# Patient Record
Sex: Female | Born: 1955
Health system: Southern US, Community
[De-identification: ages and names within clinical notes are randomized; demographics above are authoritative.]

## PROBLEM LIST (undated history)

## (undated) DIAGNOSIS — Z973 Presence of spectacles and contact lenses: Secondary | ICD-10-CM

## (undated) DIAGNOSIS — T7840XA Allergy, unspecified, initial encounter: Secondary | ICD-10-CM

## (undated) DIAGNOSIS — M6530 Trigger finger, unspecified finger: Secondary | ICD-10-CM

## (undated) DIAGNOSIS — F32A Depression, unspecified: Secondary | ICD-10-CM

## (undated) DIAGNOSIS — Z9889 Other specified postprocedural states: Secondary | ICD-10-CM

## (undated) DIAGNOSIS — K219 Gastro-esophageal reflux disease without esophagitis: Secondary | ICD-10-CM

## (undated) DIAGNOSIS — M1990 Unspecified osteoarthritis, unspecified site: Secondary | ICD-10-CM

## (undated) DIAGNOSIS — E7800 Pure hypercholesterolemia, unspecified: Secondary | ICD-10-CM

## (undated) DIAGNOSIS — K635 Polyp of colon: Secondary | ICD-10-CM

## (undated) DIAGNOSIS — Z780 Asymptomatic menopausal state: Secondary | ICD-10-CM

## (undated) DIAGNOSIS — F329 Major depressive disorder, single episode, unspecified: Secondary | ICD-10-CM

## (undated) DIAGNOSIS — R112 Nausea with vomiting, unspecified: Secondary | ICD-10-CM

## (undated) HISTORY — PX: COLONOSCOPY: SHX174

## (undated) HISTORY — DX: Pure hypercholesterolemia, unspecified: E78.00

## (undated) HISTORY — DX: Trigger finger, unspecified finger: M65.30

## (undated) HISTORY — DX: Allergy, unspecified, initial encounter: T78.40XA

## (undated) HISTORY — DX: Polyp of colon: K63.5

## (undated) HISTORY — PX: TOTAL ABDOMINAL HYSTERECTOMY: SHX209

## (undated) HISTORY — PX: OTHER SURGICAL HISTORY: SHX169

## (undated) HISTORY — DX: Asymptomatic menopausal state: Z78.0

## (undated) HISTORY — DX: Major depressive disorder, single episode, unspecified: F32.9

## (undated) HISTORY — DX: Depression, unspecified: F32.A

## (undated) HISTORY — PX: WISDOM TOOTH EXTRACTION: SHX21

## (undated) HISTORY — DX: Gastro-esophageal reflux disease without esophagitis: K21.9

---

## 1993-06-23 HISTORY — PX: FOOT ARTHRODESIS: SHX1655

## 1997-07-25 ENCOUNTER — Ambulatory Visit (HOSPITAL_COMMUNITY): Admission: RE | Admit: 1997-07-25 | Discharge: 1997-07-25 | Payer: Self-pay | Admitting: *Deleted

## 1998-03-15 ENCOUNTER — Ambulatory Visit (HOSPITAL_COMMUNITY): Admission: RE | Admit: 1998-03-15 | Discharge: 1998-03-15 | Payer: Self-pay | Admitting: Obstetrics and Gynecology

## 1998-06-04 ENCOUNTER — Ambulatory Visit (HOSPITAL_COMMUNITY): Admission: RE | Admit: 1998-06-04 | Discharge: 1998-06-04 | Payer: Self-pay | Admitting: Obstetrics and Gynecology

## 1998-07-23 ENCOUNTER — Encounter: Payer: Self-pay | Admitting: *Deleted

## 1998-07-23 ENCOUNTER — Inpatient Hospital Stay (HOSPITAL_COMMUNITY): Admission: AD | Admit: 1998-07-23 | Discharge: 1998-07-27 | Payer: Self-pay | Admitting: *Deleted

## 1998-07-27 ENCOUNTER — Encounter (HOSPITAL_COMMUNITY): Admission: RE | Admit: 1998-07-27 | Discharge: 1998-10-25 | Payer: Self-pay | Admitting: Unknown Physician Specialty

## 1998-09-12 ENCOUNTER — Other Ambulatory Visit: Admission: RE | Admit: 1998-09-12 | Discharge: 1998-09-12 | Payer: Self-pay | Admitting: Obstetrics and Gynecology

## 1999-07-01 ENCOUNTER — Ambulatory Visit (HOSPITAL_COMMUNITY): Admission: RE | Admit: 1999-07-01 | Discharge: 1999-07-01 | Payer: Self-pay | Admitting: Internal Medicine

## 1999-08-20 ENCOUNTER — Encounter: Admission: RE | Admit: 1999-08-20 | Discharge: 1999-08-20 | Payer: Self-pay | Admitting: Internal Medicine

## 1999-08-20 ENCOUNTER — Encounter: Payer: Self-pay | Admitting: Internal Medicine

## 1999-08-29 ENCOUNTER — Other Ambulatory Visit: Admission: RE | Admit: 1999-08-29 | Discharge: 1999-08-29 | Payer: Self-pay | Admitting: *Deleted

## 2000-11-24 ENCOUNTER — Other Ambulatory Visit: Admission: RE | Admit: 2000-11-24 | Discharge: 2000-11-24 | Payer: Self-pay | Admitting: *Deleted

## 2001-08-03 ENCOUNTER — Encounter: Payer: Self-pay | Admitting: Internal Medicine

## 2001-08-03 ENCOUNTER — Encounter: Admission: RE | Admit: 2001-08-03 | Discharge: 2001-08-03 | Payer: Self-pay | Admitting: Internal Medicine

## 2002-01-11 ENCOUNTER — Other Ambulatory Visit: Admission: RE | Admit: 2002-01-11 | Discharge: 2002-01-11 | Payer: Self-pay | Admitting: *Deleted

## 2002-09-07 ENCOUNTER — Ambulatory Visit (HOSPITAL_COMMUNITY): Admission: RE | Admit: 2002-09-07 | Discharge: 2002-09-07 | Payer: Self-pay | Admitting: Internal Medicine

## 2002-09-07 ENCOUNTER — Encounter: Payer: Self-pay | Admitting: Internal Medicine

## 2003-01-17 ENCOUNTER — Other Ambulatory Visit: Admission: RE | Admit: 2003-01-17 | Discharge: 2003-01-17 | Payer: Self-pay | Admitting: *Deleted

## 2003-06-24 HISTORY — PX: HYSTERECTOMY ABDOMINAL WITH SALPINGECTOMY: SHX6725

## 2003-06-24 HISTORY — PX: ABDOMINAL HYSTERECTOMY: SHX81

## 2003-08-28 ENCOUNTER — Encounter: Admission: RE | Admit: 2003-08-28 | Discharge: 2003-08-28 | Payer: Self-pay | Admitting: *Deleted

## 2004-01-01 ENCOUNTER — Other Ambulatory Visit: Admission: RE | Admit: 2004-01-01 | Discharge: 2004-01-01 | Payer: Self-pay | Admitting: *Deleted

## 2005-01-20 ENCOUNTER — Other Ambulatory Visit: Admission: RE | Admit: 2005-01-20 | Discharge: 2005-01-20 | Payer: Self-pay | Admitting: *Deleted

## 2005-01-20 ENCOUNTER — Ambulatory Visit (HOSPITAL_COMMUNITY): Admission: RE | Admit: 2005-01-20 | Discharge: 2005-01-20 | Payer: Self-pay | Admitting: *Deleted

## 2005-01-29 ENCOUNTER — Encounter: Admission: RE | Admit: 2005-01-29 | Discharge: 2005-01-29 | Payer: Self-pay | Admitting: *Deleted

## 2005-04-10 ENCOUNTER — Ambulatory Visit: Payer: Self-pay | Admitting: Internal Medicine

## 2005-04-15 ENCOUNTER — Ambulatory Visit: Payer: Self-pay | Admitting: Internal Medicine

## 2005-04-23 ENCOUNTER — Ambulatory Visit: Payer: Self-pay | Admitting: Internal Medicine

## 2006-02-24 ENCOUNTER — Ambulatory Visit (HOSPITAL_COMMUNITY): Admission: RE | Admit: 2006-02-24 | Discharge: 2006-02-24 | Payer: Self-pay | Admitting: *Deleted

## 2006-03-03 ENCOUNTER — Other Ambulatory Visit: Admission: RE | Admit: 2006-03-03 | Discharge: 2006-03-03 | Payer: Self-pay | Admitting: *Deleted

## 2007-03-25 ENCOUNTER — Other Ambulatory Visit: Admission: RE | Admit: 2007-03-25 | Discharge: 2007-03-25 | Payer: Self-pay | Admitting: *Deleted

## 2007-03-25 ENCOUNTER — Ambulatory Visit (HOSPITAL_COMMUNITY): Admission: RE | Admit: 2007-03-25 | Discharge: 2007-03-25 | Payer: Self-pay | Admitting: *Deleted

## 2008-05-05 ENCOUNTER — Ambulatory Visit (HOSPITAL_COMMUNITY): Admission: RE | Admit: 2008-05-05 | Discharge: 2008-05-05 | Payer: Self-pay | Admitting: Gynecology

## 2008-05-15 ENCOUNTER — Telehealth: Payer: Self-pay | Admitting: Internal Medicine

## 2008-05-15 ENCOUNTER — Other Ambulatory Visit: Admission: RE | Admit: 2008-05-15 | Discharge: 2008-05-15 | Payer: Self-pay | Admitting: Gynecology

## 2008-05-16 ENCOUNTER — Ambulatory Visit: Payer: Self-pay | Admitting: Internal Medicine

## 2008-05-25 ENCOUNTER — Telehealth: Payer: Self-pay | Admitting: Internal Medicine

## 2008-05-31 ENCOUNTER — Ambulatory Visit: Payer: Self-pay | Admitting: Internal Medicine

## 2009-03-06 ENCOUNTER — Emergency Department (HOSPITAL_COMMUNITY): Admission: EM | Admit: 2009-03-06 | Discharge: 2009-03-06 | Payer: Self-pay | Admitting: Family Medicine

## 2009-05-09 ENCOUNTER — Ambulatory Visit (HOSPITAL_COMMUNITY): Admission: RE | Admit: 2009-05-09 | Discharge: 2009-05-09 | Payer: Self-pay | Admitting: Gynecology

## 2010-07-21 ENCOUNTER — Ambulatory Visit
Admission: RE | Admit: 2010-07-21 | Discharge: 2010-07-21 | Payer: Self-pay | Source: Home / Self Care | Admitting: Emergency Medicine

## 2010-07-21 DIAGNOSIS — J209 Acute bronchitis, unspecified: Secondary | ICD-10-CM | POA: Insufficient documentation

## 2010-07-31 NOTE — Assessment & Plan Note (Signed)
Summary: COLD/TM room 4   Vital Signs:  Patient Profile:   55 Years Old Female CC:      Cold & URI symptoms Height:     66 inches Weight:      144.50 pounds O2 Sat:      100 % O2 treatment:    Room Air Temp:     98.7 degrees F oral Pulse rate:   81 / minute Resp:     16 per minute BP sitting:   111 / 74  (left arm) Cuff size:   regular  Vitals Entered By: Clemens Catholic LPN (July 21, 2010 11:29 AM)                  Updated Prior Medication List: VIVELLE-DOT 0.0375 MG/24HR PTTW (ESTRADIOL)  CITALOPRAM HYDROBROMIDE 20 MG TABS (CITALOPRAM HYDROBROMIDE)   Current Allergies: ! PENICILLINHistory of Present Illness Chief Complaint: Cold & URI symptoms History of Present Illness: 55 Years Old Female complains of onset of cold symptoms for 12  days.  Joanna Willis has been using Zyrtec which is helping a little bit.  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  Her husband had pneumonia recently. No sore throat + cough No pleuritic pain No wheezing No nasal congestion + post-nasal drainage No sinus pain/pressure + chest congestion No itchy/red eyes No earache No hemoptysis No SOB No chills/sweats No fever No nausea No vomiting No abdominal pain No diarrhea No skin rashes No fatigue No myalgias + headache   REVIEW OF SYSTEMS Constitutional Symptoms       Complains of fever, chills, and night sweats.     Denies weight loss, weight gain, and fatigue.  Eyes       Denies change in vision, eye pain, eye discharge, glasses, contact lenses, and eye surgery. Ear/Nose/Throat/Mouth       Complains of sinus problems and hoarseness.      Denies hearing loss/aids, change in hearing, ear pain, ear discharge, dizziness, frequent runny nose, frequent nose bleeds, sore throat, and tooth pain or bleeding.  Respiratory       Complains of productive cough and bronchitis.      Denies dry cough, wheezing, shortness of breath, asthma, and emphysema/COPD.  Cardiovascular       Complains of tires  easily with exhertion.      Denies murmurs and chest pain.    Gastrointestinal       Denies stomach pain, nausea/vomiting, diarrhea, constipation, blood in bowel movements, and indigestion. Genitourniary       Denies painful urination, kidney stones, and loss of urinary control. Neurological       Complains of headaches.      Denies paralysis, seizures, and fainting/blackouts. Musculoskeletal       Denies muscle pain, joint pain, joint stiffness, decreased range of motion, redness, swelling, muscle weakness, and gout.  Skin       Denies bruising, unusual mles/lumps or sores, and hair/skin or nail changes.  Psych       Denies mood changes, temper/anger issues, anxiety/stress, speech problems, depression, and sleep problems. Other Comments: pt states that she has had a productive cough, HA x 12days. she had a fever last Saturday and Sunday, nofever since then. no sleeping. she is taking zyrtec and Robt   Past History:  Past Medical History: Unremarkable  Past Surgical History: Caesarean section Hysterectomy  Family History: CA CAD  Social History: Never Smoked Alcohol use-yes Drug use-no Smoking Status:  never Drug Use:  no Physical Exam  General appearance: well developed, well nourished, no acute distress other than coughing during the exam Ears: normal, no lesions or deformities Nasal: mucosa pink, nonedematous, no septal deviation, turbinates normal Oral/Pharynx: clear PND, otherwise normal Neck: neck supple,  trachea midline, no masses Chest/Lungs: no rales, wheezes, or rhonchi bilateral, breath sounds equal without effort Heart: regular rate and  rhythm, no murmur Skin: no obvious rashes or lesions MSE: oriented to time, place, and person Assessment New Problems: BRONCHITIS, ACUTE (ICD-466.0)   Patient Education: Patient and/or caregiver instructed in the following: rest, fluids.  Plan New Medications/Changes: ZITHROMAX Z-PAK 250 MG TABS (AZITHROMYCIN) use  as directed  #1 x 0, 07/21/2010, Hoyt Koch MD TESSALON 200 MG CAPS (BENZONATATE) 1 by mouth three times a day as needed cough  #24 x 0, 07/21/2010, Hoyt Koch MD  New Orders: New Patient Level III [phone] Pulse Oximetry (single measurment) [94760] Planning Comments:   1)  Take the prescribed antibiotic as instructed. 2)  Use nasal saline solution (over the counter) at least 3 times a day. 3)  Use over the counter decongestants like Zyrtec-D every 12 hours as needed to help with congestion.  Can also use Delsym or Robitussin if the Tessalon isn't good enough. 4)  Can take tylenol every 6 hours or motrin every 8 hours for pain or fever. 5)  Follow up with your primary doctor  if no improvement in 5-7 days, sooner if increasing pain, fever, or new symptoms.   If getting worse or not better, can try a Rx cough syrup, Prednisone, or get a CXR.   The patient and/or caregiver has been counseled thoroughly with regard to medications prescribed including dosage, schedule, interactions, rationale for use, and possible side effects and they verbalize understanding.  Diagnoses and expected course of recovery discussed and will return if not improved as expected or if the condition worsens. Patient and/or caregiver verbalized understanding.  Prescriptions: ZITHROMAX Z-PAK 250 MG TABS (AZITHROMYCIN) use as directed  #1 x 0   Entered and Authorized by:   Hoyt Koch MD   Signed by:   Hoyt Koch MD on 07/21/2010   Method used:   Print then Give to Patient   RxID:   [id number] TESSALON 200 MG CAPS (BENZONATATE) 1 by mouth three times a day as needed cough  #24 x 0   Entered and Authorized by:   Hoyt Koch MD   Signed by:   Hoyt Koch MD on 07/21/2010   Method used:   Print then Give to Patient   RxID:   [id number]   Orders Added: 1)  New Patient Level III [29528] 2)  Pulse Oximetry (single measurment) [41324]

## 2010-09-27 LAB — POCT URINALYSIS DIP (DEVICE)
Glucose, UA: NEGATIVE mg/dL
Ketones, ur: NEGATIVE mg/dL
Specific Gravity, Urine: 1.015 (ref 1.005–1.030)
Urobilinogen, UA: 0.2 mg/dL (ref 0.0–1.0)
pH: 7 (ref 5.0–8.0)

## 2010-09-27 LAB — URINE CULTURE

## 2013-03-17 ENCOUNTER — Encounter: Payer: Self-pay | Admitting: Internal Medicine

## 2013-11-19 ENCOUNTER — Encounter: Payer: Self-pay | Admitting: Internal Medicine

## 2013-11-28 ENCOUNTER — Encounter: Payer: Self-pay | Admitting: Internal Medicine

## 2014-02-17 ENCOUNTER — Encounter: Payer: Self-pay | Admitting: Internal Medicine

## 2014-03-10 ENCOUNTER — Ambulatory Visit (AMBULATORY_SURGERY_CENTER): Payer: 59

## 2014-03-10 VITALS — Ht 65.5 in | Wt 153.0 lb

## 2014-03-10 DIAGNOSIS — Z800 Family history of malignant neoplasm of digestive organs: Secondary | ICD-10-CM

## 2014-03-10 MED ORDER — MOVIPREP 100 G PO SOLR: 1.0000 | Freq: Once | ORAL | Status: DC

## 2014-03-10 NOTE — Progress Notes (Signed)
No allergies to eggs or soy No past problems with anesthesia EXCEPT PONV No home oxygen No diet/weight loss meds  Has email  Emmi instructions given for colonoscopy

## 2014-03-24 ENCOUNTER — Ambulatory Visit (AMBULATORY_SURGERY_CENTER): Payer: 59 | Admitting: Internal Medicine

## 2014-03-24 ENCOUNTER — Encounter: Payer: Self-pay | Admitting: Internal Medicine

## 2014-03-24 VITALS — BP 116/70 | HR 60 | Temp 96.2°F | Resp 22 | Ht 65.5 in | Wt 153.0 lb

## 2014-03-24 DIAGNOSIS — Z800 Family history of malignant neoplasm of digestive organs: Secondary | ICD-10-CM

## 2014-03-24 DIAGNOSIS — Z1211 Encounter for screening for malignant neoplasm of colon: Secondary | ICD-10-CM

## 2014-03-24 MED ORDER — SODIUM CHLORIDE 0.9 % IV SOLN: 500.0000 mL | INTRAVENOUS | Status: DC

## 2014-03-24 NOTE — Patient Instructions (Signed)
YOU HAD AN ENDOSCOPIC PROCEDURE TODAY AT [facility]: Refer to the procedure report that was given to you for any specific questions about what was found during the examination.  If the procedure report does not answer your questions, please call your gastroenterologist to clarify.  If you requested that your care partner not be given the details of your procedure findings, then the procedure report has been included in a sealed envelope for you to review at your convenience later.  YOU SHOULD EXPECT: Some feelings of bloating in the abdomen. Passage of more gas than usual.  Walking can help get rid of the air that was put into your GI tract during the procedure and reduce the bloating. If you had a lower endoscopy (such as a colonoscopy or flexible sigmoidoscopy) you may notice spotting of blood in your stool or on the toilet paper. If you underwent a bowel prep for your procedure, then you may not have a normal bowel movement for a few days.  DIET: Your first meal following the procedure should be a light meal and then it is ok to progress to your normal diet.  A half-sandwich or bowl of soup is an example of a good first meal.  Heavy or fried foods are harder to digest and may make you feel nauseous or bloated.  Likewise meals heavy in dairy and vegetables can cause extra gas to form and this can also increase the bloating.  Drink plenty of fluids but you should avoid alcoholic beverages for 24 hours.Try to eat a high fiber diet.  ACTIVITY: Your care partner should take you home directly after the procedure.  You should plan to take it easy, moving slowly for the rest of the day.  You can resume normal activity the day after the procedure however you should NOT DRIVE or use heavy machinery for 24 hours (because of the sedation medicines used during the test).    SYMPTOMS TO REPORT IMMEDIATELY: A gastroenterologist can be reached at any hour.  During normal business hours, 8:30 AM to 5:00  PM Monday through Friday, call [phone].  After hours and on weekends, please call the GI answering service at [phone] who will take a message and have the physician on call contact you.   Following lower endoscopy (colonoscopy or flexible sigmoidoscopy):  Excessive amounts of blood in the stool  Significant tenderness or worsening of abdominal pains  Swelling of the abdomen that is new, acute  Fever of 100F or higher  FOLLOW UP: If any biopsies were taken you will be contacted by phone or by letter within the next 1-3 weeks.  Call your gastroenterologist if you have not heard about the biopsies in 3 weeks.  Our staff will call the home number listed on your records the next business day following your procedure to check on you and address any questions or concerns that you may have at that time regarding the information given to you following your procedure. This is a courtesy call and so if there is no answer at the home number and we have not heard from you through the emergency physician on call, we will assume that you have returned to your regular daily activities without incident.  SIGNATURES/CONFIDENTIALITY: You and/or your care partner have signed paperwork which will be entered into your electronic medical record.  These signatures attest to the fact that that the information above on your After Visit Summary has been reviewed and is understood.  Full  responsibility of the confidentiality of this discharge information lies with you and/or your care-partner.

## 2014-03-24 NOTE — Progress Notes (Signed)
Report to PACU, RN, vss, BBS= Clear.  

## 2014-03-24 NOTE — Op Note (Signed)
[place]  [address] [place], [id number]   COLONOSCOPY PROCEDURE REPORT  PATIENT: Joanna Willis, Joanna Willis  MR#: [id number] BIRTHDATE: 04-23-1956 , 89  yrs. old GENDER: female ENDOSCOPIST: Lafayette Dragon, MD REFERRED BY:Dr Gildardo Cranker PROCEDURE DATE:  03/24/2014 PROCEDURE:   Colonoscopy, screening First Screening Colonoscopy - Avg.  risk and is 50 yrs.  old or older - No.  Prior Negative Screening - Now for repeat screening. 10 or more years since last screening Prior Negative Screening - Now for repeat screening.  Less than 10 yrs Prior Negative Screening - Now for repeat screening.  Above average risk  History of Adenoma - Now for follow-up colonoscopy & has been > or = to 3 yrs.  N/A  Polyps Removed Today? No. ASA CLASS:   Class I INDICATIONS:patient's immediate family history of colon cancer and mother with colon cancer.  Prior colonoscopies in 1995, 2001 and 2009. MEDICATIONS: Monitored anesthesia care and Propofol 400 mg IV  DESCRIPTION OF PROCEDURE:   After the risks benefits and alternatives of the procedure were thoroughly explained, informed consent was obtained.  The digital rectal exam revealed no abnormalities of the rectum.   The LB [id number]  endoscope was introduced through the anus and advanced to the cecum, which was identified by both the appendix and ileocecal valve. No adverse events experienced.   The quality of the prep was good, using MoviPrep  The instrument was then slowly withdrawn as the colon was fully examined.      COLON FINDINGS: A normal appearing cecum, ileocecal valve, and appendiceal orifice were identified.  The ascending, transverse, descending, sigmoid colon, and rectum appeared unremarkable. Retroflexed views revealed no abnormalities. The time to cecum=27 minutes 08 seconds.  Withdrawal time=603 minutes 0 seconds.  The scope was withdrawn and the procedure completed. COMPLICATIONS: There were no immediate  complications.  ENDOSCOPIC IMPRESSION: Normal colonoscopy  RECOMMENDATIONS: High fiber diet Recall colonoscopy in 5 years  eSigned:  Lafayette Dragon, MD 03/24/2014 10:39 AM   cc:   PATIENT NAME:  Denim, Start MR#: [id number]

## 2014-03-27 ENCOUNTER — Telehealth: Payer: Self-pay | Admitting: *Deleted

## 2014-03-27 NOTE — Telephone Encounter (Signed)
  Follow up Call-  Call back number 03/24/2014  Post procedure Call Back phone  # [phone]  Permission to leave phone message Yes     Patient questions:  Do you have a fever, pain , or abdominal swelling? No. Pain Score  0 *  Have you tolerated food without any problems? Yes.    Have you been able to return to your normal activities? Yes.    Do you have any questions about your discharge instructions: Diet   No. Medications  No. Follow up visit  No.  Do you have questions or concerns about your Care? No.  Actions: * If pain score is 4 or above: No action needed, pain <4.

## 2014-04-14 ENCOUNTER — Encounter (HOSPITAL_BASED_OUTPATIENT_CLINIC_OR_DEPARTMENT_OTHER): Payer: Self-pay | Admitting: Emergency Medicine

## 2014-04-14 ENCOUNTER — Emergency Department (HOSPITAL_BASED_OUTPATIENT_CLINIC_OR_DEPARTMENT_OTHER)
Admission: EM | Admit: 2014-04-14 | Discharge: 2014-04-14 | Disposition: A | Discharge status: Home / Self Care | Payer: 59 | Attending: Emergency Medicine | Admitting: Emergency Medicine

## 2014-04-14 DIAGNOSIS — Z880 Allergy status to penicillin: Secondary | ICD-10-CM | POA: Insufficient documentation

## 2014-04-14 DIAGNOSIS — Z780 Asymptomatic menopausal state: Secondary | ICD-10-CM | POA: Insufficient documentation

## 2014-04-14 DIAGNOSIS — Y9389 Activity, other specified: Secondary | ICD-10-CM | POA: Insufficient documentation

## 2014-04-14 DIAGNOSIS — S8992XA Unspecified injury of left lower leg, initial encounter: Secondary | ICD-10-CM | POA: Diagnosis present

## 2014-04-14 DIAGNOSIS — E780 Pure hypercholesterolemia: Secondary | ICD-10-CM | POA: Diagnosis not present

## 2014-04-14 DIAGNOSIS — Y9289 Other specified places as the place of occurrence of the external cause: Secondary | ICD-10-CM | POA: Diagnosis not present

## 2014-04-14 DIAGNOSIS — X58XXXA Exposure to other specified factors, initial encounter: Secondary | ICD-10-CM | POA: Diagnosis not present

## 2014-04-14 DIAGNOSIS — F329 Major depressive disorder, single episode, unspecified: Secondary | ICD-10-CM | POA: Insufficient documentation

## 2014-04-14 DIAGNOSIS — K219 Gastro-esophageal reflux disease without esophagitis: Secondary | ICD-10-CM | POA: Diagnosis not present

## 2014-04-14 DIAGNOSIS — Z79899 Other long term (current) drug therapy: Secondary | ICD-10-CM | POA: Insufficient documentation

## 2014-04-14 DIAGNOSIS — S86912A Strain of unspecified muscle(s) and tendon(s) at lower leg level, left leg, initial encounter: Secondary | ICD-10-CM | POA: Diagnosis not present

## 2014-04-14 MED ORDER — HYDROCODONE-ACETAMINOPHEN 5-325 MG PO TABS: ORAL_TABLET | ORAL | Status: DC

## 2014-04-14 MED ORDER — METHOCARBAMOL 500 MG PO TABS: 1000.0000 mg | ORAL_TABLET | Freq: Four times a day (QID) | ORAL | Status: DC | PRN

## 2014-04-14 NOTE — ED Notes (Signed)
Left knee pain. This am her leg gave out while walking.

## 2014-04-14 NOTE — ED Provider Notes (Signed)
CSN: [id number]     Arrival date & time 04/14/14  1134 History   First MD Initiated Contact with Patient 04/14/14 1214     Chief Complaint  Patient presents with  . Knee Pain     (Consider location/radiation/quality/duration/timing/severity/associated sxs/prior Treatment) HPI  Joanna Willis is a 58 y.o. female complaining of acute onset of left knee pain. Patient states that she has issues with her niece, she no longer drugs because they swell and give her pain when she does. She was at her daughter's ray several days ago and was jogging to see her at various weight points. She had severe pain after the fact. She was ambulating with a cane. She's taking Motrin and icing it with little relief. Patient was walking on uneven ground today and while she was standing she heard a pop in the medial posterior area, felt severe pain she's been nonambulatory since that time. No pain medication taken prior to arrival. She did not fall. There's been no direct trauma. She denies numbness, weakness or paresthesia. Pain is severe, 8/10 it is exacerbated when going from sitting to standing and straightening the knee. No prior history of trauma or surgery to the affected joint.  Past Medical History  Diagnosis Date  . GERD (gastroesophageal reflux disease)   . Hypercholesteremia   . Depression   . Postmenopausal    Past Surgical History  Procedure Laterality Date  . Abdominal hysterectomy      did not remove ovaries  . Cesarean section     Family History  Problem Relation Age of Onset  . Pancreatic cancer Neg Hx   . Stomach cancer Neg Hx   . Colon cancer Mother 25    lived 6 months after diagnosis   History  Substance Use Topics  . Smoking status: Never Smoker   . Smokeless tobacco: Never Used  . Alcohol Use: 4.2 oz/week    7 Cans of beer per week   OB History   Grav Para Term Preterm Abortions TAB SAB Ect Mult Living                 Review of Systems  10 systems reviewed and found to  be negative, except as noted in the HPI.  Allergies  Penicillins  Home Medications   Prior to Admission medications   Medication Sig Start Date End Date Taking? Authorizing Provider  citalopram (CELEXA) 20 MG tablet Take 20 mg by mouth daily.    Historical Provider, MD  estradiol (VIVELLE-DOT) 0.05 MG/24HR patch Place 1 patch onto the skin 2 (two) times a week.    Historical Provider, MD  HYDROcodone-acetaminophen (NORCO/VICODIN) 5-325 MG per tablet Take 1-2 tablets by mouth every 6 hours as needed for pain. 04/14/14   Kamariyah Timberlake, PA-C  methocarbamol (ROBAXIN) 500 MG tablet Take 2 tablets (1,000 mg total) by mouth 4 (four) times daily as needed (Pain). 04/14/14   Simone Tuckey, PA-C  omeprazole (PRILOSEC) 20 MG capsule Take 20 mg by mouth daily.    Historical Provider, MD  simvastatin (ZOCOR) 10 MG tablet Take 10 mg by mouth daily at 6 PM.    Historical Provider, MD   BP 126/81  Pulse 69  Temp(Src) 98.6 F (37 C) (Oral)  Resp 18  Ht 5\' 6"  (1.676 m)  Wt 153 lb (69.4 kg)  BMI 24.71 kg/m2  SpO2 100% Physical Exam  Nursing note and vitals reviewed. Constitutional: She is oriented to person, place, and time. She appears well-developed and well-nourished.  No distress.  HENT:  Head: Normocephalic.  Eyes: Conjunctivae and EOM are normal.  Cardiovascular: Normal rate.   Pulmonary/Chest: Effort normal. No stridor.  Musculoskeletal: Normal range of motion.       Legs: Right knee:  No deformity, erythema or abrasions. FROM. No effusion or crepitance. Anterior and posterior drawer show no abnormal laxity. Stable to valgus and varus stress. Joint lines are non-tender. Neurovascularly intact. Pt ambulates with non-antalgic gait.    Neurological: She is alert and oriented to person, place, and time.  Psychiatric: She has a normal mood and affect.    ED Course  Procedures (including critical care time) Labs Review Labs Reviewed - No data to display  Imaging Review No results  found.   EKG Interpretation None      MDM   Final diagnoses:  Knee strain, left, initial encounter    Filed Vitals:   04/14/14 1157  BP: 126/81  Pulse: 69  Temp: 98.6 F (37 C)  TempSrc: Oral  Resp: 18  Height: 5\' 6"  (1.676 m)  Weight: 153 lb (69.4 kg)  SpO2: 100%    Joanna Willis is a 58 y.o. female presenting with acute onset of posterior medial left knee pain. Patient has excellent active range of motion. The knee joint itself appears intact and stable, there was no trauma, no indication for imaging at this point. She has a torn muscle at the insertion of the posterior thigh musculature. Patient will be put in an immobilizer, recommended NSAIDs, muscle relaxers and narcotics for pain control. Patient declines pain medication here.  Evaluation does not show pathology that would require ongoing emergent intervention or inpatient treatment. Pt is hemodynamically stable and mentating appropriately. Discussed findings and plan with patient/guardian, who agrees with care plan. All questions answered. Return precautions discussed and outpatient follow up given.   New Prescriptions   HYDROCODONE-ACETAMINOPHEN (NORCO/VICODIN) 5-325 MG PER TABLET    Take 1-2 tablets by mouth every 6 hours as needed for pain.   METHOCARBAMOL (ROBAXIN) 500 MG TABLET    Take 2 tablets (1,000 mg total) by mouth 4 (four) times daily as needed (Pain).         Monico Blitz, PA-C 04/14/14 1258

## 2014-04-14 NOTE — Discharge Instructions (Signed)
Rest, Ice intermittently (in the first 24-48 hours), Gentle compression with an Ace wrap, and elevate (Limb above the level of the heart)   Take up to 800mg  of ibuprofen (that is usually 4 over the counter pills)  3 times a day for 5 days. Take with food.  Take robaxin and/or Vicodin for breakthrough pain, do not drink alcohol, drive, care for children or perfom other critical tasks while taking robaxin and/or Vicodin .  Please follow with your primary care doctor in the next 2 days for a check-up. They must obtain records for further management.   Do not hesitate to return to the Emergency Department for any new, worsening or concerning symptoms.

## 2014-04-15 NOTE — ED Provider Notes (Signed)
Medical screening examination/treatment/procedure(s) were performed by non-physician practitioner and as supervising physician I was immediately available for consultation/collaboration.   EKG Interpretation None        Fredia Sorrow, MD 04/15/14 1524

## 2014-04-17 ENCOUNTER — Ambulatory Visit (INDEPENDENT_AMBULATORY_CARE_PROVIDER_SITE_OTHER): Payer: 59 | Admitting: Family Medicine

## 2014-04-17 ENCOUNTER — Encounter: Payer: Self-pay | Admitting: Family Medicine

## 2014-04-17 VITALS — BP 123/80 | HR 71 | Ht 66.0 in | Wt 145.0 lb

## 2014-04-17 DIAGNOSIS — M25562 Pain in left knee: Secondary | ICD-10-CM

## 2014-04-17 NOTE — Patient Instructions (Signed)
You have strained your medial hamstring tendon. Start with home exercises 3 sets of 10 once a day - wait about a week to do the running lunge one though. Add ankle weight if these become too easy. Icing 15 minutes at a time 3-4 times a day. Ibuprofen 600mg  three times a day with food OR aleve 2 tabs twice a day with food for pain and inflammation. Knee sleeve for support, compression when up and walking around. Consider physical therapy - call me in a couple weeks if you want to do this. Follow up with me in 1 month.

## 2014-04-19 ENCOUNTER — Encounter: Payer: Self-pay | Admitting: Family Medicine

## 2014-04-19 DIAGNOSIS — M25562 Pain in left knee: Secondary | ICD-10-CM | POA: Insufficient documentation

## 2014-04-19 NOTE — Progress Notes (Signed)
Patient ID: PUANANI GENE, female   DOB: 08/11/1955, 58 y.o.   MRN: [id number]  PCP: Gildardo Cranker, MD  Subjective:   HPI: Patient is a 58 y.o. female here for left knee pain.  Patient reports on 10/21 she was running to different locations at a cross country meet to watch her daughter. No acute injury with this. Then later that night she cleaned up dishes, sat don for 20 minutes and had a lot of pain in left knee, difficulty bearing weight. Pain medial posterior part of left knee. Did feel/hear a snap on Friday on inside of knee in this area. No bruising or swelling. Has been icing and taking advil. No catching, locking, giving out.  Past Medical History  Diagnosis Date  . GERD (gastroesophageal reflux disease)   . Hypercholesteremia   . Depression   . Postmenopausal     Current Outpatient Prescriptions on File Prior to Visit  Medication Sig Dispense Refill  . citalopram (CELEXA) 20 MG tablet Take 20 mg by mouth daily.      Marland Kitchen estradiol (VIVELLE-DOT) 0.05 MG/24HR patch Place 1 patch onto the skin 2 (two) times a week.      Marland Kitchen HYDROcodone-acetaminophen (NORCO/VICODIN) 5-325 MG per tablet Take 1-2 tablets by mouth every 6 hours as needed for pain.  15 tablet  0  . methocarbamol (ROBAXIN) 500 MG tablet Take 2 tablets (1,000 mg total) by mouth 4 (four) times daily as needed (Pain).  20 tablet  0  . omeprazole (PRILOSEC) 20 MG capsule Take 20 mg by mouth daily.      . simvastatin (ZOCOR) 10 MG tablet Take 10 mg by mouth daily at 6 PM.       No current facility-administered medications on file prior to visit.    Past Surgical History  Procedure Laterality Date  . Abdominal hysterectomy      did not remove ovaries  . Cesarean section      Allergies  Allergen Reactions  . Penicillins     REACTION: rash    History   Social History  . Marital Status: Married    Spouse Name: N/A    Number of Children: N/A  . Years of Education: N/A   Occupational History  . Not on file.    Social History Main Topics  . Smoking status: Never Smoker   . Smokeless tobacco: Never Used  . Alcohol Use: 4.2 oz/week    7 Cans of beer per week  . Drug Use: No  . Sexual Activity: Not on file   Other Topics Concern  . Not on file   Social History Narrative  . No narrative on file    Family History  Problem Relation Age of Onset  . Pancreatic cancer Neg Hx   . Stomach cancer Neg Hx   . Colon cancer Mother 27    lived 6 months after diagnosis    BP 123/80  Pulse 71  Ht 5\' 6"  (1.676 m)  Wt 145 lb (65.772 kg)  BMI 23.41 kg/m2  Review of Systems: See HPI above.    Objective:  Physical Exam:  Gen: NAD  Left knee: No gross deformity, ecchymoses, swelling. TTP medial hamstring tendon near insertion.  No other tenderness. FROM - pain with resisted hamstring flexion at 30 degrees with 4+/5 strength compared to right. Negative ant/post drawers. Negative valgus/varus testing. Negative lachmanns. Negative mcmurrays, apleys, patellar apprehension. NV intact distally.    Assessment & Plan:  1. Left knee pain - consistent with  strain of medial hamstring tendon.  Start with home exercise program.  Icing, nsaids.  Sleeve for support.  Consider physical therapy if not improving as expected.  F/u in 1 month.

## 2014-04-19 NOTE — Assessment & Plan Note (Signed)
consistent with strain of medial hamstring tendon.  Start with home exercise program.  Icing, nsaids.  Sleeve for support.  Consider physical therapy if not improving as expected.  F/u in 1 month.

## 2014-04-26 ENCOUNTER — Encounter: Payer: Self-pay | Admitting: Family Medicine

## 2014-04-26 ENCOUNTER — Ambulatory Visit (INDEPENDENT_AMBULATORY_CARE_PROVIDER_SITE_OTHER): Payer: 59 | Admitting: Family Medicine

## 2014-04-26 VITALS — BP 145/91 | HR 74 | Ht 66.0 in | Wt 145.0 lb

## 2014-04-26 DIAGNOSIS — M25562 Pain in left knee: Secondary | ICD-10-CM

## 2014-04-26 MED ORDER — METHYLPREDNISOLONE ACETATE 40 MG/ML IJ SUSP
40.0000 mg | Freq: Once | INTRAMUSCULAR | Status: AC
  Administered 2014-04-26: 40 mg via INTRA_ARTICULAR

## 2014-04-28 NOTE — Progress Notes (Addendum)
Patient ID: Joanna Willis, female   DOB: 04/21/1956, 58 y.o.   MRN: [id number]  PCP: Gildardo Cranker, MD  Subjective:   HPI: Patient is a 58 y.o. female here for left knee pain.  10/26: Patient reports on 10/21 she was running to different locations at a cross country meet to watch her daughter. No acute injury with this. Then later that night she cleaned up dishes, sat don for 20 minutes and had a lot of pain in left knee, difficulty bearing weight. Pain medial posterior part of left knee. Did feel/hear a snap on Friday on inside of knee in this area. No bruising or swelling. Has been icing and taking advil. No catching, locking, giving out.  11/4: Patient reports her pain has continued and bothering below knee.   Pain up to 5/10. Hurts with ambulation mainly. + swelling. Icing and taking advil, wearing knee sleeve.  Past Medical History  Diagnosis Date  . GERD (gastroesophageal reflux disease)   . Hypercholesteremia   . Depression   . Postmenopausal     Current Outpatient Prescriptions on File Prior to Visit  Medication Sig Dispense Refill  . citalopram (CELEXA) 20 MG tablet Take 20 mg by mouth daily.    Marland Kitchen estradiol (VIVELLE-DOT) 0.05 MG/24HR patch Place 1 patch onto the skin 2 (two) times a week.    Marland Kitchen HYDROcodone-acetaminophen (NORCO/VICODIN) 5-325 MG per tablet Take 1-2 tablets by mouth every 6 hours as needed for pain. 15 tablet 0  . methocarbamol (ROBAXIN) 500 MG tablet Take 2 tablets (1,000 mg total) by mouth 4 (four) times daily as needed (Pain). 20 tablet 0  . omeprazole (PRILOSEC) 20 MG capsule Take 20 mg by mouth daily.    . simvastatin (ZOCOR) 10 MG tablet Take 10 mg by mouth daily at 6 PM.     No current facility-administered medications on file prior to visit.    Past Surgical History  Procedure Laterality Date  . Abdominal hysterectomy      did not remove ovaries  . Cesarean section      Allergies  Allergen Reactions  . Penicillins     REACTION:  rash    History   Social History  . Marital Status: Married    Spouse Name: N/A    Number of Children: N/A  . Years of Education: N/A   Occupational History  . Not on file.   Social History Main Topics  . Smoking status: Never Smoker   . Smokeless tobacco: Never Used  . Alcohol Use: 4.2 oz/week    7 Cans of beer per week  . Drug Use: No  . Sexual Activity: Not on file   Other Topics Concern  . Not on file   Social History Narrative    Family History  Problem Relation Age of Onset  . Pancreatic cancer Neg Hx   . Stomach cancer Neg Hx   . Colon cancer Mother 28    lived 6 months after diagnosis    BP 145/91 mmHg  Pulse 74  Ht 5\' 6"  (1.676 m)  Wt 145 lb (65.772 kg)  BMI 23.41 kg/m2  Review of Systems: See HPI above.    Objective:  Physical Exam:  Gen: NAD  Left knee: Mild effusion.  No other gross deformity, ecchymoses. TTP medial joint line.  No longer with TTP hamstring tendon near insertion.  No other tenderness. FROM - no pain with resisted hamstring flexion at 30 and 90 degrees. Negative ant/post drawers. Negative valgus/varus testing. Negative lachmanns.  Negative mcmurrays, apleys, patellar apprehension. NV intact distally.    Assessment & Plan:  1. Left knee pain - consistent with likely flare of arthritis vs degenerative medial meniscus tear.  Both treated similarly initially.  Discussed tylenol, nsaids, capsaicin, glucosamine.  Injection given today.  Discussed HEP.  Consider physical therapy, radiographs with MRI if not improving.  F/u in 1 month to 6 weeks.  After informed written consent, patient was lying supine on exam table. Left knee was prepped with alcohol swab and utilizing superolateral approach with ultrasound guidance, patient's left knee was injected intraarticularly with 3:1 marcaine: depomedrol. Patient tolerated the procedure well without immediate complications.  Addendum:  MRI reviewed and discussed with patient.  She does have a  medial meniscus tear with extrusion, also with some small loose bodies.  Given lack of response to conservative measures and degree of discomfort she is in with these findings will refer to Dr. Berenice Primas to likely undergo arthroscopic debridement.

## 2014-04-28 NOTE — Assessment & Plan Note (Signed)
consistent with likely flare of arthritis vs degenerative medial meniscus tear.  Both treated similarly initially.  Discussed tylenol, nsaids, capsaicin, glucosamine.  Injection given today.  Discussed HEP.  Consider physical therapy, radiographs with MRI if not improving.  F/u in 1 month to 6 weeks.  After informed written consent, patient was lying supine on exam table. Left knee was prepped with alcohol swab and utilizing superolateral approach with ultrasound guidance, patient's left knee was injected intraarticularly with 3:1 marcaine: depomedrol. Patient tolerated the procedure well without immediate complications.

## 2014-05-02 ENCOUNTER — Telehealth: Payer: Self-pay | Admitting: Family Medicine

## 2014-05-02 NOTE — Telephone Encounter (Signed)
Ok to go ahead with MRI of left knee for meniscus tear.  She has [facility].  Thanks!

## 2014-05-02 NOTE — Addendum Note (Signed)
Addended by: Sherrie George F on: 05/02/2014 10:45 AM   Modules accepted: Orders

## 2014-05-06 ENCOUNTER — Ambulatory Visit (HOSPITAL_BASED_OUTPATIENT_CLINIC_OR_DEPARTMENT_OTHER)
Admission: RE | Admit: 2014-05-06 | Discharge: 2014-05-06 | Disposition: A | Discharge status: Home / Self Care | Payer: 59 | Source: Ambulatory Visit | Attending: Family Medicine | Admitting: Family Medicine

## 2014-05-06 DIAGNOSIS — M25462 Effusion, left knee: Secondary | ICD-10-CM | POA: Insufficient documentation

## 2014-05-06 DIAGNOSIS — S83222A Peripheral tear of medial meniscus, current injury, left knee, initial encounter: Secondary | ICD-10-CM | POA: Insufficient documentation

## 2014-05-06 DIAGNOSIS — M179 Osteoarthritis of knee, unspecified: Secondary | ICD-10-CM | POA: Insufficient documentation

## 2014-05-06 DIAGNOSIS — M67462 Ganglion, left knee: Secondary | ICD-10-CM | POA: Insufficient documentation

## 2014-05-06 DIAGNOSIS — M25562 Pain in left knee: Secondary | ICD-10-CM

## 2014-05-06 DIAGNOSIS — M2342 Loose body in knee, left knee: Secondary | ICD-10-CM | POA: Diagnosis not present

## 2014-05-06 DIAGNOSIS — M7122 Synovial cyst of popliteal space [Baker], left knee: Secondary | ICD-10-CM | POA: Diagnosis not present

## 2014-05-06 DIAGNOSIS — M6752 Plica syndrome, left knee: Secondary | ICD-10-CM | POA: Diagnosis not present

## 2014-05-06 DIAGNOSIS — S83207A Unspecified tear of unspecified meniscus, current injury, left knee, initial encounter: Secondary | ICD-10-CM | POA: Diagnosis present

## 2014-05-06 DIAGNOSIS — R609 Edema, unspecified: Secondary | ICD-10-CM | POA: Diagnosis not present

## 2014-05-08 NOTE — Addendum Note (Signed)
Addended by: Sherrie George F on: 05/08/2014 04:13 PM   Modules accepted: Orders

## 2014-05-12 ENCOUNTER — Encounter (HOSPITAL_BASED_OUTPATIENT_CLINIC_OR_DEPARTMENT_OTHER): Payer: Self-pay | Admitting: *Deleted

## 2014-05-12 NOTE — Progress Notes (Signed)
No labs needed-nurse at [facility]

## 2014-05-15 ENCOUNTER — Other Ambulatory Visit: Payer: Self-pay | Admitting: Orthopedic Surgery

## 2014-05-17 ENCOUNTER — Ambulatory Visit (HOSPITAL_BASED_OUTPATIENT_CLINIC_OR_DEPARTMENT_OTHER)
Admission: RE | Admit: 2014-05-17 | Discharge: 2014-05-17 | Disposition: A | Discharge status: Home / Self Care | Payer: 59 | Source: Ambulatory Visit | Attending: Orthopedic Surgery | Admitting: Orthopedic Surgery

## 2014-05-17 ENCOUNTER — Encounter (HOSPITAL_BASED_OUTPATIENT_CLINIC_OR_DEPARTMENT_OTHER)
Admission: RE | Disposition: A | Discharge status: Home / Self Care | Payer: Self-pay | Source: Ambulatory Visit | Attending: Orthopedic Surgery

## 2014-05-17 ENCOUNTER — Encounter (HOSPITAL_BASED_OUTPATIENT_CLINIC_OR_DEPARTMENT_OTHER): Payer: Self-pay | Admitting: Anesthesiology

## 2014-05-17 ENCOUNTER — Ambulatory Visit (HOSPITAL_BASED_OUTPATIENT_CLINIC_OR_DEPARTMENT_OTHER): Payer: 59 | Admitting: Anesthesiology

## 2014-05-17 DIAGNOSIS — M6752 Plica syndrome, left knee: Secondary | ICD-10-CM | POA: Insufficient documentation

## 2014-05-17 DIAGNOSIS — S83242A Other tear of medial meniscus, current injury, left knee, initial encounter: Secondary | ICD-10-CM | POA: Insufficient documentation

## 2014-05-17 DIAGNOSIS — E780 Pure hypercholesterolemia: Secondary | ICD-10-CM | POA: Insufficient documentation

## 2014-05-17 DIAGNOSIS — K219 Gastro-esophageal reflux disease without esophagitis: Secondary | ICD-10-CM | POA: Insufficient documentation

## 2014-05-17 DIAGNOSIS — F329 Major depressive disorder, single episode, unspecified: Secondary | ICD-10-CM | POA: Diagnosis not present

## 2014-05-17 DIAGNOSIS — X58XXXA Exposure to other specified factors, initial encounter: Secondary | ICD-10-CM | POA: Diagnosis not present

## 2014-05-17 DIAGNOSIS — M94262 Chondromalacia, left knee: Secondary | ICD-10-CM | POA: Diagnosis not present

## 2014-05-17 HISTORY — DX: Presence of spectacles and contact lenses: Z97.3

## 2014-05-17 HISTORY — PX: KNEE ARTHROSCOPY WITH MEDIAL MENISECTOMY: SHX5651

## 2014-05-17 LAB — POCT HEMOGLOBIN-HEMACUE: Hemoglobin: 14.5 g/dL (ref 12.0–15.0)

## 2014-05-17 SURGERY — ARTHROSCOPY, KNEE, WITH MEDIAL MENISCECTOMY
Anesthesia: General | Site: Knee | Laterality: Left

## 2014-05-17 MED ORDER — SCOPOLAMINE 1 MG/3DAYS TD PT72
MEDICATED_PATCH | TRANSDERMAL | Status: AC
  Filled 2014-05-17: qty 1

## 2014-05-17 MED ORDER — MIDAZOLAM HCL 2 MG/2ML IJ SOLN
INTRAMUSCULAR | Status: AC
  Filled 2014-05-17: qty 2

## 2014-05-17 MED ORDER — FENTANYL CITRATE 0.05 MG/ML IJ SOLN
INTRAMUSCULAR | Status: AC
  Filled 2014-05-17: qty 6

## 2014-05-17 MED ORDER — MIDAZOLAM HCL 5 MG/5ML IJ SOLN
INTRAMUSCULAR | Status: DC | PRN
  Administered 2014-05-17: 2 mg via INTRAVENOUS

## 2014-05-17 MED ORDER — SCOPOLAMINE 1 MG/3DAYS TD PT72
1.0000 | MEDICATED_PATCH | Freq: Once | TRANSDERMAL | Status: DC
  Administered 2014-05-17: 1.5 mg via TRANSDERMAL

## 2014-05-17 MED ORDER — LIDOCAINE HCL (CARDIAC) 20 MG/ML IV SOLN
INTRAVENOUS | Status: DC | PRN
  Administered 2014-05-17: 50 mg via INTRAVENOUS

## 2014-05-17 MED ORDER — MIDAZOLAM HCL 2 MG/2ML IJ SOLN: 1.0000 mg | INTRAMUSCULAR | Status: DC | PRN

## 2014-05-17 MED ORDER — CLINDAMYCIN PHOSPHATE 900 MG/50ML IV SOLN
INTRAVENOUS | Status: AC
  Filled 2014-05-17: qty 50

## 2014-05-17 MED ORDER — BUPIVACAINE HCL (PF) 0.5 % IJ SOLN
INTRAMUSCULAR | Status: AC
  Filled 2014-05-17: qty 30

## 2014-05-17 MED ORDER — HYDROMORPHONE HCL 1 MG/ML IJ SOLN
INTRAMUSCULAR | Status: AC
  Filled 2014-05-17: qty 1

## 2014-05-17 MED ORDER — LACTATED RINGERS IV SOLN
INTRAVENOUS | Status: DC
  Administered 2014-05-17 (×2): via INTRAVENOUS

## 2014-05-17 MED ORDER — OXYCODONE HCL 5 MG/5ML PO SOLN: 5.0000 mg | Freq: Once | ORAL | Status: DC | PRN

## 2014-05-17 MED ORDER — EPINEPHRINE HCL 1 MG/ML IJ SOLN
INTRAMUSCULAR | Status: DC | PRN
  Administered 2014-05-17: 3000 mL

## 2014-05-17 MED ORDER — CHLORHEXIDINE GLUCONATE 4 % EX LIQD: 60.0000 mL | Freq: Once | CUTANEOUS | Status: DC

## 2014-05-17 MED ORDER — BUPIVACAINE-EPINEPHRINE (PF) 0.5% -1:200000 IJ SOLN
INTRAMUSCULAR | Status: AC
  Filled 2014-05-17: qty 30

## 2014-05-17 MED ORDER — HYDROMORPHONE HCL 1 MG/ML IJ SOLN
0.2500 mg | INTRAMUSCULAR | Status: DC | PRN
  Administered 2014-05-17: 0.25 mg via INTRAVENOUS
  Administered 2014-05-17: 0.5 mg via INTRAVENOUS
  Administered 2014-05-17: 0.25 mg via INTRAVENOUS

## 2014-05-17 MED ORDER — OXYCODONE HCL 5 MG PO TABS: 5.0000 mg | ORAL_TABLET | Freq: Once | ORAL | Status: DC | PRN

## 2014-05-17 MED ORDER — KETOROLAC TROMETHAMINE 30 MG/ML IJ SOLN
INTRAMUSCULAR | Status: DC | PRN
  Administered 2014-05-17: 30 mg via INTRAVENOUS

## 2014-05-17 MED ORDER — DEXAMETHASONE SODIUM PHOSPHATE 10 MG/ML IJ SOLN
INTRAMUSCULAR | Status: DC | PRN
  Administered 2014-05-17: 10 mg via INTRAVENOUS

## 2014-05-17 MED ORDER — CLINDAMYCIN PHOSPHATE 900 MG/50ML IV SOLN: 900.0000 mg | INTRAVENOUS | Status: DC

## 2014-05-17 MED ORDER — EPINEPHRINE HCL 1 MG/ML IJ SOLN
INTRAMUSCULAR | Status: AC
  Filled 2014-05-17: qty 1

## 2014-05-17 MED ORDER — ONDANSETRON HCL 4 MG/2ML IJ SOLN
INTRAMUSCULAR | Status: DC | PRN
  Administered 2014-05-17: 4 mg via INTRAVENOUS

## 2014-05-17 MED ORDER — FENTANYL CITRATE 0.05 MG/ML IJ SOLN
INTRAMUSCULAR | Status: DC | PRN
  Administered 2014-05-17 (×2): 25 ug via INTRAVENOUS
  Administered 2014-05-17: 100 ug via INTRAVENOUS

## 2014-05-17 MED ORDER — BUPIVACAINE HCL (PF) 0.5 % IJ SOLN
INTRAMUSCULAR | Status: DC | PRN
  Administered 2014-05-17: 20 mL via INTRA_ARTICULAR

## 2014-05-17 MED ORDER — CLINDAMYCIN PHOSPHATE 900 MG/50ML IV SOLN
INTRAVENOUS | Status: DC | PRN
  Administered 2014-05-17: 900 mg via INTRAVENOUS

## 2014-05-17 MED ORDER — OXYCODONE-ACETAMINOPHEN 5-325 MG PO TABS: 1.0000 | ORAL_TABLET | ORAL | Status: DC | PRN

## 2014-05-17 MED ORDER — PROPOFOL 10 MG/ML IV BOLUS
INTRAVENOUS | Status: DC | PRN
  Administered 2014-05-17: 200 mg via INTRAVENOUS
  Administered 2014-05-17: 100 mg via INTRAVENOUS

## 2014-05-17 MED ORDER — FENTANYL CITRATE 0.05 MG/ML IJ SOLN: 50.0000 ug | INTRAMUSCULAR | Status: DC | PRN

## 2014-05-17 SURGICAL SUPPLY — 42 items
BANDAGE ELASTIC 6 VELCRO ST LF (GAUZE/BANDAGES/DRESSINGS) ×4 IMPLANT
BLADE 4.2CUDA (BLADE) IMPLANT
BLADE GREAT WHITE 4.2 (BLADE) ×3 IMPLANT
BLADE GREAT WHITE 4.2MM (BLADE) ×1
CANISTER SUCT 3000ML (MISCELLANEOUS) IMPLANT
CUTTER MENISCUS  4.2MM (BLADE)
CUTTER MENISCUS 4.2MM (BLADE) IMPLANT
DRAPE ARTHROSCOPY W/POUCH 114 (DRAPES) ×4 IMPLANT
DRSG EMULSION OIL 3X3 NADH (GAUZE/BANDAGES/DRESSINGS) ×4 IMPLANT
DURAPREP 26ML APPLICATOR (WOUND CARE) ×4 IMPLANT
ELECT MENISCUS 165MM 90D (ELECTRODE) IMPLANT
ELECT REM PT RETURN 9FT ADLT (ELECTROSURGICAL)
ELECTRODE REM PT RTRN 9FT ADLT (ELECTROSURGICAL) IMPLANT
GAUZE SPONGE 4X4 12PLY STRL (GAUZE/BANDAGES/DRESSINGS) ×4 IMPLANT
GLOVE BIOGEL PI IND STRL 7.0 (GLOVE) ×2 IMPLANT
GLOVE BIOGEL PI IND STRL 8 (GLOVE) ×4 IMPLANT
GLOVE BIOGEL PI INDICATOR 7.0 (GLOVE) ×2
GLOVE BIOGEL PI INDICATOR 8 (GLOVE) ×4
GLOVE ECLIPSE 6.5 STRL STRAW (GLOVE) ×4 IMPLANT
GLOVE ECLIPSE 7.5 STRL STRAW (GLOVE) ×8 IMPLANT
GLOVE EXAM NITRILE LRG STRL (GLOVE) ×4 IMPLANT
GOWN STRL REUS W/ TWL LRG LVL3 (GOWN DISPOSABLE) ×2 IMPLANT
GOWN STRL REUS W/ TWL XL LVL3 (GOWN DISPOSABLE) ×2 IMPLANT
GOWN STRL REUS W/TWL LRG LVL3 (GOWN DISPOSABLE) ×4
GOWN STRL REUS W/TWL XL LVL3 (GOWN DISPOSABLE) ×8 IMPLANT
HOLDER KNEE FOAM BLUE (MISCELLANEOUS) ×4 IMPLANT
KNEE WRAP E Z 3 GEL PACK (MISCELLANEOUS) ×4 IMPLANT
MANIFOLD NEPTUNE II (INSTRUMENTS) ×3 IMPLANT
NDL SAFETY ECLIPSE 18X1.5 (NEEDLE) ×1 IMPLANT
NEEDLE HYPO 18GX1.5 SHARP (NEEDLE) ×4
PACK ARTHROSCOPY DSU (CUSTOM PROCEDURE TRAY) ×4 IMPLANT
PACK BASIN DAY SURGERY FS (CUSTOM PROCEDURE TRAY) ×4 IMPLANT
PAD CAST 4YDX4 CTTN HI CHSV (CAST SUPPLIES) ×2 IMPLANT
PADDING CAST COTTON 4X4 STRL (CAST SUPPLIES) ×4
PENCIL BUTTON HOLSTER BLD 10FT (ELECTRODE) IMPLANT
SET ARTHROSCOPY TUBING (MISCELLANEOUS) ×4
SET ARTHROSCOPY TUBING LN (MISCELLANEOUS) ×2 IMPLANT
SUT ETHILON 4 0 PS 2 18 (SUTURE) IMPLANT
SYR 5ML LL (SYRINGE) ×4 IMPLANT
TOWEL OR 17X24 6PK STRL BLUE (TOWEL DISPOSABLE) ×4 IMPLANT
TOWEL OR NON WOVEN STRL DISP B (DISPOSABLE) ×1 IMPLANT
WATER STERILE IRR 1000ML POUR (IV SOLUTION) ×4 IMPLANT

## 2014-05-17 NOTE — Anesthesia Procedure Notes (Signed)
Procedure Name: LMA Insertion Date/Time: 05/17/2014 12:48 PM Performed by: Toula Moos L Pre-anesthesia Checklist: Patient identified, Emergency Drugs available, Suction available, Patient being monitored and Timeout performed Patient Re-evaluated:Patient Re-evaluated prior to inductionOxygen Delivery Method: Circle System Utilized Preoxygenation: Pre-oxygenation with 100% oxygen Intubation Type: IV induction Ventilation: Mask ventilation without difficulty LMA: LMA inserted LMA Size: 4.0 Number of attempts: 1 Airway Equipment and Method: bite block Placement Confirmation: positive ETCO2 Tube secured with: Tape Dental Injury: Teeth and Oropharynx as per pre-operative assessment

## 2014-05-17 NOTE — Transfer of Care (Signed)
Immediate Anesthesia Transfer of Care Note  Patient: Joanna Willis  Procedure(s) Performed: Procedure(s): LEFT ARTHROSCOPY KNEE WITH PARTIAL MEDIAL MENISECTOMY, CHONDROPLASTY OF LATERAL FEMORAL CONDYLE (Left)  Patient Location: PACU  Anesthesia Type:General  Level of Consciousness: alert  and patient cooperative  Airway & Oxygen Therapy: Patient Spontanous Breathing and Patient connected to face mask oxygen  Post-op Assessment: Report given to PACU RN and Post -op Vital signs reviewed and stable  Post vital signs: Reviewed and stable  Complications: No apparent anesthesia complications

## 2014-05-17 NOTE — Anesthesia Preprocedure Evaluation (Addendum)
Anesthesia Evaluation  Patient identified by MRN, date of birth, ID band Patient awake    Reviewed: Allergy & Precautions, H&P , NPO status , Patient's Chart, lab work & pertinent test results  Airway Mallampati: II  TM Distance: >3 FB Neck ROM: Full    Dental no notable dental hx. (+) Teeth Intact, Dental Advisory Given   Pulmonary neg pulmonary ROS,  breath sounds clear to auscultation  Pulmonary exam normal       Cardiovascular negative cardio ROS  Rhythm:Regular Rate:Normal     Neuro/Psych Depression negative neurological ROS  negative psych ROS   GI/Hepatic Neg liver ROS, GERD-  Medicated and Controlled,  Endo/Other  negative endocrine ROS  Renal/GU negative Renal ROS  negative genitourinary   Musculoskeletal   Abdominal   Peds  Hematology negative hematology ROS (+)   Anesthesia Other Findings   Reproductive/Obstetrics negative OB ROS                            Anesthesia Physical Anesthesia Plan  ASA: II  Anesthesia Plan: General   Post-op Pain Management:    Induction: Intravenous  Airway Management Planned: LMA  Additional Equipment:   Intra-op Plan:   Post-operative Plan: Extubation in OR  Informed Consent: I have reviewed the patients History and Physical, chart, labs and discussed the procedure including the risks, benefits and alternatives for the proposed anesthesia with the patient or authorized representative who has indicated his/her understanding and acceptance.   Dental advisory given  Plan Discussed with: CRNA  Anesthesia Plan Comments:         Anesthesia Quick Evaluation

## 2014-05-17 NOTE — H&P (Signed)
PREOPERATIVE H&P  Chief Complaint: l knee pain  HPI: Joanna Willis is a 58 y.o. female who presents for evaluation of l knee pain. It has been present for greater than 3 months and has been worsening. She has failed conservative measures. Pain is rated as moderate.  Past Medical History  Diagnosis Date  . GERD (gastroesophageal reflux disease)   . Hypercholesteremia   . Depression   . Postmenopausal   . Wears glasses    Past Surgical History  Procedure Laterality Date  . Cesarean section  2000  . Abdominal hysterectomy  2005    did not remove ovaries  . Colonoscopy    . Foot arthrodesis  1995    right   History   Social History  . Marital Status: Married    Spouse Name: N/A    Number of Children: N/A  . Years of Education: N/A   Social History Main Topics  . Smoking status: Never Smoker   . Smokeless tobacco: Never Used  . Alcohol Use: 4.2 oz/week    7 Cans of beer per week  . Drug Use: No  . Sexual Activity: None   Other Topics Concern  . None   Social History Narrative   Family History  Problem Relation Age of Onset  . Pancreatic cancer Neg Hx   . Stomach cancer Neg Hx   . Colon cancer Mother 78    lived 6 months after diagnosis   Allergies  Allergen Reactions  . Penicillins     REACTION: rash   Prior to Admission medications   Medication Sig Start Date End Date Taking? Authorizing Provider  citalopram (CELEXA) 20 MG tablet Take 20 mg by mouth daily.   Yes Historical Provider, MD  estradiol (VIVELLE-DOT) 0.05 MG/24HR patch Place 1 patch onto the skin 2 (two) times a week.   Yes Historical Provider, MD  methocarbamol (ROBAXIN) 500 MG tablet Take 2 tablets (1,000 mg total) by mouth 4 (four) times daily as needed (Pain). 04/14/14  Yes Nicole Pisciotta, PA-C  omeprazole (PRILOSEC) 20 MG capsule Take 20 mg by mouth daily.   Yes Historical Provider, MD  simvastatin (ZOCOR) 10 MG tablet Take 10 mg by mouth daily at 6 PM.   Yes Historical Provider, MD   HYDROcodone-acetaminophen (NORCO/VICODIN) 5-325 MG per tablet Take 1-2 tablets by mouth every 6 hours as needed for pain. 04/14/14   Nicole Pisciotta, PA-C     Positive ROS: none  All other systems have been reviewed and were otherwise negative with the exception of those mentioned in the HPI and as above.  Physical Exam: Filed Vitals:   05/17/14 1055  BP: 110/72  Pulse: 54  Temp: 97.8 F (36.6 C)  Resp: 20    General: Alert, no acute distress Cardiovascular: No pedal edema Respiratory: No cyanosis, no use of accessory musculature GI: No organomegaly, abdomen is soft and non-tender Skin: No lesions in the area of chief complaint Neurologic: Sensation intact distally Psychiatric: Patient is competent for consent with normal mood and affect Lymphatic: No axillary or cervical lymphadenopathy  MUSCULOSKELETAL: l knee: +mmed jt line tender// +mcmurray -instability  Assessment/Plan: left knee lateral meniscus tear Plan for Procedure(s): LEFT ARTHROSCOPY KNEE  The risks benefits and alternatives were discussed with the patient including but not limited to the risks of nonoperative treatment, versus surgical intervention including infection, bleeding, nerve injury, malunion, nonunion, hardware prominence, hardware failure, need for hardware removal, blood clots, cardiopulmonary complications, morbidity, mortality, among others, and they were willing  to proceed.  Predicted outcome is good, although there will be at least a six to nine month expected recovery.  Alta Corning, MD 05/17/2014 12:45 PM

## 2014-05-17 NOTE — Discharge Instructions (Signed)

## 2014-05-17 NOTE — Anesthesia Postprocedure Evaluation (Signed)
  Anesthesia Post-op Note  Patient: Joanna Willis  Procedure(s) Performed: Procedure(s): LEFT ARTHROSCOPY KNEE WITH PARTIAL MEDIAL MENISECTOMY, CHONDROPLASTY OF LATERAL FEMORAL CONDYLE (Left)  Patient Location: PACU  Anesthesia Type:General  Level of Consciousness: awake and alert   Airway and Oxygen Therapy: Patient Spontanous Breathing  Post-op Pain: moderate  Post-op Assessment: Post-op Vital signs reviewed, Patient's Cardiovascular Status Stable and Respiratory Function Stable  Post-op Vital Signs: Reviewed  Filed Vitals:   05/17/14 1400  BP: 145/85  Pulse: 77  Temp:   Resp: 14    Complications: No apparent anesthesia complications

## 2014-05-17 NOTE — Brief Op Note (Signed)
05/17/2014  2:18 PM  PATIENT:  Joanna Willis  58 y.o. female  PRE-OPERATIVE DIAGNOSIS:  left knee medial meniscus tear  POST-OPERATIVE DIAGNOSIS:  left knee medial meniscus tear, chondromalacia  PROCEDURE:  Procedure(s): LEFT ARTHROSCOPY KNEE WITH PARTIAL MEDIAL MENISECTOMY, CHONDROPLASTY OF LATERAL FEMORAL CONDYLE (Left)  SURGEON:  Surgeon(s) and Role:    * Alta Corning, MD - Primary  PHYSICIAN ASSISTANT:   ASSISTANTS: bethune   ANESTHESIA:   general  EBL:  Total I/O In: 1100 [I.V.:1100] Out: -   BLOOD ADMINISTERED:none  DRAINS: none   LOCAL MEDICATIONS USED:  MARCAINE     SPECIMEN:  No Specimen  DISPOSITION OF SPECIMEN:  N/A  COUNTS:  YES  TOURNIQUET:  * No tourniquets in log *  DICTATION: .Other Dictation: Dictation Number missed numbermissed number  PLAN OF CARE: Discharge to home after PACU  PATIENT DISPOSITION:  PACU - hemodynamically stable.   Delay start of Pharmacological VTE agent (>24hrs) due to surgical blood loss or risk of bleeding: no

## 2014-05-19 NOTE — Op Note (Signed)
NAMEMarland Kitchen  JOSSELINE, REDDIN NO.:  [id number]  MEDICAL RECORD NO.:  [id number]  LOCATION:                                 FACILITY:  PHYSICIAN:  Alta Corning, M.D.   DATE OF BIRTH:  06-Nov-1955  DATE OF PROCEDURE:  05/17/2014 DATE OF DISCHARGE:  05/17/2014                              OPERATIVE REPORT   PREOPERATIVE DIAGNOSES:  Medial meniscal tear.  POSTOPERATIVE DIAGNOSES: 1. Medial meniscal tear. 2. Chondromalacia lateral femoral condyle. 3. Large medial shelf plica.  PROCEDURE: 1. Partial medial meniscectomy. 2. Chondroplasty lateral femoral condyle and patellofemoral joint. 3. Partial medial plica excision.  SURGEON:  Alta Corning, MD  ASSISTANT:  Modena Slater.  ANESTHESIA:  General.  BRIEF HISTORY:  Ms. Joanna Willis is a 58 year old female with history of significant complaint of left knee pain.  She had been treated conservatively for prolonged period of time.  After failure of all conservative care, she was taken to the operating room for operative knee arthroscopy.  PROCEDURE IN DETAIL:  The patient was taken to the operating room. After adequate anesthesia was obtained with general anesthetic, the patient was placed supine on the operating room table.  Left leg was prepped and draped in usual sterile fashion.  Following this, routine arthroscopic examination of the knee revealed there was an obvious mid body medial meniscal tear, this was debrided back to a smooth and stable rim.  Medial femoral condyle had some grade 2 change, which was debrided.  Attention was turned to the ACL, normal.  Attention was turned to the lateral side, it had some grade 2 change on lateral femoral condyle, and this was debrided.  Lateral meniscus was normal. Attention was turned to the medial side, there was a large __________ medial shelf plica which was debrided back to the rim to allow access around the medial femoral condyle.  There was some irritation in  the medial femoral condyle, where this was rubbing back and forth.  Patellofemoral joint had some grade 2 change that was debrided.  Sterile compressive dressing was applied.  The patient was taken to the recovery room and was noted to be in satisfactory condition.  Estimated blood loss for the procedure was minimal.     Alta Corning, M.D.     Corliss Skains  D:  05/17/2014  T:  05/17/2014  Job:  [id number]

## 2014-05-22 ENCOUNTER — Encounter (HOSPITAL_BASED_OUTPATIENT_CLINIC_OR_DEPARTMENT_OTHER): Payer: Self-pay | Admitting: Orthopedic Surgery

## 2014-05-22 ENCOUNTER — Ambulatory Visit: Payer: 59 | Admitting: Family Medicine

## 2014-07-01 ENCOUNTER — Emergency Department
Admission: EM | Admit: 2014-07-01 | Discharge: 2014-07-01 | Disposition: A | Discharge status: Home / Self Care | Payer: 59 | Source: Home / Self Care | Attending: Emergency Medicine | Admitting: Emergency Medicine

## 2014-07-01 ENCOUNTER — Encounter: Payer: Self-pay | Admitting: Emergency Medicine

## 2014-07-01 DIAGNOSIS — J0120 Acute ethmoidal sinusitis, unspecified: Secondary | ICD-10-CM

## 2014-07-01 MED ORDER — CLARITHROMYCIN 500 MG PO TABS
500.0000 mg | ORAL_TABLET | Freq: Two times a day (BID) | ORAL | Status: DC
Start: 2014-07-01 — End: 2014-07-01

## 2014-07-01 MED ORDER — CLARITHROMYCIN 500 MG PO TABS: 500.0000 mg | ORAL_TABLET | Freq: Two times a day (BID) | ORAL | Status: DC

## 2014-07-01 NOTE — ED Provider Notes (Signed)
CSN: [id number]     Arrival date & time 07/01/14  1459 History   First MD Initiated Contact with Patient 07/01/14 1513     Chief Complaint  Patient presents with  . Nasal Congestion  . Cough  . Sore Throat  . Fatigue   (Consider location/radiation/quality/duration/timing/severity/associated sxs/prior Treatment) Patient is a 59 y.o. female presenting with cough and pharyngitis. The history is provided by the patient. No language interpreter was used.  Cough Cough characteristics:  Productive Sputum characteristics:  Nondescript Severity:  Moderate Onset quality:  Gradual Duration:  9 days Timing:  Constant Progression:  Worsening Chronicity:  Recurrent Smoker: no   Relieved by:  Nothing Ineffective treatments:  None tried Associated symptoms: rhinorrhea   Associated symptoms: no chest pain and no fever   Sore Throat Pertinent negatives include no chest pain.    Past Medical History  Diagnosis Date  . GERD (gastroesophageal reflux disease)   . Hypercholesteremia   . Depression   . Postmenopausal   . Wears glasses    Past Surgical History  Procedure Laterality Date  . Cesarean section  2000  . Abdominal hysterectomy  2005    did not remove ovaries  . Colonoscopy    . Foot arthrodesis  1995    right  . Knee arthroscopy with medial menisectomy Left 05/17/2014    Procedure: LEFT ARTHROSCOPY KNEE WITH PARTIAL MEDIAL MENISECTOMY, CHONDROPLASTY OF LATERAL FEMORAL CONDYLE;  Surgeon: Alta Corning, MD;  Location: [facility];  Service: Orthopedics;  Laterality: Left;   Family History  Problem Relation Age of Onset  . Pancreatic cancer Neg Hx   . Stomach cancer Neg Hx   . Colon cancer Mother 53    lived 6 months after diagnosis   History  Substance Use Topics  . Smoking status: Never Smoker   . Smokeless tobacco: Never Used  . Alcohol Use: 4.2 oz/week    7 Cans of beer per week   OB History    No data available     Review of Systems   Constitutional: Negative for fever.  HENT: Positive for rhinorrhea.   Respiratory: Positive for cough.   Cardiovascular: Negative for chest pain.  All other systems reviewed and are negative.   Allergies  Penicillins  Home Medications   Prior to Admission medications   Medication Sig Start Date End Date Taking? Authorizing Provider  meloxicam (MOBIC) 15 MG tablet Take 15 mg by mouth daily.   Yes Historical Provider, MD  citalopram (CELEXA) 20 MG tablet Take 20 mg by mouth daily.    Historical Provider, MD  clarithromycin (BIAXIN) 500 MG tablet Take 1 tablet (500 mg total) by mouth 2 (two) times daily. 07/01/14   Fransico Meadow, PA-C  estradiol (VIVELLE-DOT) 0.05 MG/24HR patch Place 1 patch onto the skin 2 (two) times a week.    Historical Provider, MD  HYDROcodone-acetaminophen (NORCO/VICODIN) 5-325 MG per tablet Take 1-2 tablets by mouth every 6 hours as needed for pain. 04/14/14   Nicole Pisciotta, PA-C  methocarbamol (ROBAXIN) 500 MG tablet Take 2 tablets (1,000 mg total) by mouth 4 (four) times daily as needed (Pain). 04/14/14   Nicole Pisciotta, PA-C  omeprazole (PRILOSEC) 20 MG capsule Take 20 mg by mouth daily.    Historical Provider, MD  oxyCODONE-acetaminophen (ROXICET) 5-325 MG per tablet Take 1-2 tablets by mouth every 4 (four) hours as needed for severe pain. 05/17/14   Alta Corning, MD  simvastatin (ZOCOR) 10 MG tablet Take 10 mg  by mouth daily at 6 PM.    Historical Provider, MD   BP 123/74 mmHg  Pulse 68  Temp(Src) 97.7 F (36.5 C) (Oral)  Resp 16  Ht 5\' 6"  (1.676 m)  Wt 150 lb (68.04 kg)  BMI 24.22 kg/m2  SpO2 98% Physical Exam  Constitutional: She is oriented to person, place, and time. She appears well-developed and well-nourished.  HENT:  Head: Normocephalic.  Right Ear: External ear normal.  Left Ear: External ear normal.  Nose: Nose normal.  Mouth/Throat: Oropharynx is clear and moist.  Tender maxillary and frontal sinuses  Eyes: EOM are normal.  Neck:  Normal range of motion.  Cardiovascular: Normal rate.   Pulmonary/Chest: Effort normal.  Abdominal: Soft. She exhibits no distension.  Musculoskeletal: Normal range of motion.  Neurological: She is alert and oriented to person, place, and time.  Skin: Skin is warm.  Psychiatric: She has a normal mood and affect.  Nursing note and vitals reviewed.   ED Course  Procedures (including critical care time) Labs Review Labs Reviewed - No data to display  Imaging Review No results found.   MDM   1. Acute ethmoidal sinusitis, recurrence not specified   Biaxin mucinex AVS See your Md for recheck in 1 week if symptoms persist    Fransico Meadow, PA-C 07/01/14 1526

## 2014-07-01 NOTE — ED Notes (Signed)
Reports 9 days of congestion, cough; 3 days ago it worsened and is accompanied by fatigue and sore throat from PND. No recent OTCs.

## 2014-07-01 NOTE — Discharge Instructions (Signed)

## 2014-11-28 ENCOUNTER — Encounter: Payer: Self-pay | Admitting: Internal Medicine

## 2015-06-27 DIAGNOSIS — H524 Presbyopia: Secondary | ICD-10-CM | POA: Diagnosis not present

## 2015-07-02 MED FILL — OMEPRAZOLE DR 20 MG CAPSULE: 20 | 90 days supply | Qty: 90 | Fill #1

## 2015-07-02 MED FILL — ESTRADIOL 0.05 MG PATCH: 0.05 | 28 days supply | Qty: 8 | Fill #3

## 2015-07-09 MED FILL — MELOXICAM 15 MG TABLET: 15 | 90 days supply | Qty: 90 | Fill #1

## 2015-07-31 MED FILL — MOMETASONE FUROATE 50 MCG S: 50 | 30 days supply | Qty: 17 | Fill #1

## 2015-08-22 DIAGNOSIS — F419 Anxiety disorder, unspecified: Secondary | ICD-10-CM | POA: Diagnosis not present

## 2015-08-22 DIAGNOSIS — E785 Hyperlipidemia, unspecified: Secondary | ICD-10-CM | POA: Diagnosis not present

## 2015-08-22 DIAGNOSIS — F330 Major depressive disorder, recurrent, mild: Secondary | ICD-10-CM | POA: Diagnosis not present

## 2015-08-22 DIAGNOSIS — K219 Gastro-esophageal reflux disease without esophagitis: Secondary | ICD-10-CM | POA: Diagnosis not present

## 2015-08-22 DIAGNOSIS — E782 Mixed hyperlipidemia: Secondary | ICD-10-CM | POA: Insufficient documentation

## 2015-08-22 MED FILL — FLUTICASONE PROP 50 MCG SPR: 50 | 30 days supply | Qty: 16 | Fill #0

## 2015-08-22 MED FILL — CITALOPRAM HBR 20 MG TABLET: 20 | 90 days supply | Qty: 90 | Fill #0

## 2015-09-05 DIAGNOSIS — M1712 Unilateral primary osteoarthritis, left knee: Secondary | ICD-10-CM | POA: Diagnosis not present

## 2015-09-10 ENCOUNTER — Other Ambulatory Visit (HOSPITAL_BASED_OUTPATIENT_CLINIC_OR_DEPARTMENT_OTHER): Payer: Self-pay | Admitting: Physician Assistant

## 2015-09-10 ENCOUNTER — Other Ambulatory Visit (HOSPITAL_BASED_OUTPATIENT_CLINIC_OR_DEPARTMENT_OTHER): Payer: Self-pay | Admitting: Orthopedic Surgery

## 2015-09-10 DIAGNOSIS — M1712 Unilateral primary osteoarthritis, left knee: Secondary | ICD-10-CM

## 2015-09-11 MED FILL — SIMVASTATIN 10 MG TABLET: 10 | 90 days supply | Qty: 90 | Fill #0 | Status: TO

## 2015-09-15 ENCOUNTER — Ambulatory Visit (HOSPITAL_BASED_OUTPATIENT_CLINIC_OR_DEPARTMENT_OTHER)
Admission: RE | Admit: 2015-09-15 | Discharge: 2015-09-15 | Disposition: A | Discharge status: Home / Self Care | Payer: 59 | Source: Ambulatory Visit | Attending: Orthopedic Surgery | Admitting: Orthopedic Surgery

## 2015-09-15 DIAGNOSIS — M25562 Pain in left knee: Secondary | ICD-10-CM | POA: Diagnosis not present

## 2015-09-15 DIAGNOSIS — M1712 Unilateral primary osteoarthritis, left knee: Secondary | ICD-10-CM | POA: Insufficient documentation

## 2015-09-17 MED FILL — ESTRADIOL 0.05 MG PATCH: 0.05 | 28 days supply | Qty: 8 | Fill #4 | Status: TO

## 2015-09-25 DIAGNOSIS — M1712 Unilateral primary osteoarthritis, left knee: Secondary | ICD-10-CM | POA: Diagnosis not present

## 2015-09-27 MED FILL — OMEPRAZOLE DR 20 MG CAPSULE: 20 | 90 days supply | Qty: 90 | Fill #0

## 2015-10-08 ENCOUNTER — Ambulatory Visit: Payer: 59 | Attending: Orthopedic Surgery | Admitting: Physical Therapy

## 2015-10-08 DIAGNOSIS — R262 Difficulty in walking, not elsewhere classified: Secondary | ICD-10-CM | POA: Diagnosis not present

## 2015-10-08 DIAGNOSIS — M25562 Pain in left knee: Secondary | ICD-10-CM

## 2015-10-08 DIAGNOSIS — M25662 Stiffness of left knee, not elsewhere classified: Secondary | ICD-10-CM | POA: Diagnosis not present

## 2015-10-09 NOTE — Therapy (Signed)
[facility] [address]  [place], [place], [address] Phone: [phone]   Fax:  [phone]  Physical Therapy Evaluation  Patient Details  Name: Joanna Willis MRN: [id number] Date of Birth: 1956-05-10 Referring Provider: [name], MD  Encounter Date: 10/08/2015      PT End of Session - 10/08/15 1619    Visit Number 1   Number of Visits 8   Date for PT Re-Evaluation 11/05/15   PT Start Time [id number]   PT Stop Time 1619   PT Time Calculation (min) 49 min   Activity Tolerance Patient tolerated treatment well   Behavior During Therapy [facility] for tasks assessed/performed      Past Medical History  Diagnosis Date  . GERD (gastroesophageal reflux disease)   . Hypercholesteremia   . Depression   . Postmenopausal   . Wears glasses     Past Surgical History  Procedure Laterality Date  . Cesarean section  2000  . Abdominal hysterectomy  2005    did not remove ovaries  . Colonoscopy    . Foot arthrodesis  1995    right  . Knee arthroscopy with medial menisectomy Left 05/17/2014    Procedure: LEFT ARTHROSCOPY KNEE WITH PARTIAL MEDIAL MENISECTOMY, CHONDROPLASTY OF LATERAL FEMORAL CONDYLE;  Surgeon: Alta Corning, MD;  Location: [place];  Service: Orthopedics;  Laterality: Left;    There were no vitals filed for this visit.       Subjective Assessment - 10/08/15 1531    Subjective Pt reports she initially injured her mensicus in her L knee ~1.5 yrs ago and underwent surgical debridement at the time but did not have any PT. Pain has progressively gotten worse and recent MRI showed porgression of osteoarthritis. Received cortisone injection but notes only minimal relief, primarliy noticeable with decrased feeling of need to "hobble" when taking first few steps upon rising.   Pertinent History 05/17/14 L knee arthroscopy: Partial medial meniscectomy, Chondroplasty lateral femoral condyle and  patellofemoral joint, Partial medial plica excision.   Limitations Standing;Walking   How long can you stand comfortably? 4 hrs   How long can you walk comfortably? limited on uneven or hilly terrain   Diagnostic tests 09/15/15 MRI L knee:  Appearance of the medial meniscus most consistent with postoperative change. No meniscal tear identified.  Progressive osteoarthritis about the medial compartment of the knee.   Patient Stated Goals "Be more flexible and walk with even stride."   Currently in Pain? No/denies   Pain Score 0-No pain  Least 0/10, Avg 4-5/10, Worst 10/10   Pain Location Knee   Pain Orientation Left;Anterior;Medial   Pain Descriptors / Indicators Sharp   Pain Type Chronic pain   Pain Radiating Towards n/a   Pain Onset More than a month ago   Pain Frequency Intermittent   Aggravating Factors  Standing, bending, stooping, walking on concrete   Pain Relieving Factors Meds, rest, ice   Effect of Pain on Daily Activities Makes cleaning house difficult            [facility] PT Assessment - 10/08/15 1530    Assessment   Medical Diagnosis L knee OA   Referring Provider [name], MD   Onset Date/Surgical Date --  1.5 yrs ago   Next MD Visit early May   Prior Therapy none   Balance Screen   Has the patient fallen in the past 6 months No   Has the patient had  a decrease in activity level because of a fear of falling?  No   Is the patient reluctant to leave their home because of a fear of falling?  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   Leisure garden, cook, ride bikes, care  for animals   Observation/Other Assessments   Focus on Therapeutic Outcomes (FOTO)  Knee 36% (64% limitation); Predicted 54% (46% limitation)   ROM / Strength   AROM / PROM / Strength AROM;Strength   AROM   AROM Assessment Site Knee   Right/Left Knee Right;Left   Right Knee Extension -1   Right Knee Flexion 139   Left Knee Extension -1   Left Knee Flexion 140   Strength   Strength Assessment Site Hip;Knee   Right/Left Hip Right;Left   Right Hip Flexion 5/5   Right Hip Extension 4/5   Right Hip ABduction 4+/5   Right Hip ADduction 4+/5   Left Hip Flexion 4/5   Left Hip Extension 3+/5   Left Hip ABduction 4/5   Left Hip ADduction 4/5   Right/Left Knee Right;Left   Right Knee Flexion 5/5   Right Knee Extension 5/5   Left Knee Flexion 4-/5   Left Knee Extension 4-/5   Flexibility   Soft Tissue Assessment /Muscle Length yes   Hamstrings mildly tight on L   Quadriceps moderately tight on L, esp RF   ITB mild/mod tight on L   Piriformis mildly tight bilaterally   Palpation   Patella mobility WFL but decreased VMO activation on L   Palpation comment mild edema noted superior pole of patella but no ttp         Today's Treatment HEP Instruction:    Supine L Hamstring stretch with strap 2x30" (standing HS stretch with foot on stool also demonstrated but pt did not attempt)    Supine L ITB stretch with strap x30"    Standing L ITB stretch at wall x30"    Prone L Quad stretch with strap x30" (instructions provided for isolation of RF by adding hip extension with knee elevated on pillow)    Mod thomas L Hip flexor stretch x30"    1/2 kneel lunge L Hip flexor/RF stretch x30"          PT Education - 10/08/15 1615    Education provided Yes   Education Details PT eval findings, POC & initial stretching HEP (pt provided mutiple options for stretching some muscles and instructed to choose most effective stretch or each)   Person(s) Educated Patient   Methods  Explanation;Demonstration;Handout   Comprehension Verbalized understanding;Returned demonstration;Need further instruction             PT Long Term Goals - 10/08/15 1620    PT LONG TERM GOAL #1   Title Pt will be independent with HEP by 11/05/15   Status New   PT LONG TERM GOAL #2   Title Pt will ambuate with even stride length and normal gait pattern by 11/05/15   Status New   PT LONG TERM GOAL #3   Title  Pt will demonstrate L hip and knee strength at least 4+/5 or greater by 11/05/15   Status New   PT LONG TERM GOAL #4   Title Pt will be able to complete job tasks and household chores with L knee pain no greater than 4/10 by 11/05/15   Status New               Plan - 10/08/15 1619    Clinical Impression Statement Joanna Willis is a 60 y/o female who presents to OP PT with L knee pain from osteoarthritis. Pt previously suffered a meniscal injury in Nov 2015 and underwent arthroscopic debridement on 05/17/16 but did not have any therapy at that time. Pt now reports worsening L knee pain aggravated by static standing, bending, stooping and walking on concrete. Assessment reveals full L knee ROM equivalent to R knee, but increased muscle tightness in hamstrings, quads, hip flexors and ITB. Pt describes pain as deep within the joint (4-5/10 on average but up to [id number] briefly with certain movements) and no ttp but mild superior patella/distal quad edema noted. MMT reveals L hip and knee weakness relative to R with hip grossly 4/5 except extension 3+/5 and knee 4-/5.   Rehab Potential Good   PT Frequency 2x / week   PT Duration 4 weeks   PT Treatment/Interventions Patient/family education;Therapeutic exercise;Manual techniques;Taping;Therapeutic activities;Neuromuscular re-education;Electrical Stimulation;Cryotherapy;Vasopneumatic Device;Iontophoresis 4mg /ml Dexamethasone;Gait training;ADLs/Self Care Home Management   PT Next Visit Plan Review stretching HEP and create basic strengthening HEP; L  hip and knee flexibility and strengthening; Modalities PRN for pain management   Consulted and Agree with Plan of Care Patient      Patient will benefit from skilled therapeutic intervention in order to improve the following deficits and impairments:  Pain, Impaired flexibility, Decreased strength, Difficulty walking, Abnormal gait, Decreased activity tolerance, Increased edema  Visit Diagnosis: Pain in left knee  Difficulty in walking, not elsewhere classified  Stiffness of left knee, not elsewhere classified     Problem List Patient Active Problem List   Diagnosis Date Noted  . Left knee pain 04/19/2014  . BRONCHITIS, ACUTE 07/21/2010    Percival Spanish, PT, MPT 10/09/2015, 8:42 AM  [facility] [address]  [place], [place], [address] Phone: [phone]   Fax:  [phone]  Name: Joanna Willis MRN: [id number] Date of Birth: [birth year]

## 2015-10-10 ENCOUNTER — Ambulatory Visit: Payer: 59

## 2015-10-10 DIAGNOSIS — M25662 Stiffness of left knee, not elsewhere classified: Secondary | ICD-10-CM

## 2015-10-10 DIAGNOSIS — R262 Difficulty in walking, not elsewhere classified: Secondary | ICD-10-CM | POA: Diagnosis not present

## 2015-10-10 DIAGNOSIS — M25562 Pain in left knee: Secondary | ICD-10-CM | POA: Diagnosis not present

## 2015-10-10 NOTE — Therapy (Signed)
[facility] [address]  [place], [place], [address] Phone: [phone]   Fax:  [phone]  Physical Therapy Treatment  Patient Details  Name: Joanna Willis MRN: [id number] Date of Birth: [birth year] Referring Provider: [name], MD  Encounter Date: 10/10/2015      PT End of Session - 10/10/15 0852    Visit Number 2   Number of Visits 8   Date for PT Re-Evaluation 11/05/15   PT Start Time 0806   PT Stop Time 0846   PT Time Calculation (min) 40 min   Activity Tolerance Patient tolerated treatment well   Behavior During Therapy [facility] for tasks assessed/performed      Past Medical History  Diagnosis Date  . GERD (gastroesophageal reflux disease)   . Hypercholesteremia   . Depression   . Postmenopausal   . Wears glasses     Past Surgical History  Procedure Laterality Date  . Cesarean section  2000  . Abdominal hysterectomy  2005    did not remove ovaries  . Colonoscopy    . Foot arthrodesis  1995    right  . Knee arthroscopy with medial menisectomy Left 05/17/2014    Procedure: LEFT ARTHROSCOPY KNEE WITH PARTIAL MEDIAL MENISECTOMY, CHONDROPLASTY OF LATERAL FEMORAL CONDYLE;  Surgeon: Alta Corning, MD;  Location: [place];  Service: Orthopedics;  Laterality: Left;    There were no vitals filed for this visit.      Subjective Assessment - 10/10/15 0959    Subjective Pt. reports no L pain currently however L knee bothers her in bed sometimes at night.     Patient Stated Goals "Be more flexible and walk with even stride."   Currently in Pain? No/denies   Pain Score 0-No pain   Multiple Pain Sites No     Today's Treatment:  Therex: Supine L HS stretch with strap x 30 sec  Supine L ITB stretch with strap x 30 sec L RF stretch in mod thomas x 30 sec  L RF stretch in prone with strap x 30 sec  Bridging x 10 reps  Bridging with hip isometric / ER with blue TB 3 x 5 reps  B  sidelying clam shell with blue TB x 15 reps  Straight leg bridge with heels on peanut p-ball x 10 reps  B HS curl 5" with quad set 5" with heels on peanut p-ball x 10 reps  Bridge with adduction bal squeeze x 10 reps Side stepping with blue TB 2 x 20 ft   HEP stretching review         PT Education - 10/10/15 0855    Education provided Yes   Education Details basic hip strengthening HEP   Person(s) Educated Patient   Methods Handout;Tactile cues;Verbal cues;Explanation   Comprehension Verbalized understanding;Need further instruction;Verbal cues required;Returned demonstration             PT Long Term Goals - 10/10/15 1003    PT LONG TERM GOAL #1   Title Pt will be independent with HEP by 11/05/15   Status On-going   PT LONG TERM GOAL #2   Title Pt will ambuate with even stride length and normal gait pattern by 11/05/15   Status On-going   PT LONG TERM GOAL #3   Title Pt will demonstrate L hip and knee strength at least 4+/5 or greater by 11/05/15   Status On-going   PT LONG TERM GOAL #4  Title Pt will be able to complete job tasks and household chores with L knee pain no greater than 4/10 by 11/05/15   Status On-going               Plan - 10/10/15 0853    Clinical Impression Statement Pt. performed well today with all hip / knee strengthening therex with all therex confined to mat table today; pt. given a basic hip strengthening HEP today and performed well with stretching HEP review recalling all activities.     PT Treatment/Interventions Patient/family education;Therapeutic exercise;Manual techniques;Taping;Therapeutic activities;Neuromuscular re-education;Electrical Stimulation;Cryotherapy;Vasopneumatic Device;Iontophoresis 4mg /ml Dexamethasone;Gait training;ADLs/Self Care Home Management   PT Next Visit Plan Review basic hip strengthening HEP ; L hip and knee flexibility and strengthening; Modalities PRN for pain management      Patient will benefit from  skilled therapeutic intervention in order to improve the following deficits and impairments:  Pain, Impaired flexibility, Decreased strength, Difficulty walking, Abnormal gait, Decreased activity tolerance, Increased edema  Visit Diagnosis: Pain in left knee  Difficulty in walking, not elsewhere classified  Stiffness of left knee, not elsewhere classified     Problem List Patient Active Problem List   Diagnosis Date Noted  . Left knee pain 04/19/2014  . BRONCHITIS, ACUTE 07/21/2010    Bess Harvest, PTA 10/10/2015, 10:07 AM  [facility] [address]  [place], [place], [address] Phone: [phone]   Fax:  [phone]  Name: Joanna Willis MRN: [id number] Date of Birth: Jul 10, 1955

## 2015-10-15 ENCOUNTER — Ambulatory Visit: Payer: 59

## 2015-10-15 DIAGNOSIS — R262 Difficulty in walking, not elsewhere classified: Secondary | ICD-10-CM | POA: Diagnosis not present

## 2015-10-15 DIAGNOSIS — M25662 Stiffness of left knee, not elsewhere classified: Secondary | ICD-10-CM | POA: Diagnosis not present

## 2015-10-15 DIAGNOSIS — M25562 Pain in left knee: Secondary | ICD-10-CM

## 2015-10-15 NOTE — Therapy (Signed)
[facility] [address]  [place], [place], [address] Phone: [phone]   Fax:  [phone]  Physical Therapy Treatment  Patient Details  Name: Joanna Willis MRN: [id number] Date of Birth: [birth year] Referring Provider: Gorden Harms, MD  Encounter Date: 10/15/2015      PT End of Session - 10/15/15 1541    Visit Number 3   Number of Visits 8   Date for PT Re-Evaluation 11/05/15   PT Start Time [id number]   PT Stop Time 1615   PT Time Calculation (min) 40 min   Activity Tolerance Patient tolerated treatment well   Behavior During Therapy [facility] for tasks assessed/performed      Past Medical History  Diagnosis Date  . GERD (gastroesophageal reflux disease)   . Hypercholesteremia   . Depression   . Postmenopausal   . Wears glasses     Past Surgical History  Procedure Laterality Date  . Cesarean section  2000  . Abdominal hysterectomy  2005    did not remove ovaries  . Colonoscopy    . Foot arthrodesis  1995    right  . Knee arthroscopy with medial menisectomy Left 05/17/2014    Procedure: LEFT ARTHROSCOPY KNEE WITH PARTIAL MEDIAL MENISECTOMY, CHONDROPLASTY OF LATERAL FEMORAL CONDYLE;  Surgeon: Alta Corning, MD;  Location: [place];  Service: Orthopedics;  Laterality: Left;    There were no vitals filed for this visit.      Subjective Assessment - 10/15/15 1539    Subjective Pt. reports adherence to strengthening HEP and 3/10 L knee pain initially today.     Patient Stated Goals "Be more flexible and walk with even stride."   Currently in Pain? Yes   Pain Score 3    Pain Location Knee   Pain Orientation Left;Anterior;Medial   Pain Descriptors / Indicators Sharp   Pain Type Chronic pain   Pain Radiating Towards n/a   Pain Onset More than a month ago   Pain Frequency Intermittent   Multiple Pain Sites No            [facility] PT Assessment - 10/15/15 1547    Assessment   Medical  Diagnosis L knee OA   Referring Provider Gorden Harms, MD   Next MD Visit May 19th       Today's Treatment:  Therex: L HS, piri, PF, glute stretch x 30 sec  Bridging x 10 reps  Bridging with hip isometric / ER with blue TB 3 x 5 reps  B sidelying clam shell with blue TB x 15 reps  Side stepping with blue TB 2 x 20 ft   Monster walk with blue TB 2 x 20 ft  Fitter hip extension, abduction (1 blue / 1 black) x 10 reps each side  L 4" eccentric lowering 1 pole support x 10 reps  L 6" step up with black TB around knee x 15 reps  HEP hip strengthening review        PT Long Term Goals - 10/15/15 1551    PT LONG TERM GOAL #1   Title Pt will be independent with HEP by 11/05/15   Status Achieved  10/15/15: Pt. independent with HEP.   PT LONG TERM GOAL #2   Title Pt will ambuate with even stride length and normal gait pattern by 11/05/15   Status On-going   PT LONG TERM GOAL #3   Title Pt will demonstrate L hip and knee  strength at least 4+/5 or greater by 11/05/15   Status On-going   PT LONG TERM GOAL #4   Title Pt will be able to complete job tasks and household chores with L knee pain no greater than 4/10 by 11/05/15   Status On-going               Plan - 10/15/15 1619    Clinical Impression Statement Pt. performed well with supine and standing hip / knee strengthening activity today with no L knee pain increase with therex.  Pt. able to progress to more single leg stance activitiy today without pain.     PT Treatment/Interventions Patient/family education;Therapeutic exercise;Manual techniques;Taping;Therapeutic activities;Neuromuscular re-education;Electrical Stimulation;Cryotherapy;Vasopneumatic Device;Iontophoresis 4mg /ml Dexamethasone;Gait training;ADLs/Self Care Home Management   PT Next Visit Plan L hip and knee flexibility and strengthening; Modalities PRN for pain management      Patient will benefit from skilled therapeutic intervention in order to improve  the following deficits and impairments:  Pain, Impaired flexibility, Decreased strength, Difficulty walking, Abnormal gait, Decreased activity tolerance, Increased edema  Visit Diagnosis: Pain in left knee  Difficulty in walking, not elsewhere classified  Stiffness of left knee, not elsewhere classified     Problem List Patient Active Problem List   Diagnosis Date Noted  . Left knee pain 04/19/2014  . BRONCHITIS, ACUTE 07/21/2010    Bess Harvest, PTA 10/15/2015, 6:29 PM  [facility] [address]  [place], [place], [address] Phone: [phone]   Fax:  [phone]  Name: Joanna Willis MRN: [id number] Date of Birth: [birth year]

## 2015-10-17 ENCOUNTER — Ambulatory Visit: Payer: 59 | Admitting: Physical Therapy

## 2015-10-17 DIAGNOSIS — R262 Difficulty in walking, not elsewhere classified: Secondary | ICD-10-CM | POA: Diagnosis not present

## 2015-10-17 DIAGNOSIS — M25662 Stiffness of left knee, not elsewhere classified: Secondary | ICD-10-CM

## 2015-10-17 DIAGNOSIS — M25562 Pain in left knee: Secondary | ICD-10-CM | POA: Diagnosis not present

## 2015-10-17 NOTE — Therapy (Signed)
[facility] [address]  [place], [place], [address] Phone: [phone]   Fax:  [phone]  Physical Therapy Treatment  Patient Details  Name: Joanna Willis MRN: [id number] Date of Birth: Oct 02, 1955 Referring Provider: Gorden Harms, MD  Encounter Date: 10/17/2015      PT End of Session - 10/17/15 0808    Visit Number 4   Number of Visits 8   Date for PT Re-Evaluation 11/05/15   PT Start Time 0800   PT Stop Time 0846   PT Time Calculation (min) 46 min   Activity Tolerance Patient tolerated treatment well   Behavior During Therapy [facility] for tasks assessed/performed      Past Medical History  Diagnosis Date  . GERD (gastroesophageal reflux disease)   . Hypercholesteremia   . Depression   . Postmenopausal   . Wears glasses     Past Surgical History  Procedure Laterality Date  . Cesarean section  2000  . Abdominal hysterectomy  2005    did not remove ovaries  . Colonoscopy    . Foot arthrodesis  1995    right  . Knee arthroscopy with medial menisectomy Left 05/17/2014    Procedure: LEFT ARTHROSCOPY KNEE WITH PARTIAL MEDIAL MENISECTOMY, CHONDROPLASTY OF LATERAL FEMORAL CONDYLE;  Surgeon: Alta Corning, MD;  Location: [place];  Service: Orthopedics;  Laterality: Left;    There were no vitals filed for this visit.      Subjective Assessment - 10/17/15 0805    Subjective Pt feels knee is already so much better, walking without a limp and feels stride length is becoming more even. States hamstring is more limiting at this point.   Currently in Pain? Yes   Pain Score 2   3-5/10 when sitting   Pain Location Leg  hamstring   Pain Orientation Posterior   Pain Descriptors / Indicators Sharp          Today's Treatment:  TherEx Rec Bike - lvl 2 x 4'  Manual L HS + Gastroc & piriformis stretches 2x30" STM/strumming to medial proximal hamstrings and piriformis in prone with knee  flexed and feet supported on bolster  Pt instructed in self STM using ball (green 500 Gr med ball) for hamstring and piriformis.  TherEx Seated & supine figure 4 piriformis stretch 2x30" each Bidge + Hip isometric ABD wth blue TB x10 Bridge + Alternating Hip ABD/ER with blue TB x10 B sidelying clam shell with blue TB x 15 reps  Straight leg bridge with feet on or orange (55 cm) Pball x10 BATCA B Hamstring curl 15# 2x10 Fitter Hip extension, abduction (1 blue /1 black) x 10 reps each side        PT Education - 10/17/15 0844    Education provided Yes   Education Details Seated & supine figure 4 piriformis stretches; Self STM with small ball for hamstrings and piriformis; Blue TB provided for supine HEP   Person(s) Educated Patient   Methods Explanation;Demonstration   Comprehension Verbalized understanding;Returned demonstration             PT Long Term Goals - 10/15/15 1551    PT LONG TERM GOAL #1   Title Pt will be independent with HEP by 11/05/15   Status Achieved  10/15/15: Pt. independent with HEP.   PT LONG TERM GOAL #2   Title Pt will ambuate with even stride length and normal gait pattern by 11/05/15   Status On-going  PT LONG TERM GOAL #3   Title Pt will demonstrate L hip and knee strength at least 4+/5 or greater by 11/05/15   Status On-going   PT LONG TERM GOAL #4   Title Pt will be able to complete job tasks and household chores with L knee pain no greater than 4/10 by 11/05/15   Status On-going               Plan - 10/17/15 0846    Clinical Impression Statement Pt reporting significant improvement in L knee pain with therapy so far, with decreased limp noted with gait and improving stride length. Current limitaiton seems to be more L hamstring and to lesser degree piriformis pain/tightness, therefore targeted STM and stretching for these areas along with light strengthening with relevant stretches and STM with small ball added to HEP.   PT  Treatment/Interventions Patient/family education;Therapeutic exercise;Manual techniques;Taping;Therapeutic activities;Neuromuscular re-education;Electrical Stimulation;Cryotherapy;Vasopneumatic Device;Iontophoresis 4mg /ml Dexamethasone;Gait training;ADLs/Self Care Home Management   PT Next Visit Plan L hip and knee flexibility and strengthening; Modalities PRN for pain management   Consulted and Agree with Plan of Care Patient      Patient will benefit from skilled therapeutic intervention in order to improve the following deficits and impairments:  Pain, Impaired flexibility, Decreased strength, Difficulty walking, Abnormal gait, Decreased activity tolerance, Increased edema  Visit Diagnosis: Pain in left knee  Difficulty in walking, not elsewhere classified  Stiffness of left knee, not elsewhere classified     Problem List Patient Active Problem List   Diagnosis Date Noted  . Left knee pain 04/19/2014  . BRONCHITIS, ACUTE 07/21/2010    Percival Spanish, PT, MPT 10/17/2015, 9:37 AM  [facility] [address]  [place], [place], [address] Phone: [phone]   Fax:  [phone]  Name: Joanna Willis MRN: [id number] Date of Birth: [birth year]

## 2015-10-22 ENCOUNTER — Ambulatory Visit: Payer: 59 | Attending: Orthopedic Surgery

## 2015-10-22 DIAGNOSIS — M25662 Stiffness of left knee, not elsewhere classified: Secondary | ICD-10-CM | POA: Insufficient documentation

## 2015-10-22 DIAGNOSIS — M25562 Pain in left knee: Secondary | ICD-10-CM | POA: Diagnosis not present

## 2015-10-22 DIAGNOSIS — R262 Difficulty in walking, not elsewhere classified: Secondary | ICD-10-CM | POA: Diagnosis not present

## 2015-10-22 MED FILL — ESTRADIOL 0.05 MG PATCH: 0.05 | 28 days supply | Qty: 8 | Fill #0 | Status: TO

## 2015-10-22 NOTE — Therapy (Signed)
[facility] [address]  [place], [place], [address] Phone: [phone]   Fax:  [phone]  Physical Therapy Treatment  Patient Details  Name: Joanna Willis MRN: [id number] Date of Birth: 12-Apr-1956 Referring Provider: Gorden Harms, MD  Encounter Date: 10/22/2015      PT End of Session - 10/22/15 1720    Visit Number 5   Number of Visits 8   Date for PT Re-Evaluation 11/05/15   PT Start Time 1532   PT Stop Time 1615   PT Time Calculation (min) 43 min   Activity Tolerance Patient tolerated treatment well   Behavior During Therapy [facility] for tasks assessed/performed      Past Medical History  Diagnosis Date  . GERD (gastroesophageal reflux disease)   . Hypercholesteremia   . Depression   . Postmenopausal   . Wears glasses     Past Surgical History  Procedure Laterality Date  . Cesarean section  2000  . Abdominal hysterectomy  2005    did not remove ovaries  . Colonoscopy    . Foot arthrodesis  1995    right  . Knee arthroscopy with medial menisectomy Left 05/17/2014    Procedure: LEFT ARTHROSCOPY KNEE WITH PARTIAL MEDIAL MENISECTOMY, CHONDROPLASTY OF LATERAL FEMORAL CONDYLE;  Surgeon: Alta Corning, MD;  Location: [place];  Service: Orthopedics;  Laterality: Left;    There were no vitals filed for this visit.      Subjective Assessment - 10/22/15 1537    Subjective Pt. reports being sore and feeling sick for 2-4 hrs following STM on Wednesday.     Patient Stated Goals "Be more flexible and walk with even stride."   Currently in Pain? No/denies   Pain Score 0-No pain   Multiple Pain Sites No      Today's Treatment:  Manual:  Glute / piriformis roll with foam x 1 min each leg   Therex: L HS, piri, PF, glute stretch x 30 sec  Seated modified piriformis stretch x 30 sec each SL bridge x 10 reps Bridging with hip isometric / ER with blue TB x 10 reps each side  B  sidelying clam shell with blue TB x 15 reps  HS + bridge with heels on peanut p-ball x 10 reps Side stepping with green TB 2 x 20 ft  Monster walk with green TB 2 x 20 ft  Fitter hip extension, abduction (1 black) x 10 reps each side           PT Long Term Goals - 10/15/15 1551    PT LONG TERM GOAL #1   Title Pt will be independent with HEP by 11/05/15   Status Achieved  10/15/15: Pt. independent with HEP.   PT LONG TERM GOAL #2   Title Pt will ambuate with even stride length and normal gait pattern by 11/05/15   Status On-going   PT LONG TERM GOAL #3   Title Pt will demonstrate L hip and knee strength at least 4+/5 or greater by 11/05/15   Status On-going   PT LONG TERM GOAL #4   Title Pt will be able to complete job tasks and household chores with L knee pain no greater than 4/10 by 11/05/15   Status On-going               Plan - 10/22/15 1717    Clinical Impression Statement Pt. reports feeling L HS soreness and feeling under  the weather 4 hrs following last treatments STM to L HS and L piriformis.  Pt. tolerated all hip / knee strengthening activity well today with no complaints; foam roller to B piriformis in seated position added today with pt. tolerating well.     PT Treatment/Interventions Patient/family education;Therapeutic exercise;Manual techniques;Taping;Therapeutic activities;Neuromuscular re-education;Electrical Stimulation;Cryotherapy;Vasopneumatic Device;Iontophoresis 4mg /ml Dexamethasone;Gait training;ADLs/Self Care Home Management   PT Next Visit Plan L hip and knee flexibility and strengthening; Modalities PRN for pain management      Patient will benefit from skilled therapeutic intervention in order to improve the following deficits and impairments:  Pain, Impaired flexibility, Decreased strength, Difficulty walking, Abnormal gait, Decreased activity tolerance, Increased edema  Visit Diagnosis: Pain in left knee  Difficulty in walking, not  elsewhere classified  Stiffness of left knee, not elsewhere classified     Problem List Patient Active Problem List   Diagnosis Date Noted  . Left knee pain 04/19/2014  . BRONCHITIS, ACUTE 07/21/2010    Bess Harvest, PTA 10/22/2015, 5:21 PM  [facility] [address]  [place], [place], [address] Phone: [phone]   Fax:  [phone]  Name: Joanna Willis MRN: [id number] Date of Birth: 06-07-1956

## 2015-10-24 ENCOUNTER — Ambulatory Visit: Payer: 59

## 2015-10-24 DIAGNOSIS — M25662 Stiffness of left knee, not elsewhere classified: Secondary | ICD-10-CM | POA: Diagnosis not present

## 2015-10-24 DIAGNOSIS — M25562 Pain in left knee: Secondary | ICD-10-CM | POA: Diagnosis not present

## 2015-10-24 DIAGNOSIS — R262 Difficulty in walking, not elsewhere classified: Secondary | ICD-10-CM | POA: Diagnosis not present

## 2015-10-24 NOTE — Therapy (Signed)
[facility] [address]  [place], [place], [address] Phone: [phone]   Fax:  [phone]  Physical Therapy Treatment  Patient Details  Name: Joanna Willis MRN: [id number] Date of Birth: 1955/08/12 Referring Provider: Gorden Harms, MD  Encounter Date: 10/24/2015      PT End of Session - 10/24/15 0818    Visit Number 6   Number of Visits 8   Date for PT Re-Evaluation 11/05/15   PT Start Time 0805   PT Stop Time 0845   PT Time Calculation (min) 40 min   Activity Tolerance Patient tolerated treatment well   Behavior During Therapy [facility] for tasks assessed/performed      Past Medical History  Diagnosis Date  . GERD (gastroesophageal reflux disease)   . Hypercholesteremia   . Depression   . Postmenopausal   . Wears glasses     Past Surgical History  Procedure Laterality Date  . Cesarean section  2000  . Abdominal hysterectomy  2005    did not remove ovaries  . Colonoscopy    . Foot arthrodesis  1995    right  . Knee arthroscopy with medial menisectomy Left 05/17/2014    Procedure: LEFT ARTHROSCOPY KNEE WITH PARTIAL MEDIAL MENISECTOMY, CHONDROPLASTY OF LATERAL FEMORAL CONDYLE;  Surgeon: Alta Corning, MD;  Location: [place];  Service: Orthopedics;  Laterality: Left;    There were no vitals filed for this visit.      Subjective Assessment - 10/24/15 0808    Subjective Pt. reports 0/10 L knee pain today however reports she was sore at the L knee following the HS + bridge combo   Patient Stated Goals "Be more flexible and walk with even stride."   Currently in Pain? No/denies   Pain Score 0-No pain   Multiple Pain Sites No     Today's Treatment:  Therex: L HS, piri, glute, SKTC stretch x 30 sec   SL bridge x 10 reps Hooklying alternating hip abd/ER with black TB x 10 reps  Bridging with hip isometric / ER with black TB 3 x 5 reps each side   B sidelying clam shell with black  TB x 15 reps   Side stepping with green TB 2 x 20 ft    Monster walk with green TB 2 x 20 ft   TRX squat ~ 90 dg x 10 reps; pt. symmetrical weight shift, good hip / knee control  B Fitter hip extension (1 blue / 1 black), abduction (1 black) x 10 reps each side    MMT assessment      [facility] PT Assessment - 10/24/15 1823    Strength   Left Hip Flexion 4+/5   Left Hip Extension 4+/5   Left Hip ABduction 4+/5   Left Hip ADduction 4+/5   Left Knee Flexion 3+/5   Left Knee Extension 4+/5           PT Long Term Goals - 10/24/15 0836    PT LONG TERM GOAL #1   Title Pt will be independent with HEP by 11/05/15   Status Achieved  10/15/15: Pt. independent with HEP.   PT LONG TERM GOAL #2   Title Pt will ambuate with even stride length and normal gait pattern by 11/05/15   Status Achieved  10/24/15: Pt. able to ambulate ith even stride length and normal gait pattern.    PT LONG TERM GOAL #3   Title Pt will demonstrate  L hip and knee strength at least 4+/5 or greater by 11/05/15   Status Partially Met  5/317: Pt. able to 3+/5 L knee flexion, 4+/5 L knee ext. ; L hip ext 4+/5, flexion 4+ /5, 4+/5 abduction, 4+/5 adduction.   PT LONG TERM GOAL #4   Title Pt will be able to complete job tasks and household chores with L knee pain no greater than 4/10 by 11/05/15   Status Partially Met  10/24/15: Pt. reports she is able to complete job tasks and houshold chores with significantly less L knee pain however still at 5/10 pain at worse               Plan - 10/24/15 1819    Clinical Impression Statement Pt. tolerated all L hip / knee strengthening activity well today however reports being sore from HS + bridge combo activity last treatment; however pt. continues to report L HS sensitivity / soreness in middle of HS belly only able to achieve 3+/5 L knee flexion strength today secondary to L HS pain.  All other L hip / knee strength 4+/5 or greater today.     PT Treatment/Interventions  Patient/family education;Therapeutic exercise;Manual techniques;Taping;Therapeutic activities;Neuromuscular re-education;Electrical Stimulation;Cryotherapy;Vasopneumatic Device;Iontophoresis 39m/ml Dexamethasone;Gait training;ADLs/Self Care Home Management   PT Next Visit Plan L hip and knee flexibility and strengthening; Modalities PRN for pain management      Patient will benefit from skilled therapeutic intervention in order to improve the following deficits and impairments:  Pain, Impaired flexibility, Decreased strength, Difficulty walking, Abnormal gait, Decreased activity tolerance, Increased edema  Visit Diagnosis: Pain in left knee  Difficulty in walking, not elsewhere classified  Stiffness of left knee, not elsewhere classified     Problem List Patient Active Problem List   Diagnosis Date Noted  . Left knee pain 04/19/2014  . BRONCHITIS, ACUTE 07/21/2010    MBess Harvest PTA 10/24/2015, 6:26 PM  [facility] [place] [place] [address]Phone: [phone]  Fax:  [phone] Name: Joanna FLEISHMANMRN: [id number] of Birth: 102-May-1957

## 2015-10-29 ENCOUNTER — Ambulatory Visit: Payer: 59

## 2015-10-29 DIAGNOSIS — R262 Difficulty in walking, not elsewhere classified: Secondary | ICD-10-CM | POA: Diagnosis not present

## 2015-10-29 DIAGNOSIS — M25662 Stiffness of left knee, not elsewhere classified: Secondary | ICD-10-CM

## 2015-10-29 DIAGNOSIS — M25562 Pain in left knee: Secondary | ICD-10-CM | POA: Diagnosis not present

## 2015-10-29 NOTE — Therapy (Signed)
[facility] [address]  [place], [place], [address] Phone: [phone]   Fax:  [phone]  Physical Therapy Treatment  Patient Details  Name: Joanna Willis MRN: [id number] Date of Birth: [birth year] Referring Provider: Gorden Harms, MD  Encounter Date: 10/29/2015      PT End of Session - 10/29/15 1540    Visit Number 7   Number of Visits 8   Date for PT Re-Evaluation 11/05/15   PT Start Time 5638   PT Stop Time 1619   PT Time Calculation (min) 40 min   Activity Tolerance Patient tolerated treatment well   Behavior During Therapy [facility] for tasks assessed/performed      Past Medical History  Diagnosis Date  . GERD (gastroesophageal reflux disease)   . Hypercholesteremia   . Depression   . Postmenopausal   . Wears glasses     Past Surgical History  Procedure Laterality Date  . Cesarean section  2000  . Abdominal hysterectomy  2005    did not remove ovaries  . Colonoscopy    . Foot arthrodesis  1995    right  . Knee arthroscopy with medial menisectomy Left 05/17/2014    Procedure: LEFT ARTHROSCOPY KNEE WITH PARTIAL MEDIAL MENISECTOMY, CHONDROPLASTY OF LATERAL FEMORAL CONDYLE;  Surgeon: Alta Corning, MD;  Location: [place] [facility];  Service: Orthopedics;  Laterality: Left;    There were no vitals filed for this visit.      Subjective Assessment - 10/29/15 1545    Subjective Pt. reports she is pain free today however is still unable to pull off R shoe with L LE at night without pain in L hamstring.     Patient Stated Goals "Be more flexible and walk with even stride."   Currently in Pain? No/denies   Pain Score 0-No pain   Multiple Pain Sites No      Today's Treatment:  Therex: L HS, piri, glute, SKTC stretch x 30 sec  SL bridge x 10 reps Hooklying bridge with hip isometric / ER with black TB x 10 each side  Bridge + HS combo with heels on peanut p-ball x 10 reps B sidelying  clam shell with black TB x 10 reps  Side stepping with blue TB 2 x 20 ft  Monster walk with blue TB 2 x 20 ft    Manual: Pt. Self foam roller to L HS x 1 min  STM with foam roller to L HS x 2 min  STM with "stick" to L HS x 2 min   MMT assessment:         PT Long Term Goals - 10/29/15 1602    PT LONG TERM GOAL #1   Title Pt will be independent with HEP by 11/05/15   Status Achieved  10/15/15: Pt. independent with HEP.   PT LONG TERM GOAL #2   Title Pt will ambuate with even stride length and normal gait pattern by 11/05/15   Status Achieved  10/24/15: Pt. able to ambulate ith even stride length and normal gait pattern.    PT LONG TERM GOAL #3   Title Pt will demonstrate L hip and knee strength at least 4+/5 or greater by 11/05/15   Status Partially Met  5/317: Pt. able to 3+/5 L knee flexion, 4+/5 L knee ext. ; L hip ext 4+/5, flexion 4+ /5, 4+/5 abduction, 4+/5 adduction.   PT LONG TERM GOAL #4   Title Pt will  be able to complete job tasks and household chores with L knee pain no greater than 4/10 by 11/05/15   Status Achieved  10/29/15: Pt. reports she is able to complete job tasks and houshold chores with significantly less L knee pain however still at 2/10 pain at worse.               Plan - 10/29/15 1542    Clinical Impression Statement Pt. report she is now able to clean house, perform chores / work duties at 2/10 L knee pain at worst.  Pt. has achieved all goals with exception of L knee flexion strength; pt. continues to demo TTP in L HS; pt. reports 5/10 L HS pain with max effort L knee flexion.  Pt. tolerated all hip / knee strengthening therex very well with exception of max effort knee flexion strengthening activities; with which she is limited by L HS pain.  Pt. last scheduled treatment on 5/10.     PT Treatment/Interventions Patient/family education;Therapeutic exercise;Manual techniques;Taping;Therapeutic activities;Neuromuscular re-education;Electrical  Stimulation;Cryotherapy;Vasopneumatic Device;Iontophoresis 32m/ml Dexamethasone;Gait training;ADLs/Self Care Home Management   PT Next Visit Plan d/c pending PT assessment.        Patient will benefit from skilled therapeutic intervention in order to improve the following deficits and impairments:  Pain, Impaired flexibility, Decreased strength, Difficulty walking, Abnormal gait, Decreased activity tolerance, Increased edema  Visit Diagnosis: Pain in left knee  Difficulty in walking, not elsewhere classified  Stiffness of left knee, not elsewhere classified     Problem List Patient Active Problem List   Diagnosis Date Noted  . Left knee pain 04/19/2014  . BRONCHITIS, ACUTE 07/21/2010    MBess Harvest PTA 10/29/2015, 4:36 PM  [facility] [place] [address]Phone: [phone]  Fax:  [phone] Name: Joanna BATTAGLIAMRN: [id number] of Birth: [birth year]

## 2015-10-31 ENCOUNTER — Ambulatory Visit: Payer: 59 | Admitting: Physical Therapy

## 2015-10-31 DIAGNOSIS — R262 Difficulty in walking, not elsewhere classified: Secondary | ICD-10-CM | POA: Diagnosis not present

## 2015-10-31 DIAGNOSIS — M25562 Pain in left knee: Secondary | ICD-10-CM

## 2015-10-31 DIAGNOSIS — M25662 Stiffness of left knee, not elsewhere classified: Secondary | ICD-10-CM | POA: Diagnosis not present

## 2015-10-31 NOTE — Therapy (Addendum)
[facility] [address]  [place], [place], [address] Phone: [phone]   Fax:  [phone]  Physical Therapy Treatment  Patient Details  Name: Joanna Willis MRN: [id number] Date of Birth: [birth year] Referring Provider: Gorden Harms, MD  Encounter Date: 10/31/2015      PT End of Session - 10/31/15 0809    Visit Number 8   Number of Visits 8   Date for PT Re-Evaluation 11/05/15   PT Start Time 0803   PT Stop Time 0849   PT Time Calculation (min) 46 min   Activity Tolerance Patient tolerated treatment well   Behavior During Therapy [facility] for tasks assessed/performed      Past Medical History  Diagnosis Date  . GERD (gastroesophageal reflux disease)   . Hypercholesteremia   . Depression   . Postmenopausal   . Wears glasses     Past Surgical History  Procedure Laterality Date  . Cesarean section  2000  . Abdominal hysterectomy  2005    did not remove ovaries  . Colonoscopy    . Foot arthrodesis  1995    right  . Knee arthroscopy with medial menisectomy Left 05/17/2014    Procedure: LEFT ARTHROSCOPY KNEE WITH PARTIAL MEDIAL MENISECTOMY, CHONDROPLASTY OF LATERAL FEMORAL CONDYLE;  Surgeon: Alta Corning, MD;  Location: [place];  Service: Orthopedics;  Laterality: Left;    There were no vitals filed for this visit.      Subjective Assessment - 10/31/15 0808    Subjective Pt reports she wore [employer] clogs to work for the last 2 days and noted increased L anterior knee pain that she had not previously experienced and thinks it was from the shoes. Overall knee pain has been much better but still feels limited by the hamstring.   Patient Stated Goals "Be more flexible and walk with even stride."   Currently in Pain? No/denies            [facility] PT Assessment - 10/31/15 0803    Assessment   Medical Diagnosis L knee OA   Referring Provider Gorden Harms, MD   Next MD Visit May 19th     Strength   Strength Assessment Site Hip;Knee   Right/Left Hip Right;Left   Right Hip Flexion 5/5   Right Hip Extension 4+/5   Right Hip ABduction 5/5   Right Hip ADduction 5/5   Left Hip Flexion 5/5  5-/5   Left Hip Extension 4+/5   Left Hip ABduction 4+/5   Left Hip ADduction 4+/5   Right/Left Knee Right;Left   Right Knee Flexion 5/5   Right Knee Extension 5/5   Left Knee Flexion 3+/5  Limited ability to hold w/ resistance d/t hamstring pain   Left Knee Extension 4+/5   Flexibility   Hamstrings mildly tight on L relative to R   Quadriceps mildly tight on L relative to R   ITB WFL   Piriformis WFL   Ambulation/Gait   Gait Pattern Within Functional Limits   Stairs Yes   Stairs Assistance 7: Independent   Stair Management Technique No rails;Alternating pattern   Number of Stairs 12   Height of Stairs 7         Today's Treatment  TherEx Rec Bike - interval lvl 3-5 x 4'  MMT/flexibility assessment  Manual Supine hamstring stretch 2x30" STM with foam roller to L HS x 2 min  STM with "stick" to L HS x 2 min  TherEx Review of current HEP Bridge on heels x10 L seated HS curl with red TB x10         PT Long Term Goals - 10/31/15 0817    PT LONG TERM GOAL #1   Title Pt will be independent with HEP by 11/05/15   Status Achieved   PT LONG TERM GOAL #2   Title Pt will ambuate with even stride length and normal gait pattern by 11/05/15   Status Achieved   PT LONG TERM GOAL #3   Title Pt will demonstrate L hip and knee strength at least 4+/5 or greater by 11/05/15   Status Partially Met  Met except 3+/5 L knee flexion (limited by HS pain with resistance)   PT LONG TERM GOAL #4   Title Pt will be able to complete job tasks and household chores with L knee pain no greater than 4/10 by 11/05/15   Status Achieved  Pt. reports she is able to complete job tasks and houshold chores including cleaning the house with significantly less L knee pain, however still can be  up to 2/10 pain at worst.               Plan - 10/31/15 1332    Clinical Impression Statement Pt reporting significant improvement since start of PT, with no recent L knee pain except when she attempted to wear [employer] clogs to work. Pt noting walking feels normal again and she is now able to complete household chores and work duties often without pain and no greater than 2/10 in L hamstrings when present. Strength has improved throughout the LE's with the exception on L knee flexion still 3+/5 due to hamstring pain with resistance.  All goals met with exception of L knee flexion strength.  Pt is pleased with progress and wants to try to continue on her own with the HEP, therefore HEP reviewed and updated to continue emphasis on L hamstring flexibility and strengthening. Pt will be placed on hold x 30 days, with recert if needs to return and otherwise will proceed with D/C from PT if no need to return within 30 days.   PT Treatment/Interventions Patient/family education;Therapeutic exercise;Manual techniques;Taping;Therapeutic activities;Neuromuscular re-education;Electrical Stimulation;Cryotherapy;Vasopneumatic Device;Iontophoresis 81m/ml Dexamethasone;Gait training;ADLs/Self Care Home Management   PT Next Visit Plan 30 day hold; Recert if needs to return, otherwise D/C after 30 days   Consulted and Agree with Plan of Care Patient      Patient will benefit from skilled therapeutic intervention in order to improve the following deficits and impairments:  Pain, Impaired flexibility, Decreased strength, Difficulty walking, Abnormal gait, Decreased activity tolerance, Increased edema  Visit Diagnosis: Pain in left knee  Difficulty in walking, not elsewhere classified  Stiffness of left knee, not elsewhere classified     Problem List Patient Active Problem List   Diagnosis Date Noted  . Left knee pain 04/19/2014  . BRONCHITIS, ACUTE 07/21/2010    JPercival Spanish PT, MPT 10/31/2015,  1:52 PM  [facility] [place] [address]Phone: [phone]  Fax:  [phone] Name: Joanna SANTIESTEBANMRN: [id number] of Birth: [birth year]  PHYSICAL THERAPY DISCHARGE SUMMARY  Visits from Start of Care: 8  Current functional level related to goals / functional outcomes:   As of last visit, pt reporting significant improvement since start of PT, with no recent L knee pain except when she attempted to wear [employer] clogs to  work. Pt noted walking feels normal again and she was able to complete household chores and work duties often without pain and no greater than 2/10 in L hamstrings when present. Strength had improved throughout the LE's with the exception on L knee flexion still 3+/5 due to hamstring pain with resistance. All goals met with exception of L knee flexion strength. Pt was pleased with progress and wanted to try to continue on her own with the HEP, therefore was placed on hold x 30 days. Pt has not needed to return in >30 days, therefore will proceed with D/C from PT for this episode.   Remaining deficits:   Pt to continue with HEP for LE strengthening, especially hamstrings.   Education / Equipment:   HEP  Plan: Patient agrees to discharge.  Patient goals were partially met. Patient is being discharged due to being pleased with the current functional level.  ?????       Percival Spanish, PT, MPT 12/03/2015, 9:35 AM  [facility] [address]  [place], [place], [address] Phone: [phone]   Fax:  [phone]

## 2015-11-05 ENCOUNTER — Ambulatory Visit: Payer: 59

## 2015-11-07 ENCOUNTER — Ambulatory Visit: Payer: 59 | Admitting: Physical Therapy

## 2015-11-09 DIAGNOSIS — M1712 Unilateral primary osteoarthritis, left knee: Secondary | ICD-10-CM | POA: Diagnosis not present

## 2015-12-06 MED FILL — ESTRADIOL 0.05 MG PATCH: 0.05 | 28 days supply | Qty: 8 | Fill #0

## 2015-12-14 MED FILL — SIMVASTATIN 10 MG TABLET: 10 | 90 days supply | Qty: 90 | Fill #0 | Status: TO

## 2015-12-21 DIAGNOSIS — M1712 Unilateral primary osteoarthritis, left knee: Secondary | ICD-10-CM | POA: Diagnosis not present

## 2016-01-04 MED FILL — OMEPRAZOLE DR 20 MG CAPSULE: 20 | 90 days supply | Qty: 90 | Fill #1

## 2016-01-14 MED FILL — ESTRADIOL 0.05 MG PATCH: 0.05 | 84 days supply | Qty: 24 | Fill #0

## 2016-01-15 MED FILL — MELOXICAM 15 MG TABLET: 15 | 90 days supply | Qty: 90 | Fill #2

## 2016-02-15 DIAGNOSIS — M1712 Unilateral primary osteoarthritis, left knee: Secondary | ICD-10-CM | POA: Diagnosis not present

## 2016-02-22 DIAGNOSIS — K219 Gastro-esophageal reflux disease without esophagitis: Secondary | ICD-10-CM | POA: Diagnosis not present

## 2016-02-22 DIAGNOSIS — F4323 Adjustment disorder with mixed anxiety and depressed mood: Secondary | ICD-10-CM | POA: Diagnosis not present

## 2016-02-22 DIAGNOSIS — E785 Hyperlipidemia, unspecified: Secondary | ICD-10-CM | POA: Diagnosis not present

## 2016-02-22 DIAGNOSIS — Z0000 Encounter for general adult medical examination without abnormal findings: Secondary | ICD-10-CM | POA: Diagnosis not present

## 2016-03-13 MED FILL — SIMVASTATIN 10 MG TABLET: 10 | 90 days supply | Qty: 90 | Fill #0

## 2016-04-07 MED FILL — OMEPRAZOLE DR 20 MG CAPSULE: 20 | 90 days supply | Qty: 90 | Fill #2

## 2016-04-18 DIAGNOSIS — Z1231 Encounter for screening mammogram for malignant neoplasm of breast: Secondary | ICD-10-CM | POA: Diagnosis not present

## 2016-04-24 MED FILL — FLUTICASONE PROP 50 MCG SPR: 50 | 30 days supply | Qty: 16 | Fill #1

## 2016-05-19 MED FILL — ESTRADIOL 0.05 MG PATCH: 0.05 | 84 days supply | Qty: 24 | Fill #1

## 2016-05-30 DIAGNOSIS — M1712 Unilateral primary osteoarthritis, left knee: Secondary | ICD-10-CM | POA: Diagnosis not present

## 2016-06-12 MED FILL — SIMVASTATIN 10 MG TABLET: 10 | 90 days supply | Qty: 90 | Fill #1

## 2016-06-25 DIAGNOSIS — M1712 Unilateral primary osteoarthritis, left knee: Secondary | ICD-10-CM | POA: Diagnosis not present

## 2016-07-02 DIAGNOSIS — M1712 Unilateral primary osteoarthritis, left knee: Secondary | ICD-10-CM | POA: Diagnosis not present

## 2016-07-11 DIAGNOSIS — M1712 Unilateral primary osteoarthritis, left knee: Secondary | ICD-10-CM | POA: Diagnosis not present

## 2016-07-21 MED FILL — OMEPRAZOLE DR 20 MG CAPSULE: 20 | 90 days supply | Qty: 90 | Fill #3

## 2016-08-05 MED FILL — CITALOPRAM HBR 20 MG TABLET: 20 | 90 days supply | Qty: 90 | Fill #1

## 2016-08-20 DIAGNOSIS — M1712 Unilateral primary osteoarthritis, left knee: Secondary | ICD-10-CM | POA: Diagnosis not present

## 2016-08-20 MED FILL — MELOXICAM 7.5 MG TABLET: 7.5 | 20 days supply | Qty: 20 | Fill #0

## 2016-08-21 MED FILL — FLUTICASONE PROP 50 MCG SPR: 50 | 30 days supply | Qty: 16 | Fill #2

## 2016-09-11 DIAGNOSIS — M1712 Unilateral primary osteoarthritis, left knee: Secondary | ICD-10-CM | POA: Diagnosis not present

## 2016-09-11 MED FILL — MELOXICAM 15 MG TABLET: 15 | 30 days supply | Qty: 30 | Fill #0

## 2016-09-12 ENCOUNTER — Other Ambulatory Visit: Payer: Self-pay | Admitting: Orthopedic Surgery

## 2016-09-12 DIAGNOSIS — M1712 Unilateral primary osteoarthritis, left knee: Secondary | ICD-10-CM

## 2016-09-17 MED FILL — ESTRADIOL 0.05 MG PATCH: 0.05 | 84 days supply | Qty: 24 | Fill #0 | Status: TO

## 2016-09-17 MED FILL — SIMVASTATIN 10 MG TABLET: 10 | 90 days supply | Qty: 90 | Fill #0

## 2016-09-19 ENCOUNTER — Ambulatory Visit
Admission: RE | Admit: 2016-09-19 | Discharge: 2016-09-19 | Disposition: A | Discharge status: Home / Self Care | Payer: 59 | Source: Ambulatory Visit | Attending: Orthopedic Surgery | Admitting: Orthopedic Surgery

## 2016-09-19 DIAGNOSIS — M1712 Unilateral primary osteoarthritis, left knee: Secondary | ICD-10-CM

## 2016-09-19 DIAGNOSIS — Z96652 Presence of left artificial knee joint: Secondary | ICD-10-CM | POA: Diagnosis not present

## 2016-09-19 DIAGNOSIS — Z471 Aftercare following joint replacement surgery: Secondary | ICD-10-CM | POA: Diagnosis not present

## 2016-10-13 ENCOUNTER — Other Ambulatory Visit: Payer: Self-pay | Admitting: Adult Health Nurse Practitioner

## 2016-10-13 MED FILL — FLUTICASONE PROP 50 MCG SPR: 50 | 30 days supply | Qty: 16 | Fill #0

## 2016-10-14 MED FILL — MELOXICAM 15 MG TABLET: 15 | 30 days supply | Qty: 30 | Fill #1 | Status: TO

## 2016-10-20 MED FILL — OMEPRAZOLE DR 20 MG CAPSULE: 20 | 90 days supply | Qty: 90 | Fill #0 | Status: TO

## 2016-11-20 ENCOUNTER — Emergency Department
Admission: EM | Admit: 2016-11-20 | Discharge: 2016-11-20 | Disposition: A | Discharge status: Home / Self Care | Payer: 59 | Source: Home / Self Care | Attending: Family Medicine | Admitting: Family Medicine

## 2016-11-20 ENCOUNTER — Encounter: Payer: Self-pay | Admitting: *Deleted

## 2016-11-20 DIAGNOSIS — B9789 Other viral agents as the cause of diseases classified elsewhere: Secondary | ICD-10-CM | POA: Diagnosis not present

## 2016-11-20 DIAGNOSIS — J069 Acute upper respiratory infection, unspecified: Secondary | ICD-10-CM

## 2016-11-20 MED ORDER — DOXYCYCLINE HYCLATE 100 MG PO CAPS: 100.0000 mg | ORAL_CAPSULE | Freq: Two times a day (BID) | ORAL | 0 refills | Status: DC

## 2016-11-20 MED ORDER — BENZONATATE 200 MG PO CAPS: ORAL_CAPSULE | ORAL | 0 refills | Status: DC

## 2016-11-20 MED FILL — BENZONATATE 200 MG CAP: 200 | 3 days supply | Qty: 15 | Fill #0

## 2016-11-20 NOTE — Discharge Instructions (Signed)
Take plain guaifenesin (1200mg  extended release tabs such as Mucinex) twice daily, with plenty of water, for cough and congestion.  Get adequate rest.   May use Afrin nasal spray (or generic oxymetazoline) each morning for about 5 days and then discontinue.  Also recommend using saline nasal spray several times daily and saline nasal irrigation (AYR is a common brand).  Use Flonase nasal spray each morning after using Afrin nasal spray and saline nasal irrigation. Try warm salt water gargles for sore throat.  Stop all antihistamines for now, and other non-prescription cough/cold preparations. Begin Doxycycline if not improving about one week or if persistent fever develops   Follow-up with family doctor if not improving about10 days.

## 2016-11-20 NOTE — ED Provider Notes (Signed)
Joanna Willis CARE    CSN: [id number] Arrival date & time: 11/20/16  1357     History   Chief Complaint Chief Complaint  Patient presents with  . Cough    HPI Joanna Willis is a 61 y.o. female.   Patient complains of one week history of typical cold-like symptoms developing over several days, including mild sore throat, mild nasal congestion, chills, fatigue, and cough.  Her sore throat has resolved.   The history is provided by the patient.    Past Medical History:  Diagnosis Date  . Depression   . GERD (gastroesophageal reflux disease)   . Hypercholesteremia   . Postmenopausal   . Wears glasses     Patient Active Problem List   Diagnosis Date Noted  . Left knee pain 04/19/2014  . BRONCHITIS, ACUTE 07/21/2010    Past Surgical History:  Procedure Laterality Date  . ABDOMINAL HYSTERECTOMY  2005   did not remove ovaries  . CESAREAN SECTION  2000  . COLONOSCOPY    . FOOT ARTHRODESIS  1995   right  . KNEE ARTHROSCOPY WITH MEDIAL MENISECTOMY Left 05/17/2014   Procedure: LEFT ARTHROSCOPY KNEE WITH PARTIAL MEDIAL MENISECTOMY, CHONDROPLASTY OF LATERAL FEMORAL CONDYLE;  Surgeon: Alta Corning, MD;  Location: [facility];  Service: Orthopedics;  Laterality: Left;    OB History    No data available       Home Medications    Prior to Admission medications   Medication Sig Start Date End Date Taking? Authorizing Provider  atorvastatin (LIPITOR) 10 MG tablet Take 10 mg by mouth daily.   Yes [provider]  citalopram (CELEXA) 20 MG tablet Take 20 mg by mouth daily.   Yes [provider]  meloxicam (MOBIC) 15 MG tablet Take 15 mg by mouth daily.   Yes [provider]  benzonatate (TESSALON) 200 MG capsule Take one cap by mouth at bedtime as needed for cough.  May repeat in 4 to 6 hours 11/20/16   Kandra Nicolas, MD  doxycycline (VIBRAMYCIN) 100 MG capsule Take 1 capsule (100 mg total) by mouth 2 (two) times daily.  Take with food (Rx void after 11/28/16) 11/20/16   Kandra Nicolas, MD  estradiol (VIVELLE-DOT) 0.05 MG/24HR patch Place 1 patch onto the skin 2 (two) times a week.    [provider]  fluticasone (FLONASE) 50 MCG/ACT nasal spray USE 1 SPRAY IN EACH NOSTRIL 2 TIMES A DAY 10/13/16   Doles-Johnson, Teah, NP  methocarbamol (ROBAXIN) 500 MG tablet Take 2 tablets (1,000 mg total) by mouth 4 (four) times daily as needed (Pain). 04/14/14   Pisciotta, Elmyra Ricks, PA-C  omeprazole (PRILOSEC) 20 MG capsule Take 20 mg by mouth daily.    [provider]  simvastatin (ZOCOR) 10 MG tablet Take 10 mg by mouth daily at 6 PM.    [provider]    Family History Family History  Problem Relation Age of Onset  . Colon cancer Mother 72       lived 6 months after diagnosis  . Pancreatic cancer Neg Hx   . Stomach cancer Neg Hx     Social History Social History  Substance Use Topics  . Smoking status: Never Smoker  . Smokeless tobacco: Never Used  . Alcohol use 4.2 oz/week    7 Cans of beer per week     Allergies   Penicillins   Review of Systems Review of Systems + sore throat + cough No pleuritic  pain No wheezing + mild nasal congestion + post-nasal drainage No sinus pain/pressure No itchy/red eyes ? earache No hemoptysis No SOB No fever, + chills No nausea No vomiting No abdominal pain No diarrhea No urinary symptoms No skin rash + fatigue No myalgias No headache Used OTC meds without relief   Physical Exam Triage Vital Signs ED Triage Vitals  Enc Vitals Group     BP 11/20/16 1425 (!) 134/91     Pulse Rate 11/20/16 1425 76     Resp 11/20/16 1425 16     Temp 11/20/16 1425 98.3 F (36.8 C)     Temp Source 11/20/16 1425 Oral     SpO2 11/20/16 1425 98 %     Weight 11/20/16 1426 155 lb (70.3 kg)     Height --      Head Circumference --      Peak Flow --      Pain Score 11/20/16 1426 0     Pain Loc --      Pain Edu? --      Excl. in [place]? --    No  data found.   Updated Vital Signs BP (!) 134/91 (BP Location: Left Arm)   Pulse 76   Temp 98.3 F (36.8 C) (Oral)   Resp 16   Wt 155 lb (70.3 kg)   SpO2 98%   BMI 25.02 kg/m   Visual Acuity Right Eye Distance:   Left Eye Distance:   Bilateral Distance:    Right Eye Near:   Left Eye Near:    Bilateral Near:     Physical Exam Nursing notes and Vital Signs reviewed. Appearance:  Patient appears stated age, and in no acute distress Eyes:  Pupils are equal, round, and reactive to light and accomodation.  Extraocular movement is intact.  Conjunctivae are not inflamed  Ears:  Canals normal.  Tympanic membranes normal.  Nose:  Mildly congested turbinates.  No sinus tenderness.    Pharynx:  Normal Neck:  Supple.  Tender enlarged posterior/lateral nodes are palpated bilaterally  Lungs:  Clear to auscultation.  Breath sounds are equal.  Moving air well. Heart:  Regular rate and rhythm without murmurs, rubs, or gallops.  Abdomen:  Nontender without masses or hepatosplenomegaly.  Bowel sounds are present.  No CVA or flank tenderness.  Extremities:  No edema.  Skin:  No rash present.    UC Treatments / Results  Labs (all labs ordered are listed, but only abnormal results are displayed) Labs Reviewed - No data to display  EKG  EKG Interpretation None       Radiology No results found.  Procedures Procedures (including critical care time)  Medications Ordered in UC Medications - No data to display   Initial Impression / Assessment and Plan / UC Course  I have reviewed the triage vital signs and the nursing notes.  Pertinent labs & imaging results that were available during my care of the patient were reviewed by me and considered in my medical decision making (see chart for details).    There is no evidence of bacterial infection today.   Prescription written for Benzonatate [facility]) to take at bedtime for night-time cough.  Take plain guaifenesin (1200mg  extended  release tabs such as Mucinex) twice daily, with plenty of water, for cough and congestion.  Get adequate rest.   May use Afrin nasal spray (or generic oxymetazoline) each morning for about 5 days and then discontinue.  Also recommend using saline nasal spray several  times daily and saline nasal irrigation (AYR is a common brand).  Use Flonase nasal spray each morning after using Afrin nasal spray and saline nasal irrigation. Try warm salt water gargles for sore throat.  Stop all antihistamines for now, and other non-prescription cough/cold preparations. Begin Doxycycline if not improving about one week or if persistent fever develops (Given a prescription to hold, with an expiration date)  Follow-up with family doctor if not improving about10 days.     Final Clinical Impressions(s) / UC Diagnoses   Final diagnoses:  Viral URI with cough    New Prescriptions New Prescriptions   BENZONATATE (TESSALON) 200 MG CAPSULE    Take one cap by mouth at bedtime as needed for cough.  May repeat in 4 to 6 hours   DOXYCYCLINE (VIBRAMYCIN) 100 MG CAPSULE    Take 1 capsule (100 mg total) by mouth 2 (two) times daily. Take with food (Rx void after 11/28/16)     Kandra Nicolas, MD 11/20/16 1500

## 2016-11-20 NOTE — ED Triage Notes (Signed)
Pt c/o nonproductive cough x 8 days. Denies fever.

## 2016-11-21 MED FILL — MELOXICAM 15 MG TABLET: 15 | 30 days supply | Qty: 30 | Fill #0 | Status: TO

## 2016-11-26 DIAGNOSIS — H5203 Hypermetropia, bilateral: Secondary | ICD-10-CM | POA: Diagnosis not present

## 2016-11-26 DIAGNOSIS — H52223 Regular astigmatism, bilateral: Secondary | ICD-10-CM | POA: Diagnosis not present

## 2016-11-26 DIAGNOSIS — H2513 Age-related nuclear cataract, bilateral: Secondary | ICD-10-CM | POA: Diagnosis not present

## 2016-11-26 DIAGNOSIS — H524 Presbyopia: Secondary | ICD-10-CM | POA: Diagnosis not present

## 2016-12-22 MED FILL — FLUTICASONE PROP 50 MCG SPR: 50 | 30 days supply | Qty: 16 | Fill #1 | Status: TO

## 2016-12-22 MED FILL — SIMVASTATIN 10 MG TABLET: 10 | 90 days supply | Qty: 90 | Fill #1

## 2016-12-22 MED FILL — MELOXICAM 15 MG TABLET: 15 | 30 days supply | Qty: 30 | Fill #0

## 2016-12-22 MED FILL — CITALOPRAM HBR 20 MG TABLET: 20 | 90 days supply | Qty: 90 | Fill #0

## 2016-12-29 DIAGNOSIS — Z01818 Encounter for other preprocedural examination: Secondary | ICD-10-CM | POA: Diagnosis not present

## 2016-12-29 DIAGNOSIS — R001 Bradycardia, unspecified: Secondary | ICD-10-CM | POA: Diagnosis not present

## 2016-12-30 ENCOUNTER — Ambulatory Visit: Payer: Self-pay | Admitting: Orthopedic Surgery

## 2017-01-13 ENCOUNTER — Other Ambulatory Visit (HOSPITAL_COMMUNITY): Payer: Self-pay | Admitting: Emergency Medicine

## 2017-01-13 NOTE — Patient Instructions (Signed)
Joanna Willis  01/13/2017   Your procedure is scheduled on: 01-21-17  Report to [facility] Main  Entrance   Report to admitting at 7:45AM   Call this number if you have problems the morning of surgery [phone]   Remember: ONLY 1 PERSON MAY GO WITH YOU TO SHORT STAY TO GET  READY MORNING OF YOUR SURGERY.  Do not eat food or drink liquids :After Midnight.     Take these medicines the morning of surgery with A SIP OF WATER: omeprazole(prilosec), nasal spray as needed                                You may not have any metal on your body including hair pins and              piercings  Do not wear jewelry, make-up, lotions, powders or perfumes, deodorant             Do not wear nail polish.  Do not shave  48 hours prior to surgery.     Do not bring valuables to the hospital. [place].  Contacts, dentures or bridgework may not be worn into surgery.  Leave suitcase in the car. After surgery it may be brought to your room.               Please read over the following fact sheets you were given: _____________________________________________________________________        [facility] - Preparing for Surgery Before surgery, you can play an important role.  Because skin is not sterile, your skin needs to be as free of germs as possible.  You can reduce the number of germs on your skin by washing with CHG (chlorahexidine gluconate) soap before surgery.  CHG is an antiseptic cleaner which kills germs and bonds with the skin to continue killing germs even after washing. Please DO NOT use if you have an allergy to CHG or antibacterial soaps.  If your skin becomes reddened/irritated stop using the CHG and inform your nurse when you arrive at Short Stay. Do not shave (including legs and underarms) for at least 48 hours prior to the first CHG shower.  You may shave your face/neck. Please follow these instructions  carefully:  1.  Shower with CHG Soap the night before surgery and the  morning of Surgery.  2.  If you choose to wash your hair, wash your hair first as usual with your  normal  shampoo.  3.  After you shampoo, rinse your hair and body thoroughly to remove the  shampoo.                           4.  Use CHG as you would any other liquid soap.  You can apply chg directly  to the skin and wash                       Gently with a scrungie or clean washcloth.  5.  Apply the CHG Soap to your body ONLY FROM THE NECK DOWN.   Do not use on face/ open  Wound or open sores. Avoid contact with eyes, ears mouth and genitals (private parts).                       Wash face,  Genitals (private parts) with your normal soap.             6.  Wash thoroughly, paying special attention to the area where your surgery  will be performed.  7.  Thoroughly rinse your body with warm water from the neck down.  8.  DO NOT shower/wash with your normal soap after using and rinsing off  the CHG Soap.                9.  Pat yourself dry with a clean towel.            10.  Wear clean pajamas.            11.  Place clean sheets on your bed the night of your first shower and do not  sleep with pets. Day of Surgery : Do not apply any lotions/deodorants the morning of surgery.  Please wear clean clothes to the hospital/surgery center.  FAILURE TO FOLLOW THESE INSTRUCTIONS MAY RESULT IN THE CANCELLATION OF YOUR SURGERY PATIENT SIGNATURE_________________________________  NURSE SIGNATURE__________________________________  ________________________________________________________________________   Adam Phenix  An incentive spirometer is a tool that can help keep your lungs clear and active. This tool measures how well you are filling your lungs with each breath. Taking long deep breaths may help reverse or decrease the chance of developing breathing (pulmonary) problems (especially infection)  following:  A long period of time when you are unable to move or be active. BEFORE THE PROCEDURE   If the spirometer includes an indicator to show your best effort, your nurse or respiratory therapist will set it to a desired goal.  If possible, sit up straight or lean slightly forward. Try not to slouch.  Hold the incentive spirometer in an upright position. INSTRUCTIONS FOR USE  1. Sit on the edge of your bed if possible, or sit up as far as you can in bed or on a chair. 2. Hold the incentive spirometer in an upright position. 3. Breathe out normally. 4. Place the mouthpiece in your mouth and seal your lips tightly around it. 5. Breathe in slowly and as deeply as possible, raising the piston or the ball toward the top of the column. 6. Hold your breath for 3-5 seconds or for as long as possible. Allow the piston or ball to fall to the bottom of the column. 7. Remove the mouthpiece from your mouth and breathe out normally. 8. Rest for a few seconds and repeat Steps 1 through 7 at least 10 times every 1-2 hours when you are awake. Take your time and take a few normal breaths between deep breaths. 9. The spirometer may include an indicator to show your best effort. Use the indicator as a goal to work toward during each repetition. 10. After each set of 10 deep breaths, practice coughing to be sure your lungs are clear. If you have an incision (the cut made at the time of surgery), support your incision when coughing by placing a pillow or rolled up towels firmly against it. Once you are able to get out of bed, walk around indoors and cough well. You may stop using the incentive spirometer when instructed by your caregiver.  RISKS AND COMPLICATIONS  Take your time so you do not get  dizzy or light-headed.  If you are in pain, you may need to take or ask for pain medication before doing incentive spirometry. It is harder to take a deep breath if you are having pain. AFTER USE  Rest and  breathe slowly and easily.  It can be helpful to keep track of a log of your progress. Your caregiver can provide you with a simple table to help with this. If you are using the spirometer at home, follow these instructions: [place] IF:   You are having difficultly using the spirometer.  You have trouble using the spirometer as often as instructed.  Your pain medication is not giving enough relief while using the spirometer.  You develop fever of 100.5 F (38.1 C) or higher. SEEK IMMEDIATE MEDICAL CARE IF:   You cough up bloody sputum that had not been present before.  You develop fever of 102 F (38.9 C) or greater.  You develop worsening pain at or near the incision site. MAKE SURE YOU:   Understand these instructions.  Will watch your condition.  Will get help right away if you are not doing well or get worse. Document Released: 10/20/2006 Document Revised: 09/01/2011 Document Reviewed: 12/21/2006 Exit[employer] Patient Information 2014 [employer], [place].   ________________________________________________________________________  WHAT IS A BLOOD TRANSFUSION? Blood Transfusion Information  A transfusion is the replacement of blood or some of its parts. Blood is made up of multiple cells which provide different functions.  Red blood cells carry oxygen and are used for blood loss replacement.  White blood cells fight against infection.  Platelets control bleeding.  Plasma helps clot blood.  Other blood products are available for specialized needs, such as hemophilia or other clotting disorders. BEFORE THE TRANSFUSION  Who gives blood for transfusions?   Healthy volunteers who are fully evaluated to make sure their blood is safe. This is blood bank blood. Transfusion therapy is the safest it has ever been in the practice of medicine. Before blood is taken from a donor, a complete history is taken to make sure that person has no history of diseases nor engages in  risky social behavior (examples are intravenous drug use or sexual activity with multiple partners). The donor's travel history is screened to minimize risk of transmitting infections, such as malaria. The donated blood is tested for signs of infectious diseases, such as HIV and hepatitis. The blood is then tested to be sure it is compatible with you in order to minimize the chance of a transfusion reaction. If you or a relative donates blood, this is often done in anticipation of surgery and is not appropriate for emergency situations. It takes many days to process the donated blood. RISKS AND COMPLICATIONS Although transfusion therapy is very safe and saves many lives, the main dangers of transfusion include:   Getting an infectious disease.  Developing a transfusion reaction. This is an allergic reaction to something in the blood you were given. Every precaution is taken to prevent this. The decision to have a blood transfusion has been considered carefully by your caregiver before blood is given. Blood is not given unless the benefits outweigh the risks. AFTER THE TRANSFUSION  Right after receiving a blood transfusion, you will usually feel much better and more energetic. This is especially true if your red blood cells have gotten low (anemic). The transfusion raises the level of the red blood cells which carry oxygen, and this usually causes an energy increase.  The nurse administering the transfusion will  monitor you carefully for complications. HOME CARE INSTRUCTIONS  No special instructions are needed after a transfusion. You may find your energy is better. Speak with your caregiver about any limitations on activity for underlying diseases you may have. SEEK MEDICAL CARE IF:   Your condition is not improving after your transfusion.  You develop redness or irritation at the intravenous (IV) site. SEEK IMMEDIATE MEDICAL CARE IF:  Any of the following symptoms occur over the next 12  hours:  Shaking chills.  You have a temperature by mouth above 102 F (38.9 C), not controlled by medicine.  Chest, back, or muscle pain.  People around you feel you are not acting correctly or are confused.  Shortness of breath or difficulty breathing.  Dizziness and fainting.  You get a rash or develop hives.  You have a decrease in urine output.  Your urine turns a dark color or changes to pink, red, or brown. Any of the following symptoms occur over the next 10 days:  You have a temperature by mouth above 102 F (38.9 C), not controlled by medicine.  Shortness of breath.  Weakness after normal activity.  The white part of the eye turns yellow (jaundice).  You have a decrease in the amount of urine or are urinating less often.  Your urine turns a dark color or changes to pink, red, or brown. Document Released: 06/06/2000 Document Revised: 09/01/2011 Document Reviewed: 01/24/2008 [facility] Patient Information 2014 [place], [place].  _______________________________________________________________________

## 2017-01-13 NOTE — Progress Notes (Signed)
LOV/cardiology clearance Dr Sofie Rower 12-29-16 epic (care everywhere)

## 2017-01-14 ENCOUNTER — Encounter (INDEPENDENT_AMBULATORY_CARE_PROVIDER_SITE_OTHER): Payer: Self-pay

## 2017-01-14 ENCOUNTER — Encounter (HOSPITAL_COMMUNITY): Payer: Self-pay

## 2017-01-14 ENCOUNTER — Encounter (HOSPITAL_COMMUNITY)
Admission: RE | Admit: 2017-01-14 | Discharge: 2017-01-14 | Disposition: A | Discharge status: Home / Self Care | Payer: 59 | Source: Ambulatory Visit | Attending: Orthopedic Surgery | Admitting: Orthopedic Surgery

## 2017-01-14 DIAGNOSIS — M1712 Unilateral primary osteoarthritis, left knee: Secondary | ICD-10-CM | POA: Diagnosis not present

## 2017-01-14 DIAGNOSIS — Z01812 Encounter for preprocedural laboratory examination: Secondary | ICD-10-CM | POA: Diagnosis not present

## 2017-01-14 HISTORY — DX: Other specified postprocedural states: Z98.890

## 2017-01-14 HISTORY — DX: Nausea with vomiting, unspecified: R11.2

## 2017-01-14 LAB — APTT: aPTT: 30 seconds (ref 24–36)

## 2017-01-14 LAB — PROTIME-INR
INR: 0.95
Prothrombin Time: 12.7 seconds (ref 11.4–15.2)

## 2017-01-14 LAB — ABO/RH: ABO/RH(D): O NEG

## 2017-01-14 LAB — SURGICAL PCR SCREEN
MRSA, PCR: INVALID — AB
STAPHYLOCOCCUS AUREUS: INVALID — AB

## 2017-01-14 NOTE — Progress Notes (Signed)
EKG 12-29-16 on chart   CBCdiff, CMP 12-29-16 on chart

## 2017-01-15 LAB — MRSA CULTURE: Culture: NOT DETECTED

## 2017-01-19 MED FILL — OMEPRAZOLE DR 20 MG CAPSULE: 20 | 90 days supply | Qty: 90 | Fill #0 | Status: TO

## 2017-01-20 NOTE — H&P (Signed)
Unicompartmental KNEE ADMISSION H&P  Patient is being admitted for left medial uncicompartmental knee arthroplasty.  Subjective:  Chief Complaint:left knee pain.  HPI: Joanna Willis, 61 y.o. female, has a history of pain and functional disability in the left knee due to arthritis and has failed non-surgical conservative treatments for greater than 12 weeks to includeNSAID's and/or analgesics, corticosteriod injections, viscosupplementation injections, flexibility and strengthening excercises, use of assistive devices and activity modification.  Onset of symptoms was gradual, starting 4 years ago with gradually worsening course since that time. The patient noted prior procedures on the knee to include  arthroscopy and menisectomy on the left knee(s).  Patient currently rates pain in the left knee(s) at 7 out of 10 with activity. Patient has night pain, worsening of pain with activity and weight bearing, pain that interferes with activities of daily living, pain with passive range of motion, crepitus and joint swelling.  Patient has evidence of periarticular osteophytes and joint space narrowing by imaging studies. There is no active infection.  Patient Active Problem List   Diagnosis Date Noted  . Left knee pain 04/19/2014  . BRONCHITIS, ACUTE 07/21/2010   Past Medical History:  Diagnosis Date  . Depression   . GERD (gastroesophageal reflux disease)   . Hypercholesteremia   . PONV (postoperative nausea and vomiting)   . Postmenopausal   . Wears glasses     Past Surgical History:  Procedure Laterality Date  . ABDOMINAL HYSTERECTOMY  2005   did not remove ovaries  . CESAREAN SECTION  2000  . COLONOSCOPY    . FOOT ARTHRODESIS  1995   right  . KNEE ARTHROSCOPY WITH MEDIAL MENISECTOMY Left 05/17/2014   Procedure: LEFT ARTHROSCOPY KNEE WITH PARTIAL MEDIAL MENISECTOMY, CHONDROPLASTY OF LATERAL FEMORAL CONDYLE;  Surgeon: Alta Corning, MD;  Location: [facility];  Service:  Orthopedics;  Laterality: Left;      Current Outpatient Prescriptions:  .  atorvastatin (LIPITOR) 10 MG tablet, Take 10 mg by mouth at bedtime. , Disp: , Rfl:  .  citalopram (CELEXA) 20 MG tablet, Take 20 mg by mouth at bedtime. , Disp: , Rfl:  .  estradiol (VIVELLE-DOT) 0.05 MG/24HR patch, Place 1 patch onto the skin 2 (two) times a week., Disp: , Rfl:  .  fluticasone (FLONASE) 50 MCG/ACT nasal spray, USE 1 SPRAY IN EACH NOSTRIL 2 TIMES A DAY, Disp: 16 g,  .  meloxicam (MOBIC) 15 MG tablet, Take 15 mg by mouth at bedtime. , Disp: , Rfl:  .  omeprazole (PRILOSEC) 20 MG capsule, Take 20 mg by mouth daily., Disp: , Rfl:  .  benzonatate (TESSALON) 200 MG capsule, Take one cap by mouth at bedtime as needed for cough.  May repeat in 4 to 6 hours (Patient not taking: Reported on 01/12/2017), Disp: 15 capsule, Rfl: 0 .  doxycycline (VIBRAMYCIN) 100 MG capsule, Take 1 capsule (100 mg total) by mouth 2 (two) times daily. Take with food (Rx void after 11/28/16) (Patient not taking: Reported on 01/12/2017), Disp: 14 capsule, Rfl: 0  Allergies  Allergen Reactions  . Penicillins Rash    As a child. Has patient had a PCN reaction causing immediate rash, facial/tongue/throat swelling, SOB or lightheadedness with hypotension: Unknown Has patient had a PCN reaction causing severe rash involving mucus membranes or skin necrosis: Unknown Has patient had a PCN reaction that required hospitalization: Unknown Has patient had a PCN reaction occurring within the last 10 years: Unknown If all of the above answers are "  NO", then may proceed with Cephalosporin use.     Social History  Substance Use Topics  . Smoking status: Never Smoker  . Smokeless tobacco: Never Used  . Alcohol use 3.0 oz/week    5 Cans of beer per week    Family History  Problem Relation Age of Onset  . Colon cancer Mother 89       lived 6 months after diagnosis  . Pancreatic cancer Neg Hx   . Stomach cancer Neg Hx      Review of Systems   Constitutional: Negative.   HENT: Negative.   Eyes: Negative.   Respiratory: Negative.   Cardiovascular: Negative.   Gastrointestinal: Negative.   Genitourinary: Negative.   Musculoskeletal: Positive for joint pain and myalgias. Negative for back pain, falls and neck pain.  Skin: Negative.   Neurological: Negative.   Endo/Heme/Allergies: Negative.   Psychiatric/Behavioral: Positive for depression. Negative for hallucinations, memory loss, substance abuse and suicidal ideas. The patient is not nervous/anxious and does not have insomnia.     Objective:  Physical Exam  Constitutional: She is oriented to person, place, and time. She appears well-developed and well-nourished. No distress.  HENT:  Head: Normocephalic and atraumatic.  Right Ear: External ear normal.  Left Ear: External ear normal.  Nose: Nose normal.  Mouth/Throat: Oropharynx is clear and moist.  Eyes: Conjunctivae and EOM are normal.  Neck: Normal range of motion. Neck supple.  Cardiovascular: Normal rate, regular rhythm, normal heart sounds and intact distal pulses.   No murmur heard. Respiratory: Effort normal and breath sounds normal. No respiratory distress. She has no wheezes.  GI: Soft. Bowel sounds are normal. She exhibits no distension. There is no tenderness.  Musculoskeletal: She exhibits no edema.       Right hip: Normal.       Left hip: Normal.       Right knee: Normal.  Her hips, normal range of motion. No discomfort. Right knee range 0 to 135. No swelling, tenderness, or instability. Left knee, no deformity, no effusion. Range 0 to 130, tenderness medially. There is no lateral tenderness or instability noted. Gait pattern is antalgic on the left.  Neurological: She is alert and oriented to person, place, and time. She has normal strength. No sensory deficit.  Skin: No rash noted. She is not diaphoretic. No erythema.  Psychiatric: She has a normal mood and affect. Her behavior is normal.    Vitals   Weight: 155 lb Height: 66in Body Surface Area: 1.79 m Body Mass Index: 25.02 kg/m  Pulse: 72 (Regular)  BP: 118/70 (Sitting, Left Arm, Standard)  Imaging Review Plain radiographs demonstrate severe degenerative joint disease of the left knee(s). The overall alignment ismild varus. The bone quality appears to be good for age and reported activity level.  Assessment/Plan:  End stage primary osteoarthritis, left knee , medial compartment  The patient history, physical examination, clinical judgment of the provider and imaging studies are consistent with end stage degenerative joint disease of the left knee(s) and unicompartmental knee arthroplasty is deemed medically necessary. The treatment options including medical management, injection therapy arthroscopy and arthroplasty were discussed at length. The risks and benefits of unicompartmental knee arthroplasty were presented and reviewed. The risks due to aseptic loosening, infection, stiffness, patella tracking problems, thromboembolic complications and other imponderables were discussed. The patient acknowledged the explanation, agreed to proceed with the plan and consent was signed. Patient is being admitted for inpatient treatment for surgery, pain control, PT, OT, prophylactic antibiotics,  VTE prophylaxis, progressive ambulation and ADL's and discharge planning. The patient is planning to be discharged direct outpatient therapy    PCP: Dr. Doug Sou ([facility]) Home with husband Therapy Plans: outpatient therapy at [facility] DME: has walker and cane Other concerns: no anethesia concerns   Ardeen Jourdain, PA-C

## 2017-01-21 ENCOUNTER — Encounter (HOSPITAL_COMMUNITY): Payer: Self-pay | Admitting: *Deleted

## 2017-01-21 ENCOUNTER — Ambulatory Visit (HOSPITAL_COMMUNITY): Payer: 59 | Admitting: Anesthesiology

## 2017-01-21 ENCOUNTER — Encounter (HOSPITAL_COMMUNITY)
Admission: RE | Disposition: A | Discharge status: Home / Self Care | Payer: Self-pay | Source: Ambulatory Visit | Attending: Orthopedic Surgery

## 2017-01-21 ENCOUNTER — Observation Stay (HOSPITAL_COMMUNITY)
Admission: RE | Admit: 2017-01-21 | Discharge: 2017-01-22 | Disposition: A | Discharge status: Home / Self Care | Payer: 59 | Source: Ambulatory Visit | Attending: Orthopedic Surgery | Admitting: Orthopedic Surgery

## 2017-01-21 DIAGNOSIS — Z79899 Other long term (current) drug therapy: Secondary | ICD-10-CM | POA: Diagnosis not present

## 2017-01-21 DIAGNOSIS — T8482XD Fibrosis due to internal orthopedic prosthetic devices, implants and grafts, subsequent encounter: Secondary | ICD-10-CM | POA: Diagnosis present

## 2017-01-21 DIAGNOSIS — M1712 Unilateral primary osteoarthritis, left knee: Secondary | ICD-10-CM | POA: Diagnosis not present

## 2017-01-21 DIAGNOSIS — J209 Acute bronchitis, unspecified: Secondary | ICD-10-CM | POA: Diagnosis not present

## 2017-01-21 DIAGNOSIS — M25562 Pain in left knee: Secondary | ICD-10-CM | POA: Diagnosis not present

## 2017-01-21 DIAGNOSIS — M1710 Unilateral primary osteoarthritis, unspecified knee: Secondary | ICD-10-CM

## 2017-01-21 DIAGNOSIS — Z880 Allergy status to penicillin: Secondary | ICD-10-CM | POA: Insufficient documentation

## 2017-01-21 DIAGNOSIS — Z96652 Presence of left artificial knee joint: Secondary | ICD-10-CM | POA: Diagnosis not present

## 2017-01-21 DIAGNOSIS — E7800 Pure hypercholesterolemia, unspecified: Secondary | ICD-10-CM | POA: Insufficient documentation

## 2017-01-21 DIAGNOSIS — G8918 Other acute postprocedural pain: Secondary | ICD-10-CM | POA: Diagnosis not present

## 2017-01-21 DIAGNOSIS — K219 Gastro-esophageal reflux disease without esophagitis: Secondary | ICD-10-CM | POA: Diagnosis not present

## 2017-01-21 DIAGNOSIS — Z7982 Long term (current) use of aspirin: Secondary | ICD-10-CM | POA: Diagnosis not present

## 2017-01-21 DIAGNOSIS — F329 Major depressive disorder, single episode, unspecified: Secondary | ICD-10-CM | POA: Diagnosis not present

## 2017-01-21 DIAGNOSIS — M179 Osteoarthritis of knee, unspecified: Secondary | ICD-10-CM

## 2017-01-21 HISTORY — PX: PARTIAL KNEE ARTHROPLASTY: SHX2174

## 2017-01-21 LAB — TYPE AND SCREEN
ABO/RH(D): O NEG
Antibody Screen: NEGATIVE

## 2017-01-21 SURGERY — ARTHROPLASTY, KNEE, UNICOMPARTMENTAL
Anesthesia: Spinal | Site: Knee | Laterality: Left

## 2017-01-21 MED ORDER — HYDROMORPHONE HCL-NACL 0.5-0.9 MG/ML-% IV SOSY: 0.2500 mg | PREFILLED_SYRINGE | INTRAVENOUS | Status: DC | PRN

## 2017-01-21 MED ORDER — DEXAMETHASONE SODIUM PHOSPHATE 10 MG/ML IJ SOLN
10.0000 mg | Freq: Once | INTRAMUSCULAR | Status: AC
  Administered 2017-01-22: 10 mg via INTRAVENOUS
  Filled 2017-01-21: qty 1

## 2017-01-21 MED ORDER — POLYETHYLENE GLYCOL 3350 17 G PO PACK: 17.0000 g | PACK | Freq: Every day | ORAL | Status: DC | PRN

## 2017-01-21 MED ORDER — METOCLOPRAMIDE HCL 5 MG/ML IJ SOLN: 5.0000 mg | Freq: Three times a day (TID) | INTRAMUSCULAR | Status: DC | PRN

## 2017-01-21 MED ORDER — CEFAZOLIN SODIUM-DEXTROSE 2-4 GM/100ML-% IV SOLN
INTRAVENOUS | Status: AC
  Filled 2017-01-21: qty 100

## 2017-01-21 MED ORDER — MENTHOL 3 MG MT LOZG: 1.0000 | LOZENGE | OROMUCOSAL | Status: DC | PRN

## 2017-01-21 MED ORDER — SODIUM CHLORIDE 0.9 % IV SOLN
1000.0000 mg | Freq: Once | INTRAVENOUS | Status: AC
  Administered 2017-01-21: 15:00:00 1000 mg via INTRAVENOUS
  Filled 2017-01-21: qty 1100

## 2017-01-21 MED ORDER — ONDANSETRON HCL 4 MG/2ML IJ SOLN
4.0000 mg | Freq: Four times a day (QID) | INTRAMUSCULAR | Status: DC | PRN
  Administered 2017-01-21: 16:00:00 4 mg via INTRAVENOUS
  Filled 2017-01-21: qty 2

## 2017-01-21 MED ORDER — PANTOPRAZOLE SODIUM 40 MG PO TBEC
40.0000 mg | DELAYED_RELEASE_TABLET | Freq: Every day | ORAL | Status: DC
  Administered 2017-01-22: 08:00:00 40 mg via ORAL
  Filled 2017-01-21: qty 1

## 2017-01-21 MED ORDER — DIPHENHYDRAMINE HCL 12.5 MG/5ML PO ELIX: 12.5000 mg | ORAL_SOLUTION | ORAL | Status: DC | PRN

## 2017-01-21 MED ORDER — MIDAZOLAM HCL 2 MG/2ML IJ SOLN
2.0000 mg | Freq: Once | INTRAMUSCULAR | Status: AC
  Administered 2017-01-21: 1 mg via INTRAVENOUS

## 2017-01-21 MED ORDER — PROMETHAZINE HCL 25 MG/ML IJ SOLN
6.2500 mg | INTRAMUSCULAR | Status: DC | PRN
Start: 2017-01-21 — End: 2017-01-21

## 2017-01-21 MED ORDER — PROPOFOL 10 MG/ML IV BOLUS
INTRAVENOUS | Status: DC | PRN
  Administered 2017-01-21: 10 mg via INTRAVENOUS
  Administered 2017-01-21 (×2): 20 mg via INTRAVENOUS
  Administered 2017-01-21: 10 mg via INTRAVENOUS

## 2017-01-21 MED ORDER — ROPIVACAINE HCL 5 MG/ML IJ SOLN
INTRAMUSCULAR | Status: DC | PRN
  Administered 2017-01-21: 30 mL via PERINEURAL

## 2017-01-21 MED ORDER — MORPHINE SULFATE (PF) 4 MG/ML IV SOLN
1.0000 mg | INTRAVENOUS | Status: DC | PRN
Start: 2017-01-21 — End: 2017-01-22

## 2017-01-21 MED ORDER — EPHEDRINE SULFATE 50 MG/ML IJ SOLN
INTRAMUSCULAR | Status: DC | PRN
Start: 2017-01-21 — End: 2017-01-21
  Administered 2017-01-21: 10 mg via INTRAVENOUS

## 2017-01-21 MED ORDER — ACETAMINOPHEN 325 MG PO TABS: 650.0000 mg | ORAL_TABLET | Freq: Four times a day (QID) | ORAL | Status: DC | PRN

## 2017-01-21 MED ORDER — SODIUM CHLORIDE 0.9 % IV SOLN
INTRAVENOUS | Status: DC
  Administered 2017-01-21: 15:00:00 via INTRAVENOUS

## 2017-01-21 MED ORDER — DEXAMETHASONE SODIUM PHOSPHATE 10 MG/ML IJ SOLN
10.0000 mg | Freq: Once | INTRAMUSCULAR | Status: AC
  Administered 2017-01-21: 10 mg via INTRAVENOUS

## 2017-01-21 MED ORDER — CHLORHEXIDINE GLUCONATE 4 % EX LIQD: 60.0000 mL | Freq: Once | CUTANEOUS | Status: DC

## 2017-01-21 MED ORDER — ONDANSETRON HCL 4 MG/2ML IJ SOLN
INTRAMUSCULAR | Status: DC | PRN
  Administered 2017-01-21: 4 mg via INTRAVENOUS

## 2017-01-21 MED ORDER — RIVAROXABAN 10 MG PO TABS
10.0000 mg | ORAL_TABLET | Freq: Every day | ORAL | Status: DC
  Administered 2017-01-22: 08:00:00 10 mg via ORAL
  Filled 2017-01-21: qty 1

## 2017-01-21 MED ORDER — CEFAZOLIN SODIUM-DEXTROSE 2-4 GM/100ML-% IV SOLN
2.0000 g | Freq: Four times a day (QID) | INTRAVENOUS | Status: AC
  Administered 2017-01-21 (×2): 2 g via INTRAVENOUS
  Filled 2017-01-21 (×2): qty 100

## 2017-01-21 MED ORDER — PROPOFOL 10 MG/ML IV BOLUS
INTRAVENOUS | Status: AC
  Filled 2017-01-21: qty 40

## 2017-01-21 MED ORDER — FLEET ENEMA 7-19 GM/118ML RE ENEM: 1.0000 | ENEMA | Freq: Once | RECTAL | Status: DC | PRN

## 2017-01-21 MED ORDER — SODIUM CHLORIDE 0.9 % IR SOLN
Status: DC | PRN
  Administered 2017-01-21: 1000 mL

## 2017-01-21 MED ORDER — LACTATED RINGERS IV SOLN
INTRAVENOUS | Status: DC
  Administered 2017-01-21 (×2): via INTRAVENOUS

## 2017-01-21 MED ORDER — FENTANYL CITRATE (PF) 100 MCG/2ML IJ SOLN
100.0000 ug | Freq: Once | INTRAMUSCULAR | Status: AC
  Administered 2017-01-21: 50 ug via INTRAVENOUS

## 2017-01-21 MED ORDER — CITALOPRAM HYDROBROMIDE 20 MG PO TABS
20.0000 mg | ORAL_TABLET | Freq: Every day | ORAL | Status: DC
  Administered 2017-01-21: 22:00:00 20 mg via ORAL
  Filled 2017-01-21: qty 1

## 2017-01-21 MED ORDER — TRANEXAMIC ACID 1000 MG/10ML IV SOLN
1000.0000 mg | INTRAVENOUS | Status: AC
  Administered 2017-01-21: 1000 mg via INTRAVENOUS
  Filled 2017-01-21: qty 1100

## 2017-01-21 MED ORDER — ACETAMINOPHEN 650 MG RE SUPP: 650.0000 mg | Freq: Four times a day (QID) | RECTAL | Status: DC | PRN

## 2017-01-21 MED ORDER — METHOCARBAMOL 500 MG PO TABS
500.0000 mg | ORAL_TABLET | Freq: Four times a day (QID) | ORAL | Status: DC | PRN
  Administered 2017-01-21 (×2): 500 mg via ORAL
  Filled 2017-01-21 (×2): qty 1

## 2017-01-21 MED ORDER — DEXAMETHASONE SODIUM PHOSPHATE 10 MG/ML IJ SOLN
INTRAMUSCULAR | Status: AC
  Filled 2017-01-21: qty 1

## 2017-01-21 MED ORDER — SODIUM CHLORIDE 0.9 % IJ SOLN
INTRAMUSCULAR | Status: AC
  Filled 2017-01-21: qty 10

## 2017-01-21 MED ORDER — METHOCARBAMOL 1000 MG/10ML IJ SOLN
500.0000 mg | Freq: Four times a day (QID) | INTRAVENOUS | Status: DC | PRN
  Filled 2017-01-21: qty 5

## 2017-01-21 MED ORDER — PHENOL 1.4 % MT LIQD: 1.0000 | OROMUCOSAL | Status: DC | PRN

## 2017-01-21 MED ORDER — MIDAZOLAM HCL 2 MG/2ML IJ SOLN
INTRAMUSCULAR | Status: AC
  Administered 2017-01-21: 1 mg via INTRAVENOUS
  Filled 2017-01-21: qty 2

## 2017-01-21 MED ORDER — BUPIVACAINE LIPOSOME 1.3 % IJ SUSP
INTRAMUSCULAR | Status: DC | PRN
  Administered 2017-01-21: 20 mL

## 2017-01-21 MED ORDER — BUPIVACAINE LIPOSOME 1.3 % IJ SUSP
20.0000 mL | Freq: Once | INTRAMUSCULAR | Status: DC
  Filled 2017-01-21: qty 20

## 2017-01-21 MED ORDER — LIDOCAINE 2% (20 MG/ML) 5 ML SYRINGE
INTRAMUSCULAR | Status: AC
  Filled 2017-01-21: qty 5

## 2017-01-21 MED ORDER — PROPOFOL 10 MG/ML IV BOLUS
INTRAVENOUS | Status: AC
  Filled 2017-01-21: qty 20

## 2017-01-21 MED ORDER — ONDANSETRON HCL 4 MG PO TABS: 4.0000 mg | ORAL_TABLET | Freq: Four times a day (QID) | ORAL | Status: DC | PRN

## 2017-01-21 MED ORDER — ATORVASTATIN CALCIUM 10 MG PO TABS
10.0000 mg | ORAL_TABLET | Freq: Every day | ORAL | Status: DC
  Administered 2017-01-21: 10 mg via ORAL
  Filled 2017-01-21: qty 1

## 2017-01-21 MED ORDER — CEFAZOLIN SODIUM-DEXTROSE 2-4 GM/100ML-% IV SOLN
2.0000 g | INTRAVENOUS | Status: AC
  Administered 2017-01-21: 2 g via INTRAVENOUS

## 2017-01-21 MED ORDER — PROPOFOL 500 MG/50ML IV EMUL
INTRAVENOUS | Status: DC | PRN
  Administered 2017-01-21: 50 ug/kg/min via INTRAVENOUS

## 2017-01-21 MED ORDER — ACETAMINOPHEN 10 MG/ML IV SOLN
INTRAVENOUS | Status: AC
  Filled 2017-01-21: qty 100

## 2017-01-21 MED ORDER — ACETAMINOPHEN 500 MG PO TABS
1000.0000 mg | ORAL_TABLET | Freq: Four times a day (QID) | ORAL | Status: AC
  Administered 2017-01-21 – 2017-01-22 (×4): 1000 mg via ORAL
  Filled 2017-01-21 (×4): qty 2

## 2017-01-21 MED ORDER — EPHEDRINE 5 MG/ML INJ
INTRAVENOUS | Status: AC
  Filled 2017-01-21: qty 10

## 2017-01-21 MED ORDER — BUPIVACAINE IN DEXTROSE 0.75-8.25 % IT SOLN
INTRATHECAL | Status: DC | PRN
  Administered 2017-01-21: 1.8 mL via INTRATHECAL

## 2017-01-21 MED ORDER — DOCUSATE SODIUM 100 MG PO CAPS
100.0000 mg | ORAL_CAPSULE | Freq: Two times a day (BID) | ORAL | Status: DC
  Administered 2017-01-21 – 2017-01-22 (×2): 100 mg via ORAL
  Filled 2017-01-21 (×2): qty 1

## 2017-01-21 MED ORDER — ACETAMINOPHEN 10 MG/ML IV SOLN
1000.0000 mg | Freq: Once | INTRAVENOUS | Status: AC
  Administered 2017-01-21: 1000 mg via INTRAVENOUS

## 2017-01-21 MED ORDER — FLUTICASONE PROPIONATE 50 MCG/ACT NA SUSP
1.0000 | Freq: Two times a day (BID) | NASAL | Status: DC
  Filled 2017-01-21: qty 16

## 2017-01-21 MED ORDER — SODIUM CHLORIDE 0.9 % IJ SOLN
INTRAMUSCULAR | Status: DC | PRN
Start: 2017-01-21 — End: 2017-01-21
  Administered 2017-01-21: 50 mL

## 2017-01-21 MED ORDER — METOCLOPRAMIDE HCL 5 MG PO TABS: 5.0000 mg | ORAL_TABLET | Freq: Three times a day (TID) | ORAL | Status: DC | PRN

## 2017-01-21 MED ORDER — SODIUM CHLORIDE 0.9 % IJ SOLN
INTRAMUSCULAR | Status: AC
  Filled 2017-01-21: qty 50

## 2017-01-21 MED ORDER — ONDANSETRON HCL 4 MG/2ML IJ SOLN
INTRAMUSCULAR | Status: AC
  Filled 2017-01-21: qty 2

## 2017-01-21 MED ORDER — BISACODYL 10 MG RE SUPP: 10.0000 mg | Freq: Every day | RECTAL | Status: DC | PRN

## 2017-01-21 MED ORDER — FENTANYL CITRATE (PF) 100 MCG/2ML IJ SOLN
INTRAMUSCULAR | Status: AC
  Administered 2017-01-21: 50 ug via INTRAVENOUS
  Filled 2017-01-21: qty 2

## 2017-01-21 MED ORDER — OXYCODONE HCL 5 MG PO TABS
5.0000 mg | ORAL_TABLET | ORAL | Status: DC | PRN
  Administered 2017-01-21 – 2017-01-22 (×5): 5 mg via ORAL
  Administered 2017-01-22: 06:00:00 10 mg via ORAL
  Administered 2017-01-22: 5 mg via ORAL
  Filled 2017-01-21 (×4): qty 1
  Filled 2017-01-21: qty 2
  Filled 2017-01-21 (×2): qty 1

## 2017-01-21 SURGICAL SUPPLY — 49 items
BAG DECANTER FOR FLEXI CONT (MISCELLANEOUS) ×3 IMPLANT
BAG SPEC THK2 15X12 ZIP CLS (MISCELLANEOUS)
BAG ZIPLOCK 12X15 (MISCELLANEOUS) IMPLANT
BANDAGE ACE 6X5 VEL STRL LF (GAUZE/BANDAGES/DRESSINGS) ×3 IMPLANT
BLADE SAW RECIPROCATING 77.5 (BLADE) ×3 IMPLANT
BLADE SAW SGTL 13.0X1.19X90.0M (BLADE) ×3 IMPLANT
BOWL SMART MIX CTS (DISPOSABLE) ×3 IMPLANT
BUR OVAL CARBIDE 4.0 (BURR) ×3 IMPLANT
CAPT KNEE PARTIAL 2 ×3 IMPLANT
CEMENT HV SMART SET (Cement) ×3 IMPLANT
CLOSURE WOUND 1/2 X4 (GAUZE/BANDAGES/DRESSINGS) ×1
CLOTH BEACON ORANGE TIMEOUT ST (SAFETY) ×3 IMPLANT
COVER SURGICAL LIGHT HANDLE (MISCELLANEOUS) ×3 IMPLANT
CUFF TOURN SGL QUICK 34 (TOURNIQUET CUFF) ×3
CUFF TRNQT CYL 34X4X40X1 (TOURNIQUET CUFF) ×1 IMPLANT
DRSG ADAPTIC 3X8 NADH LF (GAUZE/BANDAGES/DRESSINGS) ×3 IMPLANT
DRSG PAD ABDOMINAL 8X10 ST (GAUZE/BANDAGES/DRESSINGS) ×3 IMPLANT
DURAPREP 26ML APPLICATOR (WOUND CARE) ×3 IMPLANT
ELECT REM PT RETURN 15FT ADLT (MISCELLANEOUS) ×3 IMPLANT
EVACUATOR 1/8 PVC DRAIN (DRAIN) ×3 IMPLANT
GAUZE SPONGE 4X4 12PLY STRL (GAUZE/BANDAGES/DRESSINGS) ×3 IMPLANT
GLOVE BIO SURGEON STRL SZ7.5 (GLOVE) ×6 IMPLANT
GLOVE BIO SURGEON STRL SZ8 (GLOVE) ×3 IMPLANT
GLOVE BIOGEL PI IND STRL 6.5 (GLOVE) IMPLANT
GLOVE BIOGEL PI IND STRL 8 (GLOVE) ×2 IMPLANT
GLOVE BIOGEL PI INDICATOR 6.5 (GLOVE) ×8
GLOVE BIOGEL PI INDICATOR 8 (GLOVE) ×6
GOWN STRL REUS W/TWL LRG LVL3 (GOWN DISPOSABLE) ×6 IMPLANT
GOWN STRL REUS W/TWL XL LVL3 (GOWN DISPOSABLE) ×9 IMPLANT
HANDPIECE INTERPULSE COAX TIP (DISPOSABLE) ×3
IMMOBILIZER KNEE 20 (SOFTGOODS) ×3
IMMOBILIZER KNEE 20 THIGH 36 (SOFTGOODS) ×1 IMPLANT
KIT IMPL STRL TIB IPOLY IUNI IMPLANT
MANIFOLD NEPTUNE II (INSTRUMENTS) ×3 IMPLANT
NDL HYPO 21X1.5 SAFETY (NEEDLE) IMPLANT
NEEDLE HYPO 21X1.5 SAFETY (NEEDLE) ×6 IMPLANT
PACK TOTAL KNEE CUSTOM (KITS) ×3 IMPLANT
PADDING CAST COTTON 6X4 STRL (CAST SUPPLIES) ×3 IMPLANT
POSITIONER SURGICAL ARM (MISCELLANEOUS) ×3 IMPLANT
SET HNDPC FAN SPRY TIP SCT (DISPOSABLE) ×1 IMPLANT
STRIP CLOSURE SKIN 1/2X4 (GAUZE/BANDAGES/DRESSINGS) ×2 IMPLANT
SUT MNCRL AB 4-0 PS2 18 (SUTURE) ×3 IMPLANT
SUT STRATAFIX 0 PDS 27 VIOLET (SUTURE) ×3
SUT VIC AB 2-0 CT1 27 (SUTURE) ×6
SUT VIC AB 2-0 CT1 TAPERPNT 27 (SUTURE) ×2 IMPLANT
SUTURE STRATFX 0 PDS 27 VIOLET (SUTURE) ×1 IMPLANT
SYR 50ML LL SCALE MARK (SYRINGE) ×3 IMPLANT
SYRINGE 35CC LL (MISCELLANEOUS) ×6 IMPLANT
WRAP KNEE MAXI GEL POST OP (GAUZE/BANDAGES/DRESSINGS) ×2 IMPLANT

## 2017-01-21 NOTE — Anesthesia Preprocedure Evaluation (Signed)
Anesthesia Evaluation  Patient identified by MRN, date of birth, ID band Patient awake    Reviewed: Allergy & Precautions, NPO status , Patient's Chart, lab work & pertinent test results  History of Anesthesia Complications (+) PONV  Airway Mallampati: II  TM Distance: >3 FB Neck ROM: Full    Dental no notable dental hx.    Pulmonary neg pulmonary ROS,    Pulmonary exam normal breath sounds clear to auscultation       Cardiovascular negative cardio ROS Normal cardiovascular exam Rhythm:Regular Rate:Normal     Neuro/Psych negative neurological ROS  negative psych ROS   GI/Hepatic negative GI ROS, Neg liver ROS,   Endo/Other  negative endocrine ROS  Renal/GU negative Renal ROS  negative genitourinary   Musculoskeletal negative musculoskeletal ROS (+)   Abdominal   Peds negative pediatric ROS (+)  Hematology negative hematology ROS (+)   Anesthesia Other Findings   Reproductive/Obstetrics negative OB ROS                             Anesthesia Physical Anesthesia Plan  ASA: II  Anesthesia Plan: Spinal   Post-op Pain Management:  Regional for Post-op pain   Induction: Intravenous  PONV Risk Score and Plan: Dexamethasone and Propofol infusion  Airway Management Planned: Simple Face Mask  Additional Equipment:   Intra-op Plan:   Post-operative Plan:   Informed Consent: I have reviewed the patients History and Physical, chart, labs and discussed the procedure including the risks, benefits and alternatives for the proposed anesthesia with the patient or authorized representative who has indicated his/her understanding and acceptance.   Dental advisory given  Plan Discussed with: CRNA and Surgeon  Anesthesia Plan Comments:         Anesthesia Quick Evaluation

## 2017-01-21 NOTE — Anesthesia Procedure Notes (Signed)
Procedure Name: MAC Date/Time: 01/21/2017 10:04 AM Performed by: Dione Booze Pre-anesthesia Checklist: Patient identified, Emergency Drugs available, Suction available and Patient being monitored Patient Re-evaluated:Patient Re-evaluated prior to induction Oxygen Delivery Method: Simple face mask Placement Confirmation: positive ETCO2

## 2017-01-21 NOTE — Anesthesia Procedure Notes (Signed)
Anesthesia Regional Block: Adductor canal block   Pre-Anesthetic Checklist: ,, timeout performed, Correct Patient, Correct Site, Correct Laterality, Correct Procedure, Correct Position, site marked, Risks and benefits discussed,  Surgical consent,  Pre-op evaluation,  At surgeon's request and post-op pain management  Laterality: Left  Prep: chloraprep       Needles:  Injection technique: Single-shot  Needle Type: Echogenic Needle     Needle Length: 9cm      Additional Needles:   Procedures: ultrasound guided,,,,,,,,  Narrative:  Start time: 01/21/2017 9:28 AM End time: 01/21/2017 9:35 AM Injection made incrementally with aspirations every 5 mL.  Performed by: Personally  Anesthesiologist: Mauriah Mcmillen  Additional Notes: Patient tolerated the procedure well without complications

## 2017-01-21 NOTE — Interval H&P Note (Signed)
History and Physical Interval Note:  01/21/2017 8:21 AM  Joanna Willis  has presented today for surgery, with the diagnosis of Medical compartment osteoarthritis Left Knee  The various methods of treatment have been discussed with the patient and family. After consideration of risks, benefits and other options for treatment, the patient has consented to  Procedure(s): UNICOMPARTMENTAL LEFT KNEE (Left) as a surgical intervention .  The patient's history has been reviewed, patient examined, no change in status, stable for surgery.  I have reviewed the patient's chart and labs.  Questions were answered to the patient's satisfaction.     [name]

## 2017-01-21 NOTE — Anesthesia Procedure Notes (Signed)
Spinal  Patient location during procedure: OR Start time: 01/21/2017 9:58 AM End time: 01/21/2017 10:06 AM Staffing Anesthesiologist: Golda Zavalza, Iona Beard Performed: anesthesiologist  Preanesthetic Checklist Completed: patient identified, site marked, surgical consent, pre-op evaluation, timeout performed, IV checked, risks and benefits discussed and monitors and equipment checked Spinal Block Patient position: sitting Prep: Betadine Patient monitoring: heart rate, continuous pulse ox and blood pressure Location: L3-4 Injection technique: single-shot Needle Needle type: Sprotte  Needle gauge: 24 G Needle length: 9 cm Additional Notes Expiration date of kit checked and confirmed. Patient tolerated procedure well, without complications.

## 2017-01-21 NOTE — Anesthesia Procedure Notes (Addendum)
Anesthesia Procedure Image    

## 2017-01-21 NOTE — Discharge Instructions (Addendum)
° °Dr. Frank Aluisio °Total Joint Specialist °[facility] °[address] °[place], [place] [address] °[phone] ° °UNI KNEE REPLACEMENT POSTOPERATIVE DIRECTIONS ° ° °Knee Rehabilitation, Guidelines Following Surgery  °Results after knee surgery are often greatly improved when you follow the exercise, range of motion and muscle strengthening exercises prescribed by your doctor. Safety measures are also important to protect the knee from further injury. Any time any of these exercises cause you to have increased pain or swelling in your knee joint, decrease the amount until you are comfortable again and slowly increase them. If you have problems or questions, call your caregiver or physical therapist for advice.  ° °HOME CARE INSTRUCTIONS  °Remove items at home which could result in a fall. This includes throw rugs or furniture in walking pathways.  °· ICE to the affected knee every three hours for 30 minutes at a time and then as needed for pain and swelling.  Continue to use ice on the knee for pain and swelling from surgery. You may notice swelling that will progress down to the foot and ankle.  This is normal after surgery.  Elevate the leg when you are not up walking on it.   °· Continue to use the breathing machine which will help keep your temperature down.  It is common for your temperature to cycle up and down following surgery, especially at night when you are not up moving around and exerting yourself.  The breathing machine keeps your lungs expanded and your temperature down. °· Do not place pillow under knee, focus on keeping the knee straight while resting ° °DIET °You may resume your previous home diet once your are discharged from the hospital. ° °DRESSING / WOUND CARE / SHOWERING °You may shower 3 days after surgery, but keep the wounds dry during showering.  You may use an occlusive plastic wrap (Press'n Seal for example), NO SOAKING/SUBMERGING IN THE BATHTUB.  If the  bandage gets wet, change with a clean dry gauze.  If the incision gets wet, pat the wound dry with a clean towel. °You may start showering once you are discharged home but do not submerge the incision under water. Just pat the incision dry and apply a dry gauze dressing on daily. °Change the surgical dressing daily and reapply a dry dressing each time. ° °ACTIVITY °Walk with your walker as instructed. °Use walker as long as suggested by your caregivers. °Avoid periods of inactivity such as sitting longer than an hour when not asleep. This helps prevent blood clots.  °You may resume a sexual relationship in one month or when given the OK by your doctor.  °You may return to work once you are cleared by your doctor.  °Do not drive a car for 6 weeks or until released by you surgeon.  °Do not drive while taking narcotics. ° °WEIGHT BEARING °Weight bearing as tolerated with assist device (walker, cane, etc) as directed, use it as long as suggested by your surgeon or therapist, typically at least 4-6 weeks. ° °POSTOPERATIVE CONSTIPATION PROTOCOL °Constipation - defined medically as fewer than three stools per week and severe constipation as less than one stool per week. ° °One of the most common issues patients have following surgery is constipation.  Even if you have a regular bowel pattern at home, your normal regimen is likely to be disrupted due to multiple reasons following surgery.  Combination of anesthesia, postoperative narcotics, change in appetite and fluid intake all can affect your bowels.    In order to avoid complications following surgery, here are some recommendations in order to help you during your recovery period. ° °Colace (docusate) - Pick up an over-the-counter form of Colace or another stool softener and take twice a day as long as you are requiring postoperative pain medications.  Take with a full glass of water daily.  If you experience loose stools or diarrhea, hold the colace until you stool forms  back up.  If your symptoms do not get better within 1 week or if they get worse, check with your doctor. ° °Dulcolax (bisacodyl) - Pick up over-the-counter and take as directed by the product packaging as needed to assist with the movement of your bowels.  Take with a full glass of water.  Use this product as needed if not relieved by Colace only.  ° °MiraLax (polyethylene glycol) - Pick up over-the-counter to have on hand.  MiraLax is a solution that will increase the amount of water in your bowels to assist with bowel movements.  Take as directed and can mix with a glass of water, juice, soda, coffee, or tea.  Take if you go more than two days without a movement. °Do not use MiraLax more than once per day. Call your doctor if you are still constipated or irregular after using this medication for 7 days in a row. ° °If you continue to have problems with postoperative constipation, please contact the office for further assistance and recommendations.  If you experience "the worst abdominal pain ever" or develop nausea or vomiting, please contact the office immediatly for further recommendations for treatment. ° °ITCHING ° If you experience itching with your medications, try taking only a single pain pill, or even half a pain pill at a time.  You can also use Benadryl over the counter for itching or also to help with sleep.  ° °TED HOSE STOCKINGS °Wear the elastic stockings on both legs for three weeks following surgery during the day but you may remove then at night for sleeping. ° °MEDICATIONS °See your medication summary on the “After Visit Summary” that the nursing staff will review with you prior to discharge.  You may have some home medications which will be placed on hold until you complete the course of blood thinner medication.  It is important for you to complete the blood thinner medication as prescribed by your surgeon.  Continue your approved medications as instructed at time of  discharge. ° °PRECAUTIONS °If you experience chest pain or shortness of breath - call 911 immediately for transfer to the hospital emergency department.  °If you develop a fever greater that 101 F, purulent drainage from wound, increased redness or drainage from wound, foul odor from the wound/dressing, or calf pain - CONTACT YOUR SURGEON.   °                                                °FOLLOW-UP APPOINTMENTS °Make sure you keep all of your appointments after your operation with your surgeon and caregivers. You should call the office at the above phone number and make an appointment for approximately two weeks after the date of your surgery or on the date instructed by your surgeon outlined in the "After Visit Summary". ° °RANGE OF MOTION AND STRENGTHENING EXERCISES  °Rehabilitation of the knee is important following a knee injury or an   operation. After just a few days of immobilization, the muscles of the thigh which control the knee become weakened and shrink (atrophy). Knee exercises are designed to build up the tone and strength of the thigh muscles and to improve knee motion. Often times heat used for twenty to thirty minutes before working out will loosen up your tissues and help with improving the range of motion but do not use heat for the first two weeks following surgery. These exercises can be done on a training (exercise) mat, on the floor, on a table or on a bed. Use what ever works the best and is most comfortable for you Knee exercises include:  °Leg Lifts - While your knee is still immobilized in a splint or cast, you can do straight leg raises. Lift the leg to 60 degrees, hold for 3 sec, and slowly lower the leg. Repeat 10-20 times 2-3 times daily. Perform this exercise against resistance later as your knee gets better.  °Quad and Hamstring Sets - Tighten up the muscle on the front of the thigh (Quad) and hold for 5-10 sec. Repeat this 10-20 times hourly. Hamstring sets are done by pushing the  foot backward against an object and holding for 5-10 sec. Repeat as with quad sets.  °· Leg Slides: Lying on your back, slowly slide your foot toward your buttocks, bending your knee up off the floor (only go as far as is comfortable). Then slowly slide your foot back down until your leg is flat on the floor again. °· Angel Wings: Lying on your back spread your legs to the side as far apart as you can without causing discomfort.  °A rehabilitation program following serious knee injuries can speed recovery and prevent re-injury in the future due to weakened muscles. Contact your doctor or a physical therapist for more information on knee rehabilitation.  ° °IF YOU ARE TRANSFERRED TO A SKILLED REHAB FACILITY °If the patient is transferred to a skilled rehab facility following release from the hospital, a list of the current medications will be sent to the facility for the patient to continue.  When discharged from the skilled rehab facility, please have the facility set up the patient's Home Health Physical Therapy prior to being released. Also, the skilled facility will be responsible for providing the patient with their medications at time of release from the facility to include their pain medication, the muscle relaxants, and their blood thinner medication. If the patient is still at the rehab facility at time of the two week follow up appointment, the skilled rehab facility will also need to assist the patient in arranging follow up appointment in our office and any transportation needs. ° °MAKE SURE YOU:  °Understand these instructions.  °Get help right away if you are not doing well or get worse.  ° ° °Pick up stool softner and laxative for home use following surgery while on pain medications. °Do not submerge incision under water. °Please use good hand washing techniques while changing dressing each day. °May shower starting three days after surgery. °Please use a clean towel to pat the incision dry following  showers. °Continue to use ice for pain and swelling after surgery. °Do not use any lotions or creams on the incision until instructed by your surgeon. ° ° °Take Xarelto 10 mg daily for ten days, then change to Aspirin 325 mg daily for two weeks, then reduce to Baby Aspirin 81 mg daily for three additional weeks. ° ° ° ° ° °Information   on my medicine - XARELTO® (Rivaroxaban) ° °Why was Xarelto® prescribed for you? °Xarelto® was prescribed for you to reduce the risk of blood clots forming after orthopedic surgery. The medical term for these abnormal blood clots is venous thromboembolism (VTE). ° °What do you need to know about xarelto® ? °Take your Xarelto® ONCE DAILY at the same time every day. °You may take it either with or without food. ° °If you have difficulty swallowing the tablet whole, you may crush it and mix in applesauce just prior to taking your dose. ° °Take Xarelto® exactly as prescribed by your doctor and DO NOT stop taking Xarelto® without talking to the doctor who prescribed the medication.  Stopping without other VTE prevention medication to take the place of Xarelto® may increase your risk of developing a clot. ° °After discharge, you should have regular check-up appointments with your healthcare provider that is prescribing your Xarelto®.   ° °What do you do if you miss a dose? °If you miss a dose, take it as soon as you remember on the same day then continue your regularly scheduled once daily regimen the next day. Do not take two doses of Xarelto® on the same day.  ° °Important Safety Information °A possible side effect of Xarelto® is bleeding. You should call your healthcare provider right away if you experience any of the following: °? Bleeding from an injury or your nose that does not stop. °? Unusual colored urine (red or dark brown) or unusual colored stools (red or black). °? Unusual bruising for unknown reasons. °? A serious fall or if you hit your head (even if there is no  bleeding). ° °Some medicines may interact with Xarelto® and might increase your risk of bleeding while on Xarelto®. To help avoid this, consult your healthcare provider or pharmacist prior to using any new prescription or non-prescription medications, including herbals, vitamins, non-steroidal anti-inflammatory drugs (NSAIDs) and supplements. ° °This website has more information on Xarelto®: www.xarelto.com. ° ° ° °

## 2017-01-21 NOTE — Anesthesia Postprocedure Evaluation (Signed)
Anesthesia Post Note  Patient: Joanna Willis  Procedure(s) Performed: Procedure(s) (LRB): UNICOMPARTMENTAL LEFT KNEE (Left)     Patient location during evaluation: PACU Anesthesia Type: Spinal and Regional Level of consciousness: oriented and awake and alert Pain management: pain level controlled Vital Signs Assessment: post-procedure vital signs reviewed and stable Respiratory status: spontaneous breathing, respiratory function stable and patient connected to nasal cannula oxygen Cardiovascular status: blood pressure returned to baseline and stable Postop Assessment: no headache and no backache Anesthetic complications: no    Last Vitals:  Vitals:   01/21/17 1452 01/21/17 1556  BP: 108/63 113/69  Pulse: 82 75  Resp: 16 15  Temp: (!) 36.4 C 36.4 C    Last Pain:  Vitals:   01/21/17 1556  TempSrc: Oral  PainSc:                  Daysha Ashmore S

## 2017-01-21 NOTE — Op Note (Signed)
OPERATIVE REPORT-UNICOMPARTMENTAL ARTHROPLASTY  PREOPERATIVE DIAGNOSIS: Medial compartment osteoarthritis, Left knee  POSTOPERATIVE DIAGNOSIS: Medial compartment osteoarthritis, Left knee  PROCEDURE:Left knee medial unicompartmental arthroplasty.   SURGEON: [name], MD   ASSISTANT: Youlanda Mighty, PA-S  ANESTHESIA:  Adductor canal block and spinal  ESTIMATED BLOOD LOSS: Minimal.   DRAINS: Hemovac x1.   TOURNIQUET TIME:   Total Tourniquet Time Documented: Thigh (Left) - 29 minutes Total: Thigh (Left) - 29 minutes    COMPLICATIONS: None.   CONDITION: Stable to recovery.   BRIEF CLINICAL NOTE:Joanna Willis is a 61 y.o. female, who has  significant isolated medial compartment arthritis of the Left knee. The patient has had nonoperative management including injections of cortisone and viscous supplements. Unfortunately, the pain persists.  Radiograph showed isolated medial compartment bone-on-bone arthritis  with normal-appearing patellofemoral and lateral compartments. The patient presents now for left knee unicompartmental arthroplasty.   PROCEDURE IN DETAIL: After successful administration of  Adductor canal block and spinal anesthetic, a tourniquet was placed high on the  Left thigh and the Left lower extremity prepped and draped in usual sterile fashion. Extremity was wrapped in an Esmarch, knee flexed, and tourniquet inflated to 300 mmHg.       A midline incision was made with a 10 blade through subcutaneous  tissue to the extensor mechanism. A fresh blade was used to make a  medial parapatellar arthrotomy. Soft tissue on the proximal medial  tibia subperiosteally elevated to the joint line with a knife and into  the semimembranosus bursa with a Cobb elevator. The patella was  subluxed laterally, and the knee flexed 90 degrees. The ACL was intact.  The marginal osteophytes on the medial femur and tibia were removed with  a rongeur. The medial meniscus was  also removed. The femoral cutting  block for the conformis unicompartmental knee system was placed along  the femur. There was excellent fit. I traced the outline. We then  removed any remaining cartilage within this outline. We then placed the  cutting block again and pinned in position. The posterior femoral cut  was made, it was approximately 5 mm. The lug holes for the femoral  component were then drilled through the cutting block. The cutting  block was subsequently removed. We then utilized the high speed burr to  create a small trough at the superior aspect of the component tomake it inset and would not overhang the cartilage. The trial was placed,  it had excellent fit. The trial was subsequently removed.       The trial was placed again and the B chip was placed. There was  excellent balance throughout full motion. Also with excellent fit on  the tibia. This was removed as was the femoral trial. A curette was  used to remove any remaining cartilage from the tibia. The tibial  cutting block was then placed and there was a perfect fit on the tibial  surface. The appropriate slope was placed and it was pinned in  position. The reciprocating saw was used to make the central cut and  then the oscillating saw used to make the horizontal cut. The bone  fragment was then removed. The tibial trial was placed and had perfect  fit on the tibia. We then drilled the 2 lug holes and did the keel punch.  We then placed tibia trial and femur trial, and a 6 mm trial insert. There was  excellent stability throughout full range of motion and no impingement.  The trial was  then removed. We drilled small holes in the distal  femur in order to create more conduits for the cement. The cut bone  surfaces were thoroughly irrigated with pulsatile lavage while the  cement was mixed on the back table. We then cemented the tibial  component into place, impacted it and removed the extruded cement. The  same  was done for the femoral component. Trial 6-mm inserts placed,  knee held in full extension, and all extruded cement removed. While the  cement was hardening, I injected the extensor mechanism, periosteum of  the femur and subcu tissues, a total of 20 mL of Exparel mixed with 30  mL of saline and then did an additional injection of 20 mL of 0.25%  Marcaine into the same tissues. When the cement had fully hardened,  then the permanent polyethylene was placed in tibial tray. There was  excellent stability throughout full range of motion with no lift off the  component and no evidence of any impingement.       Wound was copiously irrigated with saline solution, and the arthrotomy closed over a Hemovac drain with a running #1 V-Loc suture. The subcutaneous was closed with  interrupted 2-0 Vicryl and subcuticular running 4-0 Monocryl. The drain  was hooked to suction. Incision cleaned and dried and Steri-Strips and  a bulky sterile dressing applied. The tourniquet was released after a  total time of 29 minutes. This was done after closing the extensor  mechanism. The wound was closed and a bulky sterile dressing was  applied. The operative limb was placed into a knee immobilizer, and the patient awakened and transported to recovery room in stable condition.       Please note that a surgical assistant was a medical necessity for this  procedure in order to perform it in a safe and expeditious manner.  Assistance was necessary for retracting vital ligaments, neurovascular  structures, as well as for proper positioning of the limb to allow for  appropriate bone cuts and appropriate placement of the prosthesis.    Dione Plover Ryken Paschal, MD

## 2017-01-21 NOTE — Transfer of Care (Signed)
Immediate Anesthesia Transfer of Care Note  Patient: Joanna Willis  Procedure(s) Performed: Procedure(s): UNICOMPARTMENTAL LEFT KNEE (Left)  Patient Location: PACU  Anesthesia Type:Spinal and MAC combined with regional for post-op pain  Level of Consciousness: awake, alert  and oriented  Airway & Oxygen Therapy: Patient Spontanous Breathing and Patient connected to face mask oxygen  Post-op Assessment: Report given to RN and Post -op Vital signs reviewed and stable  Post vital signs: Reviewed and stable  Last Vitals:  Vitals:   01/21/17 0937 01/21/17 0942  BP: 99/72 101/69  Pulse: (!) 59 (!) 59  Resp: 10 (!) 7  Temp:      Last Pain:  Vitals:   01/21/17 0756  TempSrc: Oral         Complications: No apparent anesthesia complications

## 2017-01-21 NOTE — Anesthesia Procedure Notes (Signed)
Performed by: Cariah Salatino       

## 2017-01-21 NOTE — Progress Notes (Signed)
Assisted Dr. Rose with left, ultrasound guided, adductor canal block. Side rails up, monitors on throughout procedure. See vital signs in flow sheet. Tolerated Procedure well.  

## 2017-01-21 NOTE — Evaluation (Signed)
Physical Therapy Evaluation Patient Details Name: Joanna Willis MRN: [id number] DOB: December 15, 1955 Today's Date: 01/21/2017   History of Present Illness  Pt s/p L UKR  Clinical Impression  Pt s/p L UKR and presents with decreased L LE strength/ROM and post op pain and nausea limiting functional mobility.  Pt should progress to dc home with family assist.    Follow Up Recommendations Outpatient PT;DC plan and follow up therapy as arranged by surgeon    Equipment Recommendations  None recommended by PT    Recommendations for Other Services       Precautions / Restrictions Precautions Precautions: Knee;Fall Required Braces or Orthoses: Knee Immobilizer - Left Knee Immobilizer - Left: Discontinue once straight leg raise with < 10 degree lag Restrictions Weight Bearing Restrictions: No Other Position/Activity Restrictions: WBAT      Mobility  Bed Mobility Overal bed mobility: Needs Assistance Bed Mobility: Supine to Sit     Supine to sit: Min guard     General bed mobility comments: cues for sequence and use of R LE to self assist  Transfers Overall transfer level: Needs assistance Equipment used: Rolling walker (2 wheeled) Transfers: Sit to/from Stand Sit to Stand: Min assist         General transfer comment: cues for LE management and use of UEs to self assist  Ambulation/Gait Ambulation/Gait assistance: Min assist Ambulation Distance (Feet): 7 Feet Assistive device: Rolling walker (2 wheeled) Gait Pattern/deviations: Step-to pattern;Decreased step length - right;Decreased step length - left;Shuffle;Trunk flexed Gait velocity: decr Gait velocity interpretation: Below normal speed for age/gender General Gait Details: cues for sequence, posture and position from RW; ltd by N&V  Stairs            Wheelchair Mobility    Modified Rankin (Stroke Patients Only)       Balance                                             Pertinent  Vitals/Pain Pain Assessment: 0-10 Pain Score: 3  Pain Location: R knee Pain Descriptors / Indicators: Aching;Sore Pain Intervention(s): Limited activity within patient's tolerance;Monitored during session;Premedicated before session;Ice applied    Home Living Family/patient expects to be discharged to:: Private residence Living Arrangements: Spouse/significant other Available Help at Discharge: Family Type of Home: House Home Access: [place]: One [place]: Environmental consultant - 2 wheels;Bedside commode      Prior Function Level of Independence: Independent         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     Hand Dominance        Extremity/Trunk Assessment   Upper Extremity Assessment Upper Extremity Assessment: Overall WFL for tasks assessed    Lower Extremity Assessment Lower Extremity Assessment: LLE deficits/detail    Cervical / Trunk Assessment Cervical / Trunk Assessment: Normal  Communication   Communication: No difficulties  Cognition Arousal/Alertness: Awake/alert Behavior During Therapy: WFL for tasks assessed/performed Overall Cognitive Status: Within Functional Limits for tasks assessed                                        General Comments      Exercises Total Joint Exercises Ankle Circles/Pumps: AROM;Both;15 reps;Supine Quad Sets: AROM;Both;5 reps;Supine   Assessment/Plan  PT Assessment Patient needs continued PT services  PT Problem List Decreased strength;Decreased range of motion;Decreased activity tolerance;Decreased mobility;Decreased knowledge of use of DME;Pain       PT Treatment Interventions DME instruction;Gait training;Functional mobility training;Therapeutic activities;Therapeutic exercise;Patient/family education    PT Goals (Current goals can be found in the Care Plan section)  Acute Rehab PT Goals Patient Stated Goal: Regain IND PT Goal Formulation: With patient Time For Goal  Achievement: 01/28/17 Potential to Achieve Goals: Good    Frequency 7X/week   Barriers to discharge        Co-evaluation               AM-PAC PT "6 Clicks" Daily Activity  Outcome Measure Difficulty turning over in bed (including adjusting bedclothes, sheets and blankets)?: Total Difficulty moving from lying on back to sitting on the side of the bed? : Total Difficulty sitting down on and standing up from a chair with arms (e.g., wheelchair, bedside commode, etc,.)?: Total Help needed moving to and from a bed to chair (including a wheelchair)?: A Little Help needed walking in hospital room?: A Little Help needed climbing 3-5 steps with a railing? : A Little 6 Click Score: 12    End of Session Equipment Utilized During Treatment: Gait belt;Left knee immobilizer Activity Tolerance: Other (comment) (nausea) Patient left: in chair;with call bell/phone within reach Nurse Communication: Mobility status PT Visit Diagnosis: Difficulty in walking, not elsewhere classified (R26.2)    Time: 6767-2094 PT Time Calculation (min) (ACUTE ONLY): 22 min   Charges:   PT Evaluation $PT Eval Low Complexity: 1 Low     PT G Codes:   PT G-Codes **NOT FOR INPATIENT CLASS** Functional Assessment Tool Used: Clinical judgement Functional Limitation: Mobility: Walking and moving around Mobility: Walking and Moving Around Current Status (B0962): At least 20 percent but less than 40 percent impaired, limited or restricted Mobility: Walking and Moving Around Goal Status [phone]): At least 1 percent but less than 20 percent impaired, limited or restricted    Pg [phone]   Awesome Jared 01/21/2017, 5:11 PM

## 2017-01-22 DIAGNOSIS — Z96652 Presence of left artificial knee joint: Secondary | ICD-10-CM | POA: Diagnosis not present

## 2017-01-22 DIAGNOSIS — K219 Gastro-esophageal reflux disease without esophagitis: Secondary | ICD-10-CM | POA: Diagnosis not present

## 2017-01-22 DIAGNOSIS — Z79899 Other long term (current) drug therapy: Secondary | ICD-10-CM | POA: Diagnosis not present

## 2017-01-22 DIAGNOSIS — E7800 Pure hypercholesterolemia, unspecified: Secondary | ICD-10-CM | POA: Diagnosis not present

## 2017-01-22 DIAGNOSIS — Z7982 Long term (current) use of aspirin: Secondary | ICD-10-CM | POA: Diagnosis not present

## 2017-01-22 DIAGNOSIS — M1712 Unilateral primary osteoarthritis, left knee: Secondary | ICD-10-CM | POA: Diagnosis not present

## 2017-01-22 DIAGNOSIS — F329 Major depressive disorder, single episode, unspecified: Secondary | ICD-10-CM | POA: Diagnosis not present

## 2017-01-22 DIAGNOSIS — Z880 Allergy status to penicillin: Secondary | ICD-10-CM | POA: Diagnosis not present

## 2017-01-22 LAB — BASIC METABOLIC PANEL
Anion gap: 5 (ref 5–15)
BUN: 11 mg/dL (ref 6–20)
CO2: 29 mmol/L (ref 22–32)
CREATININE: 0.65 mg/dL (ref 0.44–1.00)
Calcium: 8.9 mg/dL (ref 8.9–10.3)
Chloride: 105 mmol/L (ref 101–111)
Glucose, Bld: 114 mg/dL — ABNORMAL HIGH (ref 65–99)
POTASSIUM: 4.2 mmol/L (ref 3.5–5.1)
SODIUM: 139 mmol/L (ref 135–145)

## 2017-01-22 LAB — CBC
HCT: 38.9 % (ref 36.0–46.0)
Hemoglobin: 12.6 g/dL (ref 12.0–15.0)
MCH: 30.2 pg (ref 26.0–34.0)
MCHC: 32.4 g/dL (ref 30.0–36.0)
MCV: 93.3 fL (ref 78.0–100.0)
PLATELETS: 229 10*3/uL (ref 150–400)
RBC: 4.17 MIL/uL (ref 3.87–5.11)
RDW: 13.1 % (ref 11.5–15.5)
WBC: 12.4 10*3/uL — ABNORMAL HIGH (ref 4.0–10.5)

## 2017-01-22 MED ORDER — OXYCODONE HCL 5 MG PO TABS: 5.0000 mg | ORAL_TABLET | ORAL | 0 refills | Status: DC | PRN

## 2017-01-22 MED ORDER — METHOCARBAMOL 500 MG PO TABS: 500.0000 mg | ORAL_TABLET | Freq: Four times a day (QID) | ORAL | 0 refills | Status: DC | PRN

## 2017-01-22 MED ORDER — RIVAROXABAN 10 MG PO TABS: 10.0000 mg | ORAL_TABLET | Freq: Every day | ORAL | 0 refills | Status: DC

## 2017-01-22 MED FILL — oxyCODONE HCL 5 MG TABS: 5 | 6 days supply | Qty: 80 | Fill #0

## 2017-01-22 MED FILL — XARELTO 10 MG TABLET: 10 | 10 days supply | Qty: 10 | Fill #0

## 2017-01-22 MED FILL — METHOCARBAMOL 500 MG TABLET: 500 | 20 days supply | Qty: 80 | Fill #0

## 2017-01-22 NOTE — Discharge Summary (Signed)
Physician Discharge Summary   Patient ID: Joanna Willis MRN: [id number] DOB/AGE: 60-02-1956 60 y.o.  Admit date: 01/21/2017 Discharge date: 01/22/2017  Primary Diagnosis:  Medial compartment osteoarthritis, Left knee  Admission Diagnoses:  Past Medical History:  Diagnosis Date  . Depression   . GERD (gastroesophageal reflux disease)   . Hypercholesteremia   . PONV (postoperative nausea and vomiting)   . Postmenopausal   . Wears glasses    Discharge Diagnoses:   Principal Problem:   OA (osteoarthritis) of knee  Estimated body mass index is 25.18 kg/m as calculated from the following:   Height as of this encounter: _0  (1.676 m).   Weight as of this encounter: 70.8 kg (156 lb).  Procedure:  Procedure(s) (LRB): UNICOMPARTMENTAL LEFT KNEE (Left)   Consults: None  HPI: Joanna Willis is a 61 y.o. female, who has  significant isolated medial compartment arthritis of the Left knee. The patient has had nonoperative management including injections of cortisone and viscous supplements. Unfortunately, the pain persists.  Radiograph showed isolated medial compartment bone-on-bone arthritis  with normal-appearing patellofemoral and lateral compartments. The patient presents now for left knee unicompartmental arthroplasty.   Laboratory Data: Admission on 01/21/2017  Component Date Value Ref Range Status  . WBC 01/22/2017 12.4* 4.0 - 10.5 K/uL Final  . RBC 01/22/2017 4.17  3.87 - 5.11 MIL/uL Final  . Hemoglobin 01/22/2017 12.6  12.0 - 15.0 g/dL Final  . HCT 01/22/2017 38.9  36.0 - 46.0 % Final  . MCV 01/22/2017 93.3  78.0 - 100.0 fL Final  . MCH 01/22/2017 30.2  26.0 - 34.0 pg Final  . MCHC 01/22/2017 32.4  30.0 - 36.0 g/dL Final  . RDW 01/22/2017 13.1  11.5 - 15.5 % Final  . Platelets 01/22/2017 229  150 - 400 K/uL Final  . Sodium 01/22/2017 139  135 - 145 mmol/L Final  . Potassium 01/22/2017 4.2  3.5 - 5.1 mmol/L Final  . Chloride 01/22/2017 105  101 - 111 mmol/L Final  .  CO2 01/22/2017 29  22 - 32 mmol/L Final  . Glucose, Bld 01/22/2017 114* 65 - 99 mg/dL Final  . BUN 01/22/2017 11  6 - 20 mg/dL Final  . Creatinine, Ser 01/22/2017 0.65  0.44 - 1.00 mg/dL Final  . Calcium 01/22/2017 8.9  8.9 - 10.3 mg/dL Final  . GFR calc non Af Amer 01/22/2017 >60  >60 mL/min Final  . GFR calc Af Amer 01/22/2017 >60  >60 mL/min Final   Comment: (NOTE) The eGFR has been calculated using the CKD EPI equation. This calculation has not been validated in all clinical situations. eGFR's persistently <60 mL/min signify possible Chronic Kidney Disease.   Georgiann Hahn gap 01/22/2017 5  5 - 15 Final  Hospital Outpatient Visit on 01/14/2017  Component Date Value Ref Range Status  . aPTT 01/14/2017 30  24 - 36 seconds Final  . Prothrombin Time 01/14/2017 12.7  11.4 - 15.2 seconds Final  . INR 01/14/2017 0.95   Final  . ABO/RH(D) 01/14/2017 O NEG   Final  . Antibody Screen 01/14/2017 NEG   Final  . Sample Expiration 01/14/2017 01/24/2017   Final  . Extend sample reason 01/14/2017 NO TRANSFUSIONS OR PREGNANCY IN THE PAST 3 MONTHS   Final  . MRSA, PCR 01/14/2017 INVALID RESULTS, SPECIMEN SENT FOR CULTURE* NEGATIVE Final   Comment: RESULT CALLED TO, READ BACK BY AND VERIFIED WITH: MISTY AT 1344 ON 01/14/17 BY S.VANHOORNE MLS   . Staphylococcus aureus 01/14/2017 INVALID  RESULTS, SPECIMEN SENT FOR CULTURE* NEGATIVE Final   Comment: RESULT CALLED TO, READ BACK BY AND VERIFIED WITH: MISTY AT 1344 ON 01/14/17 BY S.VANHOORNE MLS        The Xpert SA Assay (FDA approved for NASAL specimens in patients over 67 years of age), is one component of a comprehensive surveillance program.  Test performance has been validated by [facility] for patients greater than or equal to 91 year old. It is not intended to diagnose infection nor to guide or monitor treatment.   . ABO/RH(D) 01/14/2017 O NEG   Final  . Specimen Description 01/14/2017 NOSE   Final  . Special Requests 01/14/2017 NONE   Final    . Culture 01/14/2017    Final                   Value:NO MRSA DETECTED Performed at [facility], [place] [address] [place], [place] [address]   . Report Status 01/14/2017 01/15/2017 FINAL   Final     X-Rays:No results found.  EKG:No orders found for this or any previous visit.   Hospital Course: Joanna Willis is a 61 y.o. who was admitted to [facility] They were brought to the operating room on 01/21/2017 and underwent Procedure(s): UNICOMPARTMENTAL LEFT KNEE.  Patient tolerated the procedure well and was later transferred to the recovery room and then to the orthopaedic floor for postoperative care.  They were given PO and IV analgesics for pain control following their surgery.  They were given 24 hours of postoperative antibiotics of  Anti-infectives    Start     Dose/Rate Route Frequency Ordered Stop   01/21/17 1600  ceFAZolin (ANCEF) IVPB 2g/100 mL premix     2 g 200 mL/hr over 30 Minutes Intravenous Every 6 hours 01/21/17 1303 01/21/17 2215   01/21/17 0809  ceFAZolin (ANCEF) 2-4 GM/100ML-% IVPB    Comments:  Bridget Hartshorn   : cabinet override      01/21/17 0809 01/21/17 1003   01/21/17 0803  ceFAZolin (ANCEF) IVPB 2g/100 mL premix     2 g 200 mL/hr over 30 Minutes Intravenous On call to O.R. 01/21/17 0803 01/21/17 1018     and started on DVT prophylaxis in the form of Xarelto.   PT and OT were ordered for postop therapy protocol.  Discharge planning consulted to help with postop disposition and equipment needs.  Patient had a good night on the evening of surgery.  They started to get up OOB with therapy on day one. Hemovac drain was pulled without difficulty.  Patient was seen in rounds on day one and it was felt that as long as they did well with the remaining sessions of therapy that they would be ready to go home.  Arrangements were made and they were setup to go home on POD 1.  Discharge home - straight to outpatient at [facility] Follow  up - in 2 weeks Activity - WBAT Dressing - May remove the surgical dressing tomorrow at home and then apply a dry gauze dressing daily. May shower three days following surgery but do not submerge the incision under water. Disposition - Home Condition Upon Discharge - Stable D/C Meds - See DC Summary DVT Prophylaxis Xarelto 10 mg daily for ten days, then change to Aspirin 325 mg daily for two weeks, then reduce to Baby Aspirin 81 mg daily for three additional weeks.  Discharge Instructions    Call  MD / Call 911    Complete by:  As directed    If you experience chest pain or shortness of breath, CALL 911 and be transported to the hospital emergency room.  If you develope a fever above 101 F, pus (white drainage) or increased drainage or redness at the wound, or calf pain, call your surgeon's office.   Change dressing    Complete by:  As directed    Change dressing daily with sterile 4 x 4 inch gauze dressing and apply TED hose. Do not submerge the incision under water.   Constipation Prevention    Complete by:  As directed    Drink plenty of fluids.  Prune juice may be helpful.  You may use a stool softener, such as Colace (over the counter) 100 mg twice a day.  Use MiraLax (over the counter) for constipation as needed.   Diet - low sodium heart healthy    Complete by:  As directed    Discharge instructions    Complete by:  As directed    Xarelto 10 mg daily for ten days, then change to Aspirin 325 mg daily for two weeks, then reduce to Baby Aspirin 81 mg daily for three additional weeks.   Pick up stool softner and laxative for home use following surgery while on pain medications. Do not submerge incision under water. Please use good hand washing techniques while changing dressing each day. May shower starting three days after surgery. Please use a clean towel to pat the incision dry following showers. Continue to use ice for pain and swelling after surgery. Do not use any lotions or  creams on the incision until instructed by your surgeon.  Wear both TED hose on both legs during the day every day for three weeks, but may remove the TED hose at night at home.  Postoperative Constipation Protocol  Constipation - defined medically as fewer than three stools per week and severe constipation as less than one stool per week.  One of the most common issues patients have following surgery is constipation.  Even if you have a regular bowel pattern at home, your normal regimen is likely to be disrupted due to multiple reasons following surgery.  Combination of anesthesia, postoperative narcotics, change in appetite and fluid intake all can affect your bowels.  In order to avoid complications following surgery, here are some recommendations in order to help you during your recovery period.  Colace (docusate) - Pick up an over-the-counter form of Colace or another stool softener and take twice a day as long as you are requiring postoperative pain medications.  Take with a full glass of water daily.  If you experience loose stools or diarrhea, hold the colace until you stool forms back up.  If your symptoms do not get better within 1 week or if they get worse, check with your doctor.  Dulcolax (bisacodyl) - Pick up over-the-counter and take as directed by the product packaging as needed to assist with the movement of your bowels.  Take with a full glass of water.  Use this product as needed if not relieved by Colace only.   MiraLax (polyethylene glycol) - Pick up over-the-counter to have on hand.  MiraLax is a solution that will increase the amount of water in your bowels to assist with bowel movements.  Take as directed and can mix with a glass of water, juice, soda, coffee, or tea.  Take if you go more than two days without a  movement. Do not use MiraLax more than once per day. Call your doctor if you are still constipated or irregular after using this medication for 7 days in a row.  If  you continue to have problems with postoperative constipation, please contact the office for further assistance and recommendations.  If you experience "the worst abdominal pain ever" or develop nausea or vomiting, please contact the office immediatly for further recommendations for treatment.    Do not put a pillow under the knee. Place it under the heel.    Complete by:  As directed    Do not sit on low chairs, stoools or toilet seats, as it may be difficult to get up from low surfaces    Complete by:  As directed    Driving restrictions    Complete by:  As directed    No driving until released by the physician.   Increase activity slowly as tolerated    Complete by:  As directed    Lifting restrictions    Complete by:  As directed    No lifting until released by the physician.   Patient may shower    Complete by:  As directed    You may shower without a dressing once there is no drainage.  Do not wash over the wound.  If drainage remains, do not shower until drainage stops.   TED hose    Complete by:  As directed    Use stockings (TED hose) for 3 weeks on both leg(s).  You may remove them at night for sleeping.   Weight bearing as tolerated    Complete by:  As directed    Laterality:  left   Extremity:  Lower     Allergies as of 01/22/2017      Reactions   Penicillins Rash   As a child. Has patient had a PCN reaction causing immediate rash, facial/tongue/throat swelling, SOB or lightheadedness with hypotension: Unknown Has patient had a PCN reaction causing severe rash involving mucus membranes or skin necrosis: Unknown Has patient had a PCN reaction that required hospitalization: Unknown Has patient had a PCN reaction occurring within the last 10 years: Unknown If all of the above answers are "NO", then may proceed with Cephalosporin use.      Medication List    STOP taking these medications   doxycycline 100 MG capsule Commonly known as:  VIBRAMYCIN   estradiol 0.05  MG/24HR patch Commonly known as:  VIVELLE-DOT   meloxicam 15 MG tablet Commonly known as:  MOBIC     TAKE these medications   acetaminophen 500 MG tablet Commonly known as:  TYLENOL Take 1,000 mg by mouth every 6 (six) hours as needed.   atorvastatin 10 MG tablet Commonly known as:  LIPITOR Take 10 mg by mouth at bedtime.   benzonatate 200 MG capsule Commonly known as:  TESSALON Take one cap by mouth at bedtime as needed for cough.  May repeat in 4 to 6 hours   citalopram 20 MG tablet Commonly known as:  CELEXA Take 20 mg by mouth at bedtime.   fluticasone 50 MCG/ACT nasal spray Commonly known as:  FLONASE USE 1 SPRAY IN EACH NOSTRIL 2 TIMES A DAY   methocarbamol 500 MG tablet Commonly known as:  ROBAXIN Take 1 tablet (500 mg total) by mouth every 6 (six) hours as needed for muscle spasms.   omeprazole 20 MG capsule Commonly known as:  PRILOSEC Take 20 mg by mouth daily.   oxyCODONE  5 MG immediate release tablet Commonly known as:  Oxy IR/ROXICODONE Take 1-2 tablets (5-10 mg total) by mouth every 4 (four) hours as needed for moderate pain or severe pain.   rivaroxaban 10 MG Tabs tablet Commonly known as:  XARELTO Take 1 tablet (10 mg total) by mouth daily with breakfast. Xarelto 10 mg daily for ten days, then change to Aspirin 325 mg daily for two weeks, then reduce to Baby Aspirin 81 mg daily for three additional weeks.      Follow-up Information    [name], MD. Schedule an appointment as soon as possible for a visit on 02/05/2017.   Specialty:  Orthopedic Surgery Why:  Follow up with Dian Situ on Thursday 02/05/2017 Contact information: [address] [place] [address] [phone]           Signed: Arlee Muslim, PA-C Orthopaedic Surgery 01/22/2017, 7:44 AM

## 2017-01-22 NOTE — Evaluation (Signed)
Occupational Therapy Evaluation Patient Details Name: Joanna Willis MRN: [id number] DOB: [birth year] Today's Date: 01/22/2017    History of Present Illness s/p L uniknee   Clinical Impression   This 61 year old female was admitted for the above sx. All education was completed. No further OT is needed at this time    Follow Up Recommendations  Supervision/Assistance - 24 hour    Equipment Recommendations  Other (comment)    Recommendations for Other Services       Precautions / Restrictions Precautions Precautions: Knee;Fall Restrictions Weight Bearing Restrictions: No      Mobility Bed Mobility                  Transfers   Equipment used: Rolling walker (2 wheeled)   Sit to Stand: Supervision              Balance                                           ADL either performed or assessed with clinical judgement   ADL Overall ADL's : Needs assistance/impaired     Grooming: Wash/dry hands;Wash/dry face;Standing;Supervision/safety   Upper Body Bathing: Set up;Sitting   Lower Body Bathing: Supervison/ safety;Sit to/from stand   Upper Body Dressing : Set up;Sitting   Lower Body Dressing: Minimal assistance;Sit to/from stand   Toilet Transfer: Supervision/safety;Ambulation;BSC;RW   Toileting- Clothing Manipulation and Hygiene: Supervision/safety;Sit to/from stand   Tub/ Shower Transfer: Walk-in shower;Min guard;Ambulation     General ADL Comments: performed ADl at sink and performed bathroom transfers.  Reinforced knee precautions and educated on general safety     Vision         Perception     Praxis      Pertinent Vitals/Pain Pain Score: 2  Pain Location: R knee Pain Descriptors / Indicators: Sore Pain Intervention(s): Limited activity within patient's tolerance;Monitored during session;Premedicated before session;Repositioned;Ice applied     Hand Dominance     Extremity/Trunk Assessment Upper Extremity  Assessment Upper Extremity Assessment: Overall WFL for tasks assessed           Communication     Cognition Arousal/Alertness: Awake/alert Behavior During Therapy: WFL for tasks assessed/performed Overall Cognitive Status: Within Functional Limits for tasks assessed                                     General Comments       Exercises     Shoulder Instructions      Home Living Family/patient expects to be discharged to:: Private residence Living Arrangements: Spouse/significant other Available Help at Discharge: Family               Bathroom Shower/Tub: Walk-in Psychologist, prison and probation services: Standard     Home Equipment: Bedside commode;Shower seat          Prior Functioning/Environment Level of Independence: Independent                 OT Problem List:        OT Treatment/Interventions:      OT Goals(Current goals can be found in the care plan section) Acute Rehab OT Goals Patient Stated Goal: Regain IND OT Goal Formulation: All assessment and education complete, DC therapy  OT Frequency:     Barriers  to D/C:            Co-evaluation              AM-PAC PT "6 Clicks" Daily Activity     Outcome Measure Help from another person eating meals?: None Help from another person taking care of personal grooming?: A Little Help from another person toileting, which includes using toliet, bedpan, or urinal?: A Little Help from another person bathing (including washing, rinsing, drying)?: A Little Help from another person to put on and taking off regular upper body clothing?: A Little Help from another person to put on and taking off regular lower body clothing?: A Little 6 Click Score: 19   End of Session    Activity Tolerance: Patient tolerated treatment well Patient left: in chair;with call bell/phone within reach  OT Visit Diagnosis: Pain Pain - Right/Left: Left Pain - part of body: Knee                Time: 1005-1027 OT  Time Calculation (min): 22 min Charges:  OT General Charges $OT Visit: 1 Procedure OT Evaluation $OT Eval Low Complexity: 1 Procedure G-Codes: OT G-codes **NOT FOR INPATIENT CLASS** Functional Assessment Tool Used: Clinical judgement Functional Limitation: Self care Self Care Current Status (C3837): At least 1 percent but less than 20 percent impaired, limited or restricted Self Care Goal Status (R9396): At least 1 percent but less than 20 percent impaired, limited or restricted Self Care Discharge Status [phone]): At least 1 percent but less than 20 percent impaired, limited or restricted   Lesle Chris, OTR/L [phone] 01/22/2017  Spenser Cong 01/22/2017, 10:43 AM

## 2017-01-22 NOTE — Progress Notes (Signed)
Physical Therapy Treatment Patient Details Name: Joanna Willis MRN: [id number] DOB: [birth year] Today's Date: 01/22/2017    History of Present Illness s/p L uniknee    PT Comments    Pt motivated, progressing well with mobility and eager for return home.  Pt reports OP PT appt for tomorrow am.   Follow Up Recommendations  Outpatient PT;DC plan and follow up therapy as arranged by surgeon     Equipment Recommendations  None recommended by PT    Recommendations for Other Services OT consult     Precautions / Restrictions Precautions Precautions: Knee;Fall Required Braces or Orthoses: Knee Immobilizer - Left Knee Immobilizer - Left: Discontinue once straight leg raise with < 10 degree lag Restrictions Weight Bearing Restrictions: No Other Position/Activity Restrictions: WBAT    Mobility  Bed Mobility               General bed mobility comments: OOB with nursing  Transfers Overall transfer level: Needs assistance Equipment used: Rolling walker (2 wheeled) Transfers: Sit to/from Stand Sit to Stand: Supervision         General transfer comment: min cues for use of UEs to self assist  Ambulation/Gait Ambulation/Gait assistance: Min guard;Supervision Ambulation Distance (Feet): 200 Feet Assistive device: Rolling walker (2 wheeled) Gait Pattern/deviations: Step-to pattern;Step-through pattern;Decreased step length - right;Decreased step length - left;Shuffle;Antalgic;Trunk flexed Gait velocity: decr Gait velocity interpretation: Below normal speed for age/gender General Gait Details: cues for sequence, posture and position from [employer]            Wheelchair Mobility    Modified Rankin (Stroke Patients Only)       Balance                                            Cognition Arousal/Alertness: Awake/alert Behavior During Therapy: WFL for tasks assessed/performed Overall Cognitive Status: Within Functional Limits for tasks  assessed                                        Exercises Total Joint Exercises Ankle Circles/Pumps: AROM;Both;15 reps;Supine Quad Sets: AROM;Both;Supine;15 reps Heel Slides: AAROM;20 reps;Supine;Left Straight Leg Raises: AAROM;Left;20 reps;Supine Long Arc Quad: AAROM;5 reps;Seated;Left Goniometric ROM: AAROM L knee - 8 - 45    General Comments        Pertinent Vitals/Pain Pain Assessment: 0-10 Pain Score: 3  Pain Location: R knee at end of session - 7/10 with therex Pain Descriptors / Indicators: Sore Pain Intervention(s): Limited activity within patient's tolerance;Monitored during session;Premedicated before session (pt declines ice)    Home Living Family/patient expects to be discharged to:: Private residence Living Arrangements: Spouse/significant other Available Help at Discharge: Family         Home Equipment: Bedside commode;Shower seat      Prior Function Level of Independence: Independent          PT Goals (current goals can now be found in the care plan section) Acute Rehab PT Goals Patient Stated Goal: Regain IND PT Goal Formulation: With patient Time For Goal Achievement: 01/28/17 Potential to Achieve Goals: Good Progress towards PT goals: Progressing toward goals    Frequency    7X/week      PT Plan Current plan remains appropriate    Co-evaluation  AM-PAC PT "6 Clicks" Daily Activity  Outcome Measure  Difficulty turning over in bed (including adjusting bedclothes, sheets and blankets)?: Total Difficulty moving from lying on back to sitting on the side of the bed? : Total Difficulty sitting down on and standing up from a chair with arms (e.g., wheelchair, bedside commode, etc,.)?: A Little Help needed moving to and from a bed to chair (including a wheelchair)?: A Little Help needed walking in hospital room?: A Little Help needed climbing 3-5 steps with a railing? : A Little 6 Click Score: 14    End  of Session Equipment Utilized During Treatment: Gait belt;Left knee immobilizer Activity Tolerance: Patient tolerated treatment well Patient left: in chair;with call bell/phone within reach Nurse Communication: Mobility status PT Visit Diagnosis: Difficulty in walking, not elsewhere classified (R26.2)     Time: 9563-8756 PT Time Calculation (min) (ACUTE ONLY): 35 min  Charges:  $Gait Training: 8-22 mins $Therapeutic Exercise: 8-22 mins                    G Codes:       Pg [phone]    Joanna Willis 01/22/2017, 1:12 PM

## 2017-01-22 NOTE — Progress Notes (Signed)
Pt to d/c home. Plan is for Outpatient PT. No DME needs. AVS reviewed and "My Chart" discussed with pt. Pt capable of verbalizing medications, dressing changes, signs and symptoms of infection, and follow-up appointments. Remains hemodynamically stable. No signs and symptoms of distress. Educated pt to return to ER in the case of SOB, dizziness, or chest pain.

## 2017-01-22 NOTE — Progress Notes (Signed)
Physical Therapy Treatment Patient Details Name: Joanna Willis MRN: [id number] DOB: Dec 22, 1955 Today's Date: 01/22/2017    History of Present Illness s/p L uniknee    PT Comments    Pt motivated and progressing well with mobility.   Follow Up Recommendations  Outpatient PT;DC plan and follow up therapy as arranged by surgeon     Equipment Recommendations  None recommended by PT    Recommendations for Other Services OT consult     Precautions / Restrictions Precautions Precautions: Knee;Fall Required Braces or Orthoses: Knee Immobilizer - Left Knee Immobilizer - Left: Discontinue once straight leg raise with < 10 degree lag Restrictions Weight Bearing Restrictions: No Other Position/Activity Restrictions: WBAT    Mobility  Bed Mobility               General bed mobility comments: OOB with nursing  Transfers Overall transfer level: Needs assistance Equipment used: Rolling walker (2 wheeled) Transfers: Sit to/from Stand Sit to Stand: Min guard         General transfer comment: cues for LE management and use of UEs to self assist  Ambulation/Gait Ambulation/Gait assistance: Min assist;Min guard Ambulation Distance (Feet): 200 Feet Assistive device: Rolling walker (2 wheeled) Gait Pattern/deviations: Step-to pattern;Step-through pattern;Decreased step length - right;Decreased step length - left;Shuffle;Antalgic;Trunk flexed Gait velocity: decr Gait velocity interpretation: Below normal speed for age/gender General Gait Details: cues for sequence, posture and position from [employer]            Wheelchair Mobility    Modified Rankin (Stroke Patients Only)       Balance                                            Cognition Arousal/Alertness: Awake/alert Behavior During Therapy: WFL for tasks assessed/performed Overall Cognitive Status: Within Functional Limits for tasks assessed                                         Exercises Total Joint Exercises Ankle Circles/Pumps: AROM;Both;15 reps;Supine Quad Sets: AROM;Both;Supine;15 reps Heel Slides: AAROM;20 reps;Supine;Left Straight Leg Raises: AAROM;Left;20 reps;Supine Goniometric ROM: AAROM L knee -10 - 40 with ++muscle guarding    General Comments        Pertinent Vitals/Pain Pain Assessment: 0-10 Pain Score: 3  Pain Location: R knee Pain Descriptors / Indicators: Sore Pain Intervention(s): Limited activity within patient's tolerance;Monitored during session;Premedicated before session;Ice applied    Home Living Family/patient expects to be discharged to:: Private residence Living Arrangements: Spouse/significant other Available Help at Discharge: Family         Home Equipment: Bedside commode;Shower seat      Prior Function Level of Independence: Independent          PT Goals (current goals can now be found in the care plan section) Acute Rehab PT Goals Patient Stated Goal: Regain IND PT Goal Formulation: With patient Time For Goal Achievement: 01/28/17 Potential to Achieve Goals: Good Progress towards PT goals: Progressing toward goals    Frequency    7X/week      PT Plan Current plan remains appropriate    Co-evaluation              AM-PAC PT "6 Clicks" Daily Activity  Outcome Measure  Difficulty turning over  in bed (including adjusting bedclothes, sheets and blankets)?: Total Difficulty moving from lying on back to sitting on the side of the bed? : Total Difficulty sitting down on and standing up from a chair with arms (e.g., wheelchair, bedside commode, etc,.)?: Total Help needed moving to and from a bed to chair (including a wheelchair)?: A Little Help needed walking in hospital room?: A Little Help needed climbing 3-5 steps with a railing? : A Little 6 Click Score: 12    End of Session Equipment Utilized During Treatment: Gait belt;Left knee immobilizer Activity Tolerance: Patient  tolerated treatment well Patient left: in chair;with call bell/phone within reach Nurse Communication: Mobility status PT Visit Diagnosis: Difficulty in walking, not elsewhere classified (R26.2)     Time: 0383-3383 PT Time Calculation (min) (ACUTE ONLY): 47 min  Charges:  $Gait Training: 8-22 mins $Therapeutic Exercise: 23-37 mins                    G Codes:       Pg [phone]    Rozanne Heumann 01/22/2017, 11:46 AM

## 2017-01-22 NOTE — Progress Notes (Signed)
Discharge planning, no HH needs identified. Plan for OP PT, has DME. [phone] 

## 2017-01-22 NOTE — Progress Notes (Signed)
   Subjective: 1 Day Post-Op Procedure(s) (LRB): UNICOMPARTMENTAL LEFT KNEE (Left) Patient reports pain as mild.   Patient seen in rounds by Dr. Wynelle Link. Patient is well, but has had some minor complaints of pain in the knee, requiring pain medications Patient is ready to go home later today following therapy goals.  Objective: Vital signs in last 24 hours: Temp:  [97 F (36.1 C)-98.7 F (37.1 C)] 98.2 F (36.8 C) (08/02 0558) Pulse Rate:  [59-82] 67 (08/02 0558) Resp:  [7-31] 16 (08/02 0558) BP: (96-133)/(58-88) 112/73 (08/02 0558) SpO2:  [98 %-100 %] 100 % (08/02 0558) Weight:  [70.8 kg (156 lb)] 70.8 kg (156 lb) (08/01 1300)  Intake/Output from previous day:  Intake/Output Summary (Last 24 hours) at 01/22/17 0735 Last data filed at 01/22/17 0600  Gross per 24 hour  Intake          3942.82 ml  Output             3300 ml  Net           642.82 ml    Intake/Output this shift: No intake/output data recorded.  Labs:  Recent Labs  01/22/17 0537  HGB 12.6    Recent Labs  01/22/17 0537  WBC 12.4*  RBC 4.17  HCT 38.9  PLT 229    Recent Labs  01/22/17 0537  NA 139  K 4.2  CL 105  CO2 29  BUN 11  CREATININE 0.65  GLUCOSE 114*  CALCIUM 8.9   No results for input(s): LABPT, INR in the last 72 hours.  EXAM: General - Patient is Alert and Appropriate Extremity - Neurovascular intact Sensation intact distally Intact pulses distally Dorsiflexion/Plantar flexion intact Dressing - clean, dry Motor Function - intact, moving foot and toes well on exam.  Hemovac pulled without difficulty.  Assessment/Plan: 1 Day Post-Op Procedure(s) (LRB): UNICOMPARTMENTAL LEFT KNEE (Left) Procedure(s) (LRB): UNICOMPARTMENTAL LEFT KNEE (Left) Past Medical History:  Diagnosis Date  . Depression   . GERD (gastroesophageal reflux disease)   . Hypercholesteremia   . PONV (postoperative nausea and vomiting)   . Postmenopausal   . Wears glasses    Principal Problem:   OA  (osteoarthritis) of knee  Estimated body mass index is 25.18 kg/m as calculated from the following:   Height as of this encounter: 5\' 6"  (1.676 m).   Weight as of this encounter: 70.8 kg (156 lb). Up with therapy Discharge home - straight to outpatient at [facility] Follow up - in 2 weeks Activity - WBAT Dressing - May remove the surgical dressing tomorrow at home and then apply a dry gauze dressing daily. May shower three days following surgery but do not submerge the incision under water. Disposition - Home Condition Upon Discharge - Stable D/C Meds - See DC Summary DVT Prophylaxis Xarelto 10 mg daily for ten days, then change to Aspirin 325 mg daily for two weeks, then reduce to Baby Aspirin 81 mg daily for three additional weeks.  Arlee Muslim, PA-C Orthopaedic Surgery 01/22/2017, 7:35 AM

## 2017-01-23 ENCOUNTER — Ambulatory Visit: Payer: 59 | Attending: Orthopedic Surgery | Admitting: Physical Therapy

## 2017-01-23 DIAGNOSIS — M6281 Muscle weakness (generalized): Secondary | ICD-10-CM | POA: Insufficient documentation

## 2017-01-23 DIAGNOSIS — M25662 Stiffness of left knee, not elsewhere classified: Secondary | ICD-10-CM | POA: Diagnosis not present

## 2017-01-23 DIAGNOSIS — R2689 Other abnormalities of gait and mobility: Secondary | ICD-10-CM | POA: Insufficient documentation

## 2017-01-23 DIAGNOSIS — M25562 Pain in left knee: Secondary | ICD-10-CM | POA: Insufficient documentation

## 2017-01-23 DIAGNOSIS — R262 Difficulty in walking, not elsewhere classified: Secondary | ICD-10-CM | POA: Insufficient documentation

## 2017-01-23 NOTE — Therapy (Signed)
[facility] [address]  [place], [place], [address] Phone: [phone]   Fax:  [phone]  Physical Therapy Evaluation  Patient Details  Name: Joanna Willis MRN: [id number] Date of Birth: [birth year] Referring Provider: [name], MD  Encounter Date: 01/23/2017      PT End of Session - 01/23/17 0847    Visit Number 1   Number of Visits 12   Date for PT Re-Evaluation 02/20/17   Authorization Type [facility]   PT Start Time 0847   PT Stop Time 0935   PT Time Calculation (min) 48 min   Equipment Utilized During Treatment Left knee immobilizer   Activity Tolerance Patient limited by pain   Behavior During Therapy [facility] for tasks assessed/performed      Past Medical History:  Diagnosis Date  . Depression   . GERD (gastroesophageal reflux disease)   . Hypercholesteremia   . PONV (postoperative nausea and vomiting)   . Postmenopausal   . Wears glasses     Past Surgical History:  Procedure Laterality Date  . ABDOMINAL HYSTERECTOMY  2005   did not remove ovaries  . CESAREAN SECTION  2000  . COLONOSCOPY    . FOOT ARTHRODESIS  1995   right  . KNEE ARTHROSCOPY WITH MEDIAL MENISECTOMY Left 05/17/2014   Procedure: LEFT ARTHROSCOPY KNEE WITH PARTIAL MEDIAL MENISECTOMY, CHONDROPLASTY OF LATERAL FEMORAL CONDYLE;  Surgeon: Alta Corning, MD;  Location: [place] [facility] [place];  Service: Orthopedics;  Laterality: Left;  . PARTIAL KNEE ARTHROPLASTY Left 01/21/2017   Procedure: UNICOMPARTMENTAL LEFT KNEE;  Surgeon: [name], MD;  Location: [facility];  Service: Orthopedics;  Laterality: Left;    There were no vitals filed for this visit.       Subjective Assessment - 01/23/17 0852    Subjective Pt reports L knee pain x past 3 years with poor relief from conservative treatment. Underwent partial TKA on Wed 01/21/17. Discharged from hospital yesterday. Reporting acute flare-up of pain since acute care PT yesterday  interferring with sleep and severely limiting activity tolerance.   Patient is accompained by: Family member  husband   Pertinent History L unicompartmental knee replacement 01/21/17   Limitations Sitting;Standing;Walking   Patient Stated Goals "to get moving like normal"   Currently in Pain? Yes   Pain Score 4   up to 8-9/10 at worst   Pain Location Knee   Pain Orientation Left   Pain Descriptors / Indicators Aching   Pain Type Surgical pain   Pain Radiating Towards n/a   Pain Onset In the past 7 days   Pain Frequency Constant   Aggravating Factors  lifting leg, bending knee   Pain Relieving Factors pain meds, ice, elevation   Effect of Pain on Daily Activities very limited standing or walking tolerance            [facility] PT Assessment - 01/23/17 0847      Assessment   Medical Diagnosis L unicompartmental knee replacement   Referring Provider [name], MD   Onset Date/Surgical Date 01/21/17   Next MD Visit 02/05/17   Prior Therapy acute care PT; OP PT 2017      Precautions   Precautions Knee   Required Braces or Orthoses Knee Immobilizer - Left   Knee Immobilizer - Left Discontinue once straight leg raise with < 10 degree lag     Restrictions   LLE Weight Bearing Weight bearing as tolerated     Balance  Screen   Has the patient fallen in the past 6 months No   Has the patient had a decrease in activity level because of a fear of falling?  No   Is the patient reluctant to leave their home because of a fear of falling?  No     Home Environment   Living Environment Private residence   Type of [place] entrance   [place] Two level;Able to live on main level with bedroom/bathroom   Home Equipment Walker - 2 wheels     Prior Function   Level of Independence Independent   Vocation Part time employment   Vocation Requirements pulmonary rehab   Leisure gardening, cycling, quilting     Observation/Other Assessments   Observations pt  nauseated and uncomfortable due to pain & pain meds   Focus on Therapeutic Outcomes (FOTO)  Knee 15% (85% limitation); predicted 50% (50% limitaiton)     ROM / Strength   AROM / PROM / Strength AROM;PROM;Strength     AROM   AROM Assessment Site Knee   Right/Left Knee Right;Left   Left Knee Extension 8   Left Knee Flexion 45     PROM   Overall PROM  Unable to assess;Due to pain     Strength   Overall Strength Unable to assess;Due to pain   Overall Strength Comments L LE grossly <3/5 - pt unable lift L LE w/o assist or actively bend L knee     Flexibility   Soft Tissue Assessment /Muscle Length yes   Hamstrings WFL     Palpation   Patella mobility unable to assess d/t pain & guarding     Ambulation/Gait   Ambulation/Gait Yes   Ambulation/Gait Assistance 4: Min guard   Ambulation Distance (Feet) 5 Feet   Assistive device Rolling walker   Gait Pattern Step-to pattern;Antalgic;Decreased step length - right;Decreased step length - left;Decreased weight shift to left;Decreased stance time - left   Ambulation Surface Level;Indoor            Objective measurements completed on examination: See above findings.          [facility] Adult PT Treatment/Exercise - 01/23/17 0847      Exercises   Exercises Knee/Hip     Knee/Hip Exercises: Seated   Heel Slides Limitations instructions provided but pt unable to perform     Knee/Hip Exercises: Supine   Quad Sets Left;10 reps  5" hold   Short Arc Quad Sets Limitations instructions provided but pt unable to perform   Heel Slides Left;AAROM;5 reps   Heel Slides Limitations strap + PT assistance - poor tolerance with extremely limited motion   Straight Leg Raises Limitations unable   Other Supine Knee/Hip Exercises AAROM L hip ABD/ADD x10     Modalities   Modalities Vasopneumatic     Vasopneumatic   Number Minutes Vasopneumatic  15 minutes   Vasopnuematic Location  Knee   Vasopneumatic Pressure Medium;Low  reduced to low  after first 2 minutes per pt tol   Vasopneumatic Temperature  lowest                PT Education - 01/23/17 0928    Education provided Yes   Education Details PT eval findings, anticipated POC & initial HEP   Person(s) Educated Patient;Spouse   Methods Explanation;Demonstration;Handout   Comprehension Verbalized understanding;Returned demonstration;Need further instruction             PT Long Term Goals -  01/23/17 0928      PT LONG TERM GOAL #1   Title Independent with ongoing HEP    Status New   Target Date 02/20/17     PT LONG TERM GOAL #2   Title Pt will ambuate with even stride length and weight shift for normal gait pattern with or w/o least restrictive assistive device   Status New   Target Date 02/20/17     PT LONG TERM GOAL #3   Title L knee AROM >/= 3-120 for normal gait mechnics and stair negotiation   Status New   Target Date 02/20/17     PT LONG TERM GOAL #4   Title L hip and knee strength at least 4/5 or greater for improved gait stability and activity tolerance   Status New   Target Date 02/20/17     PT LONG TERM GOAL #5   Title Pt will be able to complete household chores +/- job tasks without limitation due to L knee pain or LOM   Status New   Target Date 02/20/17                Plan - 01/23/17 1914    Clinical Impression Statement Joanna Willis is a 61 y/o female who presents to OP PT for a moderate complexity eval 2 days s/p L unicompartmental knee replacement on 01/21/17. Pt arrives to PT via staxi chair as she is unable to walk more than a few steps at present due pain, with pt reporting she feels like she "overdid it" with PT while still at the hospital yesterday. Pain currently 4/10 at rest with no movement and LE supported, but up to 8-9/10 with any movement effort. Assessment limited by pain and associated muscle guarding with L knee AAROM 8-45 dg. Pt requiring assist to maneuver L LE during bed mobility and transfers, and demonstrates  severely antalgic gait in knee immobilizer with step-to pattern with severely shortened step length and minimal weight shift to L LE. Unable to assess strength due to limited motion and increased pain. POC will focus on increasing L knee ROM, improving LE soft tissue pliability, core/LE strengthening and stability training, gait training to normalize gait pattern working on weaning from knee immobilizer and AD as appropriate, with manual therapy as indicated for ROM/pain/edema and modalities including vasopnuematic compression PRN for pain/edema.   History and Personal Factors relevant to plan of care: L knee partial medial menisectomy 05/17/14; R foot arthodesis 1995   Clinical Presentation Evolving   Clinical Presentation due to: acute exacerabation of post-op pain limiting mobility and exercise tolerance   Clinical Decision Making Moderate   Rehab Potential Good   PT Frequency 3x / week   PT Duration 4 weeks   PT Treatment/Interventions Patient/family education;ADLs/Self Care Home Management;Therapeutic exercise;Therapeutic activities;Functional mobility training;Gait training;Stair training;Neuromuscular re-education;Balance training;Manual techniques;Scar mobilization;Passive range of motion;Taping;Dry needling;Cryotherapy;Vasopneumatic Device;Electrical Stimulation;Iontophoresis 4mg /ml Dexamethasone   PT Next Visit Plan review initial HEP; L knee P/AA/AROM; L LE strengthening as tolerated; gait training with RW to normalize gait pattern; modalities PRN for pain   Consulted and Agree with Plan of Care Patient;Family member/caregiver   Family Member Consulted husband      Patient will benefit from skilled therapeutic intervention in order to improve the following deficits and impairments:  Pain, Decreased range of motion, Decreased strength, Impaired flexibility, Increased muscle spasms, Increased edema, Decreased scar mobility, Decreased activity tolerance, Abnormal gait, Difficulty walking,  Decreased mobility, Decreased balance, Decreased endurance  Visit Diagnosis: Acute pain of left  knee  Stiffness of left knee, not elsewhere classified  Muscle weakness (generalized)  Other abnormalities of gait and mobility  Difficulty in walking, not elsewhere classified     Problem List Patient Active Problem List   Diagnosis Date Noted  . OA (osteoarthritis) of knee 01/21/2017  . Left knee pain 04/19/2014  . BRONCHITIS, ACUTE 07/21/2010    Percival Spanish, PT, MPT 01/23/2017, 1:58 PM  [facility] [address]  [place], [place], [address] Phone: [phone]   Fax:  [phone]  Name: Joanna Willis MRN: [id number] Date of Birth: 05-Nov-1955

## 2017-01-26 ENCOUNTER — Ambulatory Visit: Payer: 59

## 2017-01-26 DIAGNOSIS — M6281 Muscle weakness (generalized): Secondary | ICD-10-CM

## 2017-01-26 DIAGNOSIS — R2689 Other abnormalities of gait and mobility: Secondary | ICD-10-CM | POA: Diagnosis not present

## 2017-01-26 DIAGNOSIS — M25662 Stiffness of left knee, not elsewhere classified: Secondary | ICD-10-CM

## 2017-01-26 DIAGNOSIS — R262 Difficulty in walking, not elsewhere classified: Secondary | ICD-10-CM | POA: Diagnosis not present

## 2017-01-26 DIAGNOSIS — M25562 Pain in left knee: Secondary | ICD-10-CM

## 2017-01-26 NOTE — Therapy (Addendum)
[facility] [address]  [place], [place], [address] Phone: [phone]   Fax:  [phone]  Physical Therapy Treatment  Patient Details  Name: Joanna Willis MRN: [id number] Date of Birth: [birth year] Referring Provider: [name], MD  Encounter Date: 01/26/2017      PT End of Session - 01/26/17 1454    Visit Number 2   Number of Visits 12   Date for PT Re-Evaluation 02/20/17   Authorization Type [facility]   PT Start Time 1447   PT Stop Time 1547   PT Time Calculation (min) 60 min   Equipment Utilized During Treatment --   Activity Tolerance Patient tolerated treatment well   Behavior During Therapy [facility] for tasks assessed/performed      Past Medical History:  Diagnosis Date  . Depression   . GERD (gastroesophageal reflux disease)   . Hypercholesteremia   . PONV (postoperative nausea and vomiting)   . Postmenopausal   . Wears glasses     Past Surgical History:  Procedure Laterality Date  . ABDOMINAL HYSTERECTOMY  2005   did not remove ovaries  . CESAREAN SECTION  2000  . COLONOSCOPY    . FOOT ARTHRODESIS  1995   right  . KNEE ARTHROSCOPY WITH MEDIAL MENISECTOMY Left 05/17/2014   Procedure: LEFT ARTHROSCOPY KNEE WITH PARTIAL MEDIAL MENISECTOMY, CHONDROPLASTY OF LATERAL FEMORAL CONDYLE;  Surgeon: Alta Corning, MD;  Location: [facility];  Service: Orthopedics;  Laterality: Left;  . PARTIAL KNEE ARTHROPLASTY Left 01/21/2017   Procedure: UNICOMPARTMENTAL LEFT KNEE;  Surgeon: [name], MD;  Location: [facility];  Service: Orthopedics;  Laterality: Left;    There were no vitals filed for this visit.      Subjective Assessment - 01/26/17 1453    Subjective Pt. noting pain level is much improved today and she has been performing HEP, "Religiously".    Patient Stated Goals "to get moving like normal"   Currently in Pain? Yes   Pain Score 6    Pain Location Knee   Pain Orientation Left   Pain Descriptors / Indicators Aching   Pain Type Surgical pain   Pain Frequency Constant   Multiple Pain Sites No            [facility] PT Assessment - 01/26/17 1509      AROM   AROM Assessment Site Knee   Left Knee Flexion 77  with heel on peanut p-ball                      [facility] Adult PT Treatment/Exercise - 01/26/17 1502      Ambulation/Gait   Ambulation/Gait Yes   Ambulation/Gait Assistance 5: Supervision   Ambulation Distance (Feet) 90 Feet   Assistive device Rolling walker   Gait Pattern Step-to pattern;Antalgic;Decreased step length - right;Decreased step length - left;Decreased weight shift to left;Decreased stance time - left   Ambulation Surface Level;Indoor   Gait Comments Working on L wt. shift and upright posture     Knee/Hip Exercises: Aerobic   Nustep Lvl 1, 6 min      Knee/Hip Exercises: Seated   Heel Slides Left;10 reps     Knee/Hip Exercises: Supine   Quad Sets Left;10 reps  5" hold    Short Arc Quad Sets Left;15 reps   Short Arc Quad Sets Limitations with 8" bolster   Heel Slides Left;AAROM;10 reps   Heel Slides Limitations extremely limited motion  Heel Prop for Knee Extension Limitations with heels on peanut p-ball and ankle pumps x 20 reps   Straight Leg Raises Limitations unable   Other Supine Knee/Hip Exercises AAROM L hip ABD/ADD x 10   Other Supine Knee/Hip Exercises HS curl without assist with heels on peanut p-ball x 10 reps     Vasopneumatic   Number Minutes Vasopneumatic  15 minutes   Vasopnuematic Location  Knee   Vasopneumatic Pressure Low   Vasopneumatic Temperature  lowest                     PT Long Term Goals - 01/26/17 1454      PT LONG TERM GOAL #1   Title Independent with ongoing HEP    Status On-going     PT LONG TERM GOAL #2   Title Pt will ambuate with even stride length and weight shift for normal gait pattern with or w/o least restrictive assistive device   Status On-going     PT LONG  TERM GOAL #3   Title L knee AROM >/= 3-120 for normal gait mechnics and stair negotiation   Status On-going     PT LONG TERM GOAL #4   Title L hip and knee strength at least 4/5 or greater for improved gait stability and activity tolerance   Status On-going     PT LONG TERM GOAL #5   Title Pt will be able to complete household chores +/- job tasks without limitation due to L knee pain or LOM   Status On-going               Plan - 01/26/17 1455    Clinical Impression Statement Joanna Willis noting improvement in L knee pain levels today and with much improved tolerance for HEP.  Good overall technique with review however still with tendency to rely on strap assist for L knee positioning.  Notes she has been performing HEP "Religiously".  Much improved gait pattern today with RW demonstrating increased step length and wt. bearing on L.  Still unable to perform SLR and with difficulty performing SAQ secondary to pain/weakness.  Treatment ending with ice/compression to decrease post-exercise swelling and pain.   PT Treatment/Interventions Patient/family education;ADLs/Self Care Home Management;Therapeutic exercise;Therapeutic activities;Functional mobility training;Gait training;Stair training;Neuromuscular re-education;Balance training;Manual techniques;Scar mobilization;Passive range of motion;Taping;Dry needling;Cryotherapy;Vasopneumatic Device;Electrical Stimulation;Iontophoresis 4mg /ml Dexamethasone   PT Next Visit Plan Add SLR as pt. is able; L knee P/AA/AROM; L LE strengthening as tolerated; gait training with RW to normalize gait pattern; modalities PRN for pain      Patient will benefit from skilled therapeutic intervention in order to improve the following deficits and impairments:  Pain, Decreased range of motion, Decreased strength, Impaired flexibility, Increased muscle spasms, Increased edema, Decreased scar mobility, Decreased activity tolerance, Abnormal gait, Difficulty walking,  Decreased mobility, Decreased balance, Decreased endurance  Visit Diagnosis: Acute pain of left knee  Stiffness of left knee, not elsewhere classified  Muscle weakness (generalized)  Other abnormalities of gait and mobility  Difficulty in walking, not elsewhere classified     Problem List Patient Active Problem List   Diagnosis Date Noted  . OA (osteoarthritis) of knee 01/21/2017  . Left knee pain 04/19/2014  . BRONCHITIS, ACUTE 07/21/2010    Bess Harvest, PTA 01/26/17 4:08 PM  [facility] [address]  [place], [place], [address] Phone: [phone]   Fax:  [phone]  Name: Joanna Willis MRN: [id number] Date of  Birth: [birth year]

## 2017-01-28 ENCOUNTER — Ambulatory Visit: Payer: 59 | Admitting: Physical Therapy

## 2017-01-28 DIAGNOSIS — M25562 Pain in left knee: Secondary | ICD-10-CM | POA: Diagnosis not present

## 2017-01-28 DIAGNOSIS — M25662 Stiffness of left knee, not elsewhere classified: Secondary | ICD-10-CM | POA: Diagnosis not present

## 2017-01-28 DIAGNOSIS — M6281 Muscle weakness (generalized): Secondary | ICD-10-CM

## 2017-01-28 DIAGNOSIS — R2689 Other abnormalities of gait and mobility: Secondary | ICD-10-CM | POA: Diagnosis not present

## 2017-01-28 DIAGNOSIS — R262 Difficulty in walking, not elsewhere classified: Secondary | ICD-10-CM

## 2017-01-28 NOTE — Therapy (Signed)
[facility] [address]  [place], [place], [address] Phone: [phone]   Fax:  [phone]  Physical Therapy Treatment  Patient Details  Name: Joanna Willis MRN: [id number] Date of Birth: Nov 20, 1955 Referring Provider: [name], MD  Encounter Date: 01/28/2017      PT End of Session - 01/28/17 0808    Visit Number 3   Number of Visits 12   Date for PT Re-Evaluation 02/20/17   Authorization Type [facility]   PT Start Time 0802   PT Stop Time 0908   PT Time Calculation (min) 66 min   Activity Tolerance Patient tolerated treatment well   Behavior During Therapy [facility] for tasks assessed/performed      Past Medical History:  Diagnosis Date  . Depression   . GERD (gastroesophageal reflux disease)   . Hypercholesteremia   . PONV (postoperative nausea and vomiting)   . Postmenopausal   . Wears glasses     Past Surgical History:  Procedure Laterality Date  . ABDOMINAL HYSTERECTOMY  2005   did not remove ovaries  . CESAREAN SECTION  2000  . COLONOSCOPY    . FOOT ARTHRODESIS  1995   right  . KNEE ARTHROSCOPY WITH MEDIAL MENISECTOMY Left 05/17/2014   Procedure: LEFT ARTHROSCOPY KNEE WITH PARTIAL MEDIAL MENISECTOMY, CHONDROPLASTY OF LATERAL FEMORAL CONDYLE;  Surgeon: Alta Corning, MD;  Location: [facility];  Service: Orthopedics;  Laterality: Left;  . PARTIAL KNEE ARTHROPLASTY Left 01/21/2017   Procedure: UNICOMPARTMENTAL LEFT KNEE;  Surgeon: [name], MD;  Location[facility];  Service: Orthopedics;  Laterality: Left;    There were no vitals filed for this visit.      Subjective Assessment - 01/28/17 0805    Subjective Pt reporting terrible pain at night when first trying to get up out of bed. Gradually improves once she is able to walk around some.   Patient Stated Goals "to get moving like normal"   Currently in Pain? Yes   Pain Score 4    Pain Location Knee   Pain Orientation Left   Pain Descriptors / Indicators Aching   Pain Type Surgical pain   Pain Onset In the past 7 days   Pain Frequency Constant   Aggravating Factors  initiation of movement OOB   Pain Relieving Factors pain meds, ice, elevation   Effect of Pain on Daily Activities transfers and walking            [facility] PT Assessment - 01/28/17 0802      ROM / Strength   AROM / PROM / Strength Strength     AROM   Left Knee Extension 4   Left Knee Flexion 71     Strength   Strength Assessment Site Hip;Knee   Right/Left Hip Right;Left   Right Hip Flexion 4+/5   Right Hip Extension 4+/5   Right Hip ABduction 5/5   Right Hip ADduction 4+/5   Left Hip Flexion 2-/5   Left Hip Extension 3+/5   Left Hip ABduction 4-/5   Left Hip ADduction 4-/5   Right/Left Knee Right;Left   Right Knee Flexion 5/5   Right Knee Extension 5/5   Left Knee Flexion 2-/5   Left Knee Extension 2-/5                     [facility] Adult PT Treatment/Exercise - 01/28/17 0802      Knee/Hip Exercises: Aerobic   Nustep lvl  5 x 6'     Knee/Hip Exercises: Seated   Long Arc Quad Limitations unable   Heel Slides Left;15 reps   Heel Slides Limitations foot on small ball   Marching Limitations unable     Knee/Hip Exercises: Supine   Quad Sets Left;15 reps  5" hold    Short Arc Quad Sets Left;AAROM;15 reps   Short Arc Quad Sets Limitations AAROM of PT to acheive full ROM   Heel Slides Left;AAROM;15 reps   Heel Slides Limitations with strap   Straight Leg Raises Left;AAROM;10 reps   Straight Leg Raises Limitations PT assist   Knee Flexion Left;AAROM;15 reps   Knee Flexion Limitations heels on peanut ball - AAROM of PT to encourage increased flexion     Modalities   Modalities Vasopneumatic     Vasopneumatic   Number Minutes Vasopneumatic  15 minutes   Vasopnuematic Location  Knee   Vasopneumatic Pressure High   Vasopneumatic Temperature  lowest     Manual Therapy   Manual Therapy Joint mobilization    Joint Mobilization L patellar mobs - med/lat, poor tolerance for sup/inf due to ttp over distal quads                     PT Long Term Goals - 01/26/17 1454      PT LONG TERM GOAL #1   Title Independent with ongoing HEP    Status On-going     PT LONG TERM GOAL #2   Title Pt will ambuate with even stride length and weight shift for normal gait pattern with or w/o least restrictive assistive device   Status On-going     PT LONG TERM GOAL #3   Title L knee AROM >/= 3-120 for normal gait mechnics and stair negotiation   Status On-going     PT LONG TERM GOAL #4   Title L hip and knee strength at least 4/5 or greater for improved gait stability and activity tolerance   Status On-going     PT LONG TERM GOAL #5   Title Pt will be able to complete household chores +/- job tasks without limitation due to L knee pain or LOM   Status On-going               Plan - 01/28/17 0809    Clinical Impression Statement Pt noting increased soreness follwoing exercises and increased pain with initiation of mobility after period of inactivity, esp upom rising during the night, but otherwise reports pain improving. Continued mininal quad activity creating difficulty with SAQ and unable to complete hip felxion, SLR or LAQ w/o assist. If this persists as of next visit, may consider NMES to promote increased quad activation.   Rehab Potential Good   PT Treatment/Interventions Patient/family education;ADLs/Self Care Home Management;Therapeutic exercise;Therapeutic activities;Functional mobility training;Gait training;Stair training;Neuromuscular re-education;Balance training;Manual techniques;Scar mobilization;Passive range of motion;Taping;Dry needling;Cryotherapy;Vasopneumatic Device;Electrical Stimulation;Iontophoresis 4mg /ml Dexamethasone   PT Next Visit Plan Add SLR as pt. is able; L knee P/AA/AROM; L LE strengthening as tolerated; gait training with RW to normalize gait pattern;  modalities PRN for pain   Consulted and Agree with Plan of Care Patient      Patient will benefit from skilled therapeutic intervention in order to improve the following deficits and impairments:  Pain, Decreased range of motion, Decreased strength, Impaired flexibility, Increased muscle spasms, Increased edema, Decreased scar mobility, Decreased activity tolerance, Abnormal gait, Difficulty walking, Decreased mobility, Decreased balance, Decreased endurance  Visit Diagnosis: Acute pain of  left knee  Stiffness of left knee, not elsewhere classified  Muscle weakness (generalized)  Other abnormalities of gait and mobility  Difficulty in walking, not elsewhere classified     Problem List Patient Active Problem List   Diagnosis Date Noted  . OA (osteoarthritis) of knee 01/21/2017  . Left knee pain 04/19/2014  . BRONCHITIS, ACUTE 07/21/2010    Percival Spanish, PT, MPT  01/28/2017, 9:17 AM  [facility] [address]  [place], [place], [address] Phone: [phone]   Fax:  [phone]  Name: Joanna Willis MRN: [id number] Date of Birth: 07/26/1955

## 2017-02-02 ENCOUNTER — Ambulatory Visit: Payer: 59 | Admitting: Physical Therapy

## 2017-02-02 DIAGNOSIS — M25562 Pain in left knee: Secondary | ICD-10-CM

## 2017-02-02 DIAGNOSIS — R262 Difficulty in walking, not elsewhere classified: Secondary | ICD-10-CM | POA: Diagnosis not present

## 2017-02-02 DIAGNOSIS — M6281 Muscle weakness (generalized): Secondary | ICD-10-CM

## 2017-02-02 DIAGNOSIS — M25662 Stiffness of left knee, not elsewhere classified: Secondary | ICD-10-CM | POA: Diagnosis not present

## 2017-02-02 DIAGNOSIS — R2689 Other abnormalities of gait and mobility: Secondary | ICD-10-CM

## 2017-02-02 MED FILL — ESTRADIOL 0.05 MG PATCH: 0.05 | 84 days supply | Qty: 24 | Fill #0 | Status: TO

## 2017-02-02 NOTE — Therapy (Signed)
[facility] [address]  [place], [place], [address] Phone: [phone]   Fax:  [phone]  Physical Therapy Treatment  Patient Details  Name: Joanna Willis MRN: [id number] Date of Birth: 10/02/1955 Referring Provider: [name], MD  Encounter Date: 02/02/2017      PT End of Session - 02/02/17 0933    Visit Number 4   Number of Visits 12   Date for PT Re-Evaluation 02/20/17   Authorization Type [facility]   PT Start Time 0933   PT Stop Time 1029   PT Time Calculation (min) 56 min   Activity Tolerance Patient tolerated treatment well   Behavior During Therapy [facility] for tasks assessed/performed      Past Medical History:  Diagnosis Date  . Depression   . GERD (gastroesophageal reflux disease)   . Hypercholesteremia   . PONV (postoperative nausea and vomiting)   . Postmenopausal   . Wears glasses     Past Surgical History:  Procedure Laterality Date  . ABDOMINAL HYSTERECTOMY  2005   did not remove ovaries  . CESAREAN SECTION  2000  . COLONOSCOPY    . FOOT ARTHRODESIS  1995   right  . KNEE ARTHROSCOPY WITH MEDIAL MENISECTOMY Left 05/17/2014   Procedure: LEFT ARTHROSCOPY KNEE WITH PARTIAL MEDIAL MENISECTOMY, CHONDROPLASTY OF LATERAL FEMORAL CONDYLE;  Surgeon: Alta Corning, MD;  Location: [facility];  Service: Orthopedics;  Laterality: Left;  . PARTIAL KNEE ARTHROPLASTY Left 01/21/2017   Procedure: UNICOMPARTMENTAL LEFT KNEE;  Surgeon: [name], MD;  Location[facility];  Service: Orthopedics;  Laterality: Left;    There were no vitals filed for this visit.      Subjective Assessment - 02/02/17 0938    Subjective Pt feeling much better today - swelling improved and less pain, needing less pain meds. Also feels like quads are working better.   Pertinent History L unicompartmental knee replacement 01/21/17   Patient Stated Goals "to get moving like normal"   Currently in Pain? Yes   Pain  Score 2    Pain Location Knee   Pain Orientation Left   Pain Descriptors / Indicators Aching   Pain Type Surgical pain   Pain Onset 1 to 4 weeks ago   Pain Frequency Constant                         [facility] Adult PT Treatment/Exercise - 02/02/17 0933      Ambulation/Gait   Ambulation/Gait Assistance 5: Supervision   Ambulation/Gait Assistance Details Cues provided for proper placement and sequencing with SPC.   Ambulation Distance (Feet) 200 Feet   Assistive device Straight cane   Gait Pattern Step-through pattern;Decreased hip/knee flexion - left;Decreased stance time - left;Decreased weight shift to left   Ambulation Surface Level;Indoor     Knee/Hip Exercises: Aerobic   Recumbent Bike rocking fwd/back for increased flexion ROM x6'     Knee/Hip Exercises: Standing   Heel Raises Both;20 reps;3 seconds   Heel Raises Limitations UE support on counter   Hip Flexion Both;10 reps;Knee bent   Hip Flexion Limitations marching with UE support on counter   Terminal Knee Extension Left;15 reps   Terminal Knee Extension Limitations 5" hold with small ball on wall   Functional Squat 10 reps;3 seconds   Functional Squat Limitations counter squat     Knee/Hip Exercises: Seated   Long Arc Quad Left;10 reps   Long Arc  Quad Limitations fatigues quickly with effort & lacking full knee extension     Knee/Hip Exercises: Supine   Straight Leg Raises Left;AAROM;10 reps   Straight Leg Raises Limitations PT assist   Knee Flexion Left;AAROM;15 reps  2 sets   Knee Flexion Limitations heels on peanut ball - AAROM of PT to encourage increased flexion on 2nd set     Modalities   Modalities Vasopneumatic     Vasopneumatic   Number Minutes Vasopneumatic  15 minutes   Vasopnuematic Location  Knee   Vasopneumatic Pressure High   Vasopneumatic Temperature  lowest     Manual Therapy   Manual Therapy Joint mobilization   Joint Mobilization L patellar mobs - med/lat & sup/inf                 PT Education - 02/02/17 1014    Education provided Yes   Education Details Gait training with SPC; HEP update - standing exercises   Person(s) Educated Patient   Methods Explanation;Demonstration   Comprehension Verbalized understanding             PT Long Term Goals - 01/26/17 1454      PT LONG TERM GOAL #1   Title Independent with ongoing HEP    Status On-going     PT LONG TERM GOAL #2   Title Pt will ambuate with even stride length and weight shift for normal gait pattern with or w/o least restrictive assistive device   Status On-going     PT LONG TERM GOAL #3   Title L knee AROM >/= 3-120 for normal gait mechnics and stair negotiation   Status On-going     PT LONG TERM GOAL #4   Title L hip and knee strength at least 4/5 or greater for improved gait stability and activity tolerance   Status On-going     PT LONG TERM GOAL #5   Title Pt will be able to complete household chores +/- job tasks without limitation due to L knee pain or LOM   Status On-going               Plan - 02/02/17 0940    Clinical Impression Statement Pt reporting much better today, with lessening pain and swelling as well as improving quad activation. Able to actively initiate quad activation with LAQ but unable to achieve full extension. Still requiring AAROM for SLR. Pt reporting she is starting to walk around home w/o the RW, therefore provided gait training in proper use of SPC for within the home. Pt instructed to continue to use RW for community access at this point.   Rehab Potential Good   PT Treatment/Interventions Patient/family education;ADLs/Self Care Home Management;Therapeutic exercise;Therapeutic activities;Functional mobility training;Gait training;Stair training;Neuromuscular re-education;Balance training;Manual techniques;Scar mobilization;Passive range of motion;Taping;Dry needling;Cryotherapy;Vasopneumatic Device;Electrical Stimulation;Iontophoresis  4mg /ml Dexamethasone   PT Next Visit Plan MD progress note (appt 8/16); add SLR to HEP as pt is able; L knee P/AA/AROM; L LE strengthening as tolerated; gait training with SPC to normalize gait pattern; modalities PRN for pain   Consulted and Agree with Plan of Care Patient      Patient will benefit from skilled therapeutic intervention in order to improve the following deficits and impairments:  Pain, Decreased range of motion, Decreased strength, Impaired flexibility, Increased muscle spasms, Increased edema, Decreased scar mobility, Decreased activity tolerance, Abnormal gait, Difficulty walking, Decreased mobility, Decreased balance, Decreased endurance  Visit Diagnosis: Acute pain of left knee  Stiffness of left knee, not elsewhere classified  Muscle weakness (generalized)  Other abnormalities of gait and mobility  Difficulty in walking, not elsewhere classified     Problem List Patient Active Problem List   Diagnosis Date Noted  . OA (osteoarthritis) of knee 01/21/2017  . Left knee pain 04/19/2014  . BRONCHITIS, ACUTE 07/21/2010    Percival Spanish, PT, MPT 02/02/2017, 11:57 AM  [facility] [address]  [place], [place], [address] Phone: [phone]   Fax:  [phone]  Name: YVANA SAMONTE MRN: [id number] Date of Birth: [birth year]

## 2017-02-04 ENCOUNTER — Ambulatory Visit: Payer: 59 | Admitting: Physical Therapy

## 2017-02-04 DIAGNOSIS — M6281 Muscle weakness (generalized): Secondary | ICD-10-CM

## 2017-02-04 DIAGNOSIS — M25562 Pain in left knee: Secondary | ICD-10-CM | POA: Diagnosis not present

## 2017-02-04 DIAGNOSIS — R262 Difficulty in walking, not elsewhere classified: Secondary | ICD-10-CM

## 2017-02-04 DIAGNOSIS — R2689 Other abnormalities of gait and mobility: Secondary | ICD-10-CM | POA: Diagnosis not present

## 2017-02-04 DIAGNOSIS — M25662 Stiffness of left knee, not elsewhere classified: Secondary | ICD-10-CM | POA: Diagnosis not present

## 2017-02-04 NOTE — Therapy (Signed)
[facility] [address]  [place], [place], [address] Phone: [phone]   Fax:  [phone]  Physical Therapy Treatment  Patient Details  Name: Joanna Willis MRN: [id number] Date of Birth: Jan 07, 1956 Referring Provider: [name], MD  Encounter Date: 02/04/2017      PT End of Session - 02/04/17 0936    Visit Number 5   Number of Visits 12   Date for PT Re-Evaluation 02/20/17   Authorization Type [facility]   PT Start Time 0936   PT Stop Time 1032   PT Time Calculation (min) 56 min   Activity Tolerance Patient tolerated treatment well   Behavior During Therapy [facility] for tasks assessed/performed      Past Medical History:  Diagnosis Date  . Depression   . GERD (gastroesophageal reflux disease)   . Hypercholesteremia   . PONV (postoperative nausea and vomiting)   . Postmenopausal   . Wears glasses     Past Surgical History:  Procedure Laterality Date  . ABDOMINAL HYSTERECTOMY  2005   did not remove ovaries  . CESAREAN SECTION  2000  . COLONOSCOPY    . FOOT ARTHRODESIS  1995   right  . KNEE ARTHROSCOPY WITH MEDIAL MENISECTOMY Left 05/17/2014   Procedure: LEFT ARTHROSCOPY KNEE WITH PARTIAL MEDIAL MENISECTOMY, CHONDROPLASTY OF LATERAL FEMORAL CONDYLE;  Surgeon: Joanna Corning, MD;  Location: [place];  Service: Orthopedics;  Laterality: Left;  . PARTIAL KNEE ARTHROPLASTY Left 01/21/2017   Procedure: UNICOMPARTMENTAL LEFT KNEE;  Surgeon: Joanna Arabian, MD;  Location: [facility];  Service: Orthopedics;  Laterality: Left;    There were no vitals filed for this visit.      Subjective Assessment - 02/04/17 0939    Subjective Pt arrived to PT using cane today and reports she drove herself this morning. States feeling better every day and reports improving quad control. No longer having severe pain/ache with getting OOB at night.   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 5 minutes   Patient Stated Goals "to get moving like normal"   Currently in Pain? Yes   Pain Score 2    Pain Location Knee   Pain Orientation Left   Pain Descriptors / Indicators Aching   Pain Type Surgical pain   Pain Onset 1 to 4 weeks ago   Pain Frequency Constant   Aggravating Factors  increased achiness in evenings following PT   Pain Relieving Factors OTC pain meds during day, prescription pain meds at night, ice & elevation   Effect of Pain on Daily Activities limited standing tolerance            [facility] PT Assessment - 02/04/17 0936      Assessment   Medical Diagnosis L unicompartmental knee replacement   Referring Provider Joanna Arabian, MD   Onset Date/Surgical Date 01/21/17   Next MD Visit 02/05/17     AROM   Left Knee Extension 3  supported; 6 dg with LAQ   Left Knee Flexion 88     PROM   Left Knee Flexion 92                     [facility] Adult PT Treatment/Exercise - 02/04/17 0936      Knee/Hip Exercises: Stretches   Knee: Self-Stretch to increase Flexion Left;10 seconds;3 reps   Knee: Self-Stretch Limitations step stretch   Gastroc  Stretch Left;30 seconds;2 reps   Gastroc Stretch Limitations standing at wall   Soleus Stretch Left;30 seconds;2 reps   Soleus Stretch Limitations standing at wall   Other Knee/Hip Stretches Prostretch heelcord stretch 3x30"     Knee/Hip Exercises: Aerobic   Recumbent Bike rocking fwd/back for increased flexion ROM x6'     Knee/Hip Exercises: Seated   Long Arc Quad Left;15 reps   Heel Slides Left;AAROM;10 reps   Heel Slides Limitations overpressure with R LE     Knee/Hip Exercises: Supine   Heel Slides Left;AAROM;15 reps   Heel Slides Limitations with strap   Straight Leg Raises Left;AROM;15 reps   Straight Leg Raises Limitations 5-6 dg quad lag   Knee Flexion Left;AAROM;15 reps  2 sets   Knee Flexion Limitations heels on peanut ball - AAROM of PT  to encourage increased flexion on 2nd set     Modalities   Modalities Vasopneumatic     Vasopneumatic   Number Minutes Vasopneumatic  15 minutes   Vasopnuematic Location  Knee   Vasopneumatic Pressure High   Vasopneumatic Temperature  lowest     Manual Therapy   Manual Therapy Joint mobilization   Joint Mobilization L patellar mobs - med/lat & sup/inf; L knee flexion/extension mobs                     PT Long Term Goals - 01/26/17 1454      PT LONG TERM GOAL #1   Title Independent with ongoing HEP    Status On-going     PT LONG TERM GOAL #2   Title Pt will ambuate with even stride length and weight shift for normal gait pattern with or w/o least restrictive assistive device   Status On-going     PT LONG TERM GOAL #3   Title L knee AROM >/= 3-120 for normal gait mechnics and stair negotiation   Status On-going     PT LONG TERM GOAL #4   Title L hip and knee strength at least 4/5 or greater for improved gait stability and activity tolerance   Status On-going     PT LONG TERM GOAL #5   Title Pt will be able to complete household chores +/- job tasks without limitation due to L knee pain or LOM   Status On-going               Plan - 02/04/17 0944    Clinical Impression Statement Joanna Willis had a slow start with PT due high pain levels and increased swelling but now progressing well. Has been able to transition from RW to [facility] over past few days. L quads slow to wake up following surgery but now able to actively initiate SAQ/LAQ and SLR, although slight quad lag still present with SLR. L knee AROM currently 4-88 dg with PROM flexion up to 92 dg. Pt demostrates good potential to benefit from further PT to restore functional ROM and strength in L knee.   Rehab Potential Good   PT Treatment/Interventions Patient/family education;ADLs/Self Care Home Management;Therapeutic exercise;Therapeutic activities;Functional mobility training;Gait training;Stair  training;Neuromuscular re-education;Balance training;Manual techniques;Scar mobilization;Passive range of motion;Taping;Dry needling;Cryotherapy;Vasopneumatic Device;Electrical Stimulation;Iontophoresis 4mg /ml Dexamethasone   PT Next Visit Plan add SLR to HEP as pt is able; L knee P/AA/AROM; L LE strengthening as tolerated; gait training with SPC to normalize gait pattern; modalities PRN for pain   Consulted and Agree with Plan of Care Patient      Patient will benefit from skilled therapeutic intervention in  order to improve the following deficits and impairments:  Pain, Decreased range of motion, Decreased strength, Impaired flexibility, Increased muscle spasms, Increased edema, Decreased scar mobility, Decreased activity tolerance, Abnormal gait, Difficulty walking, Decreased mobility, Decreased balance, Decreased endurance  Visit Diagnosis: Acute pain of left knee  Stiffness of left knee, not elsewhere classified  Muscle weakness (generalized)  Other abnormalities of gait and mobility  Difficulty in walking, not elsewhere classified     Problem List Patient Active Problem List   Diagnosis Date Noted  . OA (osteoarthritis) of knee 01/21/2017  . Left knee pain 04/19/2014  . BRONCHITIS, ACUTE 07/21/2010    Percival Spanish, PT, MPT 02/04/2017, 10:18 AM  [facility] [address]  [place], [place], [address] Phone: [phone]   Fax:  [phone]  Name: Joanna Willis MRN: [id number] Date of Birth: 03/07/1956

## 2017-02-05 MED FILL — CYCLOBENZAPRINE 10 MG TAB: 10 | 20 days supply | Qty: 60 | Fill #0

## 2017-02-06 ENCOUNTER — Ambulatory Visit: Payer: 59

## 2017-02-06 DIAGNOSIS — M25662 Stiffness of left knee, not elsewhere classified: Secondary | ICD-10-CM | POA: Diagnosis not present

## 2017-02-06 DIAGNOSIS — R262 Difficulty in walking, not elsewhere classified: Secondary | ICD-10-CM | POA: Diagnosis not present

## 2017-02-06 DIAGNOSIS — M25562 Pain in left knee: Secondary | ICD-10-CM | POA: Diagnosis not present

## 2017-02-06 DIAGNOSIS — M6281 Muscle weakness (generalized): Secondary | ICD-10-CM

## 2017-02-06 DIAGNOSIS — R2689 Other abnormalities of gait and mobility: Secondary | ICD-10-CM | POA: Diagnosis not present

## 2017-02-06 NOTE — Therapy (Addendum)
[facility] [address]  [place], [place], [address] Phone: [phone]   Fax:  [phone]  Physical Therapy Treatment  Patient Details  Name: Joanna Willis MRN: [id number] Date of Birth: 14-Sep-1955 Referring Provider: [name], MD  Encounter Date: 02/06/2017      PT End of Session - 02/06/17 0950    Visit Number 6   Number of Visits 12   Date for PT Re-Evaluation 02/20/17   Authorization Type [facility]   PT Start Time 0939   PT Stop Time 1030   PT Time Calculation (min) 51 min   Activity Tolerance Patient tolerated treatment well   Behavior During Therapy [facility] for tasks assessed/performed      Past Medical History:  Diagnosis Date  . Depression   . GERD (gastroesophageal reflux disease)   . Hypercholesteremia   . PONV (postoperative nausea and vomiting)   . Postmenopausal   . Wears glasses     Past Surgical History:  Procedure Laterality Date  . ABDOMINAL HYSTERECTOMY  2005   did not remove ovaries  . CESAREAN SECTION  2000  . COLONOSCOPY    . FOOT ARTHRODESIS  1995   right  . KNEE ARTHROSCOPY WITH MEDIAL MENISECTOMY Left 05/17/2014   Procedure: LEFT ARTHROSCOPY KNEE WITH PARTIAL MEDIAL MENISECTOMY, CHONDROPLASTY OF LATERAL FEMORAL CONDYLE;  Surgeon: Alta Corning, MD;  Location: [place];  Service: Orthopedics;  Laterality: Left;  . PARTIAL KNEE ARTHROPLASTY Left 01/21/2017   Procedure: UNICOMPARTMENTAL LEFT KNEE;  Surgeon: [name], MD;  [facility];  Service: Orthopedics;  Laterality: Left;    There were no vitals filed for this visit.      Subjective Assessment - 02/06/17 0944    Subjective Saw PA yesterday.  PA happy with progress and removed steri strips.  Notes some knee soreness following last treatment which was improved today.     Patient Stated Goals "to get moving like normal"   Currently in Pain? Yes   Pain Score 3    Pain Location Knee   Pain  Orientation Left   Pain Descriptors / Indicators Aching   Pain Type Surgical pain   Pain Onset 1 to 4 weeks ago   Pain Frequency Constant   Multiple Pain Sites No                         [facility] Adult PT Treatment/Exercise - 02/06/17 1000      Knee/Hip Exercises: Stretches   Passive Hamstring Stretch Left;1 rep;30 seconds   Passive Hamstring Stretch Limitations strap    Gastroc Stretch Left;30 seconds;1 rep   Gastroc Stretch Limitations supine with strap     Knee/Hip Exercises: Aerobic   Recumbent Bike rocking fwd/back for increased flexion ROM x6'     Knee/Hip Exercises: Standing   Terminal Knee Extension Left;15 reps   Terminal Knee Extension Limitations 5" hold with small ball on wall   Functional Squat 15 reps;3 seconds   Functional Squat Limitations counter squat     Knee/Hip Exercises: Supine   Quad Sets Left;15 reps   Quad Sets Limitations 5" hold    Short Arc Quad Sets Left;10 reps   Short Arc Quad Sets Limitations 5" hold; 1#    Straight Leg Raises Left;2 sets;10 reps   Straight Leg Raises Limitations quad set prior to each rep   Knee Flexion Left;AAROM;15 reps   Knee Flexion Limitations heels on peanut  p-ball and strap asist stretch at end range flexion 5"     Vasopneumatic   Number Minutes Vasopneumatic  15 minutes   Vasopnuematic Location  Knee  L   Vasopneumatic Pressure High   Vasopneumatic Temperature  lowest                PT Education - 02/06/17 1130    Education provided Yes   Education Details SLR   Person(s) Educated Patient   Methods Explanation;Demonstration;Verbal cues;Handout   Comprehension Verbalized understanding;Returned demonstration;Verbal cues required;Need further instruction             PT Long Term Goals - 01/26/17 1454      PT LONG TERM GOAL #1   Title Independent with ongoing HEP    Status On-going     PT LONG TERM GOAL #2   Title Pt will ambuate with even stride length and weight shift for  normal gait pattern with or w/o least restrictive assistive device   Status On-going     PT LONG TERM GOAL #3   Title L knee AROM >/= 3-120 for normal gait mechnics and stair negotiation   Status On-going     PT LONG TERM GOAL #4   Title L hip and knee strength at least 4/5 or greater for improved gait stability and activity tolerance   Status On-going     PT LONG TERM GOAL #5   Title Pt will be able to complete household chores +/- job tasks without limitation due to L knee pain or LOM   Status On-going               Plan - 02/06/17 0951    Clinical Impression Statement Pt. arrived late to therapy today thus treatment time limited.  Saw PA for f/u yesterday with PA pleased with progress and removed Steri Strips.  Pt. noting improvement in pain levels over last few days and feels she is able to control L leg better.  Marked improvement in L quad recruitment with SLR/TKE activities today.  SLR added to HEP.  Pt. tolerated progression in supine and standing strengthening activities well today without issue.  Ice/compression applied to L knee to end treatment to decrease post-exercise swelling and pain.     PT Treatment/Interventions Patient/family education;ADLs/Self Care Home Management;Therapeutic exercise;Therapeutic activities;Functional mobility training;Gait training;Stair training;Neuromuscular re-education;Balance training;Manual techniques;Scar mobilization;Passive range of motion;Taping;Dry needling;Cryotherapy;Vasopneumatic Device;Electrical Stimulation;Iontophoresis 4mg /ml Dexamethasone   PT Next Visit Plan L knee P/AA/AROM; L LE strengthening as tolerated; gait training with SPC to normalize gait pattern; modalities PRN for pain      Patient will benefit from skilled therapeutic intervention in order to improve the following deficits and impairments:  Pain, Decreased range of motion, Decreased strength, Impaired flexibility, Increased muscle spasms, Increased edema,  Decreased scar mobility, Decreased activity tolerance, Abnormal gait, Difficulty walking, Decreased mobility, Decreased balance, Decreased endurance  Visit Diagnosis: Acute pain of left knee  Stiffness of left knee, not elsewhere classified  Muscle weakness (generalized)  Other abnormalities of gait and mobility  Difficulty in walking, not elsewhere classified     Problem List Patient Active Problem List   Diagnosis Date Noted  . OA (osteoarthritis) of knee 01/21/2017  . Left knee pain 04/19/2014  . BRONCHITIS, ACUTE 07/21/2010    Bess Harvest, PTA 02/06/17 11:31 AM   [facility] [address]  [place], [place], [address] Phone: [phone]   Fax:  [phone]  Name: Joanna Willis  MRN: [id number] Date of Birth: 1956/02/27

## 2017-02-09 ENCOUNTER — Ambulatory Visit: Payer: 59

## 2017-02-09 DIAGNOSIS — M6281 Muscle weakness (generalized): Secondary | ICD-10-CM

## 2017-02-09 DIAGNOSIS — M25662 Stiffness of left knee, not elsewhere classified: Secondary | ICD-10-CM | POA: Diagnosis not present

## 2017-02-09 DIAGNOSIS — R262 Difficulty in walking, not elsewhere classified: Secondary | ICD-10-CM | POA: Diagnosis not present

## 2017-02-09 DIAGNOSIS — M25562 Pain in left knee: Secondary | ICD-10-CM

## 2017-02-09 DIAGNOSIS — R2689 Other abnormalities of gait and mobility: Secondary | ICD-10-CM | POA: Diagnosis not present

## 2017-02-09 NOTE — Therapy (Signed)
Dictation #1 [id number]  [id number] Dictation #2 [id number]  [id number]  [facility] [address]  [address] [place], [place], [address] Phone: [phone]   Fax:  [phone]  Physical Therapy Treatment  Patient Details  Name: Joanna Willis MRN: [id number] Date of Birth: [birth year] Referring Provider: [name], MD  Encounter Date: 02/09/2017      PT End of Session - 02/09/17 1409    Visit Number 7   Number of Visits 12   Date for PT Re-Evaluation 02/20/17   Authorization Type [facility]   PT Start Time 1402   PT Stop Time 1500   PT Time Calculation (min) 58 min   Activity Tolerance Patient tolerated treatment well   Behavior During Therapy [facility] for tasks assessed/performed      Past Medical History:  Diagnosis Date  . Depression   . GERD (gastroesophageal reflux disease)   . Hypercholesteremia   . PONV (postoperative nausea and vomiting)   . Postmenopausal   . Wears glasses     Past Surgical History:  Procedure Laterality Date  . ABDOMINAL HYSTERECTOMY  2005   did not remove ovaries  . CESAREAN SECTION  2000  . COLONOSCOPY    . FOOT ARTHRODESIS  1995   right  . KNEE ARTHROSCOPY WITH MEDIAL MENISECTOMY Left 05/17/2014   Procedure: LEFT ARTHROSCOPY KNEE WITH PARTIAL MEDIAL MENISECTOMY, CHONDROPLASTY OF LATERAL FEMORAL CONDYLE;  Surgeon: Alta Corning, MD;  Location: [place];  Service: Orthopedics;  Laterality: Left;  . PARTIAL KNEE ARTHROPLASTY Left 01/21/2017   Procedure: UNICOMPARTMENTAL LEFT KNEE;  Surgeon: [name], MD;  [facility];  Service: Orthopedics;  Laterality: Left;    There were no vitals filed for this visit.      Subjective Assessment - 02/09/17 1408    Subjective Pt. noting getting up less at night.  Notes swelling improved at home.     Patient Stated Goals "to get moving like normal"   Currently in Pain? No/denies   Pain Score 0-No pain   Multiple Pain Sites No            [facility] PT Assessment - 02/09/17 1421      AROM   Left Knee Flexion 94     PROM   Left Knee Flexion 101                     [facility] Adult PT Treatment/Exercise - 02/09/17 1412      Knee/Hip Exercises: Aerobic   Recumbent Bike rocking fwd/back for increased flexion ROM x6'     Knee/Hip Exercises: Standing   Hip Flexion Both;10 reps;Knee straight   Hip Flexion Limitations 3#; straight leg at counter   Hip Abduction Right;Left;10 reps;Knee straight   Abduction Limitations 3#; at counter   Functional Squat 15 reps;3 seconds   Functional Squat Limitations counter squat   Other Standing Knee Exercises Alteranting standing lunge onto BOSU ball (up) x 10 reps each side; 2 ski poles      Knee/Hip Exercises: Supine   Bridges with Ball Squeeze 15 reps;Strengthening;Both  with pillow squeeze    Straight Leg Raises 1 set;15 reps   Straight Leg Raises Limitations 2#; quad set prior to each rep   Knee Flexion Left;AAROM;15 reps   Knee Flexion Limitations L heel on orange p-ball 5" x 10 reps     Vasopneumatic   Number Minutes Vasopneumatic  15 minutes   Vasopnuematic Location  Knee  Vasopneumatic Pressure High   Vasopneumatic Temperature  lowest     Manual Therapy   Manual Therapy Joint mobilization;Passive ROM   Joint Mobilization L patellar mobs - med/lat & sup/inf; L knee flexion/extension mobs   Passive ROM L knee PROM flexion/ext.                      PT Long Term Goals - 01/26/17 1454      PT LONG TERM GOAL #1   Title Independent with ongoing HEP    Status On-going     PT LONG TERM GOAL #2   Title Pt will ambuate with even stride length and weight shift for normal gait pattern with or w/o least restrictive assistive device   Status On-going     PT LONG TERM GOAL #3   Title L knee AROM >/= 3-120 for normal gait mechnics and stair negotiation   Status On-going     PT LONG TERM GOAL #4   Title L hip and knee  strength at least 4/5 or greater for improved gait stability and activity tolerance   Status On-going     PT LONG TERM GOAL #5   Title Pt will be able to complete household chores +/- job tasks without limitation due to L knee pain or LOM   Status On-going               Plan - 02/09/17 1429    Clinical Impression Statement Joanna Willis doing well today noting she feels swelling has improved over weekend and functional status of knee has improved.  Able to demo L knee PROM 101 dg flexion and AROM flexion of 94 dg today.  Seems to be progressing well with all strengthening activities and SLR progressed to 2# with good control.  Pt. requesting ice/compression to reduce post-exercise swelling pain thus performed to end treatment.   PT Treatment/Interventions Patient/family education;ADLs/Self Care Home Management;Therapeutic exercise;Therapeutic activities;Functional mobility training;Gait training;Stair training;Neuromuscular re-education;Balance training;Manual techniques;Scar mobilization;Passive range of motion;Taping;Dry needling;Cryotherapy;Vasopneumatic Device;Electrical Stimulation;Iontophoresis 4mg /ml Dexamethasone   PT Next Visit Plan L knee P/AA/AROM; L LE strengthening as tolerated; gait training with SPC to normalize gait pattern; modalities PRN for pain      Patient will benefit from skilled therapeutic intervention in order to improve the following deficits and impairments:  Pain, Decreased range of motion, Decreased strength, Impaired flexibility, Increased muscle spasms, Increased edema, Decreased scar mobility, Decreased activity tolerance, Abnormal gait, Difficulty walking, Decreased mobility, Decreased balance, Decreased endurance  Visit Diagnosis: Acute pain of left knee  Stiffness of left knee, not elsewhere classified  Muscle weakness (generalized)  Other abnormalities of gait and mobility  Difficulty in walking, not elsewhere classified     Problem List Patient  Active Problem List   Diagnosis Date Noted  . OA (osteoarthritis) of knee 01/21/2017  . Left knee pain 04/19/2014  . BRONCHITIS, ACUTE 07/21/2010    Bess Harvest, PTA 02/09/17 7:06 PM  [facility] [address]  [place], [place], [address] Phone: [phone]   Fax:  [phone]  Name: Joanna Willis MRN: [id number] Date of Birth: 13-May-1956

## 2017-02-11 ENCOUNTER — Ambulatory Visit: Payer: 59 | Admitting: Physical Therapy

## 2017-02-11 DIAGNOSIS — R262 Difficulty in walking, not elsewhere classified: Secondary | ICD-10-CM | POA: Diagnosis not present

## 2017-02-11 DIAGNOSIS — M25662 Stiffness of left knee, not elsewhere classified: Secondary | ICD-10-CM

## 2017-02-11 DIAGNOSIS — R2689 Other abnormalities of gait and mobility: Secondary | ICD-10-CM

## 2017-02-11 DIAGNOSIS — M25562 Pain in left knee: Secondary | ICD-10-CM | POA: Diagnosis not present

## 2017-02-11 DIAGNOSIS — M6281 Muscle weakness (generalized): Secondary | ICD-10-CM | POA: Diagnosis not present

## 2017-02-11 NOTE — Therapy (Signed)
[facility] [address]  [place], [place], [address] Phone: [phone]   Fax:  [phone]  Physical Therapy Treatment  Patient Details  Name: Joanna Willis MRN: [id number] Date of Birth: Apr 13, 1956 Referring Provider: [name], MD  Encounter Date: 02/11/2017      PT End of Session - 02/11/17 0933    Visit Number 8   Number of Visits 12   Date for PT Re-Evaluation 02/20/17   Authorization Type [facility]   PT Start Time 0933   PT Stop Time 1033   PT Time Calculation (min) 60 min   Activity Tolerance Patient tolerated treatment well   Behavior During Therapy [facility] for tasks assessed/performed      Past Medical History:  Diagnosis Date  . Depression   . GERD (gastroesophageal reflux disease)   . Hypercholesteremia   . PONV (postoperative nausea and vomiting)   . Postmenopausal   . Wears glasses     Past Surgical History:  Procedure Laterality Date  . ABDOMINAL HYSTERECTOMY  2005   did not remove ovaries  . CESAREAN SECTION  2000  . COLONOSCOPY    . FOOT ARTHRODESIS  1995   right  . KNEE ARTHROSCOPY WITH MEDIAL MENISECTOMY Left 05/17/2014   Procedure: LEFT ARTHROSCOPY KNEE WITH PARTIAL MEDIAL MENISECTOMY, CHONDROPLASTY OF LATERAL FEMORAL CONDYLE;  Surgeon: Alta Corning, MD;  Location: [place];  Service: Orthopedics;  Laterality: Left;  . PARTIAL KNEE ARTHROPLASTY Left 01/21/2017   Procedure: UNICOMPARTMENTAL LEFT KNEE;  Surgeon: [name], MD;  Location: [facility];  Service: Orthopedics;  Laterality: Left;    There were no vitals filed for this visit.      Subjective Assessment - 02/11/17 0935    Subjective Pt reporting still be woken up at night by restless leg, but no pain.   Patient Stated Goals "to get moving like normal"   Currently in Pain? No/denies   Pain Score 0-No pain            [facility] PT Assessment - 02/11/17 0933      Assessment   Next MD Visit ~02/26/17                      [facility] Adult PT Treatment/Exercise - 02/11/17 0933      Exercises   Exercises Knee/Hip     Knee/Hip Exercises: Stretches   Other Knee/Hip Stretches Prostretch heelcord stretch 3x30"     Knee/Hip Exercises: Aerobic   Recumbent Bike full revolutions - no resistance x6'     Knee/Hip Exercises: Machines for Strengthening   Cybex Knee Extension B LE 15# x15   Cybex Knee Flexion B LE 20# x15; B con/L ecc 15# x10     Knee/Hip Exercises: Standing   Terminal Knee Extension Left;15 reps   Theraband Level (Terminal Knee Extension) Level 4 (Blue)   Lateral Step Up Left;15 reps;Step Height: 6";Hand Hold: 1   Lateral Step Up Limitations intermittent 1 pole A   Forward Step Up Left;15 reps;Step Height: 6";Hand Hold: 1   Forward Step Up Limitations intermittent 1 pole A   Step Down Left;15 reps;Step Height: 4";Hand Hold: 1   Step Down Limitations eccentric lowering with heel touch   Functional Squat 15 reps;3 seconds;2 sets   Functional Squat Limitations TRX; 2nd set + heel raise upon rising     Modalities   Modalities Vasopneumatic     Vasopneumatic   Number Minutes Vasopneumatic  15 minutes   Vasopnuematic Location  Knee   Vasopneumatic Pressure High   Vasopneumatic Temperature  lowest     Manual Therapy   Manual Therapy Soft tissue mobilization   Soft tissue mobilization L knee incisional scar massage                PT Education - 02/11/17 1010    Education provided Yes   Education Details Incisional scar tissue massage   Person(s) Educated Patient   Methods Explanation;Demonstration   Comprehension Verbalized understanding;Returned demonstration             PT Long Term Goals - 01/26/17 1454      PT LONG TERM GOAL #1   Title Independent with ongoing HEP    Status On-going     PT LONG TERM GOAL #2   Title Pt will ambuate with even stride length and weight shift for normal gait pattern with or w/o least restrictive assistive  device   Status On-going     PT LONG TERM GOAL #3   Title L knee AROM >/= 3-120 for normal gait mechnics and stair negotiation   Status On-going     PT LONG TERM GOAL #4   Title L hip and knee strength at least 4/5 or greater for improved gait stability and activity tolerance   Status On-going     PT LONG TERM GOAL #5   Title Pt will be able to complete household chores +/- job tasks without limitation due to L knee pain or LOM   Status On-going               Plan - 02/11/17 0938    Clinical Impression Statement Pt able to work into full revolutions on bike today, initially with some substitution at hip but able to more fluidly complete revolutions by end of warm-up. Progressed standing exercises and introduced machine knee flexion & extension with some increased fatigue and muscle soreness noted but no lasting increased pain. Incisional scar nearly scab free, therefore introduced scar tissue massage with instructions for completion at home.   Rehab Potential Good   PT Treatment/Interventions Patient/family education;ADLs/Self Care Home Management;Therapeutic exercise;Therapeutic activities;Functional mobility training;Gait training;Stair training;Neuromuscular re-education;Balance training;Manual techniques;Scar mobilization;Passive range of motion;Taping;Dry needling;Cryotherapy;Vasopneumatic Device;Electrical Stimulation;Iontophoresis 4mg /ml Dexamethasone   PT Next Visit Plan L knee P/AA/AROM; L LE strengthening as tolerated; gait training with SPC to normalize gait pattern; modalities PRN for pain   Consulted and Agree with Plan of Care Patient      Patient will benefit from skilled therapeutic intervention in order to improve the following deficits and impairments:  Pain, Decreased range of motion, Decreased strength, Impaired flexibility, Increased muscle spasms, Increased edema, Decreased scar mobility, Decreased activity tolerance, Abnormal gait, Difficulty walking, Decreased  mobility, Decreased balance, Decreased endurance  Visit Diagnosis: Acute pain of left knee  Stiffness of left knee, not elsewhere classified  Muscle weakness (generalized)  Other abnormalities of gait and mobility  Difficulty in walking, not elsewhere classified     Problem List Patient Active Problem List   Diagnosis Date Noted  . OA (osteoarthritis) of knee 01/21/2017  . Left knee pain 04/19/2014  . BRONCHITIS, ACUTE 07/21/2010    Percival Spanish, PT, MPT 02/11/2017, 10:53 AM  [facility] [address]  [place], [place], [address] Phone: [phone]   Fax:  [phone]  Name: LAVERNE HURSEY MRN: [id number] Date of Birth: December 23, 1955

## 2017-02-13 ENCOUNTER — Ambulatory Visit: Payer: 59 | Admitting: Physical Therapy

## 2017-02-13 DIAGNOSIS — R2689 Other abnormalities of gait and mobility: Secondary | ICD-10-CM

## 2017-02-13 DIAGNOSIS — M25562 Pain in left knee: Secondary | ICD-10-CM | POA: Diagnosis not present

## 2017-02-13 DIAGNOSIS — M25662 Stiffness of left knee, not elsewhere classified: Secondary | ICD-10-CM | POA: Diagnosis not present

## 2017-02-13 DIAGNOSIS — R262 Difficulty in walking, not elsewhere classified: Secondary | ICD-10-CM | POA: Diagnosis not present

## 2017-02-13 DIAGNOSIS — M6281 Muscle weakness (generalized): Secondary | ICD-10-CM | POA: Diagnosis not present

## 2017-02-13 NOTE — Therapy (Signed)
[facility] [address]  [place], [place], [address] Phone: [phone]   Fax:  [phone]  Physical Therapy Treatment  Patient Details  Name: Joanna Willis MRN: [id number] Date of Birth: 1955/11/12 Referring Provider: [name], MD  Encounter Date: 02/13/2017      PT End of Session - 02/13/17 0930    Visit Number 9   Number of Visits 12   Date for PT Re-Evaluation 02/20/17   Authorization Type [facility]   PT Start Time 0930   PT Stop Time 1031   PT Time Calculation (min) 61 min   Activity Tolerance Patient tolerated treatment well   Behavior During Therapy [facility] for tasks assessed/performed      Past Medical History:  Diagnosis Date  . Depression   . GERD (gastroesophageal reflux disease)   . Hypercholesteremia   . PONV (postoperative nausea and vomiting)   . Postmenopausal   . Wears glasses     Past Surgical History:  Procedure Laterality Date  . ABDOMINAL HYSTERECTOMY  2005   did not remove ovaries  . CESAREAN SECTION  2000  . COLONOSCOPY    . FOOT ARTHRODESIS  1995   right  . KNEE ARTHROSCOPY WITH MEDIAL MENISECTOMY Left 05/17/2014   Procedure: LEFT ARTHROSCOPY KNEE WITH PARTIAL MEDIAL MENISECTOMY, CHONDROPLASTY OF LATERAL FEMORAL CONDYLE;  Surgeon: Alta Corning, MD;  Location: [place];  Service: Orthopedics;  Laterality: Left;  . PARTIAL KNEE ARTHROPLASTY Left 01/21/2017   Procedure: UNICOMPARTMENTAL LEFT KNEE;  Surgeon: [name], MD;  Location[facility];  Service: Orthopedics;  Laterality: Left;    There were no vitals filed for this visit.      Subjective Assessment - 02/13/17 0932    Subjective Pt reporting difficulty sleeping last night due to knee but was trying to avoid taking oxycodone. States knee feels thick today.   Pertinent History L unicompartmental knee replacement 01/21/17   Patient Stated Goals "to get moving like normal"   Currently in Pain? Yes   Pain  Score 4    Pain Location Knee   Pain Orientation Left   Pain Descriptors / Indicators Aching   Pain Type Surgical pain   Pain Frequency Intermittent   Aggravating Factors  increased pain/achiness following PT sessions   Pain Relieving Factors OTC pain meds, ice & elevation 3-4x/day   Effect of Pain on Daily Activities sleep disruption            [facility] PT Assessment - 02/13/17 0930      Assessment   Next MD Visit 03/03/17     AROM   Left Knee Extension 1  1 dg supported; 3 dg LAQ   Left Knee Flexion 105                     [facility] Adult PT Treatment/Exercise - 02/13/17 0930      Knee/Hip Exercises: Stretches   Knee: Self-Stretch to increase Flexion Left;3 reps;20 seconds   Knee: Self-Stretch Limitations step stretch   ITB Stretch Left;30 seconds;1 rep     Knee/Hip Exercises: Aerobic   Recumbent Bike full revolutions x 1 min; lvl 1.0 x 5'     Knee/Hip Exercises: Machines for Strengthening   Cybex Leg Press B LE 20# 2x15     Knee/Hip Exercises: Standing   Hip Flexion Both;10 reps;Knee straight;Stengthening   Hip Flexion Limitations red TB, 1 pole A   Hip ADduction Both;10 reps;Strengthening  Hip ADduction Limitations red TB, 1 pole A   Hip Abduction Both;10 reps;Stengthening   Abduction Limitations red TB, 1 pole A   Hip Extension Both;10 reps;Knee straight;Stengthening   Extension Limitations red TB, 1 pole A     Knee/Hip Exercises: Supine   Short Arc Quad Sets Left;15 reps;Strengthening   Short Arc Quad Sets Limitations 3#, 5" hold   Bridges with [employer] Both;15 reps;Strengthening  5" hold   Straight Leg Raises Left;15 reps;Strengthening   Straight Leg Raises Limitations 3#, 5" hold     Modalities   Modalities Vasopneumatic     Vasopneumatic   Number Minutes Vasopneumatic  15 minutes   Vasopnuematic Location  Knee   Vasopneumatic Pressure High   Vasopneumatic Temperature  lowest     Manual Therapy   Manual Therapy Joint mobilization    Joint Mobilization L knee flexion/extension mobs                PT Education - 02/13/17 1016    Education provided Yes   Education Details step stretch & ITB stretch   Person(s) Educated Patient   Methods Explanation;Demonstration;Handout   Comprehension Verbalized understanding;Returned demonstration             PT Long Term Goals - 01/26/17 1454      PT LONG TERM GOAL #1   Title Independent with ongoing HEP    Status On-going     PT LONG TERM GOAL #2   Title Pt will ambuate with even stride length and weight shift for normal gait pattern with or w/o least restrictive assistive device   Status On-going     PT LONG TERM GOAL #3   Title L knee AROM >/= 3-120 for normal gait mechnics and stair negotiation   Status On-going     PT LONG TERM GOAL #4   Title L hip and knee strength at least 4/5 or greater for improved gait stability and activity tolerance   Status On-going     PT LONG TERM GOAL #5   Title Pt will be able to complete household chores +/- job tasks without limitation due to L knee pain or LOM   Status On-going               Plan - 02/13/17 0930    Clinical Impression Statement Pt reporting increased "thickness" in L knee today but continues to demonstrate improving L knee ROM with pt now able to consistently complete full revolutions on recumbent bike. Gait improving with pattern normalizing and decreasing need for cane - pt cleared to walk around home w/o AD but encouraged to still take cane when going out.    Rehab Potential Good   PT Treatment/Interventions Patient/family education;ADLs/Self Care Home Management;Therapeutic exercise;Therapeutic activities;Functional mobility training;Gait training;Stair training;Neuromuscular re-education;Balance training;Manual techniques;Scar mobilization;Passive range of motion;Taping;Dry needling;Cryotherapy;Vasopneumatic Device;Electrical Stimulation;Iontophoresis 4mg /ml Dexamethasone   PT Next Visit Plan  L knee P/AA/AROM; L LE strengthening as tolerated; gait training with SPC to normalize gait pattern; modalities PRN for pain   Consulted and Agree with Plan of Care Patient      Patient will benefit from skilled therapeutic intervention in order to improve the following deficits and impairments:  Pain, Decreased range of motion, Decreased strength, Impaired flexibility, Increased muscle spasms, Increased edema, Decreased scar mobility, Decreased activity tolerance, Abnormal gait, Difficulty walking, Decreased mobility, Decreased balance, Decreased endurance  Visit Diagnosis: Acute pain of left knee  Stiffness of left knee, not elsewhere classified  Muscle weakness (generalized)  Other  abnormalities of gait and mobility  Difficulty in walking, not elsewhere classified     Problem List Patient Active Problem List   Diagnosis Date Noted  . OA (osteoarthritis) of knee 01/21/2017  . Left knee pain 04/19/2014  . BRONCHITIS, ACUTE 07/21/2010    Percival Spanish, PT, MPT 02/13/2017, 11:02 AM  [facility] [address]  [place], [place], [address] Phone: [phone]   Fax:  [phone]  Name: Joanna Willis MRN: [id number] Date of Birth: [birth year]

## 2017-02-16 ENCOUNTER — Ambulatory Visit: Payer: 59

## 2017-02-16 DIAGNOSIS — R262 Difficulty in walking, not elsewhere classified: Secondary | ICD-10-CM

## 2017-02-16 DIAGNOSIS — R2689 Other abnormalities of gait and mobility: Secondary | ICD-10-CM

## 2017-02-16 DIAGNOSIS — M25662 Stiffness of left knee, not elsewhere classified: Secondary | ICD-10-CM | POA: Diagnosis not present

## 2017-02-16 DIAGNOSIS — M6281 Muscle weakness (generalized): Secondary | ICD-10-CM

## 2017-02-16 DIAGNOSIS — M25562 Pain in left knee: Secondary | ICD-10-CM | POA: Diagnosis not present

## 2017-02-16 NOTE — Therapy (Signed)
[facility] [address]  [place], [place], [address] Phone: [phone]   Fax:  [phone]  Physical Therapy Treatment  Patient Details  Name: Joanna Willis MRN: [id number] Date of Birth: 10/08/1955 Referring Provider: [name], MD  Encounter Date: 02/16/2017      PT End of Session - 02/16/17 0934    Visit Number 10   Number of Visits 12   Date for PT Re-Evaluation 02/20/17   Authorization Type [facility]   PT Start Time 0928   PT Stop Time 4628   PT Time Calculation (min) 46 min   Activity Tolerance Patient tolerated treatment well   Behavior During Therapy [facility] for tasks assessed/performed      Past Medical History:  Diagnosis Date  . Depression   . GERD (gastroesophageal reflux disease)   . Hypercholesteremia   . PONV (postoperative nausea and vomiting)   . Postmenopausal   . Wears glasses     Past Surgical History:  Procedure Laterality Date  . ABDOMINAL HYSTERECTOMY  2005   did not remove ovaries  . CESAREAN SECTION  2000  . COLONOSCOPY    . FOOT ARTHRODESIS  1995   right  . KNEE ARTHROSCOPY WITH MEDIAL MENISECTOMY Left 05/17/2014   Procedure: LEFT ARTHROSCOPY KNEE WITH PARTIAL MEDIAL MENISECTOMY, CHONDROPLASTY OF LATERAL FEMORAL CONDYLE;  Surgeon: Alta Corning, MD;  Location: [place];  Service: Orthopedics;  Laterality: Left;  . PARTIAL KNEE ARTHROPLASTY Left 01/21/2017   Procedure: UNICOMPARTMENTAL LEFT KNEE;  Surgeon: [name], MD;  Location[facility];  Service: Orthopedics;  Laterality: Left;    There were no vitals filed for this visit.      Subjective Assessment - 02/16/17 0931    Subjective Pt. noting she felt good following last session.     Patient Stated Goals "to get moving like normal"   Currently in Pain? Yes   Pain Score 4    Pain Location Knee   Pain Orientation Left   Pain Descriptors / Indicators Aching   Pain Type Surgical pain   Pain Frequency  Intermittent   Multiple Pain Sites No            [facility] PT Assessment - 02/16/17 0954      Observation/Other Assessments   Focus on Therapeutic Outcomes (FOTO)  41% (59% limitation)     AROM   Left Knee Extension 1   Left Knee Flexion 105     Strength   Strength Assessment Site Hip;Knee   Right/Left Hip Right;Left   Right Hip Flexion 4+/5   Right Hip Extension 4+/5   Right Hip ABduction 5/5   Right Hip ADduction 4+/5   Left Hip Flexion 4-/5   Left Hip Extension 4/5   Left Hip ABduction 4+/5   Left Hip ADduction 4+/5   Right/Left Knee Left   Right Knee Flexion 5/5   Right Knee Extension 5/5   Left Knee Flexion 3+/5   Left Knee Extension 4/5                     [facility] Adult PT Treatment/Exercise - 02/16/17 0936      Ambulation/Gait   Ambulation/Gait Yes   Ambulation/Gait Assistance 7: Independent   Ambulation/Gait Assistance Details Gait nearly symmetrical with only slight antalgic gait; pt. verbalizing awareness of need for heel strike, upright posture, and L knee/hip flexion without cueing   Ambulation Distance (Feet) 300 Feet  Knee/Hip Exercises: Stretches   Sports administrator Left;1 rep;60 seconds   Sports administrator Limitations with strap   Hip Flexor Stretch Left;1 rep;60 seconds   Hip Flexor Stretch Limitations in mod thomas pos   Other Knee/Hip Stretches Standing L knee lunge stretch 5" x 10 reps     Knee/Hip Exercises: Aerobic   Recumbent Bike full revolutions x 1 min; lvl 1.0 x 6'     Knee/Hip Exercises: Standing   Knee Flexion Left;15 reps   Knee Flexion Limitations 3# at counter   Functional Squat 15 reps;1 set;3 seconds   Functional Squat Limitations TRX; heel raise at bottom of movement     Knee/Hip Exercises: Supine   Single Leg Bridge Left;15 reps  3" hold    Straight Leg Raises Left;15 reps;Strengthening   Straight Leg Raises Limitations 3#, 5" hold   Knee Flexion Left;AAROM;15 reps   Knee Flexion Limitations L heel on peatut p-ball  5" x 20 reps; focusing on flexion ROM and HS activation with hold      Vasopneumatic   Number Minutes Vasopneumatic  15 minutes   Vasopnuematic Location  Knee   Vasopneumatic Pressure High   Vasopneumatic Temperature  lowest                PT Education - 02/16/17 1035    Education provided Yes   Education Details Single leg bridge, prone quad stretch    Person(s) Educated Patient   Methods Explanation;Demonstration;Verbal cues;Handout   Comprehension Verbalized understanding;Returned demonstration;Verbal cues required;Need further instruction             PT Long Term Goals - 02/16/17 1005      PT LONG TERM GOAL #1   Title Independent with ongoing HEP    Status On-going     PT LONG TERM GOAL #2   Title Pt will ambuate with even stride length and weight shift for normal gait pattern with or w/o least restrictive assistive device   Status On-going     PT LONG TERM GOAL #3   Title L knee AROM >/= 3-120 for normal gait mechnics and stair negotiation   Status Partially Met     PT LONG TERM GOAL #4   Title L hip and knee strength at least 4/5 or greater for improved gait stability and activity tolerance   Status Partially Met  8.27.18: met for all except L hip flexion and L knee flexion     PT LONG TERM GOAL #5   Title Pt will be able to complete household chores +/- job tasks without limitation due to L knee pain or LOM   Status On-going               Plan - 02/16/17 0935    Clinical Impression Statement Pt. noting she felt good after last PT session and feels the "leg getting stronger".  Partially meeting strength and L knee ROM goal today.  HEP updated to focus on remaining hip/knee weaknesses.  Pt. nearly symmetric with gait pattern now without AD and self-aware of need for heel strike/knee flexion.  Pt. progressing well at this point.     PT Treatment/Interventions Patient/family education;ADLs/Self Care Home Management;Therapeutic exercise;Therapeutic  activities;Functional mobility training;Gait training;Stair training;Neuromuscular re-education;Balance training;Manual techniques;Scar mobilization;Passive range of motion;Taping;Dry needling;Cryotherapy;Vasopneumatic Device;Electrical Stimulation;Iontophoresis 37m/ml Dexamethasone   PT Next Visit Plan L knee P/AA/AROM; L LE strengthening as tolerated; gait training with SPC to normalize gait pattern; modalities PRN for pain      Patient will benefit from  skilled therapeutic intervention in order to improve the following deficits and impairments:  Pain, Decreased range of motion, Decreased strength, Impaired flexibility, Increased muscle spasms, Increased edema, Decreased scar mobility, Decreased activity tolerance, Abnormal gait, Difficulty walking, Decreased mobility, Decreased balance, Decreased endurance  Visit Diagnosis: Acute pain of left knee  Stiffness of left knee, not elsewhere classified  Muscle weakness (generalized)  Other abnormalities of gait and mobility  Difficulty in walking, not elsewhere classified     Problem List Patient Active Problem List   Diagnosis Date Noted  . OA (osteoarthritis) of knee 01/21/2017  . Left knee pain 04/19/2014  . BRONCHITIS, ACUTE 07/21/2010    Bess Harvest, PTA 02/16/17 10:47 AM  [facility] [address]  [place], [place], [address] Phone: [phone]   Fax:  [phone]  Name: KURSTIN DIMARZO MRN: [id number] Date of Birth: Jul 08, 1955

## 2017-02-18 ENCOUNTER — Ambulatory Visit: Payer: 59 | Admitting: Physical Therapy

## 2017-02-18 DIAGNOSIS — M6281 Muscle weakness (generalized): Secondary | ICD-10-CM | POA: Diagnosis not present

## 2017-02-18 DIAGNOSIS — M25562 Pain in left knee: Secondary | ICD-10-CM | POA: Diagnosis not present

## 2017-02-18 DIAGNOSIS — M25662 Stiffness of left knee, not elsewhere classified: Secondary | ICD-10-CM

## 2017-02-18 DIAGNOSIS — R262 Difficulty in walking, not elsewhere classified: Secondary | ICD-10-CM | POA: Diagnosis not present

## 2017-02-18 DIAGNOSIS — R2689 Other abnormalities of gait and mobility: Secondary | ICD-10-CM

## 2017-02-18 MED FILL — FLUTICASONE PROP 50 MCG SPR: 50 | 30 days supply | Qty: 16 | Fill #0

## 2017-02-18 NOTE — Therapy (Signed)
[facility] [place] [address]  [place], [place], [address] Phone: [phone]   Fax:  [phone]  Physical Therapy Treatment  Patient Details  Name: Joanna Willis MRN: [id number] Date of Birth: 03/04/1956 Referring Provider: [name], MD  Encounter Date: 02/18/2017      PT End of Session - 02/18/17 0933    Visit Number 11   Number of Visits 12   Date for PT Re-Evaluation 02/20/17   Authorization Type [facility]   PT Start Time 0933   PT Stop Time 1030   PT Time Calculation (min) 57 min   Activity Tolerance Patient tolerated treatment well   Behavior During Therapy [facility] for tasks assessed/performed      Past Medical History:  Diagnosis Date  . Depression   . GERD (gastroesophageal reflux disease)   . Hypercholesteremia   . PONV (postoperative nausea and vomiting)   . Postmenopausal   . Wears glasses     Past Surgical History:  Procedure Laterality Date  . ABDOMINAL HYSTERECTOMY  2005   did not remove ovaries  . CESAREAN SECTION  2000  . COLONOSCOPY    . FOOT ARTHRODESIS  1995   right  . KNEE ARTHROSCOPY WITH MEDIAL MENISECTOMY Left 05/17/2014   Procedure: LEFT ARTHROSCOPY KNEE WITH PARTIAL MEDIAL MENISECTOMY, CHONDROPLASTY OF LATERAL FEMORAL CONDYLE;  Surgeon: Alta Corning, MD;  Location: [facility];  Service: Orthopedics;  Laterality: Left;  . PARTIAL KNEE ARTHROPLASTY Left 01/21/2017   Procedure: UNICOMPARTMENTAL LEFT KNEE;  Surgeon: [name], MD;  Location: [facility];  Service: Orthopedics;  Laterality: Left;    There were no vitals filed for this visit.      Subjective Assessment - 02/18/17 0936    Subjective Pt reporting she was miserable Mon evening following PT and was unable to sleep Mon night due worsening of RLS. Only able to do HEP 1x sincelast therapy session.   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 Yes   Pain Score --  3-4/10   Pain Location Knee   Pain Orientation Left   Pain Descriptors / Indicators --  "thick"   Pain Type Surgical pain   Pain Frequency Intermittent                         [facility] Adult PT Treatment/Exercise - 02/18/17 0933      Exercises   Exercises Knee/Hip     Knee/Hip Exercises: Aerobic   Recumbent Bike lvl 2 x 6'     Knee/Hip Exercises: Standing   Hip Flexion Both;10 reps;Knee straight;Stengthening   Hip Flexion Limitations red TB, 1 pole A   Hip ADduction Both;10 reps;Strengthening   Hip ADduction Limitations red TB, 1 pole A   Hip Abduction Both;10 reps;Stengthening   Abduction Limitations red TB, 1 pole A   Hip Extension Both;10 reps;Knee straight;Stengthening   Extension Limitations red TB, 1 pole A     Knee/Hip Exercises: Seated   Hamstring Curl Left;15 reps;Strengthening   Hamstring Limitations red TB     Knee/Hip Exercises: Supine   Bridges with Ball Squeeze Both;10 reps  + alt knee extension   Other Supine Knee/Hip Exercises bridge + HS curl on peanut ball x10     Modalities   Modalities Vasopneumatic     Vasopneumatic   Number Minutes Vasopneumatic  15 minutes  Vasopnuematic Location  Knee   Vasopneumatic Pressure High   Vasopneumatic Temperature  lowest                PT Education - 02/18/17 1015    Education provided Yes   Education Details defered single leg bridge from HEP; HEP update - 4 way standing SLR with red TB, HS curl with red or green TB   Person(s) Educated Patient   Methods Explanation;Demonstration;Handout   Comprehension Verbalized understanding;Returned demonstration;Need further instruction             PT Long Term Goals - 02/16/17 1005      PT LONG TERM GOAL #1   Title Independent with ongoing HEP    Status On-going     PT LONG TERM GOAL #2   Title Pt will ambuate with even stride length and weight shift for normal gait pattern with or w/o  least restrictive assistive device   Status On-going     PT LONG TERM GOAL #3   Title L knee AROM >/= 3-120 for normal gait mechnics and stair negotiation   Status Partially Met     PT LONG TERM GOAL #4   Title L hip and knee strength at least 4/5 or greater for improved gait stability and activity tolerance   Status Partially Met  8.27.18: met for all except L hip flexion and L knee flexion     PT LONG TERM GOAL #5   Title Pt will be able to complete household chores +/- job tasks without limitation due to L knee pain or LOM   Status On-going               Plan - 02/18/17 0933    Clinical Impression Statement Pt reporting increased discomfort and worsening of RLS after last PT session. Feels that single leg bridge may be at least partially responsible and pt unable to perform this exercise correctly today, therefore deferred from HEP. Given strength deficits in L hamstrings noted on MMT last visit, increased hamstring strengthening focus in today's exercises and updated HEP to include this along with 4 way standing SLR for further hip strengthening. Anticipate recert on next visit due to continued ROM restrtictions and weakness,along with minor gait deviations.   Rehab Potential Good   PT Treatment/Interventions Patient/family education;ADLs/Self Care Home Management;Therapeutic exercise;Therapeutic activities;Functional mobility training;Gait training;Stair training;Neuromuscular re-education;Balance training;Manual techniques;Scar mobilization;Passive range of motion;Taping;Dry needling;Cryotherapy;Vasopneumatic Device;Electrical Stimulation;Iontophoresis 20m/ml Dexamethasone   PT Next Visit Plan Recert; L knee P/AA/AROM; L LE strengthening as tolerated; gait training with SPC to normalize gait pattern; modalities PRN for pain   Consulted and Agree with Plan of Care Patient      Patient will benefit from skilled therapeutic intervention in order to improve the following deficits  and impairments:  Pain, Decreased range of motion, Decreased strength, Impaired flexibility, Increased muscle spasms, Increased edema, Decreased scar mobility, Decreased activity tolerance, Abnormal gait, Difficulty walking, Decreased mobility, Decreased balance, Decreased endurance  Visit Diagnosis: Acute pain of left knee  Stiffness of left knee, not elsewhere classified  Muscle weakness (generalized)  Other abnormalities of gait and mobility  Difficulty in walking, not elsewhere classified     Problem List Patient Active Problem List   Diagnosis Date Noted  . OA (osteoarthritis) of knee 01/21/2017  . Left knee pain 04/19/2014  . BRONCHITIS, ACUTE 07/21/2010    JPercival Spanish PT, MPT 02/18/2017, 11:53 AM  [facility] [address]  [place], [place], [address] Phone: [phone]   Fax:  [phone]  Name: Joanna Willis MRN: [id number] Date of Birth: [birth year]

## 2017-02-20 ENCOUNTER — Ambulatory Visit: Payer: 59 | Admitting: Physical Therapy

## 2017-02-20 DIAGNOSIS — M6281 Muscle weakness (generalized): Secondary | ICD-10-CM | POA: Diagnosis not present

## 2017-02-20 DIAGNOSIS — M25662 Stiffness of left knee, not elsewhere classified: Secondary | ICD-10-CM | POA: Diagnosis not present

## 2017-02-20 DIAGNOSIS — M25562 Pain in left knee: Secondary | ICD-10-CM

## 2017-02-20 DIAGNOSIS — R262 Difficulty in walking, not elsewhere classified: Secondary | ICD-10-CM | POA: Diagnosis not present

## 2017-02-20 DIAGNOSIS — R2689 Other abnormalities of gait and mobility: Secondary | ICD-10-CM | POA: Diagnosis not present

## 2017-02-20 NOTE — Therapy (Signed)
[facility] [place] [facility] [address]  [place], [place], [address] Phone: [phone]   Fax:  [phone]  Physical Therapy Treatment  Patient Details  Name: Joanna Willis MRN: [id number] Date of Birth: Feb 03, 1956 Referring Provider: [name], MD  Encounter Date: 02/20/2017      PT End of Session - 02/20/17 0933    Visit Number 12   Number of Visits 20   Date for PT Re-Evaluation 03/20/17   Authorization Type [facility]   PT Start Time 0933   PT Stop Time 1016   PT Time Calculation (min) 43 min   Activity Tolerance Patient tolerated treatment well   Behavior During Therapy [facility] for tasks assessed/performed      Past Medical History:  Diagnosis Date  . Depression   . GERD (gastroesophageal reflux disease)   . Hypercholesteremia   . PONV (postoperative nausea and vomiting)   . Postmenopausal   . Wears glasses     Past Surgical History:  Procedure Laterality Date  . ABDOMINAL HYSTERECTOMY  2005   did not remove ovaries  . CESAREAN SECTION  2000  . COLONOSCOPY    . FOOT ARTHRODESIS  1995   right  . KNEE ARTHROSCOPY WITH MEDIAL MENISECTOMY Left 05/17/2014   Procedure: LEFT ARTHROSCOPY KNEE WITH PARTIAL MEDIAL MENISECTOMY, CHONDROPLASTY OF LATERAL FEMORAL CONDYLE;  Surgeon: Alta Corning, MD;  Location: [facility];  Service: Orthopedics;  Laterality: Left;  . PARTIAL KNEE ARTHROPLASTY Left 01/21/2017   Procedure: UNICOMPARTMENTAL LEFT KNEE;  Surgeon: [name], MD;  [facility];  Service: Orthopedics;  Laterality: Left;    There were no vitals filed for this visit.      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 Yes   Pain Score 2    Pain Location Knee   Pain  Orientation Left   Pain Descriptors / Indicators Tightness   Pain Type Surgical pain            [facility] PT Assessment - 02/20/17 0933      Assessment   Medical Diagnosis L unicompartmental knee replacement   Referring Provider [name], MD   Onset Date/Surgical Date 01/21/17   Next MD Visit 03/03/17     AROM   Left Knee Extension 0  3 dg with LAQ   Left Knee Flexion 114     PROM   Left Knee Extension 0   Left Knee Flexion 117     Strength   Right Hip Flexion 5/5   Right Hip Extension 4+/5   Right Hip ABduction 5/5   Right Hip ADduction 5/5   Left Hip Flexion 4-/5   Left Hip Extension 4/5   Left Hip ABduction 4+/5   Left Hip ADduction 4+/5   Right Knee Flexion 5/5   Right Knee Extension 5/5   Left Knee Flexion 4-/5   Left Knee Extension 4/5                     [facility] Adult PT Treatment/Exercise - 02/20/17 0933      Ambulation/Gait   Stairs Assistance 5: Supervision   Stair Management Technique One rail Right;Alternating pattern;Forwards   Number of Stairs 13  Height of Stairs 7   Gait Comments Gait becoming more symmetrical w/o AD but still noting stiffness in distal quads limiting normal flexion/extension mechanics at kne suring gait phases. Stair negotitation now reciprocal, but exaggerated efftort noted with R hip/knee flexion and lift on R as well as limited functional flexion and eccentric control on R with descent.     Knee/Hip Exercises: Aerobic   Elliptical lvl 2.5 x 6'     Knee/Hip Exercises: Machines for Strengthening   Cybex Knee Extension B LE 15# x15; B con/L ecc 15# x10   Cybex Knee Flexion B LE 20# x15; B con/L ecc 15# x10                     PT Long Term Goals - 02/20/17 0948      PT LONG TERM GOAL #1   Title Independent with ongoing HEP    Status Partially Met  met for current HEP   Target Date 03/20/17     PT LONG TERM GOAL #2   Title Pt will ambuate with even stride length and weight shift for normal  gait pattern with or w/o least restrictive assistive device   Status Partially Met   Target Date 03/20/17     PT LONG TERM GOAL #3   Title L knee AROM >/= 3-120 for normal gait mechanics and stair negotiation   Status Partially Met   Target Date 03/20/17     PT LONG TERM GOAL #4   Title L hip and knee strength at least 4/5 or greater for improved gait stability and activity tolerance   Status Partially Met  met except L hip flexion & L knee flexion   Target Date 03/20/17     PT LONG TERM GOAL #5   Title Pt will be able to complete household chores +/- job tasks without limitation due to L knee pain or LOM   Status On-going   Target Date 03/20/17     PT LONG TERM GOAL #6   Title Pt will be able to ascend/descend stairs with normal reciprocal pattern   Status New   Target Date 03/20/17               Plan - 02/20/17 0949    Clinical Impression Statement Joanna Willis has demonstrated good progress with recent PT sessions with L knee ROM progressing to 0-114 AROM (supported) and 0-117 PROM. Initially pt's quads were slow to wake up from surgery but now tolerating strengthening progression well, although weakness remains greatest in R hip and knee flexion. She has weaned off her AD and is able to demonstrate mostly symmetrical gait pattern with slight delayed coordination of knee flexion/extension movement causing slight alteration in step pattern. She is able to approach stairs with a reciprocal pattern but lacks normal movement flow on R with both ascent and descent. Recommend recert with frequency reduced to 2/wk for up to 4 more weeks to address remaining deficits.   Rehab Potential Good   PT Frequency 2x / week   PT Duration 4 weeks  up to 4 weeks   PT Treatment/Interventions Patient/family education;ADLs/Self Care Home Management;Therapeutic exercise;Therapeutic activities;Functional mobility training;Gait training;Stair training;Neuromuscular re-education;Balance training;Manual  techniques;Scar mobilization;Passive range of motion;Taping;Dry needling;Cryotherapy;Vasopneumatic Device;Electrical Stimulation;Iontophoresis 37m/ml Dexamethasone   PT Next Visit Plan L knee ROM; L LE strengthening; gait training & stair training w/o AD to normalize gait pattern; modalities PRN for pain/edema   Consulted and Agree with Plan of Care Patient  Patient will benefit from skilled therapeutic intervention in order to improve the following deficits and impairments:  Pain, Decreased range of motion, Decreased strength, Impaired flexibility, Increased muscle spasms, Increased edema, Decreased scar mobility, Decreased activity tolerance, Abnormal gait, Difficulty walking, Decreased mobility, Decreased balance, Decreased endurance  Visit Diagnosis: Acute pain of left knee - Plan: PT plan of care cert/re-cert  Stiffness of left knee, not elsewhere classified - Plan: PT plan of care cert/re-cert  Muscle weakness (generalized) - Plan: PT plan of care cert/re-cert  Other abnormalities of gait and mobility - Plan: PT plan of care cert/re-cert  Difficulty in walking, not elsewhere classified - Plan: PT plan of care cert/re-cert     Problem List Patient Active Problem List   Diagnosis Date Noted  . OA (osteoarthritis) of knee 01/21/2017  . Left knee pain 04/19/2014  . BRONCHITIS, ACUTE 07/21/2010    JoAnne M Kreis, PT, MPT 02/20/2017, 1:17 PM  [facility] [address]  [address] [place], [place], [address] Phone: [phone]   Fax:  [phone]  Name: Joanna Willis MRN: [id number] Date of Birth: 05/10/1956   

## 2017-02-24 ENCOUNTER — Ambulatory Visit: Payer: 59

## 2017-02-26 ENCOUNTER — Ambulatory Visit: Payer: 59 | Attending: Orthopedic Surgery

## 2017-02-26 DIAGNOSIS — R262 Difficulty in walking, not elsewhere classified: Secondary | ICD-10-CM | POA: Insufficient documentation

## 2017-02-26 DIAGNOSIS — R2689 Other abnormalities of gait and mobility: Secondary | ICD-10-CM | POA: Diagnosis not present

## 2017-02-26 DIAGNOSIS — M6281 Muscle weakness (generalized): Secondary | ICD-10-CM | POA: Diagnosis not present

## 2017-02-26 DIAGNOSIS — M25662 Stiffness of left knee, not elsewhere classified: Secondary | ICD-10-CM | POA: Insufficient documentation

## 2017-02-26 DIAGNOSIS — G8918 Other acute postprocedural pain: Secondary | ICD-10-CM | POA: Diagnosis not present

## 2017-02-26 DIAGNOSIS — M25562 Pain in left knee: Secondary | ICD-10-CM | POA: Insufficient documentation

## 2017-02-26 NOTE — Therapy (Signed)
[facility] [address]  [place], [place], [address] Phone: [phone]   Fax:  [phone]  Physical Therapy Treatment  Patient Details  Name: Joanna Willis MRN: [id number] Date of Birth: [birth year] Referring Provider: [name], MD  Encounter Date: 02/26/2017      PT End of Session - 02/26/17 0856    Visit Number 13   Number of Visits 20   Date for PT Re-Evaluation 03/20/17   Authorization Type [facility]   PT Start Time 4917  Pt. arrived late    PT Stop Time 0940   PT Time Calculation (min) 48 min   Activity Tolerance Patient tolerated treatment well   Behavior During Therapy [facility] for tasks assessed/performed      Past Medical History:  Diagnosis Date  . Depression   . GERD (gastroesophageal reflux disease)   . Hypercholesteremia   . PONV (postoperative nausea and vomiting)   . Postmenopausal   . Wears glasses     Past Surgical History:  Procedure Laterality Date  . ABDOMINAL HYSTERECTOMY  2005   did not remove ovaries  . CESAREAN SECTION  2000  . COLONOSCOPY    . FOOT ARTHRODESIS  1995   right  . KNEE ARTHROSCOPY WITH MEDIAL MENISECTOMY Left 05/17/2014   Procedure: LEFT ARTHROSCOPY KNEE WITH PARTIAL MEDIAL MENISECTOMY, CHONDROPLASTY OF LATERAL FEMORAL CONDYLE;  Surgeon: Alta Corning, MD;  Location: [facility];  Service: Orthopedics;  Laterality: Left;  . PARTIAL KNEE ARTHROPLASTY Left 01/21/2017   Procedure: UNICOMPARTMENTAL LEFT KNEE;  Surgeon: [name], MD;  [facility];  Service: Orthopedics;  Laterality: Left;    There were no vitals filed for this visit.      Subjective Assessment - 02/26/17 0855    Subjective Pt. noting she is able to "stand a little longer" now.  Pulled weeds over weekend and felt increased knee pain on Tuesday which has resolved.     Patient Stated Goals "to get moving like normal"   Currently in Pain? Yes   Pain Score 2    Pain Location  Knee   Pain Orientation Left   Pain Descriptors / Indicators Tightness   Pain Type Surgical pain   Pain Onset 1 to 4 weeks ago   Pain Frequency Intermittent   Aggravating Factors  Pulling weeds, bending down,    Pain Relieving Factors OTC pain meds, ice   Multiple Pain Sites No                         [facility] Adult PT Treatment/Exercise - 02/26/17 0907      Ambulation/Gait   Ambulation/Gait Yes   Ambulation/Gait Assistance 7: Independent   Ambulation/Gait Assistance Details Gait nearly symmetrical; still with delayed L hip/knee flexion on swing phase; worked on "butt kicks" to increase speed and activation of L HS with swing phase   Ambulation Distance (Feet) 180 Feet     Knee/Hip Exercises: Aerobic   Elliptical Lvl 3.0, 2 min each way   Nustep Lvl 6, 6 min     Knee/Hip Exercises: Machines for Strengthening   Cybex Knee Extension B LE 15# x15; B con/L ecc 15# x15   Cybex Knee Flexion B LE 25# x15; B con/L ecc 15# x 15 reps     Knee/Hip Exercises: Standing   Functional Squat 15 reps;1 set;3 seconds   Functional Squat Limitations counter      Knee/Hip Exercises: Supine  Straight Leg Raises Left;15 reps;Strengthening   Straight Leg Raises Limitations 3#, 5" hold   Other Supine Knee/Hip Exercises bridge + HS curl on peanut ball x10     Vasopneumatic   Number Minutes Vasopneumatic  10 minutes   Vasopnuematic Location  Knee   Vasopneumatic Pressure High   Vasopneumatic Temperature  lowest                     PT Long Term Goals - 02/20/17 0948      PT LONG TERM GOAL #1   Title Independent with ongoing HEP    Status Partially Met  met for current HEP   Target Date 03/20/17     PT LONG TERM GOAL #2   Title Pt will ambuate with even stride length and weight shift for normal gait pattern with or w/o least restrictive assistive device   Status Partially Met   Target Date 03/20/17     PT LONG TERM GOAL #3   Title L knee AROM >/= 3-120 for  normal gait mechanics and stair negotiation   Status Partially Met   Target Date 03/20/17     PT LONG TERM GOAL #4   Title L hip and knee strength at least 4/5 or greater for improved gait stability and activity tolerance   Status Partially Met  met except L hip flexion & L knee flexion   Target Date 03/20/17     PT LONG TERM GOAL #5   Title Pt will be able to complete household chores +/- job tasks without limitation due to L knee pain or LOM   Status On-going   Target Date 03/20/17     PT LONG TERM GOAL #6   Title Pt will be able to ascend/descend stairs with normal reciprocal pattern   Status New   Target Date 03/20/17               Plan - 02/26/17 0903    Clinical Impression Statement Joanna Willis noting L knee pain "flare-up" following weeding garden over weekend, which lasted until Tuesday.  Pt. with low-pain levels today with therex.  Tolerated all strengthening activities without issue today and seems to be progressing well.  Pt. requesting ice/compression to end treatment thus treatment ending with this to reduce post-exercise swelling and pain.  Will continued per pt. tolerance in coming visits.      PT Treatment/Interventions Patient/family education;ADLs/Self Care Home Management;Therapeutic exercise;Therapeutic activities;Functional mobility training;Gait training;Stair training;Neuromuscular re-education;Balance training;Manual techniques;Scar mobilization;Passive range of motion;Taping;Dry needling;Cryotherapy;Vasopneumatic Device;Electrical Stimulation;Iontophoresis 63m/ml Dexamethasone   PT Next Visit Plan L knee ROM; L LE strengthening; gait training & stair training w/o AD to normalize gait pattern; modalities PRN for pain/edema      Patient will benefit from skilled therapeutic intervention in order to improve the following deficits and impairments:  Pain, Decreased range of motion, Decreased strength, Impaired flexibility, Increased muscle spasms, Increased edema,  Decreased scar mobility, Decreased activity tolerance, Abnormal gait, Difficulty walking, Decreased mobility, Decreased balance, Decreased endurance  Visit Diagnosis: Acute pain of left knee  Stiffness of left knee, not elsewhere classified  Muscle weakness (generalized)  Other abnormalities of gait and mobility  Difficulty in walking, not elsewhere classified     Problem List Patient Active Problem List   Diagnosis Date Noted  . OA (osteoarthritis) of knee 01/21/2017  . Left knee pain 04/19/2014  . BRONCHITIS, ACUTE 07/21/2010    MBess Harvest PTA 02/26/17 7:10 PM  [facility]  [address]  [place], [place], [address] Phone: [phone]   Fax:  [phone]  Name: Joanna Willis MRN: [id number] Date of Birth: Feb 13, 1956

## 2017-02-27 DIAGNOSIS — Z0000 Encounter for general adult medical examination without abnormal findings: Secondary | ICD-10-CM | POA: Diagnosis not present

## 2017-02-27 DIAGNOSIS — G2581 Restless legs syndrome: Secondary | ICD-10-CM | POA: Diagnosis not present

## 2017-02-27 DIAGNOSIS — K219 Gastro-esophageal reflux disease without esophagitis: Secondary | ICD-10-CM | POA: Diagnosis not present

## 2017-02-27 MED FILL — rOPINIRole HCL 0.25 MG TABS: 0.25 | 30 days supply | Qty: 30 | Fill #0

## 2017-03-02 ENCOUNTER — Ambulatory Visit: Payer: 59

## 2017-03-02 DIAGNOSIS — M6281 Muscle weakness (generalized): Secondary | ICD-10-CM | POA: Diagnosis not present

## 2017-03-02 DIAGNOSIS — R262 Difficulty in walking, not elsewhere classified: Secondary | ICD-10-CM | POA: Diagnosis not present

## 2017-03-02 DIAGNOSIS — M25562 Pain in left knee: Secondary | ICD-10-CM | POA: Diagnosis not present

## 2017-03-02 DIAGNOSIS — M25662 Stiffness of left knee, not elsewhere classified: Secondary | ICD-10-CM | POA: Diagnosis not present

## 2017-03-02 DIAGNOSIS — R2689 Other abnormalities of gait and mobility: Secondary | ICD-10-CM | POA: Diagnosis not present

## 2017-03-02 DIAGNOSIS — Z760 Encounter for issue of repeat prescription: Secondary | ICD-10-CM | POA: Diagnosis not present

## 2017-03-02 DIAGNOSIS — G8918 Other acute postprocedural pain: Secondary | ICD-10-CM | POA: Diagnosis not present

## 2017-03-02 NOTE — Therapy (Signed)
[facility] [address]  [place], [place], [address] Phone: [phone]   Fax:  [phone]  Physical Therapy Treatment  Patient Details  Name: Joanna Willis MRN: [id number] Date of Birth: [birth year] Referring Provider: [name], MD  Encounter Date: 03/02/2017      PT End of Session - 03/02/17 0850    Visit Number 14   Number of Visits 20   Date for PT Re-Evaluation 03/20/17   Authorization Type [facility]   PT Start Time 0847   PT Stop Time 0944   PT Time Calculation (min) 57 min   Activity Tolerance Patient tolerated treatment well   Behavior During Therapy [facility] for tasks assessed/performed      Past Medical History:  Diagnosis Date  . Depression   . GERD (gastroesophageal reflux disease)   . Hypercholesteremia   . PONV (postoperative nausea and vomiting)   . Postmenopausal   . Wears glasses     Past Surgical History:  Procedure Laterality Date  . ABDOMINAL HYSTERECTOMY  2005   did not remove ovaries  . CESAREAN SECTION  2000  . COLONOSCOPY    . FOOT ARTHRODESIS  1995   right  . KNEE ARTHROSCOPY WITH MEDIAL MENISECTOMY Left 05/17/2014   Procedure: LEFT ARTHROSCOPY KNEE WITH PARTIAL MEDIAL MENISECTOMY, CHONDROPLASTY OF LATERAL FEMORAL CONDYLE;  Surgeon: Alta Corning, MD;  Location: [place];  Service: Orthopedics;  Laterality: Left;  . PARTIAL KNEE ARTHROPLASTY Left 01/21/2017   Procedure: UNICOMPARTMENTAL LEFT KNEE;  Surgeon: [name], MD;  Location: [facility];  Service: Orthopedics;  Laterality: Left;    There were no vitals filed for this visit.      Subjective Assessment - 03/02/17 0851    Subjective Jasimine doing well today.  Feels endurance with standing is improving slowly.     Patient Stated Goals "to get moving like normal"   Currently in Pain? No/denies   Pain Score 0-No pain   Multiple Pain Sites No            [facility] PT Assessment - 03/02/17 0855      AROM    Left Knee Extension 0   Left Knee Flexion 116     PROM   Left Knee Extension 0   Left Knee Flexion 117     Strength   Strength Assessment Site Hip;Knee   Right/Left Hip Right;Left   Right Hip Flexion 5/5   Right Hip Extension 4+/5   Right Hip ABduction 5/5   Right Hip ADduction 5/5   Left Hip Flexion 4-/5   Left Hip Extension 4/5   Left Hip ABduction 4+/5   Left Hip ADduction 4+/5   Right Knee Flexion 5/5   Right Knee Extension 5/5   Left Knee Flexion 4-/5   Left Knee Extension 4/5                     [facility] Adult PT Treatment/Exercise - 03/02/17 0855      Ambulation/Gait   Ambulation/Gait Yes   Ambulation/Gait Assistance 7: Independent   Ambulation Distance (Feet) 300 Feet   Stairs Yes   Stairs Assistance 6: Modified independent (Device/Increase time)   Stair Management Technique One rail Right;Alternating pattern;Forwards   Number of Stairs 13   Height of Stairs 7   Gait Comments Gait still with shortened step length however nearly symmetrical pattern.  Able to ascend/descend stairs x 13 stairs with reciprocal pattern however still some  quad instability with eccentric descending     Knee/Hip Exercises: Stretches   Passive Hamstring Stretch Left;1 rep;60 seconds   Passive Hamstring Stretch Limitations Gastroc + HS with strap   Quad Stretch Left;60 seconds;2 reps   Quad Stretch Limitations with strap     Knee/Hip Exercises: Aerobic   Stationary Bike ```   Recumbent Bike lvl 2 x 5'     Knee/Hip Exercises: Machines for Strengthening   Cybex Knee Flexion B LE 35# x 10; B con/L ecc 15# x 15 reps     Knee/Hip Exercises: Standing   Forward Step Up Left;15 reps;Step Height: 6";Hand Hold: 1   Forward Step Up Limitations light UE support on counter   3" eccentric lowering; much improved control    Step Down Left;15 reps;Step Height: 6";Hand Hold: 1   Step Down Limitations light UE support on counter    Other Standing Knee Exercises Standing L LE 3#  toe-clears to 15" machine seat x 20 reps; 1 ski pole     Knee/Hip Exercises: Supine   Straight Leg Raises Left;Strengthening;20 reps   Straight Leg Raises Limitations 3#, 5" hold   Other Supine Knee/Hip Exercises bridge + HS curl on peanut ball x 15     Vasopneumatic   Number Minutes Vasopneumatic  15 minutes   Vasopnuematic Location  Knee   Vasopneumatic Pressure High   Vasopneumatic Temperature  lowest                     PT Long Term Goals - 03/02/17 0911      PT LONG TERM GOAL #1   Title Independent with ongoing HEP    Status Partially Met  met for current HEP     PT LONG TERM GOAL #2   Title Pt will ambuate with even stride length and weight shift for normal gait pattern with or w/o least restrictive assistive device   Status Partially Met     PT LONG TERM GOAL #3   Title L knee AROM >/= 3-120 for normal gait mechanics and stair negotiation   Status Partially Met     PT LONG TERM GOAL #4   Title L hip and knee strength at least 4/5 or greater for improved gait stability and activity tolerance   Status Partially Met  met except L hip flexion & L knee flexion     PT LONG TERM GOAL #5   Title Pt will be able to complete household chores +/- job tasks without limitation due to L knee pain or LOM   Status On-going     PT LONG TERM GOAL #6   Title Pt will be able to ascend/descend stairs with normal reciprocal pattern   Status Partially Met  reciprocal pattern however required increased time and some L quad instability to descend                Plan - 03/02/17 0907    Clinical Impression Statement Joanna Willis doing well today noting improved endurance with standing activities.  Gait nearly symmetrical today without AD and able to ascend/descend stairs with reciprocal pattern however still with some L quad instability.  AROM improved today to 0-116 dg.  Pt. making good progress with therapy thus far and will continue to benefit from further skilled therapy to  improve functional strength and ROM.      PT Treatment/Interventions Patient/family education;ADLs/Self Care Home Management;Therapeutic exercise;Therapeutic activities;Functional mobility training;Gait training;Stair training;Neuromuscular re-education;Balance training;Manual techniques;Scar mobilization;Passive range of motion;Taping;Dry needling;Cryotherapy;Vasopneumatic Device;Dealer  Stimulation;Iontophoresis 77m/ml Dexamethasone   PT Next Visit Plan L knee ROM; L LE strengthening; gait training & stair training w/o AD to normalize gait pattern; modalities PRN for pain/edema      Patient will benefit from skilled therapeutic intervention in order to improve the following deficits and impairments:  Pain, Decreased range of motion, Decreased strength, Impaired flexibility, Increased muscle spasms, Increased edema, Decreased scar mobility, Decreased activity tolerance, Abnormal gait, Difficulty walking, Decreased mobility, Decreased balance, Decreased endurance  Visit Diagnosis: Acute pain of left knee  Stiffness of left knee, not elsewhere classified  Muscle weakness (generalized)  Other abnormalities of gait and mobility  Difficulty in walking, not elsewhere classified     Problem List Patient Active Problem List   Diagnosis Date Noted  . OA (osteoarthritis) of knee 01/21/2017  . Left knee pain 04/19/2014  . BRONCHITIS, ACUTE 07/21/2010    MBess Harvest PTA 03/02/17 10:47 AM  [facility] [place] [address]Phone: [phone]  Fax:  [phone] Name: Joanna HASTENMRN: [id number] of Birth: 109-Oct-1957

## 2017-03-03 DIAGNOSIS — Z471 Aftercare following joint replacement surgery: Secondary | ICD-10-CM | POA: Diagnosis not present

## 2017-03-03 DIAGNOSIS — Z96652 Presence of left artificial knee joint: Secondary | ICD-10-CM | POA: Diagnosis not present

## 2017-03-03 MED FILL — ATORVASTATIN 20 MG TABLET: 20 | 90 days supply | Qty: 90 | Fill #0 | Status: TO

## 2017-03-05 ENCOUNTER — Ambulatory Visit: Payer: 59 | Admitting: Physical Therapy

## 2017-03-05 DIAGNOSIS — M25662 Stiffness of left knee, not elsewhere classified: Secondary | ICD-10-CM

## 2017-03-05 DIAGNOSIS — M6281 Muscle weakness (generalized): Secondary | ICD-10-CM | POA: Diagnosis not present

## 2017-03-05 DIAGNOSIS — M25562 Pain in left knee: Secondary | ICD-10-CM | POA: Diagnosis not present

## 2017-03-05 DIAGNOSIS — R262 Difficulty in walking, not elsewhere classified: Secondary | ICD-10-CM

## 2017-03-05 DIAGNOSIS — R2689 Other abnormalities of gait and mobility: Secondary | ICD-10-CM

## 2017-03-05 DIAGNOSIS — G8918 Other acute postprocedural pain: Secondary | ICD-10-CM | POA: Diagnosis not present

## 2017-03-05 NOTE — Therapy (Signed)
[facility] [address]  [place], [place], [address] Phone: [phone]   Fax:  [phone]  Physical Therapy Treatment  Patient Details  Name: Joanna Willis MRN: [id number] Date of Birth: [birth year] Referring Provider: [name], MD  Encounter Date: 03/05/2017      PT End of Session - 03/05/17 0847    Visit Number 15   Number of Visits 20   Date for PT Re-Evaluation 03/20/17   Authorization Type [facility]   PT Start Time 0847   PT Stop Time 0941   PT Time Calculation (min) 54 min   Activity Tolerance Patient tolerated treatment well   Behavior During Therapy [facility] for tasks assessed/performed      Past Medical History:  Diagnosis Date  . Depression   . GERD (gastroesophageal reflux disease)   . Hypercholesteremia   . PONV (postoperative nausea and vomiting)   . Postmenopausal   . Wears glasses     Past Surgical History:  Procedure Laterality Date  . ABDOMINAL HYSTERECTOMY  2005   did not remove ovaries  . CESAREAN SECTION  2000  . COLONOSCOPY    . FOOT ARTHRODESIS  1995   right  . KNEE ARTHROSCOPY WITH MEDIAL MENISECTOMY Left 05/17/2014   Procedure: LEFT ARTHROSCOPY KNEE WITH PARTIAL MEDIAL MENISECTOMY, CHONDROPLASTY OF LATERAL FEMORAL CONDYLE;  Surgeon: Alta Corning, MD;  Location: [place];  Service: Orthopedics;  Laterality: Left;  . PARTIAL KNEE ARTHROPLASTY Left 01/21/2017   Procedure: UNICOMPARTMENTAL LEFT KNEE;  Surgeon: [name], MD;  Location[facility];  Service: Orthopedics;  Laterality: Left;    There were no vitals filed for this visit.      Subjective Assessment - 03/05/17 6962    Subjective MD please with progress. Approved continued PT through the end of the month.   Pertinent History L unicompartmental knee replacement 01/21/17   Patient Stated Goals "to get moving like normal"   Currently in Pain? Yes   Pain Score 2    Pain Location Knee   Pain Orientation  Left   Pain Descriptors / Indicators Tightness  "thick"   Pain Type Acute pain;Surgical pain                         [facility] Adult PT Treatment/Exercise - 03/05/17 0847      Knee/Hip Exercises: Stretches   Quad Stretch Left;60 seconds;2 reps   Quad Stretch Limitations prone with strap, distal thigh elevated on pillow for RF emphasis     Knee/Hip Exercises: Aerobic   Recumbent Bike lvl 3 x 6'     Knee/Hip Exercises: Machines for Strengthening   Cybex Knee Extension B LE 20# x15; B con/L ecc 15# x15   Cybex Knee Flexion B LE 35# x15; B con/L ecc 20# x10     Knee/Hip Exercises: Standing   Hip Flexion Both;10 reps;Knee straight;Stengthening   Hip Flexion Limitations green TB + opp LE SLS on blue foam oval; 1 pole A   Side Lunges Both;10 reps;3 seconds   Side Lunges Limitations TRX   Hip ADduction Both;10 reps;Strengthening   Hip ADduction Limitations green TB + opp LE SLS on blue foam oval; 1 pole A   Hip Abduction Both;10 reps;Stengthening   Abduction Limitations green TB + opp LE SLS on blue foam oval; 1 pole A   Hip Extension Both;10 reps;Knee straight;Stengthening   Extension Limitations green TB + opp LE SLS on  blue foam oval; 1 pole A   Step Down Left;15 reps;Step Height: 6";Hand Hold: 2;2 sets   Step Down Limitations fwd & lat eccentric lowering with heel touch   Functional Squat 20 reps;3 seconds   Functional Squat Limitations TRX     Modalities   Modalities Vasopneumatic     Vasopneumatic   Number Minutes Vasopneumatic  10 minutes   Vasopnuematic Location  Knee   Vasopneumatic Pressure High   Vasopneumatic Temperature  lowest                     PT Long Term Goals - 03/02/17 0911      PT LONG TERM GOAL #1   Title Independent with ongoing HEP    Status Partially Met  met for current HEP     PT LONG TERM GOAL #2   Title Pt will ambuate with even stride length and weight shift for normal gait pattern with or w/o least restrictive  assistive device   Status Partially Met     PT LONG TERM GOAL #3   Title L knee AROM >/= 3-120 for normal gait mechanics and stair negotiation   Status Partially Met     PT LONG TERM GOAL #4   Title L hip and knee strength at least 4/5 or greater for improved gait stability and activity tolerance   Status Partially Met  met except L hip flexion & L knee flexion     PT LONG TERM GOAL #5   Title Pt will be able to complete household chores +/- job tasks without limitation due to L knee pain or LOM   Status On-going     PT LONG TERM GOAL #6   Title Pt will be able to ascend/descend stairs with normal reciprocal pattern   Status Partially Met  reciprocal pattern however required increased time and some L quad instability to descend                Plan - 03/05/17 0071    Clinical Impression Statement Pt reporting MD pleased with progress and approving continued PT for 2 more weeks. Treatment focus on eccentric strengthening as well as proprioceptive training to improve stability/control with stair negotiation and overall stability/balance.   Rehab Potential Good   PT Treatment/Interventions Patient/family education;ADLs/Self Care Home Management;Therapeutic exercise;Therapeutic activities;Functional mobility training;Gait training;Stair training;Neuromuscular re-education;Balance training;Manual techniques;Scar mobilization;Passive range of motion;Taping;Dry needling;Cryotherapy;Vasopneumatic Device;Electrical Stimulation;Iontophoresis 76m/ml Dexamethasone   PT Next Visit Plan L knee ROM; L LE strengthening; gait training & stair training w/o AD to normalize gait pattern; modalities PRN for pain/edema   Consulted and Agree with Plan of Care Patient      Patient will benefit from skilled therapeutic intervention in order to improve the following deficits and impairments:  Pain, Decreased range of motion, Decreased strength, Impaired flexibility, Increased muscle spasms, Increased  edema, Decreased scar mobility, Decreased activity tolerance, Abnormal gait, Difficulty walking, Decreased mobility, Decreased balance, Decreased endurance  Visit Diagnosis: Acute pain of left knee  Stiffness of left knee, not elsewhere classified  Muscle weakness (generalized)  Other abnormalities of gait and mobility  Difficulty in walking, not elsewhere classified     Problem List Patient Active Problem List   Diagnosis Date Noted  . OA (osteoarthritis) of knee 01/21/2017  . Left knee pain 04/19/2014  . BRONCHITIS, ACUTE 07/21/2010    JPercival Spanish PT, MPT 03/05/2017, 10:36 AM  [facility] [address]  [place], [place], [address] Phone: [phone]   Fax:  [phone]  Name: Joanna Willis MRN: [id number] Date of Birth: 1955/09/08

## 2017-03-09 ENCOUNTER — Ambulatory Visit: Payer: 59 | Admitting: Physical Therapy

## 2017-03-09 DIAGNOSIS — M6281 Muscle weakness (generalized): Secondary | ICD-10-CM | POA: Diagnosis not present

## 2017-03-09 DIAGNOSIS — G8918 Other acute postprocedural pain: Secondary | ICD-10-CM | POA: Diagnosis not present

## 2017-03-09 DIAGNOSIS — M25562 Pain in left knee: Secondary | ICD-10-CM | POA: Diagnosis not present

## 2017-03-09 DIAGNOSIS — R2689 Other abnormalities of gait and mobility: Secondary | ICD-10-CM | POA: Diagnosis not present

## 2017-03-09 DIAGNOSIS — R262 Difficulty in walking, not elsewhere classified: Secondary | ICD-10-CM | POA: Diagnosis not present

## 2017-03-09 DIAGNOSIS — M25662 Stiffness of left knee, not elsewhere classified: Secondary | ICD-10-CM

## 2017-03-09 NOTE — Therapy (Addendum)
[facility] [address]  [place], [place], [address] Phone: [phone]   Fax:  [phone]  Physical Therapy Treatment  Patient Details  Name: ELIN FENLEY MRN: [id number] Date of Birth: Jun 25, 1955 Referring Provider: [name], MD  Encounter Date: 03/09/2017      PT End of Session - 03/09/17 0856    Visit Number 16   Number of Visits 20   Date for PT Re-Evaluation 03/20/17   Authorization Type [facility]   PT Start Time [phone]  pt arrived late   PT Stop Time 0933   PT Time Calculation (min) 42 min   Activity Tolerance Patient tolerated treatment well   Behavior During Therapy [facility] for tasks assessed/performed      Past Medical History:  Diagnosis Date  . Depression   . GERD (gastroesophageal reflux disease)   . Hypercholesteremia   . PONV (postoperative nausea and vomiting)   . Postmenopausal   . Wears glasses     Past Surgical History:  Procedure Laterality Date  . ABDOMINAL HYSTERECTOMY  2005   did not remove ovaries  . CESAREAN SECTION  2000  . COLONOSCOPY    . FOOT ARTHRODESIS  1995   right  . KNEE ARTHROSCOPY WITH MEDIAL MENISECTOMY Left 05/17/2014   Procedure: LEFT ARTHROSCOPY KNEE WITH PARTIAL MEDIAL MENISECTOMY, CHONDROPLASTY OF LATERAL FEMORAL CONDYLE;  Surgeon: Alta Corning, MD;  Location: [place];  Service: Orthopedics;  Laterality: Left;  . PARTIAL KNEE ARTHROPLASTY Left 01/21/2017   Procedure: UNICOMPARTMENTAL LEFT KNEE;  Surgeon: [name], MD;  [facility];  Service: Orthopedics;  Laterality: Left;    There were no vitals filed for this visit.      Subjective Assessment - 03/09/17 0853    Subjective Pt reporting not pain today but thinks she might have over-done things with her HEP on Friday as reports difficulty walking after completing her exercises.   Pertinent History L unicompartmental knee replacement 01/21/17   Patient Stated Goals "to get moving  like normal"   Currently in Pain? No/denies   Pain Score 0-No pain            [facility] PT Assessment - 03/09/17 0851      AROM   Left Knee Extension 0   Left Knee Flexion 121     PROM   Left Knee Extension 0   Left Knee Flexion 124                     [facility] Adult PT Treatment/Exercise - 03/09/17 0851      Knee/Hip Exercises: Stretches   Passive Hamstring Stretch Left;30 seconds;2 reps   Passive Hamstring Stretch Limitations isolating med & lat HS   Other Knee/Hip Stretches foam roller to quads (3 way), HS (med & lat) & L ITB x 1' each   Other Knee/Hip Stretches roller strick to L HS     Knee/Hip Exercises: Aerobic   Recumbent Bike lvl 3 x 6'     Knee/Hip Exercises: Standing   Terminal Knee Extension Left;15 reps   Theraband Level (Terminal Knee Extension) --  black TB   Other Standing Knee Exercises B side-stepping with red TB x 40 ft & fwd/back monster walk with red TB x 51f     Manual Therapy   Manual Therapy Joint mobilization;Soft tissue mobilization   Joint Mobilization L knee flex/ext mobs, L patellar mobs for increased flexion ROM   Soft tissue mobilization  L distal quads & distal lateral HS                PT Education - 03/09/17 0933    Education provided Yes   Education Details HEP update   Person(s) Educated Patient   Methods Explanation;Demonstration;Handout   Comprehension Verbalized understanding;Returned demonstration;Need further instruction             PT Long Term Goals - 03/02/17 0911      PT LONG TERM GOAL #1   Title Independent with ongoing HEP    Status Partially Met  met for current HEP     PT LONG TERM GOAL #2   Title Pt will ambuate with even stride length and weight shift for normal gait pattern with or w/o least restrictive assistive device   Status Partially Met     PT LONG TERM GOAL #3   Title L knee AROM >/= 3-120 for normal gait mechanics and stair negotiation   Status Partially Met     PT LONG  TERM GOAL #4   Title L hip and knee strength at least 4/5 or greater for improved gait stability and activity tolerance   Status Partially Met  met except L hip flexion & L knee flexion     PT LONG TERM GOAL #5   Title Pt will be able to complete household chores +/- job tasks without limitation due to L knee pain or LOM   Status On-going     PT LONG TERM GOAL #6   Title Pt will be able to ascend/descend stairs with normal reciprocal pattern   Status Partially Met  reciprocal pattern however required increased time and some L quad instability to descend                Plan - 03/09/17 0856    Clinical Impression Statement Pt reporting no knee pain today but notes some soreness at lateral HS insertion after prolonged standing or walking with some ttp by PT. STM performed to this area and pt educated in foam rolling for HS, quads and ITB as well as isolating lateral HS with stretches with strap. L knee ROM continues to improve with pt reporting MD anticipates 130-140 dg flexion by end of recovery.   Rehab Potential Good   PT Treatment/Interventions Patient/family education;ADLs/Self Care Home Management;Therapeutic exercise;Therapeutic activities;Functional mobility training;Gait training;Stair training;Neuromuscular re-education;Balance training;Manual techniques;Scar mobilization;Passive range of motion;Taping;Dry needling;Cryotherapy;Vasopneumatic Device;Electrical Stimulation;Iontophoresis 65m/ml Dexamethasone   PT Next Visit Plan L knee ROM; L LE strengthening; gait training & stair training w/o AD to normalize gait pattern; modalities PRN for pain/edema   Consulted and Agree with Plan of Care Patient      Patient will benefit from skilled therapeutic intervention in order to improve the following deficits and impairments:  Pain, Decreased range of motion, Decreased strength, Impaired flexibility, Increased muscle spasms, Increased edema, Decreased scar mobility, Decreased activity  tolerance, Abnormal gait, Difficulty walking, Decreased mobility, Decreased balance, Decreased endurance  Visit Diagnosis: Acute pain of left knee  Stiffness of left knee, not elsewhere classified  Muscle weakness (generalized)  Other abnormalities of gait and mobility  Difficulty in walking, not elsewhere classified     Problem List Patient Active Problem List   Diagnosis Date Noted  . OA (osteoarthritis) of knee 01/21/2017  . Left knee pain 04/19/2014  . BRONCHITIS, ACUTE 07/21/2010    JPercival Spanish PT, MPT 03/09/2017, 9:48 AM  [facility] [address]  [address] [facility], [place], [address] Phone: [phone]   Fax:  [phone]  Name: Melania A Kasperski MRN: [id number] Date of Birth: 04/20/1956   

## 2017-03-12 ENCOUNTER — Ambulatory Visit: Payer: 59

## 2017-03-12 DIAGNOSIS — M25562 Pain in left knee: Secondary | ICD-10-CM | POA: Diagnosis not present

## 2017-03-12 DIAGNOSIS — R2689 Other abnormalities of gait and mobility: Secondary | ICD-10-CM | POA: Diagnosis not present

## 2017-03-12 DIAGNOSIS — R262 Difficulty in walking, not elsewhere classified: Secondary | ICD-10-CM

## 2017-03-12 DIAGNOSIS — M6281 Muscle weakness (generalized): Secondary | ICD-10-CM

## 2017-03-12 DIAGNOSIS — M25662 Stiffness of left knee, not elsewhere classified: Secondary | ICD-10-CM

## 2017-03-12 DIAGNOSIS — G8918 Other acute postprocedural pain: Secondary | ICD-10-CM | POA: Diagnosis not present

## 2017-03-12 NOTE — Therapy (Signed)
[facility] [address]  [place], [place], [address] Phone: [phone]   Fax:  [phone]  Physical Therapy Treatment  Patient Details  Name: SOLANGE EMRY MRN: [id number] Date of Birth: [birth year] Referring Provider: [name], MD  Encounter Date: 03/12/2017      PT End of Session - 03/12/17 1020    Visit Number 17   Number of Visits 20   Date for PT Re-Evaluation 03/20/17   Authorization Type [facility]   PT Start Time 1018   PT Stop Time 1111   PT Time Calculation (min) 53 min   Activity Tolerance Patient tolerated treatment well   Behavior During Therapy [facility] for tasks assessed/performed      Past Medical History:  Diagnosis Date  . Depression   . GERD (gastroesophageal reflux disease)   . Hypercholesteremia   . PONV (postoperative nausea and vomiting)   . Postmenopausal   . Wears glasses     Past Surgical History:  Procedure Laterality Date  . ABDOMINAL HYSTERECTOMY  2005   did not remove ovaries  . CESAREAN SECTION  2000  . COLONOSCOPY    . FOOT ARTHRODESIS  1995   right  . KNEE ARTHROSCOPY WITH MEDIAL MENISECTOMY Left 05/17/2014   Procedure: LEFT ARTHROSCOPY KNEE WITH PARTIAL MEDIAL MENISECTOMY, CHONDROPLASTY OF LATERAL FEMORAL CONDYLE;  Surgeon: Alta Corning, MD;  Location: [facility];  Service: Orthopedics;  Laterality: Left;  . PARTIAL KNEE ARTHROPLASTY Left 01/21/2017   Procedure: UNICOMPARTMENTAL LEFT KNEE;  Surgeon: [name], MD;  Location: [facility];  Service: Orthopedics;  Laterality: Left;    There were no vitals filed for this visit.      Subjective Assessment - 03/12/17 1021    Subjective Pt. was able to ride road bike for 15 min in neighborhood two days ago without issue.     Patient Stated Goals "to get moving like normal"   Currently in Pain? No/denies   Pain Score 0-No pain   Multiple Pain Sites No                         [facility] Adult  PT Treatment/Exercise - 03/12/17 1031      Ambulation/Gait   Ambulation/Gait Yes   Ambulation/Gait Assistance 7: Independent   Ambulation Distance (Feet) 200 Feet   Stairs Yes   Stairs Assistance 6: Modified independent (Device/Increase time)   Stair Management Technique One rail Right;Alternating pattern;Forwards   Number of Stairs 13   Height of Stairs 7   Gait Comments Gait Normalized; stair navigation with reciprocal pattern slightly slower with L eccentic lowering however stable now with good quad control      Knee/Hip Exercises: Stretches   Gastroc Stretch Left;30 seconds;1 rep   Gastroc Stretch Limitations prostretch    Other Knee/Hip Stretches foam roller to quads (3 way), HS (med & lat) & L ITB x 1' each     Knee/Hip Exercises: Aerobic   Recumbent Bike lvl 3 x 6'     Knee/Hip Exercises: Standing   Lateral Step Up Step Height: 8";Hand Hold: 0;15 reps   Lateral Step Up Limitations emphasis on slow eccentric control    Forward Step Up Left;15 reps;Step Height: 8";Hand Hold: 0   Step Down Left;15 reps;Step Height: 6";1 set;Hand Hold: 0   Other Standing Knee Exercises L toe-clears 4# at ankle to mat table ~ 17" standing on foam oval 2 x 15 reps; 1  ski pole      Knee/Hip Exercises: Supine   Other Supine Knee/Hip Exercises bridge + HS curl on peanut ball x 15     Vasopneumatic   Number Minutes Vasopneumatic  15 minutes   Vasopnuematic Location  Knee   Vasopneumatic Pressure High   Vasopneumatic Temperature  lowest                     PT Long Term Goals - 03/12/17 1037      PT LONG TERM GOAL #1   Title Independent with ongoing HEP    Status Partially Met  met for current HEP     PT LONG TERM GOAL #2   Title Pt will ambuate with even stride length and weight shift for normal gait pattern with or w/o least restrictive assistive device   Status Achieved     PT LONG TERM GOAL #3   Title L knee AROM >/= 3-120 for normal gait mechanics and stair negotiation    Status Partially Met     PT LONG TERM GOAL #4   Title L hip and knee strength at least 4/5 or greater for improved gait stability and activity tolerance   Status Partially Met  met except L hip flexion & L knee flexion     PT LONG TERM GOAL #5   Title Pt will be able to complete household chores +/- job tasks without limitation due to L knee pain or LOM   Status On-going     PT LONG TERM GOAL #6   Title Pt will be able to ascend/descend stairs with normal reciprocal pattern   Status Achieved               Plan - 03/12/17 1022    Clinical Impression Statement Chabely doing well today noting she was able to ride bike in neighborhood for 15 min two days ago without issue.  Demonstrating good control with reciprocal ascend/descend on stairs today.  Gait normalized with even stride.  Advancement with eccentric quad strengthening today leaving pt. with some L knee soreness to end treatment.  Treatment ending with ice/compression to L knee to decrease post-exercise swelling and pain.     PT Treatment/Interventions Patient/family education;ADLs/Self Care Home Management;Therapeutic exercise;Therapeutic activities;Functional mobility training;Gait training;Stair training;Neuromuscular re-education;Balance training;Manual techniques;Scar mobilization;Passive range of motion;Taping;Dry needling;Cryotherapy;Vasopneumatic Device;Electrical Stimulation;Iontophoresis 40m/ml Dexamethasone   PT Next Visit Plan Review of HEP to prepare for transition to home program; L knee ROM; L LE strengthening; modalities PRN for pain/edema      Patient will benefit from skilled therapeutic intervention in order to improve the following deficits and impairments:  Pain, Decreased range of motion, Decreased strength, Impaired flexibility, Increased muscle spasms, Increased edema, Decreased scar mobility, Decreased activity tolerance, Abnormal gait, Difficulty walking, Decreased mobility, Decreased balance, Decreased  endurance  Visit Diagnosis: Acute pain of left knee  Stiffness of left knee, not elsewhere classified  Muscle weakness (generalized)  Other abnormalities of gait and mobility  Difficulty in walking, not elsewhere classified     Problem List Patient Active Problem List   Diagnosis Date Noted  . OA (osteoarthritis) of knee 01/21/2017  . Left knee pain 04/19/2014  . BRONCHITIS, ACUTE 07/21/2010    MBess Harvest PTA 03/12/17 6:58 PM  [facility] [address] [place] [address]Phone: [phone]  Fax:  [phone] Name: JEUDELL JULIANMRN: [id number] of Birth: [birth year]

## 2017-03-16 ENCOUNTER — Ambulatory Visit: Payer: 59 | Admitting: Physical Therapy

## 2017-03-16 DIAGNOSIS — M6281 Muscle weakness (generalized): Secondary | ICD-10-CM

## 2017-03-16 DIAGNOSIS — R2689 Other abnormalities of gait and mobility: Secondary | ICD-10-CM | POA: Diagnosis not present

## 2017-03-16 DIAGNOSIS — R262 Difficulty in walking, not elsewhere classified: Secondary | ICD-10-CM

## 2017-03-16 DIAGNOSIS — G8918 Other acute postprocedural pain: Secondary | ICD-10-CM | POA: Diagnosis not present

## 2017-03-16 DIAGNOSIS — M25562 Pain in left knee: Secondary | ICD-10-CM | POA: Diagnosis not present

## 2017-03-16 DIAGNOSIS — M25662 Stiffness of left knee, not elsewhere classified: Secondary | ICD-10-CM

## 2017-03-16 NOTE — Therapy (Signed)
[facility] [address]  [place], [place], [address] Phone: [phone]   Fax:  [phone]  Physical Therapy Treatment  Patient Details  Name: Joanna Willis MRN: [id number] Date of Birth: Mar 20, 1956 Referring Provider: [name], MD  Encounter Date: 03/16/2017      PT End of Session - 03/16/17 0853    Visit Number 18   Number of Visits 20   Date for PT Re-Evaluation 03/20/17   Authorization Type [facility]   PT Start Time 0853   PT Stop Time 0933   PT Time Calculation (min) 40 min   Activity Tolerance Patient tolerated treatment well   Behavior During Therapy [facility] for tasks assessed/performed      Past Medical History:  Diagnosis Date  . Depression   . GERD (gastroesophageal reflux disease)   . Hypercholesteremia   . PONV (postoperative nausea and vomiting)   . Postmenopausal   . Wears glasses     Past Surgical History:  Procedure Laterality Date  . ABDOMINAL HYSTERECTOMY  2005   did not remove ovaries  . CESAREAN SECTION  2000  . COLONOSCOPY    . FOOT ARTHRODESIS  1995   right  . KNEE ARTHROSCOPY WITH MEDIAL MENISECTOMY Left 05/17/2014   Procedure: LEFT ARTHROSCOPY KNEE WITH PARTIAL MEDIAL MENISECTOMY, CHONDROPLASTY OF LATERAL FEMORAL CONDYLE;  Surgeon: Alta Corning, MD;  Location: [facility];  Service: Orthopedics;  Laterality: Left;  . PARTIAL KNEE ARTHROPLASTY Left 01/21/2017   Procedure: UNICOMPARTMENTAL LEFT KNEE;  Surgeon: [name], MD;  Location[facility];  Service: Orthopedics;  Laterality: Left;    There were no vitals filed for this visit.      Subjective Assessment - 03/16/17 0855    Subjective Pt reporting flare-up after last therapy visit, necessitating taking prescription pain meds that evening. More sore today from busy weekend traveling to [place].   Pertinent History L unicompartmental knee replacement 01/21/17   Patient Stated Goals "to get moving like  normal"   Currently in Pain? Yes   Pain Score 4    Pain Location Knee   Pain Orientation Left   Pain Descriptors / Indicators Tightness;Aching  "thick"   Pain Type Acute pain;Surgical pain            [facility] PT Assessment - 03/16/17 0853      Assessment   Medical Diagnosis L unicompartmental knee replacement   Referring Provider [name], MD   Next MD Visit 04/10/17                     [facility] Adult PT Treatment/Exercise - 03/16/17 0853      Knee/Hip Exercises: Stretches   Other Knee/Hip Stretches foam roller to quads (3 way), HS (med & lat) & L ITB x 1' each     Knee/Hip Exercises: Aerobic   Elliptical lvl 3 x 6'     Knee/Hip Exercises: Standing   Step Down Left;10 reps;Step Height: 8";Hand Hold: 1   Step Down Limitations fwd & lat eccentric lowering with aim for heel touch   Functional Squat 15 reps;3 seconds   Functional Squat Limitations BOSU squat   Other Standing Knee Exercises BOSU inverted - lat & heel-toe weight shift x10     Knee/Hip Exercises: Supine   Single Leg Bridge Left;10 reps     Manual Therapy   Manual Therapy Joint mobilization   Joint Mobilization L knee flex/ext mobs, L patellar mobs for increased  flexion ROM                     PT Long Term Goals - 03/12/17 1037      PT LONG TERM GOAL #1   Title Independent with ongoing HEP    Status Partially Met  met for current HEP     PT LONG TERM GOAL #2   Title Pt will ambuate with even stride length and weight shift for normal gait pattern with or w/o least restrictive assistive device   Status Achieved     PT LONG TERM GOAL #3   Title L knee AROM >/= 3-120 for normal gait mechanics and stair negotiation   Status Partially Met     PT LONG TERM GOAL #4   Title L hip and knee strength at least 4/5 or greater for improved gait stability and activity tolerance   Status Partially Met  met except L hip flexion & L knee flexion     PT LONG TERM GOAL #5   Title Pt will  be able to complete household chores +/- job tasks without limitation due to L knee pain or LOM   Status On-going     PT LONG TERM GOAL #6   Title Pt will be able to ascend/descend stairs with normal reciprocal pattern   Status Achieved               Plan - 03/16/17 0858    Clinical Impression Statement Pt reporting increased "thickness" of her knee today, citing increased soreness after last PT session as well as traveling to [place] over the weekend with limited completion of HEP. Pt nearing end of current POC, therefore focused on review/update of HEP in prep for transition to HEP, with instructions provided in self-progression of exercises after discharge.   Rehab Potential Good   PT Treatment/Interventions Patient/family education;ADLs/Self Care Home Management;Therapeutic exercise;Therapeutic activities;Functional mobility training;Gait training;Stair training;Neuromuscular re-education;Balance training;Manual techniques;Scar mobilization;Passive range of motion;Taping;Dry needling;Cryotherapy;Vasopneumatic Device;Electrical Stimulation;Iontophoresis 29m/ml Dexamethasone   PT Next Visit Plan Discharge assessment   Consulted and Agree with Plan of Care Patient      Patient will benefit from skilled therapeutic intervention in order to improve the following deficits and impairments:  Pain, Decreased range of motion, Decreased strength, Impaired flexibility, Increased muscle spasms, Increased edema, Decreased scar mobility, Decreased activity tolerance, Abnormal gait, Difficulty walking, Decreased mobility, Decreased balance, Decreased endurance  Visit Diagnosis: Acute pain of left knee  Stiffness of left knee, not elsewhere classified  Muscle weakness (generalized)  Other abnormalities of gait and mobility  Difficulty in walking, not elsewhere classified     Problem List Patient Active Problem List   Diagnosis Date Noted  . OA (osteoarthritis) of knee 01/21/2017  .  Left knee pain 04/19/2014  . BRONCHITIS, ACUTE 07/21/2010    JPercival Spanish PT, MPT 03/16/2017, 12:18 PM  [facility] [place] [address]Phone: [phone]  Fax:  [phone] Name: Joanna POLLAKMRN: [id number] of Birth: [birth year]

## 2017-03-19 ENCOUNTER — Ambulatory Visit: Payer: 59 | Admitting: Physical Therapy

## 2017-03-23 ENCOUNTER — Ambulatory Visit: Payer: 59 | Attending: Orthopedic Surgery | Admitting: Physical Therapy

## 2017-03-23 DIAGNOSIS — R262 Difficulty in walking, not elsewhere classified: Secondary | ICD-10-CM | POA: Diagnosis not present

## 2017-03-23 DIAGNOSIS — M25662 Stiffness of left knee, not elsewhere classified: Secondary | ICD-10-CM | POA: Insufficient documentation

## 2017-03-23 DIAGNOSIS — R2689 Other abnormalities of gait and mobility: Secondary | ICD-10-CM | POA: Diagnosis not present

## 2017-03-23 DIAGNOSIS — M25562 Pain in left knee: Secondary | ICD-10-CM | POA: Diagnosis not present

## 2017-03-23 DIAGNOSIS — M6281 Muscle weakness (generalized): Secondary | ICD-10-CM | POA: Insufficient documentation

## 2017-03-23 NOTE — Therapy (Signed)
[facility] [address]  [place], [place], [address] Phone: [phone]   Fax:  [phone]  Physical Therapy Treatment  Patient Details  Name: Joanna Willis MRN: [id number] Date of Birth: 20-Dec-1955 Referring Provider: [name], MD  Encounter Date: 03/23/2017      PT End of Session - 03/23/17 1409    Visit Number 19   Number of Visits 20   Date for PT Re-Evaluation 03/20/17   Authorization Type [facility]   PT Start Time 5284   PT Stop Time 1441   PT Time Calculation (min) 38 min   Activity Tolerance Patient tolerated treatment well   Behavior During Therapy [facility] for tasks assessed/performed      Past Medical History:  Diagnosis Date  . Depression   . GERD (gastroesophageal reflux disease)   . Hypercholesteremia   . PONV (postoperative nausea and vomiting)   . Postmenopausal   . Wears glasses     Past Surgical History:  Procedure Laterality Date  . ABDOMINAL HYSTERECTOMY  2005   did not remove ovaries  . CESAREAN SECTION  2000  . COLONOSCOPY    . FOOT ARTHRODESIS  1995   right  . KNEE ARTHROSCOPY WITH MEDIAL MENISECTOMY Left 05/17/2014   Procedure: LEFT ARTHROSCOPY KNEE WITH PARTIAL MEDIAL MENISECTOMY, CHONDROPLASTY OF LATERAL FEMORAL CONDYLE;  Surgeon: Joanna Corning, MD;  Location: [facility];  Service: Orthopedics;  Laterality: Left;  . PARTIAL KNEE ARTHROPLASTY Left 01/21/2017   Procedure: UNICOMPARTMENTAL LEFT KNEE;  Surgeon: Joanna Arabian, MD;  [facility];  Service: Orthopedics;  Laterality: Left;    There were no vitals filed for this visit.      Subjective Assessment - 03/23/17 1408    Subjective Pt reporting more active with showing her house last week and noting some increased "bone soreness" with this, but pain free for past few days.   Pertinent History L unicompartmental knee replacement 01/21/17   Patient Stated Goals "to get moving like normal"   Currently in  Pain? No/denies   Pain Score 0-No pain            [facility] PT Assessment - 03/23/17 1403      Assessment   Medical Diagnosis L unicompartmental knee replacement   Referring Provider Joanna Arabian, MD   Onset Date/Surgical Date 01/21/17   Next MD Visit 04/10/17     Observation/Other Assessments   Focus on Therapeutic Outcomes (FOTO)  57% (43% limitation)     AROM   Left Knee Extension 0   Left Knee Flexion 124     PROM   Left Knee Extension 0   Left Knee Flexion 129     Strength   Right Hip Flexion 5/5   Right Hip Extension 4+/5   Right Hip ABduction 5/5   Right Hip ADduction 5/5   Left Hip Flexion 4+/5   Left Hip Extension 4+/5   Left Hip ABduction 5/5   Left Hip ADduction 5/5   Right Knee Flexion 5/5   Right Knee Extension 5/5   Left Knee Flexion 4+/5   Left Knee Extension 4+/5     Ambulation/Gait   Ambulation/Gait Assistance 7: Independent   Gait Pattern Within Functional Limits   Stairs Assistance 7: Independent   Stair Management Technique No rails;Alternating pattern   Number of Stairs 13   Height of Stairs 7  [facility] Adult PT Treatment/Exercise - 03/23/17 1403      Knee/Hip Exercises: Aerobic   Elliptical lvl 3 x 6'     Knee/Hip Exercises: Standing   Other Standing Knee Exercises L single leg squat 10x3" - R foot propped on mat table and UE support on back of chair                PT Education - 03/23/17 1441    Education provided Yes   Education Details Single leg squat added to HEP   Person(s) Educated Patient   Methods Explanation;Demonstration   Comprehension Verbalized understanding;Returned demonstration             PT Long Term Goals - 03/23/17 1411      PT LONG TERM GOAL #1   Title Independent with ongoing HEP    Status Achieved     PT LONG TERM GOAL #2   Title Pt will ambuate with even stride length and weight shift for normal gait pattern with or w/o least restrictive assistive device    Status Achieved     PT LONG TERM GOAL #3   Title L knee AROM >/= 3-120 for normal gait mechanics and stair negotiation   Status Achieved     PT LONG TERM GOAL #4   Title L hip and knee strength at least 4/5 or greater for improved gait stability and activity tolerance   Status Achieved     PT LONG TERM GOAL #5   Title Pt will be able to complete household chores +/- job tasks without limitation due to L knee pain or LOM   Status Achieved     PT LONG TERM GOAL #6   Title Pt will be able to ascend/descend stairs with normal reciprocal pattern   Status Achieved               Plan - 03/23/17 1410    Clinical Impression Statement [name] has demonstrated excellent progress with PT. L knee AROM currently 0-124 and PROM 0-129 with good scar and patellar mobility. B hip and knee strength now >/= 4+/5. Gait pattern symmetrical on all surfaces including stair ascent and descent, although pt noting increased effort on stair descent. Pt reporting no limitaitons in daily chores and tasks, but has yet to be cleared to return to work. She is independent with an ongoing HEP and verbalizes understanding of continue progression at home. All goals met at this time, therefore will proceed with discharge from PT for this episode.   Rehab Potential Good   PT Treatment/Interventions Patient/family education;ADLs/Self Care Home Management;Therapeutic exercise;Therapeutic activities;Functional mobility training;Gait training;Stair training;Neuromuscular re-education;Balance training;Manual techniques;Scar mobilization;Passive range of motion;Taping;Dry needling;Cryotherapy;Vasopneumatic Device;Electrical Stimulation;Iontophoresis 14m/ml Dexamethasone   PT Next Visit Plan Discharge   Consulted and Agree with Plan of Care Patient      Patient will benefit from skilled therapeutic intervention in order to improve the following deficits and impairments:  Pain, Decreased range of motion, Decreased strength,  Impaired flexibility, Increased muscle spasms, Increased edema, Decreased scar mobility, Decreased activity tolerance, Abnormal gait, Difficulty walking, Decreased mobility, Decreased balance, Decreased endurance  Visit Diagnosis: Acute pain of left knee  Stiffness of left knee, not elsewhere classified  Muscle weakness (generalized)  Other abnormalities of gait and mobility  Difficulty in walking, not elsewhere classified     Problem List Patient Active Problem List   Diagnosis Date Noted  . OA (osteoarthritis) of knee 01/21/2017  . Left knee pain 04/19/2014  . BRONCHITIS, ACUTE 07/21/2010  Percival Spanish, PT, MPT 03/23/2017, 6:48 PM  [facility] [address]  [place], [place], [address] Phone: [phone]   Fax:  [phone]  Name: Joanna Willis MRN: [id number] Date of Birth: [birth year]

## 2017-04-23 MED FILL — OMEPRAZOLE 20 MG CAP: 20 | 90 days supply | Qty: 90 | Fill #0

## 2017-04-29 DIAGNOSIS — Z1231 Encounter for screening mammogram for malignant neoplasm of breast: Secondary | ICD-10-CM | POA: Diagnosis not present

## 2017-06-04 MED FILL — FLUTICASONE PROP 50 MCG SPR: 50 | 30 days supply | Qty: 16 | Fill #0

## 2017-06-05 DIAGNOSIS — Z471 Aftercare following joint replacement surgery: Secondary | ICD-10-CM | POA: Diagnosis not present

## 2017-06-05 DIAGNOSIS — Z96652 Presence of left artificial knee joint: Secondary | ICD-10-CM | POA: Diagnosis not present

## 2017-06-05 DIAGNOSIS — M1712 Unilateral primary osteoarthritis, left knee: Secondary | ICD-10-CM | POA: Diagnosis not present

## 2017-06-05 DIAGNOSIS — G5782 Other specified mononeuropathies of left lower limb: Secondary | ICD-10-CM | POA: Diagnosis not present

## 2017-06-09 MED FILL — ATORVASTATIN 20 MG TABLET: 20 | 90 days supply | Qty: 90 | Fill #0

## 2017-06-10 ENCOUNTER — Ambulatory Visit: Payer: 59 | Attending: Physician Assistant | Admitting: Physical Therapy

## 2017-06-10 ENCOUNTER — Encounter: Payer: Self-pay | Admitting: Physical Therapy

## 2017-06-10 ENCOUNTER — Other Ambulatory Visit: Payer: Self-pay

## 2017-06-10 DIAGNOSIS — R262 Difficulty in walking, not elsewhere classified: Secondary | ICD-10-CM | POA: Diagnosis not present

## 2017-06-10 DIAGNOSIS — R29898 Other symptoms and signs involving the musculoskeletal system: Secondary | ICD-10-CM | POA: Insufficient documentation

## 2017-06-10 DIAGNOSIS — M25562 Pain in left knee: Secondary | ICD-10-CM | POA: Diagnosis not present

## 2017-06-10 DIAGNOSIS — M25662 Stiffness of left knee, not elsewhere classified: Secondary | ICD-10-CM | POA: Diagnosis not present

## 2017-06-10 NOTE — Patient Instructions (Addendum)
Straight Leg Raise: With External Leg Rotation    Lie on back with right leg straight, opposite leg bent. Rotate straight leg out and lift __~8__ inches. Repeat __15__ times per set.   Knee Extension: Step-Down Forward / Sideways / Backward (Eccentric)    Stand, holding support, affected foot on step. Slowly bend affected knee for 3-5 seconds and bring other heel forward to floor. Quickly straighten affected leg. Repeat, placing foot flat to side. Repeat, touching toe behind. _15__ reps per set.   Bridge   Lie back, legs bent. Inhale, pressing hips up. Keeping ribs in, lengthen lower back. Exhale, rolling down along spine from top. Repeat __15__ times. 1. Have feet propped up 2. Squeeze ball between knees and kick out L leg  IONTOPHORESIS PATIENT PRECAUTIONS & CONTRAINDICATIONS:  . Redness under one or both electrodes can occur.  This characterized by a uniform redness that usually disappears within 12 hours of treatment. . Small pinhead size blisters may result in response to the drug.  Contact your physician if the problem persists more than 24 hours. . On rare occasions, iontophoresis therapy can result in temporary skin reactions such as rash, inflammation, irritation or burns.  The skin reactions may be the result of individual sensitivity to the ionic solution used, the condition of the skin at the start of treatment, reaction to the materials in the electrodes, allergies or sensitivity to dexamethasone, or a poor connection between the patch and your skin.  Discontinue using iontophoresis if you have any of these reactions and report to your therapist. . Remove the Patch or electrodes if you have any undue sensation of pain or burning during the treatment and report discomfort to your therapist. . Tell your Therapist if you have had known adverse reactions to the application of electrical current. . If using the Patch, the LED light will turn off when treatment is complete and  the patch can be removed.  Approximate treatment time is 1-3 hours.  Remove the patch when light goes off or after 6 hours. . The Patch can be worn during normal activity, however excessive motion where the electrodes have been placed can cause poor contact between the skin and the electrode or uneven electrical current resulting in greater risk of skin irritation. Marland Kitchen Keep out of the reach of children.   . DO NOT use if you have a cardiac pacemaker or any other electrically sensitive implanted device. . DO NOT use if you have a known sensitivity to dexamethasone. . DO NOT use during Magnetic Resonance Imaging (MRI). . DO NOT use over broken or compromised skin (e.g. sunburn, cuts, or acne) due to the increased risk of skin reaction. . DO NOT SHAVE over the area to be treated:  To establish good contact between the Patch and the skin, excessive hair may be clipped. . DO NOT place the Patch or electrodes on or over your eyes, directly over your heart, or brain. . DO NOT reuse the Patch or electrodes as this may cause burns to occur.

## 2017-06-10 NOTE — Therapy (Signed)
[facility] [address]  [place], [place], [address] Phone: [phone]   Fax:  [phone]  Physical Therapy Evaluation  Patient Details  Name: Joanna Willis MRN: [id number] Date of Birth: [birth year] Referring Provider: Judith Part. Chabon PA-C   Encounter Date: 06/10/2017  PT End of Session - 06/10/17 0955    Visit Number  1    Number of Visits  6    Date for PT Re-Evaluation  07/08/17    PT Start Time  0758    PT Stop Time  0836    PT Time Calculation (min)  38 min    Activity Tolerance  Patient tolerated treatment well    Behavior During Therapy  [facility] for tasks assessed/performed       Past Medical History:  Diagnosis Date  . Depression   . GERD (gastroesophageal reflux disease)   . Hypercholesteremia   . PONV (postoperative nausea and vomiting)   . Postmenopausal   . Wears glasses     Past Surgical History:  Procedure Laterality Date  . ABDOMINAL HYSTERECTOMY  2005   did not remove ovaries  . CESAREAN SECTION  2000  . COLONOSCOPY    . FOOT ARTHRODESIS  1995   right  . KNEE ARTHROSCOPY WITH MEDIAL MENISECTOMY Left 05/17/2014   Procedure: LEFT ARTHROSCOPY KNEE WITH PARTIAL MEDIAL MENISECTOMY, CHONDROPLASTY OF LATERAL FEMORAL CONDYLE;  Surgeon: Alta Corning, MD;  Location: [place];  Service: Orthopedics;  Laterality: Left;  . PARTIAL KNEE ARTHROPLASTY Left 01/21/2017   Procedure: UNICOMPARTMENTAL LEFT KNEE;  Surgeon: [name], MD;  Location: [facility];  Service: Orthopedics;  Laterality: Left;    There were no vitals filed for this visit.   Subjective Assessment - 06/10/17 0759    Subjective  pain has been persistent for ~8 weeks. Most pain with first few step, and then dissipates as she walks. Sharp pain at medial L joint line. No pain during sitting. No issue with stairs. Denies buckling or feelings of weakness. Pain with quicker steps.     Limitations  Walking first few step     Diagnostic tests  xray - negative    Patient Stated Goals  improve pain    Currently in Pain?  No/denies    Pain Score  -- up to10/10 wiht first few steps then gradually lowering    Pain Location  Knee    Pain Orientation  Left;Medial    Pain Descriptors / Indicators  Sharp;Shooting    Pain Type  Acute pain    Pain Onset  More than a month ago    Pain Frequency  Intermittent    Aggravating Factors   first few steps after prolonged sitting    Pain Relieving Factors  ice 2x/day         [facility] PT Assessment - 06/10/17 0802      Assessment   Medical Diagnosis  Neuritis of L saphenous nerve; S/p L unicompartmental knee replacement    Referring Provider  Judith Part. Chabon PA-C    Onset Date/Surgical Date  -- ~8 weeks ago    Prior Therapy  yes      Precautions   Precautions  None      Restrictions   Weight Bearing Restrictions  No      Balance Screen   Has the patient fallen in the past 6 months  No    Has the patient had a decrease in activity level because  of a fear of falling?   No    Is the patient reluctant to leave their home because of a fear of falling?   No      Home Environment   Living Environment  Private residence    Type of [place] entrance    [place]  Two level;Able to live on main level with bedroom/bathroom    [place] - 2 wheels;[place] - single point      Prior Function   Level of Independence  Independent    Vocation  Full time employment    Occupational psychologist - pulmonary rehab      Cognition   Overall Cognitive Status  Within Functional Limits for tasks assessed      Observation/Other Assessments   Focus on Therapeutic Outcomes (FOTO)   Knee: 51 (49% limited, predicted 37% limited)      Sensation   Light Touch  Appears Intact      Coordination   Gross Motor Movements are Fluid and Coordinated  Yes      ROM / Strength   AROM / PROM / Strength  AROM;Strength      AROM   AROM Assessment  Site  Knee    Right/Left Knee  Left    Left Knee Extension  0    Left Knee Flexion  125      Strength   Strength Assessment Site  Hip;Knee    Right/Left Hip  Left    Left Hip Flexion  4/5    Right/Left Knee  Left    Left Knee Flexion  4+/5    Left Knee Extension  4+/5 some pain at medial knee with MMT      Palpation   Patella mobility  good mobility in all planes - reports slight pain with pressure at inferior patella    Palpation comment  TTP along anterior medial knee             Objective measurements completed on examination: See above findings.      [facility] Adult PT Treatment/Exercise - 06/10/17 0001      Exercises   Exercises  Knee/Hip      Knee/Hip Exercises: Standing   Step Down  Left;10 reps;Hand Hold: 1;Step Height: 8" fearful of pain      Knee/Hip Exercises: Supine   Bridges with Ball Squeeze  15 reps with L LAQ at full bridge position    Straight Leg Raise with External Rotation  Strengthening;Left;15 reps    Other Supine Knee/Hip Exercises  HS bridge with feet elevated on 8" stool x 12      Modalities   Modalities  Iontophoresis      Iontophoresis   Type of Iontophoresis  Dexamethasone    Location  L anterior/medial knee    Dose  1.0 mL    Time  4-6 hours; 80 mA             PT Education - 06/10/17 0954    Education provided  Yes    Education Details  exam findings, POC, HEP, ionto uses    Person(s) Educated  Patient    Methods  Explanation;Demonstration;Handout    Comprehension  Verbalized understanding;Returned demonstration          PT Long Term Goals - 06/10/17 1001      PT LONG TERM GOAL #1   Title  patient to be independent with advanced HEP  Status  New    [place] Date  07/08/17      PT LONG TERM GOAL #2   Title  Patient to ambualte with good heel toe mechanics with normal weight shift to L LE with pain no greater than 2/10    Status  New    Target Date  07/08/17      PT LONG TERM GOAL #3   Title  patient to report  ability to perform exercise regimen and normal work tasks with pain no greater than 2/10    Status  New    Target Date  07/08/17             Plan - 06/10/17 0956    Clinical Impression Statement  Joanna Willis is a pleasant 61 y/o female s/p L unicompartmental knee arthroplasty on 01/21/17. Patient previously seen in PT with excellent return of ROM, strength, and gait mechanics and subsequently discharged from PT. Patient now returning with primary complaints of L anterior/medial kne epain that is most exacerbated with walking following prolonged sitting. Patient today with some limited gait mechanics due to pain, TTP at anterior/medial knee, as well as slight reduction in strength with pain produced with L quadriceps MMT. Patient today with orders for iontophoresis with education completed on uses, risks, and safety with ionto patch with good carryover. HEP intitiated as well as application of ionto with good tolerance. Will benefit from PT to address pain and gait deficits to allow for improved mobility and QOL.    Clinical Presentation  Stable    Clinical Decision Making  Low    Rehab Potential  Good    PT Frequency  2x / week    PT Duration  3 weeks    PT Treatment/Interventions  ADLs/Self Care Home Management;Cryotherapy;Electrical Stimulation;Iontophoresis 4mg /ml Dexamethasone;Moist Heat;Therapeutic exercise;Therapeutic activities;Functional mobility training;Gait training;Ultrasound;Balance training;Neuromuscular re-education;Patient/family education;Manual techniques;Vasopneumatic Device;Taping;Dry needling;Passive range of motion    Consulted and Agree with Plan of Care  Patient       Patient will benefit from skilled therapeutic intervention in order to improve the following deficits and impairments:  Decreased activity tolerance, Decreased mobility, Difficulty walking, Pain  Visit Diagnosis: Acute pain of left knee  Difficulty in walking, not elsewhere classified  Other symptoms  and signs involving the musculoskeletal system     Problem List Patient Active Problem List   Diagnosis Date Noted  . OA (osteoarthritis) of knee 01/21/2017  . Left knee pain 04/19/2014  . BRONCHITIS, ACUTE 07/21/2010     Lanney Gins, PT, DPT 06/10/17 10:04 AM   [facility] [address]  [place], [place], [address] Phone: [phone]   Fax:  [phone]  Name: Joanna Willis MRN: [id number] Date of Birth: May 24, 1956

## 2017-06-17 ENCOUNTER — Ambulatory Visit: Payer: 59

## 2017-06-17 DIAGNOSIS — M25562 Pain in left knee: Secondary | ICD-10-CM

## 2017-06-17 DIAGNOSIS — R29898 Other symptoms and signs involving the musculoskeletal system: Secondary | ICD-10-CM | POA: Diagnosis not present

## 2017-06-17 DIAGNOSIS — R262 Difficulty in walking, not elsewhere classified: Secondary | ICD-10-CM

## 2017-06-17 DIAGNOSIS — M25662 Stiffness of left knee, not elsewhere classified: Secondary | ICD-10-CM | POA: Diagnosis not present

## 2017-06-17 NOTE — Therapy (Signed)
[facility] [address]  [place], [place], [address] Phone: [phone]   Fax:  [phone]  Physical Therapy Treatment  Patient Details  Name: Joanna Willis MRN: [id number] Date of Birth: January 03, 1956 Referring Provider: Judith Part. Chabon PA-C   Encounter Date: 06/17/2017  PT End of Session - 06/17/17 0855    Visit Number  2    Number of Visits  6    Date for PT Re-Evaluation  07/08/17    PT Start Time  0847    PT Stop Time  0928    PT Time Calculation (min)  41 min    Activity Tolerance  Patient tolerated treatment well    Behavior During Therapy  [facility] for tasks assessed/performed       Past Medical History:  Diagnosis Date  . Depression   . GERD (gastroesophageal reflux disease)   . Hypercholesteremia   . PONV (postoperative nausea and vomiting)   . Postmenopausal   . Wears glasses     Past Surgical History:  Procedure Laterality Date  . ABDOMINAL HYSTERECTOMY  2005   did not remove ovaries  . CESAREAN SECTION  2000  . COLONOSCOPY    . FOOT ARTHRODESIS  1995   right  . KNEE ARTHROSCOPY WITH MEDIAL MENISECTOMY Left 05/17/2014   Procedure: LEFT ARTHROSCOPY KNEE WITH PARTIAL MEDIAL MENISECTOMY, CHONDROPLASTY OF LATERAL FEMORAL CONDYLE;  Surgeon: Alta Corning, MD;  Location: [place];  Service: Orthopedics;  Laterality: Left;  . PARTIAL KNEE ARTHROPLASTY Left 01/21/2017   Procedure: UNICOMPARTMENTAL LEFT KNEE;  Surgeon: [name], MD;  [facility];  Service: Orthopedics;  Laterality: Left;    There were no vitals filed for this visit.  Subjective Assessment - 06/17/17 0851    Subjective  Pt. reporting increased pain today however reports she may have "overdone it" riding bikes last few days.      Patient Stated Goals  improve pain    Currently in Pain?  Yes    Pain Score  2     Pain Location  Knee    Pain Orientation  Left;Medial    Pain Descriptors / Indicators  Sharp     Pain Type  Acute pain    Pain Onset  More than a month ago    Pain Frequency  Intermittent    Aggravating Factors   first few steps after prolonged sitting     Pain Relieving Factors  ice 2x/day     Multiple Pain Sites  No                      [facility] Adult PT Treatment/Exercise - 06/17/17 0858      Knee/Hip Exercises: Aerobic   Nustep  Lvl 4, 6 min      Knee/Hip Exercises: Machines for Strengthening   Cybex Knee Flexion  B con/L ecc: 15# x 15 reps      Knee/Hip Exercises: Standing   Terminal Knee Extension  Left;15 reps;Theraband    Theraband Level (Terminal Knee Extension)  Level 4 (Blue)    Terminal Knee Extension Limitations  band closed in door    Forward Step Up  Left;15 reps;Step Height: 6"    Forward Step Up Limitations  red TB TKE    Step Down  Left;Hand Hold: 1;Step Height: 8";15 reps    Step Down Limitations  machine support     Wall Squat  3 seconds;10 reps  Wall Squat Limitations  ball squeeze       Knee/Hip Exercises: Seated   Long Arc Quad  Left;10 reps    Long Arc Quad Weight  2 lbs.    Long Arc Quad Limitations  adduction ball squeeze       Knee/Hip Exercises: Supine   Straight Leg Raise with External Rotation  Strengthening;Left;15 reps    Straight Leg Raise with External Rotation Limitations  2#       Knee/Hip Exercises: Sidelying   Hip ADduction  Left;15 reps      Modalities   Modalities  Iontophoresis      Iontophoresis   Type of Iontophoresis  Dexamethasone 3#/6; Pt. received patch 2#/6 earlier this week in clinic     Location  L anterior/medial knee    Dose  1.0 mL    Time  4-6 hours; 80 mA                  PT Long Term Goals - 06/17/17 4496      PT LONG TERM GOAL #1   Title  patient to be independent with advanced HEP    Status  On-going      PT LONG TERM GOAL #2   Title  Patient to ambualte with good heel toe mechanics with normal weight shift to L LE with pain no greater than 2/10    Status  On-going       PT LONG TERM GOAL #3   Title  patient to report ability to perform exercise regimen and normal work tasks with pain no greater than 2/10    Status  On-going            Plan - 06/17/17 0921    Clinical Impression Statement  Joanna Willis seen to start treatment with complaint of increased L knee pain however admits, "I may have overdone it riding bikes the last few days".  Therex focused on quad/VMO strengthening for hopeful improvement in patellar mechanics with [place] tolerating all activities well.  Still with worst pain upon rising from seated position and with first couple steps.  Ended with application of iontophoresis patch #3/6 to L medial knee for hopeful pain relief.  Will continue to progress per pt. in coming visits.      PT Treatment/Interventions  ADLs/Self Care Home Management;Cryotherapy;Electrical Stimulation;Iontophoresis 4mg /ml Dexamethasone;Moist Heat;Therapeutic exercise;Therapeutic activities;Functional mobility training;Gait training;Ultrasound;Balance training;Neuromuscular re-education;Patient/family education;Manual techniques;Vasopneumatic Device;Taping;Dry needling;Passive range of motion    Consulted and Agree with Plan of Care  Patient       Patient will benefit from skilled therapeutic intervention in order to improve the following deficits and impairments:  Decreased activity tolerance, Decreased mobility, Difficulty walking, Pain  Visit Diagnosis: Acute pain of left knee  Difficulty in walking, not elsewhere classified  Other symptoms and signs involving the musculoskeletal system  Stiffness of left knee, not elsewhere classified     Problem List Patient Active Problem List   Diagnosis Date Noted  . OA (osteoarthritis) of knee 01/21/2017  . Left knee pain 04/19/2014  . BRONCHITIS, ACUTE 07/21/2010    Bess Harvest, PTA 06/17/17 10:45 AM  [facility] [address]  [place], [place],  [address] Phone: [phone]   Fax:  [phone]  Name: Joanna Willis MRN: [id number] Date of Birth: 01/08/1956

## 2017-06-24 ENCOUNTER — Ambulatory Visit: Payer: 59 | Attending: Physician Assistant

## 2017-06-24 DIAGNOSIS — R29898 Other symptoms and signs involving the musculoskeletal system: Secondary | ICD-10-CM | POA: Diagnosis not present

## 2017-06-24 DIAGNOSIS — R262 Difficulty in walking, not elsewhere classified: Secondary | ICD-10-CM | POA: Diagnosis not present

## 2017-06-24 DIAGNOSIS — M25562 Pain in left knee: Secondary | ICD-10-CM | POA: Diagnosis not present

## 2017-06-24 DIAGNOSIS — M25662 Stiffness of left knee, not elsewhere classified: Secondary | ICD-10-CM | POA: Diagnosis not present

## 2017-06-24 NOTE — Therapy (Signed)
[facility] [address]  [place], [place], [address] Phone: [phone]   Fax:  [phone]  Physical Therapy Treatment  Patient Details  Name: Joanna Willis MRN: [id number] Date of Birth: August 28, 1955 Referring Provider: Judith Part. Chabon PA-C   Encounter Date: 06/24/2017  PT End of Session - 06/24/17 0807    Visit Number  3    Number of Visits  6    Date for PT Re-Evaluation  07/08/17    PT Start Time  0802    PT Stop Time  1660    PT Time Calculation (min)  42 min    Activity Tolerance  Patient tolerated treatment well    Behavior During Therapy  [facility] for tasks assessed/performed       Past Medical History:  Diagnosis Date  . Depression   . GERD (gastroesophageal reflux disease)   . Hypercholesteremia   . PONV (postoperative nausea and vomiting)   . Postmenopausal   . Wears glasses     Past Surgical History:  Procedure Laterality Date  . ABDOMINAL HYSTERECTOMY  2005   did not remove ovaries  . CESAREAN SECTION  2000  . COLONOSCOPY    . FOOT ARTHRODESIS  1995   right  . KNEE ARTHROSCOPY WITH MEDIAL MENISECTOMY Left 05/17/2014   Procedure: LEFT ARTHROSCOPY KNEE WITH PARTIAL MEDIAL MENISECTOMY, CHONDROPLASTY OF LATERAL FEMORAL CONDYLE;  Surgeon: Alta Corning, MD;  Location: [place];  Service: Orthopedics;  Laterality: Left;  . PARTIAL KNEE ARTHROPLASTY Left 01/21/2017   Procedure: UNICOMPARTMENTAL LEFT KNEE;  Surgeon: [name], MD;  [facility];  Service: Orthopedics;  Laterality: Left;    There were no vitals filed for this visit.  Subjective Assessment - 06/24/17 0804    Subjective  Has been having increased L knee pain over holiday without known trigger however reports knee pain improved today.  Pt. able to ride bike on road for 1 hour yesterday.  Reports some L LE tightness in "outside of thigh" today.      Diagnostic tests  xray - negative    Patient Stated Goals   improve pain    Currently in Pain?  Yes    Pain Score  2     Pain Location  Knee    Pain Orientation  Left;Medial    Pain Descriptors / Indicators  Sharp    Pain Type  Acute pain    Pain Onset  More than a month ago    Pain Frequency  Intermittent    Multiple Pain Sites  No         [facility] PT Assessment - 06/24/17 0808      Assessment   Next MD Visit  1.16.19                  [facility] Adult PT Treatment/Exercise - 06/24/17 6301      Ambulation/Gait   Ambulation/Gait  Yes    Ambulation/Gait Assistance  6: Modified independent (Device/Increase time)    Ambulation Distance (Feet)  200 Feet    Assistive device  None    Gait Pattern  Lateral trunk lean to right;Antalgic    Gait Comments  Only with antalgic gait pattern on L knee for first few steps after rising then able to normalize gait following with only slight R trunk lean; cues provided to encourage upright trunk/posture with gait      Knee/Hip Exercises: Stretches   Control and instrumentation engineer  reps;30 seconds    Quad Stretch Limitations  strap and bolster    ITB Stretch  2 reps;Left;30 seconds    ITB Stretch Limitations  supine and sidelying       Knee/Hip Exercises: Aerobic   Stationary Bike  Lvl 2, 6 min      Knee/Hip Exercises: Standing   Forward Step Up  Left;15 reps;Step Height: 6"    Forward Step Up Limitations  red TB anterior, lateral, and medial pull x 5 reps each way     Step Down  Left;Step Height: 8";15 reps;Hand Hold: 2    Step Down Limitations  2 ski poles     Wall Squat  3 seconds;15 reps    Wall Squat Limitations  ball squeeze     SLS with Vectors  L SLS on blue foam oval with opposite LE cone touch 2 x 30 sec; 1 ski pole     Other Standing Knee Exercises  B fitter hip extension (1 blue, 1 black band), abduction (2 blue bands) x 10 reps each; 2 ski poles       Knee/Hip Exercises: Supine   Straight Leg Raises  Left;15 reps    Straight Leg Raises Limitations  2#       Manual Therapy   Manual  Therapy  Soft tissue mobilization;Passive ROM    Manual therapy comments  mod thomas pos    Soft tissue mobilization  L lateral quads, L ITB; mild ttp in L proximal quad; mild ttp over L medial anterior knee     Passive ROM  L manual glute/piriformis, ITB stretch with therapist x 30 sec each way              PT Education - 06/24/17 1313    Education provided  Yes    Education Details  ITB stretch with strap, sidelying ITB stretch, prone RF stretch with strap    Person(s) Educated  Patient    Methods  Explanation;Demonstration;Verbal cues;Handout    Comprehension  Verbalized understanding;Returned demonstration;Verbal cues required;Need further instruction          PT Long Term Goals - 06/17/17 1093      PT LONG TERM GOAL #1   Title  patient to be independent with advanced HEP    Status  On-going      PT LONG TERM GOAL #2   Title  Patient to ambualte with good heel toe mechanics with normal weight shift to L LE with pain no greater than 2/10    Status  On-going      PT LONG TERM GOAL #3   Title  patient to report ability to perform exercise regimen and normal work tasks with pain no greater than 2/10    Status  On-going            Plan - 06/24/17 0807    Clinical Impression Statement  [place] noting increased L knee pain over holiday however reports improvement in comfort while walking today.  Was able to bike for 1 hour on road yesterday without pain.  Reports, "my knee only hurts the first few steps when walking".  Tolerated mild progression in LE strengthening today well however noting limited benefit from ionto patch applied last few visits thus deferred today.  With some complaint today of L LE "outside thigh" tightness thus some manual stretching and STM to this area with good relief noted.  Will monitor response and progress at upcoming visit.      PT Treatment/Interventions  ADLs/Self Care Home Management;Cryotherapy;Electrical Stimulation;Iontophoresis 4mg /ml  Dexamethasone;Moist Heat;Therapeutic exercise;Therapeutic activities;Functional mobility training;Gait training;Ultrasound;Balance training;Neuromuscular re-education;Patient/family education;Manual techniques;Vasopneumatic Device;Taping;Dry needling;Passive range of motion    Consulted and Agree with Plan of Care  Patient       Patient will benefit from skilled therapeutic intervention in order to improve the following deficits and impairments:  Decreased activity tolerance, Decreased mobility, Difficulty walking, Pain  Visit Diagnosis: Acute pain of left knee  Difficulty in walking, not elsewhere classified  Other symptoms and signs involving the musculoskeletal system  Stiffness of left knee, not elsewhere classified     Problem List Patient Active Problem List   Diagnosis Date Noted  . OA (osteoarthritis) of knee 01/21/2017  . Left knee pain 04/19/2014  . BRONCHITIS, ACUTE 07/21/2010    Bess Harvest, PTA 06/24/17 1:14 PM  [facility] [address]  [place], [place], [address] Phone: [phone]   Fax:  [phone]  Name: TANIYAH BALLOW MRN: [id number] Date of Birth: 1955/12/14

## 2017-06-26 ENCOUNTER — Ambulatory Visit: Payer: 59 | Admitting: Physical Therapy

## 2017-06-26 ENCOUNTER — Encounter: Payer: Self-pay | Admitting: Physical Therapy

## 2017-06-26 DIAGNOSIS — M25562 Pain in left knee: Secondary | ICD-10-CM | POA: Diagnosis not present

## 2017-06-26 DIAGNOSIS — R29898 Other symptoms and signs involving the musculoskeletal system: Secondary | ICD-10-CM | POA: Diagnosis not present

## 2017-06-26 DIAGNOSIS — M25662 Stiffness of left knee, not elsewhere classified: Secondary | ICD-10-CM | POA: Diagnosis not present

## 2017-06-26 DIAGNOSIS — R262 Difficulty in walking, not elsewhere classified: Secondary | ICD-10-CM | POA: Diagnosis not present

## 2017-06-26 NOTE — Therapy (Signed)
[facility] [address]  [place], [place], [address] Phone: [phone]   Fax:  [phone]  Physical Therapy Treatment  Patient Details  Name: Joanna Willis MRN: [id number] Date of Birth: 10/01/1955 Referring Provider: Judith Part. Chabon PA-C   Encounter Date: 06/26/2017  PT End of Session - 06/26/17 0807    Visit Number  4    Number of Visits  6    Date for PT Re-Evaluation  07/08/17    PT Start Time  0803    PT Stop Time  0841    PT Time Calculation (min)  38 min    Activity Tolerance  Patient tolerated treatment well    Behavior During Therapy  [facility] for tasks assessed/performed       Past Medical History:  Diagnosis Date  . Depression   . GERD (gastroesophageal reflux disease)   . Hypercholesteremia   . PONV (postoperative nausea and vomiting)   . Postmenopausal   . Wears glasses     Past Surgical History:  Procedure Laterality Date  . ABDOMINAL HYSTERECTOMY  2005   did not remove ovaries  . CESAREAN SECTION  2000  . COLONOSCOPY    . FOOT ARTHRODESIS  1995   right  . KNEE ARTHROSCOPY WITH MEDIAL MENISECTOMY Left 05/17/2014   Procedure: LEFT ARTHROSCOPY KNEE WITH PARTIAL MEDIAL MENISECTOMY, CHONDROPLASTY OF LATERAL FEMORAL CONDYLE;  Surgeon: Alta Corning, MD;  Location: [place];  Service: Orthopedics;  Laterality: Left;  . PARTIAL KNEE ARTHROPLASTY Left 01/21/2017   Procedure: UNICOMPARTMENTAL LEFT KNEE;  Surgeon: [name], MD;  Location: [facility];  Service: Orthopedics;  Laterality: Left;    There were no vitals filed for this visit.  Subjective Assessment - 06/26/17 0807    Subjective  no ill effects from last visit; felt some better at work yesterday - but pain continues with transitions - worse the longer she sits    Diagnostic tests  xray - negative    Patient Stated Goals  improve pain    Currently in Pain?  Yes    Pain Score  2     Pain Location  Knee    Pain  Orientation  Left;Medial                      [facility] Adult PT Treatment/Exercise - 06/26/17 0001      Knee/Hip Exercises: Stretches   Other Knee/Hip Stretches  L adductor stretch 3 x 60 seconds      Knee/Hip Exercises: Aerobic   Nustep  L5 x 6 min      Knee/Hip Exercises: Sidelying   Hip ADduction  Left;15 reps      Modalities   Modalities  Iontophoresis      Iontophoresis   Type of Iontophoresis  Dexamethasone    Location  L anterior/medial knee    Dose  1.0 mL    Time  4-6 hours; 80 mA      Manual Therapy   Manual Therapy  Soft tissue mobilization;Joint mobilization    Manual therapy comments  patient supine    Joint Mobilization  patellar mobs - all planes with good mobility; AP/PA mobs at knee joint with no issue    Soft tissue mobilization  STM to lateral and medial thigh. Much time spent on adductor group as this area was most tender and painful  PT Long Term Goals - 06/17/17 7858      PT LONG TERM GOAL #1   Title  patient to be independent with advanced HEP    Status  On-going      PT LONG TERM GOAL #2   Title  Patient to ambualte with good heel toe mechanics with normal weight shift to L LE with pain no greater than 2/10    Status  On-going      PT LONG TERM GOAL #3   Title  patient to report ability to perform exercise regimen and normal work tasks with pain no greater than 2/10    Status  On-going            Plan - 06/26/17 0843    Clinical Impression Statement  Patient under increased stress from home life. Patient with reports of contiued pain and discomfort at L knee with transfers most troublesome. Did conitnue ionto patch today - patient wishing to try this 1x more. STM revealing much tenderness at L medial thigh/adductor group with much time spent on manual therapy at this area for hopeful reduction in pain. Will continue to progress towards goals as patient is able.     PT Treatment/Interventions   ADLs/Self Care Home Management;Cryotherapy;Electrical Stimulation;Iontophoresis 4mg /ml Dexamethasone;Moist Heat;Therapeutic exercise;Therapeutic activities;Functional mobility training;Gait training;Ultrasound;Balance training;Neuromuscular re-education;Patient/family education;Manual techniques;Vasopneumatic Device;Taping;Dry needling;Passive range of motion    Consulted and Agree with Plan of Care  Patient       Patient will benefit from skilled therapeutic intervention in order to improve the following deficits and impairments:  Decreased activity tolerance, Decreased mobility, Difficulty walking, Pain  Visit Diagnosis: Acute pain of left knee  Difficulty in walking, not elsewhere classified  Other symptoms and signs involving the musculoskeletal system     Problem List Patient Active Problem List   Diagnosis Date Noted  . OA (osteoarthritis) of knee 01/21/2017  . Left knee pain 04/19/2014  . BRONCHITIS, ACUTE 07/21/2010     Lanney Gins, PT, DPT 06/26/17 10:39 AM   [facility] [address]  [place], [place], [address] Phone: [phone]   Fax:  [phone]  Name: Joanna Willis MRN: [id number] Date of Birth: August 09, 1955

## 2017-07-01 ENCOUNTER — Encounter: Payer: Self-pay | Admitting: Physical Therapy

## 2017-07-01 ENCOUNTER — Ambulatory Visit: Payer: 59 | Admitting: Physical Therapy

## 2017-07-01 DIAGNOSIS — M25562 Pain in left knee: Secondary | ICD-10-CM

## 2017-07-01 DIAGNOSIS — M25662 Stiffness of left knee, not elsewhere classified: Secondary | ICD-10-CM | POA: Diagnosis not present

## 2017-07-01 DIAGNOSIS — R29898 Other symptoms and signs involving the musculoskeletal system: Secondary | ICD-10-CM | POA: Diagnosis not present

## 2017-07-01 DIAGNOSIS — R262 Difficulty in walking, not elsewhere classified: Secondary | ICD-10-CM | POA: Diagnosis not present

## 2017-07-01 NOTE — Therapy (Signed)
[facility] [address]  [place], [place], [address] Phone: [phone]   Fax:  [phone]  Physical Therapy Treatment  Patient Details  Name: Joanna Willis MRN: [id number] Date of Birth: [birth year] Referring Provider: Judith Part. Chabon PA-C   Encounter Date: 07/01/2017  PT End of Session - 07/01/17 0810    Visit Number  5    Number of Visits  6    Date for PT Re-Evaluation  07/08/17    PT Start Time  0802    PT Stop Time  0841    PT Time Calculation (min)  39 min    Activity Tolerance  Patient tolerated treatment well    Behavior During Therapy  [facility] for tasks assessed/performed       Past Medical History:  Diagnosis Date  . Depression   . GERD (gastroesophageal reflux disease)   . Hypercholesteremia   . PONV (postoperative nausea and vomiting)   . Postmenopausal   . Wears glasses     Past Surgical History:  Procedure Laterality Date  . ABDOMINAL HYSTERECTOMY  2005   did not remove ovaries  . CESAREAN SECTION  2000  . COLONOSCOPY    . FOOT ARTHRODESIS  1995   right  . KNEE ARTHROSCOPY WITH MEDIAL MENISECTOMY Left 05/17/2014   Procedure: LEFT ARTHROSCOPY KNEE WITH PARTIAL MEDIAL MENISECTOMY, CHONDROPLASTY OF LATERAL FEMORAL CONDYLE;  Surgeon: Alta Corning, MD;  Location: [place];  Service: Orthopedics;  Laterality: Left;  . PARTIAL KNEE ARTHROPLASTY Left 01/21/2017   Procedure: UNICOMPARTMENTAL LEFT KNEE;  Surgeon: [name], MD;  Location: [facility];  Service: Orthopedics;  Laterality: Left;    There were no vitals filed for this visit.  Subjective Assessment - 07/01/17 0805    Subjective  noticing an improvement in symptoms since last visit - found another sore spot lower on her leg    Diagnostic tests  xray - negative    Patient Stated Goals  improve pain    Currently in Pain?  No/denies    Pain Score  0-No pain                      [facility] Adult PT  Treatment/Exercise - 07/01/17 0809      Knee/Hip Exercises: Stretches   Gastroc Stretch  Left;3 reps;30 seconds prostretch      Knee/Hip Exercises: Aerobic   Recumbent Bike  L2 x 6 min      Manual Therapy   Manual Therapy  Soft tissue mobilization;Joint mobilization    Manual therapy comments  patient supine    Joint Mobilization  patellar mobs - all planes    Soft tissue mobilization  STM to lower leg - near anterior/medial joint line, superior/medial gastroc with patient very tender/sore; improved tissue quality noted throughout muscle belly of quad mm with less tenderness                  PT Long Term Goals - 06/17/17 6962      PT LONG TERM GOAL #1   Title  patient to be independent with advanced HEP    Status  On-going      PT LONG TERM GOAL #2   Title  Patient to ambualte with good heel toe mechanics with normal weight shift to L LE with pain no greater than 2/10    Status  On-going      PT LONG TERM GOAL #3  Title  patient to report ability to perform exercise regimen and normal work tasks with pain no greater than 2/10    Status  On-going            Plan - 07/01/17 0841    Clinical Impression Statement  Noticed a slight change in symptoms following last session - heavy work independently on stretching/rolling quads and adductors with subjective reports of less pain. Heavy manual continued today to L lower leg at medial knee. Education to also incorporate gastroc stretch as well as rolling of superior/medial gastroc with good carryover. Will assess patient response at next visit.     PT Treatment/Interventions  ADLs/Self Care Home Management;Cryotherapy;Electrical Stimulation;Iontophoresis 4mg /ml Dexamethasone;Moist Heat;Therapeutic exercise;Therapeutic activities;Functional mobility training;Gait training;Ultrasound;Balance training;Neuromuscular re-education;Patient/family education;Manual techniques;Vasopneumatic Device;Taping;Dry needling;Passive range of  motion    Consulted and Agree with Plan of Care  Patient       Patient will benefit from skilled therapeutic intervention in order to improve the following deficits and impairments:  Decreased activity tolerance, Decreased mobility, Difficulty walking, Pain  Visit Diagnosis: Acute pain of left knee  Difficulty in walking, not elsewhere classified  Other symptoms and signs involving the musculoskeletal system     Problem List Patient Active Problem List   Diagnosis Date Noted  . OA (osteoarthritis) of knee 01/21/2017  . Left knee pain 04/19/2014  . BRONCHITIS, ACUTE 07/21/2010     Lanney Gins, PT, DPT 07/01/17 9:47 AM   [facility] [address]  [place], [place], [address] Phone: [phone]   Fax:  [phone]  Name: Joanna Willis MRN: [id number] Date of Birth: 05-31-1956

## 2017-07-03 ENCOUNTER — Ambulatory Visit: Payer: 59

## 2017-07-03 DIAGNOSIS — R29898 Other symptoms and signs involving the musculoskeletal system: Secondary | ICD-10-CM

## 2017-07-03 DIAGNOSIS — R262 Difficulty in walking, not elsewhere classified: Secondary | ICD-10-CM | POA: Diagnosis not present

## 2017-07-03 DIAGNOSIS — M25562 Pain in left knee: Secondary | ICD-10-CM

## 2017-07-03 DIAGNOSIS — M25662 Stiffness of left knee, not elsewhere classified: Secondary | ICD-10-CM | POA: Diagnosis not present

## 2017-07-03 NOTE — Therapy (Addendum)
[facility] [address]  [place], [place], [address] Phone: [phone]   Fax:  [phone]  Physical Therapy Treatment  Patient Details  Name: Joanna Willis MRN: [id number] Date of Birth: 01-Aug-1955 Referring Provider: Judith Part. Chabon PA-C   Encounter Date: 07/03/2017  PT End of Session - 07/03/17 0834    Visit Number  6    Number of Visits  6    Date for PT Re-Evaluation  07/08/17    PT Start Time  0802    PT Stop Time  0852    PT Time Calculation (min)  50 min    Activity Tolerance  Patient tolerated treatment well    Behavior During Therapy  [facility] for tasks assessed/performed       Past Medical History:  Diagnosis Date  . Depression   . GERD (gastroesophageal reflux disease)   . Hypercholesteremia   . PONV (postoperative nausea and vomiting)   . Postmenopausal   . Wears glasses     Past Surgical History:  Procedure Laterality Date  . ABDOMINAL HYSTERECTOMY  2005   did not remove ovaries  . CESAREAN SECTION  2000  . COLONOSCOPY    . FOOT ARTHRODESIS  1995   right  . KNEE ARTHROSCOPY WITH MEDIAL MENISECTOMY Left 05/17/2014   Procedure: LEFT ARTHROSCOPY KNEE WITH PARTIAL MEDIAL MENISECTOMY, CHONDROPLASTY OF LATERAL FEMORAL CONDYLE;  Surgeon: Alta Corning, MD;  Location: [place];  Service: Orthopedics;  Laterality: Left;  . PARTIAL KNEE ARTHROPLASTY Left 01/21/2017   Procedure: UNICOMPARTMENTAL LEFT KNEE;  Surgeon: [name], MD;  Location: [facility];  Service: Orthopedics;  Laterality: Left;    There were no vitals filed for this visit.  Subjective Assessment - 07/03/17 0805    Subjective  Reports only slight improve in knee pain for short duration following last visit.  Reports, "my knee pain is about the same today".  Does report some reduction in severity of pain within first couple of steps upon rising from sitting position.      Diagnostic tests  xray - negative    Patient  Stated Goals  improve pain    Currently in Pain?  Yes    Pain Score  3  Rising up to 7/10 first few steps upon standing to walk    Pain Location  Knee    Pain Orientation  Left;Medial    Pain Descriptors / Indicators  Sharp    Pain Type  Acute pain    Pain Onset  More than a month ago    Pain Frequency  Intermittent    Aggravating Factors   first few steps after prolonged sitting     Multiple Pain Sites  No         [facility] PT Assessment - 07/03/17 0911      Observation/Other Assessments   Focus on Therapeutic Outcomes (FOTO)   52% (48% limitation)      AROM   AROM Assessment Site  Knee    Right/Left Knee  Left    Left Knee Extension  0    Left Knee Flexion  129      Strength   Strength Assessment Site  --    Right/Left Hip  --    Right/Left Knee  --                  [facility] Adult PT Treatment/Exercise - 07/03/17 1655      Knee/Hip Exercises: Stretches  Gastroc Stretch  Left;3 reps;60 seconds    Other Knee/Hip Stretches  L adductor stretch 3 x 60 seconds      Knee/Hip Exercises: Aerobic   Recumbent Bike  L2 x 6 min      Modalities   Modalities  Iontophoresis      Iontophoresis   Type of Iontophoresis  Dexamethasone    Location  L posterior/medial knee over point tender area     Dose  1.0 mL    Time  4-6 hours; 80 mA      Manual Therapy   Manual Therapy  Joint mobilization    Manual therapy comments  patient supine    Soft tissue mobilization  STM to anterior medial gastroc over area of tenderness (tender here), medial distal HS (some tenderness), superior/medial gastroc belly, adductors (some remaining tenderness); Reduced tenderness in anterior medial gastroc and adductors following STM however no improvement in tenderness in posterior/medial knee over     Passive ROM  Manual L HS, adductors, gastroc stretch x 20 sec each                   PT Long Term Goals - 07/03/17 6387      PT LONG TERM GOAL #1   Title  patient to be independent  with advanced HEP    Status  Partially Met met for current       PT LONG TERM GOAL #2   Title  Patient to ambualte with good heel toe mechanics with normal weight shift to L LE with pain no greater than 2/10    Status  On-going Pain up to 7/10 with first couple of steps ambulating after prolonged sitting with antalgic gait pattern       PT LONG TERM GOAL #3   Title  patient to report ability to perform exercise regimen and normal work tasks with pain no greater than 2/10    Status  On-going Pt. able to bike 30-60 min "Pain free" however still with pain while ambulating.  Still up to 5/10 with work tasks.            Plan - 07/03/17 0919    Clinical Impression Statement  Reports, "my pain is about the same today".  Notes somewhat increased L knee pain last few days upon first couple of steps after rising, which subsides after this.  Does note some improvement in pain intensity since start of therapy.  Was able to bike 30-60 min over this past week without knee pain.  Is having pain while walking during work shift, which she reports, "makes me limp occasionally".  Pt. noting improvement in tenderness in adductor group since last visit however TTP in medial/superior gastroc and very point tender over posterior/medial knee.  Limited relief with LE STM today and pt. demonstrating antalgic gait and reporting high pain levels at medial knee first few steps upon rising in session today.  Ionto patch applied to posterior/medial knee upon pt. request for hopeful pain relief in this area.  Seeing MD for f/u this upcoming week and wishes to wait until after f/u to schedule more therapy.      PT Treatment/Interventions  ADLs/Self Care Home Management;Cryotherapy;Electrical Stimulation;Iontophoresis 23m/ml Dexamethasone;Moist Heat;Therapeutic exercise;Therapeutic activities;Functional mobility training;Gait training;Ultrasound;Balance training;Neuromuscular re-education;Patient/family education;Manual  techniques;Vasopneumatic Device;Taping;Dry needling;Passive range of motion    Consulted and Agree with Plan of Care  Patient       Patient will benefit from skilled therapeutic intervention in order to improve the following deficits  and impairments:  Decreased activity tolerance, Decreased mobility, Difficulty walking, Pain  Visit Diagnosis: Acute pain of left knee  Difficulty in walking, not elsewhere classified  Other symptoms and signs involving the musculoskeletal system     Problem List Patient Active Problem List   Diagnosis Date Noted  . OA (osteoarthritis) of knee 01/21/2017  . Left knee pain 04/19/2014  . BRONCHITIS, ACUTE 07/21/2010    Bess Harvest, PTA 07/03/17 12:39 PM    PHYSICAL THERAPY DISCHARGE SUMMARY  Visits from Start of Care: 6  Current functional level related to goals / functional outcomes: See above   Remaining deficits: See above; continued knee pain with initial transfers and walking    Education / Equipment: HEP  Plan: Patient agrees to discharge.  Patient goals were partially met. Patient is being discharged due to                                                     ?????    Patient was to follow-up with referring provider prior to returning to PT. Will gladly see patient in the future for any other needs.   Lanney Gins, PT, DPT 08/04/17 10:02 AM  [facility] [address]  [place], [place], [address] Phone: [phone]   Fax:  [phone]  Name: LASHARN BUFKIN MRN: [id number] Date of Birth: 09-06-1955

## 2017-07-08 DIAGNOSIS — Z96659 Presence of unspecified artificial knee joint: Secondary | ICD-10-CM | POA: Diagnosis not present

## 2017-07-08 DIAGNOSIS — Z471 Aftercare following joint replacement surgery: Secondary | ICD-10-CM | POA: Diagnosis not present

## 2017-07-08 DIAGNOSIS — M1712 Unilateral primary osteoarthritis, left knee: Secondary | ICD-10-CM | POA: Insufficient documentation

## 2017-07-08 MED FILL — MELOXICAM 7.5 MG TABLET: 7.5 | 14 days supply | Qty: 14 | Fill #0

## 2017-07-08 MED FILL — ESTRADIOL 0.05 MG PATCH: 0.05 | 84 days supply | Qty: 24 | Fill #0

## 2017-07-23 MED FILL — OMEPRAZOLE 20 MG CAP: 20 | 90 days supply | Qty: 90 | Fill #1

## 2017-07-23 MED FILL — CITALOPRAM HBR 20 MG TABLET: 20 | 90 days supply | Qty: 90 | Fill #1

## 2017-07-29 DIAGNOSIS — F4321 Adjustment disorder with depressed mood: Secondary | ICD-10-CM | POA: Diagnosis not present

## 2017-08-07 DIAGNOSIS — M7052 Other bursitis of knee, left knee: Secondary | ICD-10-CM | POA: Diagnosis not present

## 2017-08-07 DIAGNOSIS — Z96652 Presence of left artificial knee joint: Secondary | ICD-10-CM | POA: Diagnosis not present

## 2017-08-12 DIAGNOSIS — F4321 Adjustment disorder with depressed mood: Secondary | ICD-10-CM | POA: Diagnosis not present

## 2017-08-21 DIAGNOSIS — M1712 Unilateral primary osteoarthritis, left knee: Secondary | ICD-10-CM | POA: Diagnosis not present

## 2017-08-21 DIAGNOSIS — Z96652 Presence of left artificial knee joint: Secondary | ICD-10-CM | POA: Diagnosis not present

## 2017-08-25 ENCOUNTER — Other Ambulatory Visit (HOSPITAL_COMMUNITY): Payer: Self-pay | Admitting: Orthopedic Surgery

## 2017-08-25 DIAGNOSIS — Z96652 Presence of left artificial knee joint: Secondary | ICD-10-CM

## 2017-08-26 DIAGNOSIS — F4321 Adjustment disorder with depressed mood: Secondary | ICD-10-CM | POA: Diagnosis not present

## 2017-09-02 ENCOUNTER — Encounter (HOSPITAL_COMMUNITY)
Admission: RE | Admit: 2017-09-02 | Discharge: 2017-09-02 | Disposition: A | Discharge status: Home / Self Care | Payer: 59 | Source: Ambulatory Visit | Attending: Orthopedic Surgery | Admitting: Orthopedic Surgery

## 2017-09-02 DIAGNOSIS — M25562 Pain in left knee: Secondary | ICD-10-CM | POA: Insufficient documentation

## 2017-09-02 DIAGNOSIS — Z96652 Presence of left artificial knee joint: Secondary | ICD-10-CM | POA: Diagnosis not present

## 2017-09-02 MED ORDER — TECHNETIUM TC 99M MEDRONATE IV KIT
20.4000 | PACK | Freq: Once | INTRAVENOUS | Status: AC | PRN
  Administered 2017-09-02: 20.4 via INTRAVENOUS

## 2017-09-08 MED FILL — FLUTICASONE PROP 50 MCG SPR: 50 | 30 days supply | Qty: 16 | Fill #1

## 2017-09-11 DIAGNOSIS — Z96652 Presence of left artificial knee joint: Secondary | ICD-10-CM | POA: Diagnosis not present

## 2017-09-11 DIAGNOSIS — M1712 Unilateral primary osteoarthritis, left knee: Secondary | ICD-10-CM | POA: Diagnosis not present

## 2017-09-16 MED FILL — ATORVASTATIN 20 MG TABLET: 20 | 90 days supply | Qty: 90 | Fill #0

## 2017-10-08 DIAGNOSIS — M25562 Pain in left knee: Secondary | ICD-10-CM | POA: Diagnosis not present

## 2017-10-26 MED FILL — OMEPRAZOLE 20 MG CAP: 20 | 90 days supply | Qty: 90 | Fill #0 | Status: TO

## 2017-11-13 DIAGNOSIS — M1712 Unilateral primary osteoarthritis, left knee: Secondary | ICD-10-CM | POA: Diagnosis not present

## 2017-11-13 MED FILL — ESTRADIOL 0.05 MG PATCH: 0.05 | 84 days supply | Qty: 24 | Fill #0

## 2017-12-11 DIAGNOSIS — M25562 Pain in left knee: Secondary | ICD-10-CM | POA: Diagnosis not present

## 2017-12-11 MED FILL — ATORVASTATIN 20 MG TABLET: 20 | 90 days supply | Qty: 90 | Fill #0

## 2017-12-22 NOTE — H&P (Signed)
TOTAL KNEE ARTHROPLASTY ADMISSION H&P  Patient is being admitted for left revision unicompartmental to total knee arthroplasty.  Subjective:  Chief Complaint:left knee pain.  HPI: Joanna Willis, 62 y.o. female, has a history of pain and functional disability in the left knee(s) due to failed previous medial compartmental arthroplasty and patient has failed non-surgical conservative treatments for greater than 12 weeks to include NSAID's and/or analgesics, flexibility and strengthening excercises and use of assistive devices. The indications for the revision of the total knee arthroplasty are failure of all non-operative conservative treatments. Onset of symptoms was abrupt starting approximately two months after unicompartmental arthroplasty in 01/2017 with gradually worsening course since that time.  Prior procedures on the left knee(s) include unicompartmental arthroplasty.  Patient currently rates pain in the left knee(s) at 5 out of 10 with activity. There is night pain, worsening of pain with activity and weight bearing and pain that interferes with activities of daily living.  Patient has evidence of the uni-compartmental prosthesis in excellent position with no definitive abnormalities by imaging studies. This condition presents safety issues increasing the risk of falls. There is no current active infection.  Patient Active Problem List   Diagnosis Date Noted  . OA (osteoarthritis) of knee 01/21/2017  . Left knee pain 04/19/2014  . BRONCHITIS, ACUTE 07/21/2010   Past Medical History:  Diagnosis Date  . Depression   . GERD (gastroesophageal reflux disease)   . Hypercholesteremia   . PONV (postoperative nausea and vomiting)   . Postmenopausal   . Wears glasses     Past Surgical History:  Procedure Laterality Date  . ABDOMINAL HYSTERECTOMY  2005   did not remove ovaries  . CESAREAN SECTION  2000  . COLONOSCOPY    . FOOT ARTHRODESIS  1995   right  . KNEE ARTHROSCOPY WITH MEDIAL  MENISECTOMY Left 05/17/2014   Procedure: LEFT ARTHROSCOPY KNEE WITH PARTIAL MEDIAL MENISECTOMY, CHONDROPLASTY OF LATERAL FEMORAL CONDYLE;  Surgeon: Alta Corning, MD;  Location: [facility];  Service: Orthopedics;  Laterality: Left;  . PARTIAL KNEE ARTHROPLASTY Left 01/21/2017   Procedure: UNICOMPARTMENTAL LEFT KNEE;  Surgeon: [name], MD;  Location: [facility];  Service: Orthopedics;  Laterality: Left;    No current facility-administered medications for this encounter.    Current Outpatient Medications  Medication Sig Dispense Refill Last Dose  . acetaminophen (TYLENOL) 500 MG tablet Take 1,000 mg by mouth every 6 (six) hours as needed.   Taking  . atorvastatin (LIPITOR) 10 MG tablet Take 10 mg by mouth at bedtime.    Taking  . benzonatate (TESSALON) 200 MG capsule Take one cap by mouth at bedtime as needed for cough.  May repeat in 4 to 6 hours (Patient not taking: Reported on 01/12/2017) 15 capsule 0 Not Taking  . citalopram (CELEXA) 20 MG tablet Take 20 mg by mouth at bedtime.    Taking  . fluticasone (FLONASE) 50 MCG/ACT nasal spray USE 1 SPRAY IN EACH NOSTRIL 2 TIMES A DAY 16 g 2 Taking  . methocarbamol (ROBAXIN) 500 MG tablet Take 1 tablet (500 mg total) by mouth every 6 (six) hours as needed for muscle spasms. (Patient not taking: Reported on 06/10/2017) 80 tablet 0 Not Taking  . omeprazole (PRILOSEC) 20 MG capsule Take 20 mg by mouth daily.   Taking  . oxyCODONE (OXY IR/ROXICODONE) 5 MG immediate release tablet Take 1-2 tablets (5-10 mg total) by mouth every 4 (four) hours as needed for moderate pain or severe pain. (Patient not  taking: Reported on 06/10/2017) 80 tablet 0 Not Taking  . rivaroxaban (XARELTO) 10 MG TABS tablet Take 1 tablet (10 mg total) by mouth daily with breakfast. Xarelto 10 mg daily for ten days, then change to Aspirin 325 mg daily for two weeks, then reduce to Baby Aspirin 81 mg daily for three additional weeks. (Patient not taking: Reported on 06/10/2017)  10 tablet 0 Not Taking   Allergies  Allergen Reactions  . Penicillins Rash    As a child. Has patient had a PCN reaction causing immediate rash, facial/tongue/throat swelling, SOB or lightheadedness with hypotension: Unknown Has patient had a PCN reaction causing severe rash involving mucus membranes or skin necrosis: Unknown Has patient had a PCN reaction that required hospitalization: Unknown Has patient had a PCN reaction occurring within the last 10 years: Unknown If all of the above answers are "NO", then may proceed with Cephalosporin use.     Social History   Tobacco Use  . Smoking status: Never Smoker  . Smokeless tobacco: Never Used  Substance Use Topics  . Alcohol use: Yes    Alcohol/week: 3.0 oz    Types: 5 Cans of beer per week    Family History  Problem Relation Age of Onset  . Colon cancer Mother 56       lived 6 months after diagnosis  . Pancreatic cancer Neg Hx   . Stomach cancer Neg Hx       Review of Systems  Constitutional: Negative for chills and fever.  HENT: Negative for congestion, sore throat and tinnitus.   Eyes: Negative for double vision, photophobia and pain.  Respiratory: Negative for cough, shortness of breath and wheezing.   Cardiovascular: Negative for chest pain, palpitations and orthopnea.  Gastrointestinal: Negative for heartburn, nausea and vomiting.  Genitourinary: Negative for dysuria, frequency and urgency.  Musculoskeletal: Positive for joint pain.  Neurological: Negative for dizziness, weakness and headaches.  Psychiatric/Behavioral: Negative for depression.     Objective:  Physical Exam  Well nourished and well developed. General: Alert and oriented x3, cooperative and pleasant, no acute distress. Head: normocephalic, atraumatic, neck supple. Eyes: EOMI. Respiratory: breath sounds clear in all fields, no wheezing, rales, or rhonchi. Cardiovascular: Regular rate and rhythm, no murmurs, gallops or rubs.  Abdomen: non-tender  to palpation and soft, normoactive bowel sounds. Musculoskeletal: Antalgic gait on the left and walking without assistance of a cane. Left knee exam:  No swelling. Well healed incisions, dry, no drainage and without tenderness. Range of motion: 0 - 130 degrees. She is significantly tender medially along the tibia. There is no lateral tenderness. Knee is stable. Calves soft and nontender. Motor function intact in LE. Strength 5/5 LE bilaterally. Neuro: Distal pulses 2+. Sensation to light touch intact in LE.  Vital signs in last 24 hours: Blood pressure: 120/84 mmHg Pulse: 68 bpm  Labs:  Estimated body mass index is 25.18 kg/m as calculated from the following:   Height as of 01/21/17: 5\' 6"  (1.676 m).   Weight as of 01/21/17: 70.8 kg (156 lb).  Imaging Review Plain radiographs demonstrate the uni-compartmental prosthesis in excellent position with no definitive abnormalities. The overall alignment is neutral.There is no evidence of loosening of the femoral, tibial and patellar components. The bone quality appears to be adequate for age and reported activity level.   Preoperative templating of the joint replacement has been completed, documented, and submitted to the Operating Room personnel in order to optimize intra-operative equipment management.   Assessment/Plan:  End stage  arthritis, left knee(s) with failed previous unicompartmental arthroplasty.   The patient history, physical examination, clinical judgment of the provider and imaging studies are consistent with end stage degenerative joint disease of the left knee(s), previous total knee arthroplasty. Revision total knee arthroplasty is deemed medically necessary. The treatment options including medical management, injection therapy, arthroscopy and revision arthroplasty were discussed at length. The risks and benefits of revision total knee arthroplasty were presented and reviewed. The risks due to aseptic loosening, infection,  stiffness, patella tracking problems, thromboembolic complications and other imponderables were discussed. The patient acknowledged the explanation, agreed to proceed with the plan and consent was signed. Patient is being admitted for inpatient treatment for surgery, pain control, PT, OT, prophylactic antibiotics, VTE prophylaxis, progressive ambulation and ADL's and discharge planning.The patient is planning to be discharged home with outpatient physical therapy.   Therapy Plans: outpatient therapy at [facility] Disposition: Home with husband Planned DVT Prophylaxis: xarelto 10 mg daily (patient preference) DME needed: None PCP: Dr. Doug Sou ([facility], [place]) TXA: IV Allergies: PCN Other: Patient does not have medical clearance yet, clearance form provided to patient to take to Dr. Suzy Bouchard with [facility].   - Patient was instructed on what medications to stop prior to surgery. - Follow-up visit in 2 weeks with Dr. Wynelle Link - Begin physical therapy following surgery - Pre-operative lab work as pre-surgical testing - Prescriptions will be provided in hospital at time of discharge  Theresa Duty, PA-C Orthopedic Surgery [facility] [place]

## 2018-01-01 NOTE — Patient Instructions (Signed)
Your procedure is scheduled on: Wednesday, January 13, 2018   Surgery Time:  2:45PM-4:45PM   Report to [place]  Entrance    Report to admitting at 12:15 PM   Call this number if you have problems the morning of surgery [phone]   Do not eat food:After Midnight.   Do NOT smoke after Midnight   May have liquids until 8:30AM morning of sugery      CLEAR LIQUID DIET   Foods Allowed                                                                     Foods Excluded  Coffee and tea, regular and decaf                             liquids that you cannot  Plain Jell-O in any flavor                                             see through such as: Fruit ices (not with fruit pulp)                                     milk, soups, orange juice  Iced Popsicles                                    All solid food Carbonated beverages, regular and diet                                    Cranberry, grape and apple juices Sports drinks like Gatorade Lightly seasoned clear broth or consume(fat free) Sugar, honey syrup  Sample Menu Breakfast                                Lunch                                     Supper Cranberry juice                    Beef broth                            Chicken broth Jell-O                                     Grape juice                           Apple juice Coffee or tea  Jell-O                                      Popsicle                                                Coffee or tea                        Coffee or tea   Take these medicines the morning of surgery with A SIP OF WATER: Omeprazole, Flonase if needed                               You may not have any metal on your body including hair pins, jewelry, and body piercings             Do not wear make-up, lotions, powders, perfumes/cologne, or deodorant             Do not wear nail polish.  Do not shave  48 hours prior to surgery.                Do  not bring valuables to the hospital. [place].   Contacts, dentures or bridgework may not be worn into surgery.   Leave suitcase in the car. After surgery it may be brought to your room.   Special Instructions: Bring a copy of your healthcare power of attorney and living will documents         the day of surgery if you haven't scanned them in before.              Please read over the following fact sheets you were given:  [facility] - Preparing for Surgery Before surgery, you can play an important role.  Because skin is not sterile, your skin needs to be as free of germs as possible.  You can reduce the number of germs on your skin by washing with CHG (chlorahexidine gluconate) soap before surgery.  CHG is an antiseptic cleaner which kills germs and bonds with the skin to continue killing germs even after washing. Please DO NOT use if you have an allergy to CHG or antibacterial soaps.  If your skin becomes reddened/irritated stop using the CHG and inform your nurse when you arrive at Short Stay. Do not shave (including legs and underarms) for at least 48 hours prior to the first CHG shower.  You may shave your face/neck.  Please follow these instructions carefully:  1.  Shower with CHG Soap the night before surgery and the  morning of surgery.  2.  If you choose to wash your hair, wash your hair first as usual with your normal  shampoo.  3.  After you shampoo, rinse your hair and body thoroughly to remove the shampoo.                             4.  Use CHG as you would any other liquid soap.  You can apply chg directly to the skin and wash.  Gently with a scrungie or clean washcloth.  5.  Apply the CHG Soap to your body ONLY FROM THE NECK DOWN.   Do   not use on face/ open                           Wound or open sores. Avoid contact with eyes, ears mouth and   genitals (private parts).                       Wash face,  Genitals (private  parts) with your normal soap.             6.  Wash thoroughly, paying special attention to the area where your    surgery  will be performed.  7.  Thoroughly rinse your body with warm water from the neck down.  8.  DO NOT shower/wash with your normal soap after using and rinsing off the CHG Soap.                9.  Pat yourself dry with a clean towel.            10.  Wear clean pajamas.            11.  Place clean sheets on your bed the night of your first shower and do not  sleep with pets. Day of Surgery : Do not apply any lotions/deodorants the morning of surgery.  Please wear clean clothes to the hospital/surgery center.  FAILURE TO FOLLOW THESE INSTRUCTIONS MAY RESULT IN THE CANCELLATION OF YOUR SURGERY  PATIENT SIGNATURE_________________________________  NURSE SIGNATURE__________________________________  ________________________________________________________________________   Adam Phenix  An incentive spirometer is a tool that can help keep your lungs clear and active. This tool measures how well you are filling your lungs with each breath. Taking long deep breaths may help reverse or decrease the chance of developing breathing (pulmonary) problems (especially infection) following:  A long period of time when you are unable to move or be active. BEFORE THE PROCEDURE   If the spirometer includes an indicator to show your best effort, your nurse or respiratory therapist will set it to a desired goal.  If possible, sit up straight or lean slightly forward. Try not to slouch.  Hold the incentive spirometer in an upright position. INSTRUCTIONS FOR USE  1. Sit on the edge of your bed if possible, or sit up as far as you can in bed or on a chair. 2. Hold the incentive spirometer in an upright position. 3. Breathe out normally. 4. Place the mouthpiece in your mouth and seal your lips tightly around it. 5. Breathe in slowly and as deeply as possible, raising the piston or  the ball toward the top of the column. 6. Hold your breath for 3-5 seconds or for as long as possible. Allow the piston or ball to fall to the bottom of the column. 7. Remove the mouthpiece from your mouth and breathe out normally. 8. Rest for a few seconds and repeat Steps 1 through 7 at least 10 times every 1-2 hours when you are awake. Take your time and take a few normal breaths between deep breaths. 9. The spirometer may include an indicator to show your best effort. Use the indicator as a goal to work toward during each repetition. 10. After each set of 10 deep breaths, practice coughing to be sure your lungs are clear. If you have an incision (  the cut made at the time of surgery), support your incision when coughing by placing a pillow or rolled up towels firmly against it. Once you are able to get out of bed, walk around indoors and cough well. You may stop using the incentive spirometer when instructed by your caregiver.  RISKS AND COMPLICATIONS  Take your time so you do not get dizzy or light-headed.  If you are in pain, you may need to take or ask for pain medication before doing incentive spirometry. It is harder to take a deep breath if you are having pain. AFTER USE  Rest and breathe slowly and easily.  It can be helpful to keep track of a log of your progress. Your caregiver can provide you with a simple table to help with this. If you are using the spirometer at home, follow these instructions: [place] IF:   You are having difficultly using the spirometer.  You have trouble using the spirometer as often as instructed.  Your pain medication is not giving enough relief while using the spirometer.  You develop fever of 100.5 F (38.1 C) or higher. SEEK IMMEDIATE MEDICAL CARE IF:   You cough up bloody sputum that had not been present before.  You develop fever of 102 F (38.9 C) or greater.  You develop worsening pain at or near the incision site. MAKE SURE  YOU:   Understand these instructions.  Will watch your condition.  Will get help right away if you are not doing well or get worse. Document Released: 10/20/2006 Document Revised: 09/01/2011 Document Reviewed: 12/21/2006 Exit[employer] Patient Information 2014 [employer], [place].   ________________________________________________________________________  WHAT IS A BLOOD TRANSFUSION? Blood Transfusion Information  A transfusion is the replacement of blood or some of its parts. Blood is made up of multiple cells which provide different functions.  Red blood cells carry oxygen and are used for blood loss replacement.  White blood cells fight against infection.  Platelets control bleeding.  Plasma helps clot blood.  Other blood products are available for specialized needs, such as hemophilia or other clotting disorders. BEFORE THE TRANSFUSION  Who gives blood for transfusions?   Healthy volunteers who are fully evaluated to make sure their blood is safe. This is blood bank blood. Transfusion therapy is the safest it has ever been in the practice of medicine. Before blood is taken from a donor, a complete history is taken to make sure that person has no history of diseases nor engages in risky social behavior (examples are intravenous drug use or sexual activity with multiple partners). The donor's travel history is screened to minimize risk of transmitting infections, such as malaria. The donated blood is tested for signs of infectious diseases, such as HIV and hepatitis. The blood is then tested to be sure it is compatible with you in order to minimize the chance of a transfusion reaction. If you or a relative donates blood, this is often done in anticipation of surgery and is not appropriate for emergency situations. It takes many days to process the donated blood. RISKS AND COMPLICATIONS Although transfusion therapy is very safe and saves many lives, the main dangers of transfusion include:    Getting an infectious disease.  Developing a transfusion reaction. This is an allergic reaction to something in the blood you were given. Every precaution is taken to prevent this. The decision to have a blood transfusion has been considered carefully by your caregiver before blood is given. Blood is not given unless  the benefits outweigh the risks. AFTER THE TRANSFUSION  Right after receiving a blood transfusion, you will usually feel much better and more energetic. This is especially true if your red blood cells have gotten low (anemic). The transfusion raises the level of the red blood cells which carry oxygen, and this usually causes an energy increase.  The nurse administering the transfusion will monitor you carefully for complications. HOME CARE INSTRUCTIONS  No special instructions are needed after a transfusion. You may find your energy is better. Speak with your caregiver about any limitations on activity for underlying diseases you may have. SEEK MEDICAL CARE IF:   Your condition is not improving after your transfusion.  You develop redness or irritation at the intravenous (IV) site. SEEK IMMEDIATE MEDICAL CARE IF:  Any of the following symptoms occur over the next 12 hours:  Shaking chills.  You have a temperature by mouth above 102 F (38.9 C), not controlled by medicine.  Chest, back, or muscle pain.  People around you feel you are not acting correctly or are confused.  Shortness of breath or difficulty breathing.  Dizziness and fainting.  You get a rash or develop hives.  You have a decrease in urine output.  Your urine turns a dark color or changes to pink, red, or brown. Any of the following symptoms occur over the next 10 days:  You have a temperature by mouth above 102 F (38.9 C), not controlled by medicine.  Shortness of breath.  Weakness after normal activity.  The white part of the eye turns yellow (jaundice).  You have a decrease in the  amount of urine or are urinating less often.  Your urine turns a dark color or changes to pink, red, or brown. Document Released: 06/06/2000 Document Revised: 09/01/2011 Document Reviewed: 01/24/2008 [facility] Patient Information 2014 [place], [place].  _______________________________________________________________________

## 2018-01-04 ENCOUNTER — Encounter (HOSPITAL_COMMUNITY): Payer: Self-pay

## 2018-01-04 ENCOUNTER — Other Ambulatory Visit: Payer: Self-pay

## 2018-01-04 ENCOUNTER — Encounter (HOSPITAL_COMMUNITY)
Admission: RE | Admit: 2018-01-04 | Discharge: 2018-01-04 | Disposition: A | Discharge status: Home / Self Care | Payer: 59 | Source: Ambulatory Visit | Attending: Orthopedic Surgery | Admitting: Orthopedic Surgery

## 2018-01-04 DIAGNOSIS — Z01812 Encounter for preprocedural laboratory examination: Secondary | ICD-10-CM | POA: Diagnosis not present

## 2018-01-04 HISTORY — DX: Unspecified osteoarthritis, unspecified site: M19.90

## 2018-01-04 LAB — URINALYSIS, ROUTINE W REFLEX MICROSCOPIC
BILIRUBIN URINE: NEGATIVE
Glucose, UA: NEGATIVE mg/dL
HGB URINE DIPSTICK: NEGATIVE
KETONES UR: NEGATIVE mg/dL
Leukocytes, UA: NEGATIVE
NITRITE: NEGATIVE
PH: 5 (ref 5.0–8.0)
Protein, ur: NEGATIVE mg/dL
SPECIFIC GRAVITY, URINE: 1.009 (ref 1.005–1.030)

## 2018-01-04 LAB — CBC
HEMATOCRIT: 44.1 % (ref 36.0–46.0)
Hemoglobin: 14.5 g/dL (ref 12.0–15.0)
MCH: 30.5 pg (ref 26.0–34.0)
MCHC: 32.9 g/dL (ref 30.0–36.0)
MCV: 92.8 fL (ref 78.0–100.0)
PLATELETS: 272 10*3/uL (ref 150–400)
RBC: 4.75 MIL/uL (ref 3.87–5.11)
RDW: 12.6 % (ref 11.5–15.5)
WBC: 5.5 10*3/uL (ref 4.0–10.5)

## 2018-01-04 LAB — COMPREHENSIVE METABOLIC PANEL
ALBUMIN: 4.2 g/dL (ref 3.5–5.0)
ALT: 21 U/L (ref 0–44)
ANION GAP: 9 (ref 5–15)
AST: 20 U/L (ref 15–41)
Alkaline Phosphatase: 63 U/L (ref 38–126)
BUN: 20 mg/dL (ref 8–23)
CO2: 29 mmol/L (ref 22–32)
Calcium: 9.3 mg/dL (ref 8.9–10.3)
Chloride: 100 mmol/L (ref 98–111)
Creatinine, Ser: 0.73 mg/dL (ref 0.44–1.00)
GFR calc Af Amer: >60 mL/min (ref >60)
GFR calc non Af Amer: >60 mL/min (ref >60)
GLUCOSE: 87 mg/dL (ref 70–99)
POTASSIUM: 4.4 mmol/L (ref 3.5–5.1)
Sodium: 138 mmol/L (ref 135–145)
Total Bilirubin: 0.7 mg/dL (ref 0.3–1.2)
Total Protein: 7.4 g/dL (ref 6.5–8.1)

## 2018-01-04 LAB — APTT: APTT: 30 s (ref 24–36)

## 2018-01-04 LAB — PROTIME-INR
INR: 0.89
Prothrombin Time: 11.9 seconds (ref 11.4–15.2)

## 2018-01-04 LAB — SURGICAL PCR SCREEN
MRSA, PCR: NEGATIVE
Staphylococcus aureus: NEGATIVE

## 2018-01-05 ENCOUNTER — Other Ambulatory Visit (HOSPITAL_COMMUNITY): Payer: 59

## 2018-01-06 DIAGNOSIS — Z01818 Encounter for other preprocedural examination: Secondary | ICD-10-CM | POA: Diagnosis not present

## 2018-01-06 DIAGNOSIS — H524 Presbyopia: Secondary | ICD-10-CM | POA: Diagnosis not present

## 2018-01-07 NOTE — Progress Notes (Signed)
Clearance 01-06-18 Dr. Suzy Bouchard on chart  lov 01-06-18 [facility] FNP chart

## 2018-01-12 MED ORDER — BUPIVACAINE LIPOSOME 1.3 % IJ SUSP
20.0000 mL | Freq: Once | INTRAMUSCULAR | Status: DC
  Filled 2018-01-12: qty 20

## 2018-01-13 ENCOUNTER — Inpatient Hospital Stay (HOSPITAL_COMMUNITY): Payer: 59 | Admitting: Certified Registered Nurse Anesthetist

## 2018-01-13 ENCOUNTER — Other Ambulatory Visit: Payer: Self-pay

## 2018-01-13 ENCOUNTER — Encounter (HOSPITAL_COMMUNITY): Payer: Self-pay | Admitting: Anesthesiology

## 2018-01-13 ENCOUNTER — Inpatient Hospital Stay (HOSPITAL_COMMUNITY)
Admission: RE | Admit: 2018-01-13 | Discharge: 2018-01-15 | DRG: 468 | Disposition: A | Discharge status: Home Health | Payer: 59 | Source: Ambulatory Visit | Attending: Orthopedic Surgery | Admitting: Orthopedic Surgery

## 2018-01-13 ENCOUNTER — Encounter (HOSPITAL_COMMUNITY)
Admission: RE | Disposition: A | Discharge status: Home Health | Payer: Self-pay | Source: Ambulatory Visit | Attending: Orthopedic Surgery

## 2018-01-13 DIAGNOSIS — Z6825 Body mass index (BMI) 25.0-25.9, adult: Secondary | ICD-10-CM

## 2018-01-13 DIAGNOSIS — M25562 Pain in left knee: Secondary | ICD-10-CM | POA: Diagnosis present

## 2018-01-13 DIAGNOSIS — K219 Gastro-esophageal reflux disease without esophagitis: Secondary | ICD-10-CM | POA: Diagnosis present

## 2018-01-13 DIAGNOSIS — Z90710 Acquired absence of both cervix and uterus: Secondary | ICD-10-CM | POA: Diagnosis not present

## 2018-01-13 DIAGNOSIS — T8482XD Fibrosis due to internal orthopedic prosthetic devices, implants and grafts, subsequent encounter: Secondary | ICD-10-CM | POA: Diagnosis present

## 2018-01-13 DIAGNOSIS — G8918 Other acute postprocedural pain: Secondary | ICD-10-CM | POA: Diagnosis not present

## 2018-01-13 DIAGNOSIS — Z880 Allergy status to penicillin: Secondary | ICD-10-CM

## 2018-01-13 DIAGNOSIS — Z800 Family history of malignant neoplasm of digestive organs: Secondary | ICD-10-CM

## 2018-01-13 DIAGNOSIS — F329 Major depressive disorder, single episode, unspecified: Secondary | ICD-10-CM | POA: Diagnosis present

## 2018-01-13 DIAGNOSIS — Y793 Surgical instruments, materials and orthopedic devices (including sutures) associated with adverse incidents: Secondary | ICD-10-CM | POA: Diagnosis present

## 2018-01-13 DIAGNOSIS — E669 Obesity, unspecified: Secondary | ICD-10-CM | POA: Diagnosis present

## 2018-01-13 DIAGNOSIS — M1712 Unilateral primary osteoarthritis, left knee: Secondary | ICD-10-CM | POA: Diagnosis not present

## 2018-01-13 DIAGNOSIS — T84033A Mechanical loosening of internal left knee prosthetic joint, initial encounter: Principal | ICD-10-CM | POA: Diagnosis present

## 2018-01-13 DIAGNOSIS — Z7951 Long term (current) use of inhaled steroids: Secondary | ICD-10-CM | POA: Diagnosis not present

## 2018-01-13 DIAGNOSIS — Z7901 Long term (current) use of anticoagulants: Secondary | ICD-10-CM | POA: Diagnosis not present

## 2018-01-13 DIAGNOSIS — E7800 Pure hypercholesterolemia, unspecified: Secondary | ICD-10-CM | POA: Diagnosis present

## 2018-01-13 DIAGNOSIS — Z96652 Presence of left artificial knee joint: Secondary | ICD-10-CM

## 2018-01-13 DIAGNOSIS — M87862 Other osteonecrosis, left tibia: Secondary | ICD-10-CM | POA: Diagnosis not present

## 2018-01-13 DIAGNOSIS — T84093A Other mechanical complication of internal left knee prosthesis, initial encounter: Secondary | ICD-10-CM | POA: Diagnosis not present

## 2018-01-13 DIAGNOSIS — T84063A Wear of articular bearing surface of internal prosthetic left knee joint, initial encounter: Secondary | ICD-10-CM | POA: Diagnosis not present

## 2018-01-13 HISTORY — PX: CONVERSION TO TOTAL KNEE: SHX5785

## 2018-01-13 LAB — TYPE AND SCREEN
ABO/RH(D): O NEG
ANTIBODY SCREEN: NEGATIVE

## 2018-01-13 SURGERY — CONVERSION, ARTHROPLASTY, KNEE, PARTIAL, TO TOTAL KNEE ARTHROPLASTY
Anesthesia: Regional | Site: Knee | Laterality: Left

## 2018-01-13 MED ORDER — RIVAROXABAN 10 MG PO TABS
10.0000 mg | ORAL_TABLET | Freq: Every day | ORAL | Status: DC
  Administered 2018-01-14 – 2018-01-16 (×3): 10 mg via ORAL
  Filled 2018-01-13 (×3): qty 1

## 2018-01-13 MED ORDER — METOCLOPRAMIDE HCL 5 MG/ML IJ SOLN
5.0000 mg | Freq: Three times a day (TID) | INTRAMUSCULAR | Status: DC | PRN
  Filled 2018-01-13: qty 2

## 2018-01-13 MED ORDER — FENTANYL CITRATE (PF) 100 MCG/2ML IJ SOLN
25.0000 ug | INTRAMUSCULAR | Status: DC | PRN
  Administered 2018-01-13 (×3): 50 ug via INTRAVENOUS

## 2018-01-13 MED ORDER — FENTANYL CITRATE (PF) 100 MCG/2ML IJ SOLN
50.0000 ug | INTRAMUSCULAR | Status: AC | PRN
  Administered 2018-01-13: 50 ug via INTRAVENOUS
  Administered 2018-01-13: 25 ug via INTRAVENOUS
  Administered 2018-01-13: 75 ug via INTRAVENOUS
  Administered 2018-01-13: 25 ug via INTRAVENOUS
  Administered 2018-01-13 (×2): 50 ug via INTRAVENOUS
  Administered 2018-01-13: 25 ug via INTRAVENOUS
  Filled 2018-01-13: qty 2

## 2018-01-13 MED ORDER — LACTATED RINGERS IV SOLN: INTRAVENOUS | Status: DC

## 2018-01-13 MED ORDER — ONDANSETRON HCL 4 MG/2ML IJ SOLN
INTRAMUSCULAR | Status: DC | PRN
  Administered 2018-01-13 (×2): 4 mg via INTRAVENOUS

## 2018-01-13 MED ORDER — VANCOMYCIN HCL IN DEXTROSE 1-5 GM/200ML-% IV SOLN
1000.0000 mg | INTRAVENOUS | Status: AC
  Administered 2018-01-13: 1000 mg via INTRAVENOUS
  Filled 2018-01-13: qty 200

## 2018-01-13 MED ORDER — METOCLOPRAMIDE HCL 5 MG PO TABS
5.0000 mg | ORAL_TABLET | Freq: Three times a day (TID) | ORAL | Status: DC | PRN
  Administered 2018-01-14: 10 mg via ORAL
  Filled 2018-01-13: qty 2

## 2018-01-13 MED ORDER — STERILE WATER FOR IRRIGATION IR SOLN
Status: DC | PRN
  Administered 2018-01-13: 2000 mL

## 2018-01-13 MED ORDER — SODIUM CHLORIDE 0.9 % IV SOLN
INTRAVENOUS | Status: DC
  Administered 2018-01-13 – 2018-01-14 (×2): via INTRAVENOUS

## 2018-01-13 MED ORDER — GABAPENTIN 300 MG PO CAPS
300.0000 mg | ORAL_CAPSULE | Freq: Once | ORAL | Status: AC
  Administered 2018-01-13: 300 mg via ORAL
  Filled 2018-01-13: qty 1

## 2018-01-13 MED ORDER — TRANEXAMIC ACID 1000 MG/10ML IV SOLN
1000.0000 mg | Freq: Once | INTRAVENOUS | Status: AC
  Administered 2018-01-13: 1000 mg via INTRAVENOUS
  Filled 2018-01-13: qty 1100

## 2018-01-13 MED ORDER — DOCUSATE SODIUM 100 MG PO CAPS
100.0000 mg | ORAL_CAPSULE | Freq: Two times a day (BID) | ORAL | Status: DC
  Administered 2018-01-13 – 2018-01-16 (×6): 100 mg via ORAL
  Filled 2018-01-13 (×6): qty 1

## 2018-01-13 MED ORDER — OXYCODONE HCL 5 MG PO TABS
5.0000 mg | ORAL_TABLET | ORAL | Status: DC | PRN
  Administered 2018-01-13: 5 mg via ORAL
  Administered 2018-01-14 – 2018-01-15 (×4): 10 mg via ORAL
  Filled 2018-01-13 (×2): qty 2
  Filled 2018-01-13: qty 1
  Filled 2018-01-13 (×3): qty 2

## 2018-01-13 MED ORDER — GABAPENTIN 300 MG PO CAPS
300.0000 mg | ORAL_CAPSULE | Freq: Three times a day (TID) | ORAL | Status: DC
  Administered 2018-01-13 – 2018-01-16 (×8): 300 mg via ORAL
  Filled 2018-01-13 (×8): qty 1

## 2018-01-13 MED ORDER — ROPIVACAINE HCL 7.5 MG/ML IJ SOLN
INTRAMUSCULAR | Status: DC | PRN
  Administered 2018-01-13: 20 mL via PERINEURAL

## 2018-01-13 MED ORDER — FENTANYL CITRATE (PF) 100 MCG/2ML IJ SOLN
INTRAMUSCULAR | Status: AC
  Filled 2018-01-13: qty 2

## 2018-01-13 MED ORDER — VANCOMYCIN HCL IN DEXTROSE 1-5 GM/200ML-% IV SOLN
1000.0000 mg | Freq: Two times a day (BID) | INTRAVENOUS | Status: AC
  Administered 2018-01-14: 1000 mg via INTRAVENOUS
  Filled 2018-01-13: qty 200

## 2018-01-13 MED ORDER — DEXAMETHASONE SODIUM PHOSPHATE 10 MG/ML IJ SOLN
8.0000 mg | Freq: Once | INTRAMUSCULAR | Status: AC
  Administered 2018-01-13: 10 mg via INTRAVENOUS

## 2018-01-13 MED ORDER — OXYCODONE HCL 5 MG PO TABS
10.0000 mg | ORAL_TABLET | ORAL | Status: DC | PRN
  Administered 2018-01-14: 10 mg via ORAL
  Administered 2018-01-15: 15 mg via ORAL
  Filled 2018-01-13: qty 3

## 2018-01-13 MED ORDER — METHOCARBAMOL 500 MG PO TABS
500.0000 mg | ORAL_TABLET | Freq: Four times a day (QID) | ORAL | Status: DC | PRN
  Administered 2018-01-14 – 2018-01-15 (×5): 500 mg via ORAL
  Filled 2018-01-13 (×5): qty 1

## 2018-01-13 MED ORDER — 0.9 % SODIUM CHLORIDE (POUR BTL) OPTIME
TOPICAL | Status: DC | PRN
  Administered 2018-01-13: 1000 mL

## 2018-01-13 MED ORDER — MIDAZOLAM HCL 2 MG/2ML IJ SOLN
INTRAMUSCULAR | Status: AC
Start: 2018-01-13 — End: 2018-01-14
  Filled 2018-01-13: qty 2

## 2018-01-13 MED ORDER — DIPHENHYDRAMINE HCL 12.5 MG/5ML PO ELIX: 12.5000 mg | ORAL_SOLUTION | ORAL | Status: DC | PRN

## 2018-01-13 MED ORDER — CHLORHEXIDINE GLUCONATE 4 % EX LIQD: 60.0000 mL | Freq: Once | CUTANEOUS | Status: DC

## 2018-01-13 MED ORDER — SODIUM CHLORIDE 0.9 % IJ SOLN
INTRAMUSCULAR | Status: AC
  Filled 2018-01-13: qty 10

## 2018-01-13 MED ORDER — FLUTICASONE PROPIONATE 50 MCG/ACT NA SUSP
1.0000 | Freq: Every day | NASAL | Status: DC | PRN
  Filled 2018-01-13: qty 16

## 2018-01-13 MED ORDER — SODIUM CHLORIDE 0.9 % IJ SOLN
INTRAMUSCULAR | Status: DC | PRN
  Administered 2018-01-13: 60 mL via INTRAVENOUS

## 2018-01-13 MED ORDER — FENTANYL CITRATE (PF) 100 MCG/2ML IJ SOLN
INTRAMUSCULAR | Status: AC
  Filled 2018-01-13: qty 4

## 2018-01-13 MED ORDER — ONDANSETRON HCL 4 MG PO TABS: 4.0000 mg | ORAL_TABLET | Freq: Four times a day (QID) | ORAL | Status: DC | PRN

## 2018-01-13 MED ORDER — MIDAZOLAM HCL 2 MG/2ML IJ SOLN
1.0000 mg | INTRAMUSCULAR | Status: DC | PRN
  Administered 2018-01-13: 2 mg via INTRAVENOUS

## 2018-01-13 MED ORDER — EPHEDRINE SULFATE-NACL 50-0.9 MG/10ML-% IV SOSY
PREFILLED_SYRINGE | INTRAVENOUS | Status: DC | PRN
  Administered 2018-01-13 (×2): 10 mg via INTRAVENOUS

## 2018-01-13 MED ORDER — PANTOPRAZOLE SODIUM 40 MG PO TBEC
40.0000 mg | DELAYED_RELEASE_TABLET | Freq: Every day | ORAL | Status: DC
  Administered 2018-01-14 – 2018-01-16 (×3): 40 mg via ORAL
  Filled 2018-01-13 (×3): qty 1

## 2018-01-13 MED ORDER — SODIUM CHLORIDE 0.9 % IJ SOLN
INTRAMUSCULAR | Status: AC
  Filled 2018-01-13: qty 50

## 2018-01-13 MED ORDER — MENTHOL 3 MG MT LOZG: 1.0000 | LOZENGE | OROMUCOSAL | Status: DC | PRN

## 2018-01-13 MED ORDER — PROPOFOL 10 MG/ML IV BOLUS
INTRAVENOUS | Status: DC | PRN
  Administered 2018-01-13 (×2): 20 mg via INTRAVENOUS
  Administered 2018-01-13: 170 mg via INTRAVENOUS
  Administered 2018-01-13: 20 mg via INTRAVENOUS

## 2018-01-13 MED ORDER — ACETAMINOPHEN 500 MG PO TABS
1000.0000 mg | ORAL_TABLET | Freq: Four times a day (QID) | ORAL | Status: AC
  Administered 2018-01-13 – 2018-01-14 (×4): 1000 mg via ORAL
  Filled 2018-01-13 (×3): qty 2

## 2018-01-13 MED ORDER — BUPIVACAINE LIPOSOME 1.3 % IJ SUSP
INTRAMUSCULAR | Status: DC | PRN
  Administered 2018-01-13: 20 mL

## 2018-01-13 MED ORDER — ACETAMINOPHEN 10 MG/ML IV SOLN
1000.0000 mg | Freq: Four times a day (QID) | INTRAVENOUS | Status: DC
  Administered 2018-01-13: 1000 mg via INTRAVENOUS
  Filled 2018-01-13: qty 100

## 2018-01-13 MED ORDER — HYDROMORPHONE HCL 1 MG/ML IJ SOLN
0.5000 mg | INTRAMUSCULAR | Status: DC | PRN
  Administered 2018-01-13 – 2018-01-14 (×2): 1 mg via INTRAVENOUS
  Filled 2018-01-13 (×3): qty 1

## 2018-01-13 MED ORDER — POLYETHYLENE GLYCOL 3350 17 G PO PACK: 17.0000 g | PACK | Freq: Every day | ORAL | Status: DC | PRN

## 2018-01-13 MED ORDER — ONDANSETRON HCL 4 MG/2ML IJ SOLN
4.0000 mg | Freq: Four times a day (QID) | INTRAMUSCULAR | Status: DC | PRN
  Administered 2018-01-13 – 2018-01-14 (×2): 4 mg via INTRAVENOUS
  Filled 2018-01-13 (×2): qty 2

## 2018-01-13 MED ORDER — PROPOFOL 10 MG/ML IV BOLUS
INTRAVENOUS | Status: AC
  Filled 2018-01-13: qty 20

## 2018-01-13 MED ORDER — DEXAMETHASONE SODIUM PHOSPHATE 10 MG/ML IJ SOLN
10.0000 mg | Freq: Once | INTRAMUSCULAR | Status: AC
  Administered 2018-01-14: 10 mg via INTRAVENOUS
  Filled 2018-01-13: qty 1

## 2018-01-13 MED ORDER — PROPOFOL 500 MG/50ML IV EMUL: INTRAVENOUS | Status: DC | PRN

## 2018-01-13 MED ORDER — METHOCARBAMOL 1000 MG/10ML IJ SOLN
500.0000 mg | Freq: Four times a day (QID) | INTRAVENOUS | Status: DC | PRN
  Administered 2018-01-13: 500 mg via INTRAVENOUS
  Filled 2018-01-13: qty 550

## 2018-01-13 MED ORDER — EPHEDRINE 5 MG/ML INJ
INTRAVENOUS | Status: AC
  Filled 2018-01-13: qty 10

## 2018-01-13 MED ORDER — PROPOFOL 10 MG/ML IV BOLUS
INTRAVENOUS | Status: AC
Start: 2018-01-13 — End: ?
  Filled 2018-01-13: qty 40

## 2018-01-13 MED ORDER — LIDOCAINE 2% (20 MG/ML) 5 ML SYRINGE
INTRAMUSCULAR | Status: DC | PRN
  Administered 2018-01-13: 100 mg via INTRAVENOUS

## 2018-01-13 MED ORDER — FLEET ENEMA 7-19 GM/118ML RE ENEM: 1.0000 | ENEMA | Freq: Once | RECTAL | Status: DC | PRN

## 2018-01-13 MED ORDER — BISACODYL 10 MG RE SUPP: 10.0000 mg | Freq: Every day | RECTAL | Status: DC | PRN

## 2018-01-13 MED ORDER — SODIUM CHLORIDE 0.9 % IR SOLN
Status: DC | PRN
  Administered 2018-01-13: 1000 mL

## 2018-01-13 MED ORDER — SODIUM CHLORIDE 0.9 % IV SOLN
1000.0000 mg | INTRAVENOUS | Status: AC
  Administered 2018-01-13: 1000 mg via INTRAVENOUS
  Filled 2018-01-13: qty 10

## 2018-01-13 MED ORDER — PHENOL 1.4 % MT LIQD
1.0000 | OROMUCOSAL | Status: DC | PRN
  Filled 2018-01-13: qty 177

## 2018-01-13 MED ORDER — ATORVASTATIN CALCIUM 20 MG PO TABS
20.0000 mg | ORAL_TABLET | Freq: Every day | ORAL | Status: DC
  Administered 2018-01-14 – 2018-01-15 (×2): 20 mg via ORAL
  Filled 2018-01-13 (×3): qty 1

## 2018-01-13 MED ORDER — LACTATED RINGERS IV SOLN
INTRAVENOUS | Status: DC
  Administered 2018-01-13 (×2): via INTRAVENOUS

## 2018-01-13 MED ORDER — ONDANSETRON HCL 4 MG/2ML IJ SOLN: 4.0000 mg | Freq: Once | INTRAMUSCULAR | Status: DC | PRN

## 2018-01-13 SURGICAL SUPPLY — 71 items
AUG TIB SZ3 5 REV STP WDG STRL (Knees) ×2 IMPLANT
BAG SPEC THK2 15X12 ZIP CLS (MISCELLANEOUS) ×1
BAG ZIPLOCK 12X15 (MISCELLANEOUS) ×3 IMPLANT
BANDAGE ACE 6X5 VEL STRL LF (GAUZE/BANDAGES/DRESSINGS) ×3 IMPLANT
BANDAGE ESMARK 6X9 LF (GAUZE/BANDAGES/DRESSINGS) ×1 IMPLANT
BLADE SAG 18X100X1.27 (BLADE) ×3 IMPLANT
BLADE SAW SGTL 11.0X1.19X90.0M (BLADE) ×3 IMPLANT
BNDG CMPR 9X6 STRL LF SNTH (GAUZE/BANDAGES/DRESSINGS) ×1
BNDG CMPR MED 10X6 ELC LF (GAUZE/BANDAGES/DRESSINGS) ×1
BNDG ELASTIC 6X10 VLCR STRL LF (GAUZE/BANDAGES/DRESSINGS) ×2 IMPLANT
BNDG ESMARK 6X9 LF (GAUZE/BANDAGES/DRESSINGS) ×3
BOWL SMART MIX CTS (DISPOSABLE) ×3 IMPLANT
CEMENT HV SMART SET (Cement) ×6 IMPLANT
CLOSURE WOUND 1/2 X4 (GAUZE/BANDAGES/DRESSINGS) ×1
COVER SURGICAL LIGHT HANDLE (MISCELLANEOUS) ×3 IMPLANT
CUFF TOURN SGL QUICK 34 (TOURNIQUET CUFF) ×3
CUFF TRNQT CYL 34X4X40X1 (TOURNIQUET CUFF) ×1 IMPLANT
DRAPE EXTREMITY T 121X128X90 (DRAPE) ×3 IMPLANT
DRAPE POUCH INSTRU U-SHP 10X18 (DRAPES) ×3 IMPLANT
DRAPE U-SHAPE 47X51 STRL (DRAPES) ×3 IMPLANT
DRSG ADAPTIC 3X8 NADH LF (GAUZE/BANDAGES/DRESSINGS) ×3 IMPLANT
DRSG EMULSION OIL 3X16 NADH (GAUZE/BANDAGES/DRESSINGS) ×3 IMPLANT
DRSG PAD ABDOMINAL 8X10 ST (GAUZE/BANDAGES/DRESSINGS) ×3 IMPLANT
DURAPREP 26ML APPLICATOR (WOUND CARE) ×3 IMPLANT
ELECT REM PT RETURN 15FT ADLT (MISCELLANEOUS) ×3 IMPLANT
EVACUATOR 1/8 PVC DRAIN (DRAIN) ×3 IMPLANT
FACESHIELD WRAPAROUND (MASK) ×15 IMPLANT
GAUZE SPONGE 4X4 12PLY STRL (GAUZE/BANDAGES/DRESSINGS) ×3 IMPLANT
GLOVE BIO SURGEON STRL SZ7.5 (GLOVE) ×3 IMPLANT
GLOVE BIO SURGEON STRL SZ8 (GLOVE) ×3 IMPLANT
GLOVE BIOGEL PI IND STRL 7.0 (GLOVE) ×1 IMPLANT
GLOVE BIOGEL PI IND STRL 8 (GLOVE) ×3 IMPLANT
GLOVE BIOGEL PI INDICATOR 7.0 (GLOVE) ×2
GLOVE BIOGEL PI INDICATOR 8 (GLOVE) ×6
GLOVE SURG SS PI 6.5 STRL IVOR (GLOVE) ×4 IMPLANT
GLOVE SURG SS PI 7.0 STRL IVOR (GLOVE) ×3 IMPLANT
GOWN STRL REUS W/TWL LRG LVL3 (GOWN DISPOSABLE) ×6 IMPLANT
GOWN STRL REUS W/TWL XL LVL3 (GOWN DISPOSABLE) ×3 IMPLANT
HANDPIECE INTERPULSE COAX TIP (DISPOSABLE) ×3
IMMOBILIZER KNEE 20 (SOFTGOODS) ×6 IMPLANT
IMMOBILIZER KNEE 20 THIGH 36 (SOFTGOODS) ×1 IMPLANT
IMPL FEMUR SIGMA LT PS SZ 3 (Knees) IMPLANT
IMPLANT FEMUR SIGMA LT PS SZ 3 (Knees) ×3 IMPLANT
INSERT TIBIAL PFC 3 17.5 (Knees) ×3 IMPLANT
MANIFOLD NEPTUNE II (INSTRUMENTS) ×3 IMPLANT
NS IRRIG 1000ML POUR BTL (IV SOLUTION) ×3 IMPLANT
PAD ABD 7.5X8 STRL (GAUZE/BANDAGES/DRESSINGS) ×2 IMPLANT
PADDING CAST COTTON 6X4 STRL (CAST SUPPLIES) ×3 IMPLANT
PATELLA DOME PFC 35MM (Knees) ×2 IMPLANT
POSITIONER SURGICAL ARM (MISCELLANEOUS) ×3 IMPLANT
SET HNDPC FAN SPRY TIP SCT (DISPOSABLE) ×1 IMPLANT
STRIP CLOSURE SKIN 1/2X4 (GAUZE/BANDAGES/DRESSINGS) ×3 IMPLANT
SUCTION FRAZIER HANDLE 12FR (TUBING) ×2
SUCTION TUBE FRAZIER 12FR DISP (TUBING) ×1 IMPLANT
SUT MNCRL AB 4-0 PS2 18 (SUTURE) ×3 IMPLANT
SUT MON AB 3-0 SH 27 (SUTURE) ×3
SUT MON AB 3-0 SH27 (SUTURE) IMPLANT
SUT PDS AB 1 CT1 27 (SUTURE) ×9 IMPLANT
SUT STRATAFIX 0 PDS 27 VIOLET (SUTURE) ×3
SUT VIC AB 2-0 CT1 27 (SUTURE) ×9
SUT VIC AB 2-0 CT1 TAPERPNT 27 (SUTURE) ×3 IMPLANT
SUT VLOC 180 0 24IN GS25 (SUTURE) ×3 IMPLANT
SUTURE STRATFX 0 PDS 27 VIOLET (SUTURE) ×1 IMPLANT
TOWEL OR 17X26 10 PK STRL BLUE (TOWEL DISPOSABLE) ×6 IMPLANT
TRAY FOLEY MTR SLVR 16FR STAT (SET/KITS/TRAYS/PACK) ×3 IMPLANT
TRAY REVISION SZ 3 (Knees) ×3 IMPLANT
TRAY SLEEVE CEM ML (Knees) ×2 IMPLANT
WATER STERILE IRR 1000ML POUR (IV SOLUTION) ×3 IMPLANT
WEDGE SIZE 3 5MM (Knees) ×4 IMPLANT
WRAP KNEE MAXI GEL POST OP (GAUZE/BANDAGES/DRESSINGS) ×3 IMPLANT
YANKAUER SUCT BULB TIP 10FT TU (MISCELLANEOUS) IMPLANT

## 2018-01-13 NOTE — Discharge Instructions (Addendum)
° °Dr. Frank Aluisio °Total Joint Specialist °[facility] °[address] °[place], [place] [address] °[phone] ° °TOTAL KNEE REPLACEMENT POSTOPERATIVE DIRECTIONS ° °Knee Rehabilitation, Guidelines Following Surgery  °Results after knee surgery are often greatly improved when you follow the exercise, range of motion and muscle strengthening exercises prescribed by your doctor. Safety measures are also important to protect the knee from further injury. Any time any of these exercises cause you to have increased pain or swelling in your knee joint, decrease the amount until you are comfortable again and slowly increase them. If you have problems or questions, call your caregiver or physical therapist for advice.  ° °HOME CARE INSTRUCTIONS  °Remove items at home which could result in a fall. This includes throw rugs or furniture in walking pathways.  °· ICE to the affected knee every three hours for 30 minutes at a time and then as needed for pain and swelling.  Continue to use ice on the knee for pain and swelling from surgery. You may notice swelling that will progress down to the foot and ankle.  This is normal after surgery.  Elevate the leg when you are not up walking on it.   °· Continue to use the breathing machine which will help keep your temperature down.  It is common for your temperature to cycle up and down following surgery, especially at night when you are not up moving around and exerting yourself.  The breathing machine keeps your lungs expanded and your temperature down. °· Do not place pillow under knee, focus on keeping the knee straight while resting ° °DIET °You may resume your previous home diet once your are discharged from the hospital. ° °DRESSING / WOUND CARE / SHOWERING °You may shower 3 days after surgery, but keep the wounds dry during showering.  You may use an occlusive plastic wrap (Press'n Seal for example), NO SOAKING/SUBMERGING IN THE BATHTUB.  If the bandage gets  wet, change with a clean dry gauze.  If the incision gets wet, pat the wound dry with a clean towel. °You may start showering once you are discharged home but do not submerge the incision under water. Just pat the incision dry and apply a dry gauze dressing on daily. °Change the surgical dressing daily and reapply a dry dressing each time. ° °ACTIVITY °Walk with your walker as instructed. °Use walker as long as suggested by your caregivers. °Avoid periods of inactivity such as sitting longer than an hour when not asleep. This helps prevent blood clots.  °You may resume a sexual relationship in one month or when given the OK by your doctor.  °You may return to work once you are cleared by your doctor.  °Do not drive a car for 6 weeks or until released by you surgeon.  °Do not drive while taking narcotics. ° °WEIGHT BEARING °Weight bearing as tolerated with assist device (walker, cane, etc) as directed, use it as long as suggested by your surgeon or therapist, typically at least 4-6 weeks. ° °POSTOPERATIVE CONSTIPATION PROTOCOL °Constipation - defined medically as fewer than three stools per week and severe constipation as less than one stool per week. ° °One of the most common issues patients have following surgery is constipation.  Even if you have a regular bowel pattern at home, your normal regimen is likely to be disrupted due to multiple reasons following surgery.  Combination of anesthesia, postoperative narcotics, change in appetite and fluid intake all can affect your bowels.    In order to avoid complications following surgery, here are some recommendations in order to help you during your recovery period. ° °Colace (docusate) - Pick up an over-the-counter form of Colace or another stool softener and take twice a day as long as you are requiring postoperative pain medications.  Take with a full glass of water daily.  If you experience loose stools or diarrhea, hold the colace until you stool forms back up.  If  your symptoms do not get better within 1 week or if they get worse, check with your doctor. ° °Dulcolax (bisacodyl) - Pick up over-the-counter and take as directed by the product packaging as needed to assist with the movement of your bowels.  Take with a full glass of water.  Use this product as needed if not relieved by Colace only.  ° °MiraLax (polyethylene glycol) - Pick up over-the-counter to have on hand.  MiraLax is a solution that will increase the amount of water in your bowels to assist with bowel movements.  Take as directed and can mix with a glass of water, juice, soda, coffee, or tea.  Take if you go more than two days without a movement. °Do not use MiraLax more than once per day. Call your doctor if you are still constipated or irregular after using this medication for 7 days in a row. ° °If you continue to have problems with postoperative constipation, please contact the office for further assistance and recommendations.  If you experience "the worst abdominal pain ever" or develop nausea or vomiting, please contact the office immediatly for further recommendations for treatment. ° °ITCHING ° If you experience itching with your medications, try taking only a single pain pill, or even half a pain pill at a time.  You can also use Benadryl over the counter for itching or also to help with sleep.  ° °TED HOSE STOCKINGS °Wear the elastic stockings on both legs for three weeks following surgery during the day but you may remove then at night for sleeping. ° °MEDICATIONS °See your medication summary on the “After Visit Summary” that the nursing staff will review with you prior to discharge.  You may have some home medications which will be placed on hold until you complete the course of blood thinner medication.  It is important for you to complete the blood thinner medication as prescribed by your surgeon.  Continue your approved medications as instructed at time of discharge. ° °PRECAUTIONS °If you  experience chest pain or shortness of breath - call 911 immediately for transfer to the hospital emergency department.  °If you develop a fever greater that 101 F, purulent drainage from wound, increased redness or drainage from wound, foul odor from the wound/dressing, or calf pain - CONTACT YOUR SURGEON.   °                                                °FOLLOW-UP APPOINTMENTS °Make sure you keep all of your appointments after your operation with your surgeon and caregivers. You should call the office at the above phone number and make an appointment for approximately two weeks after the date of your surgery or on the date instructed by your surgeon outlined in the "After Visit Summary". ° ° °RANGE OF MOTION AND STRENGTHENING EXERCISES  °Rehabilitation of the knee is important following a knee injury or   an operation. After just a few days of immobilization, the muscles of the thigh which control the knee become weakened and shrink (atrophy). Knee exercises are designed to build up the tone and strength of the thigh muscles and to improve knee motion. Often times heat used for twenty to thirty minutes before working out will loosen up your tissues and help with improving the range of motion but do not use heat for the first two weeks following surgery. These exercises can be done on a training (exercise) mat, on the floor, on a table or on a bed. Use what ever works the best and is most comfortable for you Knee exercises include:  °Leg Lifts - While your knee is still immobilized in a splint or cast, you can do straight leg raises. Lift the leg to 60 degrees, hold for 3 sec, and slowly lower the leg. Repeat 10-20 times 2-3 times daily. Perform this exercise against resistance later as your knee gets better.  °Quad and Hamstring Sets - Tighten up the muscle on the front of the thigh (Quad) and hold for 5-10 sec. Repeat this 10-20 times hourly. Hamstring sets are done by pushing the foot backward against an object  and holding for 5-10 sec. Repeat as with quad sets.  °· Leg Slides: Lying on your back, slowly slide your foot toward your buttocks, bending your knee up off the floor (only go as far as is comfortable). Then slowly slide your foot back down until your leg is flat on the floor again. °· Angel Wings: Lying on your back spread your legs to the side as far apart as you can without causing discomfort.  °A rehabilitation program following serious knee injuries can speed recovery and prevent re-injury in the future due to weakened muscles. Contact your doctor or a physical therapist for more information on knee rehabilitation.  ° °IF YOU ARE TRANSFERRED TO A SKILLED REHAB FACILITY °If the patient is transferred to a skilled rehab facility following release from the hospital, a list of the current medications will be sent to the facility for the patient to continue.  When discharged from the skilled rehab facility, please have the facility set up the patient's Home Health Physical Therapy prior to being released. Also, the skilled facility will be responsible for providing the patient with their medications at time of release from the facility to include their pain medication, the muscle relaxants, and their blood thinner medication. If the patient is still at the rehab facility at time of the two week follow up appointment, the skilled rehab facility will also need to assist the patient in arranging follow up appointment in our office and any transportation needs. ° °MAKE SURE YOU:  °Understand these instructions.  °Get help right away if you are not doing well or get worse.  ° ° °Pick up stool softner and laxative for home use following surgery while on pain medications. °Do not submerge incision under water. °Please use good hand washing techniques while changing dressing each day. °May shower starting three days after surgery. °Please use a clean towel to pat the incision dry following showers. °Continue to use ice for  pain and swelling after surgery. °Do not use any lotions or creams on the incision until instructed by your surgeon. ° °Information on my medicine - XARELTO® (Rivaroxaban) ° ° ° °Why was Xarelto® prescribed for you? °Xarelto® was prescribed for you to reduce the risk of blood clots forming after orthopedic surgery. The medical term for   these abnormal blood clots is venous thromboembolism (VTE). ° °What do you need to know about xarelto® ? °Take your Xarelto® ONCE DAILY at the same time every day. °You may take it either with or without food. ° °If you have difficulty swallowing the tablet whole, you may crush it and mix in applesauce just prior to taking your dose. ° °Take Xarelto® exactly as prescribed by your doctor and DO NOT stop taking Xarelto® without talking to the doctor who prescribed the medication.  Stopping without other VTE prevention medication to take the place of Xarelto® may increase your risk of developing a clot. ° °After discharge, you should have regular check-up appointments with your healthcare provider that is prescribing your Xarelto®.   ° °What do you do if you miss a dose? °If you miss a dose, take it as soon as you remember on the same day then continue your regularly scheduled once daily regimen the next day. Do not take two doses of Xarelto® on the same day.  ° °Important Safety Information °A possible side effect of Xarelto® is bleeding. You should call your healthcare provider right away if you experience any of the following: °? Bleeding from an injury or your nose that does not stop. °? Unusual colored urine (red or dark brown) or unusual colored stools (red or black). °? Unusual bruising for unknown reasons. °? A serious fall or if you hit your head (even if there is no bleeding). ° °Some medicines may interact with Xarelto® and might increase your risk of bleeding while on Xarelto®. To help avoid this, consult your healthcare provider or pharmacist prior to using any new  prescription or non-prescription medications, including herbals, vitamins, non-steroidal anti-inflammatory drugs (NSAIDs) and supplements. ° °This website has more information on Xarelto®: www.xarelto.com. ° ° °

## 2018-01-13 NOTE — Anesthesia Procedure Notes (Signed)
Anesthesia Regional Block: Adductor canal block   Pre-Anesthetic Checklist: ,, timeout performed, Correct Patient, Correct Site, Correct Laterality, Correct Procedure,, site marked, risks and benefits discussed, Surgical consent,  Pre-op evaluation,  At surgeon's request and post-op pain management  Laterality: Left  Prep: chloraprep       Needles:  Injection technique: Single-shot  Needle Type: Echogenic Stimulator Needle     Needle Length: 10cm  Needle Gauge: 21     Additional Needles:   Procedures:,,,, ultrasound used (permanent image in chart),,,,  Narrative:  Start time: 01/13/2018 1:40 PM End time: 01/13/2018 1:50 PM Injection made incrementally with aspirations every 5 mL.  Performed by: Personally  Anesthesiologist: Murvin Natal, MD  Additional Notes: Functioning IV was confirmed and monitors were applied.  A 123mm 21ga Pajunk echogenic stimulator needle was used. Sterile prep, hand hygiene and sterile gloves were used.  Negative aspiration and negative test dose prior to incremental administration of local anesthetic. The patient tolerated the procedure well.

## 2018-01-13 NOTE — Brief Op Note (Signed)
01/13/2018  4:09 PM  PATIENT:  Joanna Willis  62 y.o. female  PRE-OPERATIVE DIAGNOSIS:  Failed left knee unicompartmental arthroplasty  POST-OPERATIVE DIAGNOSIS:  Failed left knee unicompartmental arthroplasty  PROCEDURE:  Procedure(s): Revision of Left knee unicompartmental arthroplasty to total knee arthroplasty (Left)  SURGEON:  Surgeon(s) and Role:    [name], MD - Primary  PHYSICIAN ASSISTANT:   ASSISTANTS: Kristi Edmisten, PA-C   ANESTHESIA:   regional and general  EBL:  50 mL   BLOOD ADMINISTERED:none  DRAINS: (Medium) Hemovact drain(s) in the left knee with  Suction Open   LOCAL MEDICATIONS USED:  OTHER Exparel  COUNTS:  YES  TOURNIQUET:   Total Tourniquet Time Documented: Thigh (Left) - 55 minutes Total: Thigh (Left) - 55 minutes   DICTATION: .Other Dictation: Dictation Number [id number]  PLAN OF CARE: Admit to inpatient   PATIENT DISPOSITION:  PACU - hemodynamically stable.

## 2018-01-13 NOTE — Anesthesia Postprocedure Evaluation (Signed)
Anesthesia Post Note  Patient: VERMA GROTHAUS  Procedure(s) Performed: Revision of Left knee unicompartmental arthroplasty to total knee arthroplasty (Left Knee)     Patient location during evaluation: PACU Anesthesia Type: Regional and General Level of consciousness: awake and alert Pain management: pain level controlled Vital Signs Assessment: post-procedure vital signs reviewed and stable Respiratory status: spontaneous breathing, nonlabored ventilation, respiratory function stable and patient connected to nasal cannula oxygen Cardiovascular status: blood pressure returned to baseline and stable Postop Assessment: no apparent nausea or vomiting Anesthetic complications: no    Last Vitals:  Vitals:   01/13/18 1715 01/13/18 1740  BP: 120/73 129/76  Pulse: 78 76  Resp: 10 16  Temp: 36.6 C 36.5 C  SpO2: 100% 100%    Last Pain:  Vitals:   01/13/18 1740  TempSrc: Oral  PainSc:                  Ryan P Ellender

## 2018-01-13 NOTE — Plan of Care (Signed)
  Problem: Health Behavior/Discharge Planning: Goal: Ability to manage health-related needs will improve Outcome: Progressing   Problem: Clinical Measurements: Goal: Ability to maintain clinical measurements within normal limits will improve Outcome: Progressing   Problem: Clinical Measurements: Goal: Will remain free from infection Outcome: Progressing   Problem: Clinical Measurements: Goal: Respiratory complications will improve Outcome: Progressing   Problem: Safety: Goal: Ability to remain free from injury will improve Outcome: Progressing   Problem: Skin Integrity: Goal: Risk for impaired skin integrity will decrease Outcome: Progressing

## 2018-01-13 NOTE — Op Note (Signed)
NAME: Joanna Willis, POTTINGER MEDICAL RECORD [id number] ACCOUNT [id number] DATE OF BIRTH:26-Aug-1955 FACILITY: [facility] LOCATION: [facility] PHYSICIAN:Pericles Carmicheal Zella Ball, MD  OPERATIVE REPORT  DATE OF PROCEDURE:  01/13/2018  PREOPERATIVE DIAGNOSIS:  Failed unicompartmental arthroplasty, left knee.  POSTOPERATIVE DIAGNOSIS:  Failed unicompartmental arthroplasty, left knee.  PROCEDURE:  Conversion of left knee unicompartmental replacement to total knee arthroplasty.  SURGEON:  [name], MD  ASSISTANT:  Theresa Duty, PA-C   ANESTHESIA:  Adductor canal block and general.  ESTIMATED BLOOD LOSS:  Minimal.  DRAINS:  Hemovac x1.  COMPLICATIONS:  None.  TOURNIQUET TIME:  54 minutes at 300 mmHg.  CONDITION:  Stable to recovery.  BRIEF CLINICAL NOTE:  The patient is a 62 year old female with a left knee medial unicompartmental arthroplasty done approximately a year ago.  She has had persistent pain now starting about 6 weeks postoperative.  She has had a workup including a bone  scan and plain x-rays and did not show any definitive loosening.  We have treated this nonoperatively throughout this entire time.  It is felt now that either she has loosening of the tibial component or perhaps some osteonecrosis around it.  She  presents now for revision to a total knee arthroplasty given the persistent pain and dysfunction.  PROCEDURE IN DETAIL:  After successful administration of adductor canal block, then general anesthetic, a tourniquet was placed on her left lower extremity.  It was prepped and draped in the usual sterile fashion.  The extremity was wrapped in an Esmarch  and tourniquet inflated to 300 mmHg.  A midline incision was made with a 10 blade through subcutaneous tissue to the level of the extensor mechanism.  A fresh blade was used to make a medial parapatellar arthrotomy.  No abnormal fluid was encountered.   There was a fair amount of synovitis present.  The soft tissue over the  proximal medial tibia was then subperiosteally elevated at the joint line with a knife and into the semimembranosus bursa with a Cobb elevator.  Soft tissue laterally was elevated  with attention being paid to avoid the patellar tendon on the tibial tubercle.  The patella was everted, knee flexed 90 degrees, and ACL, PCL removed.  The unicompartmental replacement appears to be in very good position and appears to be well fixed.  I  had to remove the femoral component first to do a femoral preparation.  We had to use an osteotome to disrupt the interface between the femoral component and bone, and the femoral component was very well fixed.  It was removed with minimal to no bone  loss.  Drill was used to create a starting hole in the distal femur, and the canal was thoroughly irrigated.  A 5-degree right valgus alignment guide was placed, and a distal femoral cutting block obtained to remove 10 mm off the distal femur.  Distal  femoral resection was made with an oscillating saw.  The tibia was then subluxed forward and circumferential retraction achieved.  The tibial component was also well fixed.  I did use an osteotome to disrupt the interface between the component and bone and did remove it.  We then placed the extramedullary  tibial cutting guide referencing proximally at the medial aspect of the tibial tubercle and distally along the second metatarsal axis and tibial crest.  I pinned the block to remove about 2 mm below the previous resection level of the tibia for the  tibial component.  Cuts were made with an oscillating saw.  There was  some necrotic-appearing bone which was underneath the medial component.  There was no evidence of any fracture.  I removed the necrotic bone, and in doing so had to cut down about 2  more millimeters to get back to normal bone.  We then prepared the proximal tibia.  The size is 3, which is most appropriate.  The proximal tibia was prepared with a modular drill and keel  punch for the size 3.  Given the previous surgery, we opted to  place an MBT revision tray so I could build it up to get the joint line back to normal.  The preparation was completed by placing a 29 mm broach for the 29 sleeve.  Femoral sizer was placed.  Size 3 was most appropriate.  Rotation was marked off the epicondylar axis.  The size 3 cutting block was then placed, matching the rotation of the epicondylar axis.  The anterior, posterior and chamfer cuts were made.   Intercondylar block was placed.  The neck cut was made.  Trial components were placed on the femoral side with a size 3 posterior stabilized femur on the tibial side.  It is a size 3 MBT tibia with 5 mm medial and lateral augments and a 29 mm sleeve.   The trial was placed, and we got up to 17.5 thickness insert, which had full extension with excellent varus/valgus and anterior/posterior balance throughout full range of motion.  The patella was everted and thickness measured to be 22 mm.  Freehand  resection was taken to 12 mm, a 35 template was placed, lug holes were drilled, trial patella was placed, and it tracks normally.  Osteophytes were removed from the posterior femur with trial in place.  All trials were removed, and the cut bone surfaces  prepared with pulsatile lavage.  Cement was mixed, and once ready for implantation, the size 3 MBT revision tray with a 29 mm sleeve and 5 mm medial augment was cemented into position, impacted and all extruded cement removed.  A size 3 posterior  stabilized femur was placed, and that was impacted and all extruded cement removed.  A 17.5 mm trial insert was placed.  Knee held in full extension.  We also cemented in the 35 patella which was held with a clamp.  When the cement was fully hardened,  the knee permanent, a 17.5 mm rotating platform insert was placed for the size 3.  The knee had excellent stability throughout full range of motion and the patella tracked normally.  The wound was  copiously irrigated with saline solution, and then the  extensor mechanism closed over a Hemovac drain with a running 0 Stratafix suture.  Tourniquet was then released for a total time of 54 minutes.  Flexion against gravity was 130 degrees and the patella tracked normally.  Subcu was then closed with  interrupted 2-0 Vicryl and subcuticular running 4-0 Monocryl.  The incision was cleaned and dried, and Steri-Strips and a bulky sterile dressing were applied.  She was then placed into a knee immobilizer, awakened and transported to recovery in stable  condition.  Note that a surgical assistant was a medical necessity for this procedure to do it in a safe and expeditious manner.  The surgical assistant was necessary for retraction of vital ligaments and neurovascular structures and for proper positioning of the  limb for safe and accurate removal of the old prosthesis and for safe and accurate placement of the new prosthesis.  LN/NUANCE  D:01/13/2018 T:01/13/2018 JOB:[id number]

## 2018-01-13 NOTE — Interval H&P Note (Signed)
History and Physical Interval Note:  01/13/2018 1:24 PM  Joanna Willis  has presented today for surgery, with the diagnosis of Failed left knee unicompartmental arthroplasty  The various methods of treatment have been discussed with the patient and family. After consideration of risks, benefits and other options for treatment, the patient has consented to  Procedure(s): Revision of Left knee unicompartmental arthroplasty to total knee arthroplasty (Left) as a surgical intervention .  The patient's history has been reviewed, patient examined, no change in status, stable for surgery.  I have reviewed the patient's chart and labs.  Questions were answered to the patient's satisfaction.     Pilar Plate Victoria Henshaw

## 2018-01-13 NOTE — Anesthesia Preprocedure Evaluation (Addendum)
Anesthesia Evaluation  Patient identified by MRN, date of birth, ID band Patient awake    Reviewed: Allergy & Precautions, NPO status , Patient's Chart, lab work & pertinent test results  History of Anesthesia Complications (+) PONV and history of anesthetic complications  Airway Mallampati: I  TM Distance: >3 FB Neck ROM: Full    Dental no notable dental hx.    Pulmonary neg pulmonary ROS,    Pulmonary exam normal breath sounds clear to auscultation       Cardiovascular negative cardio ROS Normal cardiovascular exam Rhythm:Regular Rate:Normal     Neuro/Psych PSYCHIATRIC DISORDERS Depression negative neurological ROS     GI/Hepatic Neg liver ROS, GERD  Medicated and Controlled,  Endo/Other  negative endocrine ROS  Renal/GU negative Renal ROS     Musculoskeletal  (+) Arthritis ,   Abdominal   Peds  Hematology HLD   Anesthesia Other Findings Failed left knee unicompartmental arthroplasty  Reproductive/Obstetrics                           Anesthesia Physical Anesthesia Plan  ASA: II  Anesthesia Plan: Spinal and Regional   Post-op Pain Management:  Regional for Post-op pain   Induction: Intravenous  PONV Risk Score and Plan: 3 and Ondansetron, Dexamethasone, Propofol infusion, Treatment may vary due to age or medical condition and Midazolam  Airway Management Planned: Nasal Cannula  Additional Equipment:   Intra-op Plan:   Post-operative Plan:   Informed Consent: I have reviewed the patients History and Physical, chart, labs and discussed the procedure including the risks, benefits and alternatives for the proposed anesthesia with the patient or authorized representative who has indicated his/her understanding and acceptance.   Dental advisory given  Plan Discussed with: CRNA  Anesthesia Plan Comments:         Anesthesia Quick Evaluation

## 2018-01-13 NOTE — Anesthesia Procedure Notes (Signed)
Procedure Name: LMA Insertion Date/Time: 01/13/2018 2:48 PM Performed by: Lavina Hamman, CRNA Pre-anesthesia Checklist: Patient identified, Emergency Drugs available, Suction available, Patient being monitored and Timeout performed Patient Re-evaluated:Patient Re-evaluated prior to induction Oxygen Delivery Method: Circle system utilized Preoxygenation: Pre-oxygenation with 100% oxygen Induction Type: IV induction Ventilation: Mask ventilation without difficulty LMA: LMA inserted Tube size: 4.0 mm Number of attempts: 1 Placement Confirmation: positive ETCO2 and breath sounds checked- equal and bilateral Tube secured with: Tape Dental Injury: Teeth and Oropharynx as per pre-operative assessment

## 2018-01-13 NOTE — Progress Notes (Signed)
Assisted Dr. Ellender with left, ultrasound guided, adductor canal block. Side rails up, monitors on throughout procedure. See vital signs in flow sheet. Tolerated Procedure well.  

## 2018-01-13 NOTE — Transfer of Care (Signed)
Immediate Anesthesia Transfer of Care Note  Patient: Joanna Willis  Procedure(s) Performed: Revision of Left knee unicompartmental arthroplasty to total knee arthroplasty (Left Knee)  Patient Location: PACU  Anesthesia Type:General  Level of Consciousness: awake, alert , oriented and patient cooperative  Airway & Oxygen Therapy: Patient Spontanous Breathing and Patient connected to face mask  Post-op Assessment: Report given to RN and Post -op Vital signs reviewed and stable  Post vital signs: Reviewed and stable  Last Vitals:  Vitals Value Taken Time  BP 121/102 01/13/2018  4:45 PM  Temp    Pulse 68 01/13/2018  4:48 PM  Resp 14 01/13/2018  4:48 PM  SpO2 100 % 01/13/2018  4:48 PM  Vitals shown include unvalidated device data.  Last Pain:  Vitals:   01/13/18 1346  TempSrc:   PainSc: 0-No pain      Patients Stated Pain Goal: 4 (25/95/63 8756)  Complications: No apparent anesthesia complications

## 2018-01-14 ENCOUNTER — Encounter (HOSPITAL_COMMUNITY): Payer: Self-pay | Admitting: Orthopedic Surgery

## 2018-01-14 LAB — BASIC METABOLIC PANEL
Anion gap: 8 (ref 5–15)
BUN: 9 mg/dL (ref 8–23)
CALCIUM: 8.8 mg/dL — AB (ref 8.9–10.3)
CO2: 30 mmol/L (ref 22–32)
CREATININE: 0.69 mg/dL (ref 0.44–1.00)
Chloride: 101 mmol/L (ref 98–111)
GFR calc Af Amer: >60 mL/min (ref >60)
GFR calc non Af Amer: >60 mL/min (ref >60)
GLUCOSE: 161 mg/dL — AB (ref 70–99)
Potassium: 4.4 mmol/L (ref 3.5–5.1)
Sodium: 139 mmol/L (ref 135–145)

## 2018-01-14 LAB — CBC
HEMATOCRIT: 40 % (ref 36.0–46.0)
Hemoglobin: 13 g/dL (ref 12.0–15.0)
MCH: 30.4 pg (ref 26.0–34.0)
MCHC: 32.5 g/dL (ref 30.0–36.0)
MCV: 93.5 fL (ref 78.0–100.0)
Platelets: 276 10*3/uL (ref 150–400)
RBC: 4.28 MIL/uL (ref 3.87–5.11)
RDW: 12.7 % (ref 11.5–15.5)
WBC: 10.3 10*3/uL (ref 4.0–10.5)

## 2018-01-14 NOTE — Evaluation (Signed)
Physical Therapy Evaluation Patient Details Name: Joanna Willis MRN: [id number] DOB: 05/25/1956 Today's Date: 01/14/2018   History of Present Illness  Pt s/p L TKR - converted from UKR   Clinical Impression  Pt s/p L TKR and presents with decreased L LE strength/ROM and post op pain limiting functional mobility.  Pt should progress to dc home with family assist and has OP PT scheduled for Monday 01/18/18.    Follow Up Recommendations Follow surgeon's recommendation for DC plan and follow-up therapies    Equipment Recommendations  None recommended by PT    Recommendations for Other Services       Precautions / Restrictions Precautions Precautions: Knee;Fall Required Braces or Orthoses: Knee Immobilizer - Left Knee Immobilizer - Left: Discontinue once straight leg raise with < 10 degree lag(Pt performed IND SLR this am) Restrictions Weight Bearing Restrictions: No Other Position/Activity Restrictions: WBAT      Mobility  Bed Mobility Overal bed mobility: Needs Assistance Bed Mobility: Supine to Sit     Supine to sit: Min guard     General bed mobility comments: min guard for L LE  Transfers Overall transfer level: Needs assistance   Transfers: Sit to/from Stand Sit to Stand: Min assist         General transfer comment: cues for LE management and use of UEs to self assist  Ambulation/Gait Ambulation/Gait assistance: Min assist Gait Distance (Feet): 75 Feet Assistive device: Rolling walker (2 wheeled) Gait Pattern/deviations: Step-to pattern;Decreased step length - right;Decreased step length - left;Shuffle;Trunk flexed Gait velocity: decr   General Gait Details: cues for posture, sequence and position from [employer] Mobility    Modified Rankin (Stroke Patients Only)       Balance Overall balance assessment: Mild deficits observed, not formally tested                                            Pertinent Vitals/Pain Pain Assessment: 0-10 Pain Score: 4  Pain Location: L knee Pain Descriptors / Indicators: Aching;Sore Pain Intervention(s): Limited activity within patient's tolerance;Monitored during session;Premedicated before session;Ice applied    Home Living Family/patient expects to be discharged to:: Private residence Living Arrangements: Spouse/significant other;Children Available Help at Discharge: Family Type of Home: House Home Access: Ramped entrance     [place]: Able to live on main level with bedroom/bathroom Home Equipment: [place] - 2 wheels;Cane - single point;Shower seat      Prior Function Level of Independence: Independent with assistive device(s)         Comments: using cane for movility     Hand Dominance        Extremity/Trunk Assessment   Upper Extremity Assessment Upper Extremity Assessment: Overall WFL for tasks assessed    Lower Extremity Assessment Lower Extremity Assessment: LLE deficits/detail LLE Deficits / Details: 3/5 quads with IND SLR; AAROM at knee -10 - 35       Communication   Communication: No difficulties  Cognition Arousal/Alertness: Awake/alert Behavior During Therapy: WFL for tasks assessed/performed Overall Cognitive Status: Within Functional Limits for tasks assessed                                        General Comments  Exercises Total Joint Exercises Ankle Circles/Pumps: AROM;Both;15 reps;Supine Quad Sets: AROM;Both;10 reps;Supine Heel Slides: AAROM;Left;10 reps;Supine Straight Leg Raises: AAROM;AROM;Left;15 reps;Supine   Assessment/Plan    PT Assessment Patient needs continued PT services  PT Problem List Decreased strength;Decreased range of motion;Decreased activity tolerance;Decreased balance;Decreased mobility;Decreased knowledge of use of DME;Pain       PT Treatment Interventions DME instruction;Gait training;Stair training;Functional mobility  training;Therapeutic activities;Therapeutic exercise;Patient/family education    PT Goals (Current goals can be found in the Care Plan section)  Acute Rehab PT Goals Patient Stated Goal: Regain IND PT Goal Formulation: With patient Time For Goal Achievement: 01/21/18 Potential to Achieve Goals: Good    Frequency 7X/week   Barriers to discharge        Co-evaluation               AM-PAC PT "6 Clicks" Daily Activity  Outcome Measure Difficulty turning over in bed (including adjusting bedclothes, sheets and blankets)?: A Lot Difficulty moving from lying on back to sitting on the side of the bed? : A Lot Difficulty sitting down on and standing up from a chair with arms (e.g., wheelchair, bedside commode, etc,.)?: A Lot Help needed moving to and from a bed to chair (including a wheelchair)?: A Little Help needed walking in hospital room?: A Little Help needed climbing 3-5 steps with a railing? : A Little 6 Click Score: 15    End of Session Equipment Utilized During Treatment: Gait belt Activity Tolerance: Patient tolerated treatment well Patient left: in chair;with call bell/phone within reach Nurse Communication: Mobility status PT Visit Diagnosis: Difficulty in walking, not elsewhere classified (R26.2)    Time: 9937-1696 PT Time Calculation (min) (ACUTE ONLY): 35 min   Charges:   PT Evaluation $PT Eval Low Complexity: 1 Low PT Treatments $Therapeutic Exercise: 8-22 mins   PT G Codes:        Pg [phone]   Denni France 01/14/2018, 10:28 AM

## 2018-01-14 NOTE — Consult Note (Signed)
   [facility] CM Inpatient Consult   01/14/2018  Joanna Willis [birth year] [id number]   Went to bedside to speak with Mrs. Joanna Willis on behalf of [employer] [place] employees/dependents with [facility] insurance. However, she was resting soundly at the time. Did not want to disturb. Left 24-hr nurse advice line magnet at bedside.   Marthenia Rolling, MSN-Ed, RN,BSN [facility] Liaison [phone]

## 2018-01-14 NOTE — Progress Notes (Signed)
   Subjective: 1 Day Post-Op Procedure(s) (LRB): Revision of Left knee unicompartmental arthroplasty to total knee arthroplasty (Left) Patient reports pain as mild.   Patient seen in rounds with Dr. Wynelle Link. Patient is well, and has had no acute complaints or problems other than soreness in the left knee. Denies chest pain, SOB, or calf pain. Foley catheter to be removed this AM.  We will start therapy today.   Objective: Vital signs in last 24 hours: Temp:  [97.7 F (36.5 C)-98.3 F (36.8 C)] 98.3 F (36.8 C) (07/25 0618) Pulse Rate:  [56-83] 68 (07/25 0618) Resp:  [8-23] 16 (07/25 0618) BP: (93-149)/(58-103) 102/58 (07/25 0618) SpO2:  [97 %-100 %] 97 % (07/25 0618) Weight:  [72.6 kg (160 lb)] 72.6 kg (160 lb) (07/24 1252)  Intake/Output from previous day:  Intake/Output Summary (Last 24 hours) at 01/14/2018 0755 Last data filed at 01/14/2018 0600 Gross per 24 hour  Intake 3459.5 ml  Output 1915 ml  Net 1544.5 ml     Labs: Recent Labs    01/14/18 0541  HGB 13.0   Recent Labs    01/14/18 0541  WBC 10.3  RBC 4.28  HCT 40.0  PLT 276   Recent Labs    01/14/18 0541  NA 139  K 4.4  CL 101  CO2 30  BUN 9  CREATININE 0.69  GLUCOSE 161*  CALCIUM 8.8*   Exam: General - Patient is Alert and Oriented Extremity - Neurologically intact Neurovascular intact Sensation intact distally Dorsiflexion/Plantar flexion intact Dressing - dressing C/D/I Motor Function - intact, moving foot and toes well on exam.   Past Medical History:  Diagnosis Date  . Arthritis   . Depression   . GERD (gastroesophageal reflux disease)   . Hypercholesteremia   . PONV (postoperative nausea and vomiting)   . Postmenopausal   . Wears glasses     Assessment/Plan: 1 Day Post-Op Procedure(s) (LRB): Revision of Left knee unicompartmental arthroplasty to total knee arthroplasty (Left) Principal Problem:   S/P left unicompartmental knee replacement Active Problems:   OA  (osteoarthritis) of knee  Estimated body mass index is 25.82 kg/m as calculated from the following:   Height as of this encounter: 5\' 6"  (1.676 m).   Weight as of this encounter: 72.6 kg (160 lb). Advance diet Up with therapy  Anticipated LOS equal to or greater than 2 midnights due to - Age 18 and older with one or more of the following:  - Obesity  - Expected need for hospital services (PT, OT, Nursing) required for safe  discharge  - Anticipated need for postoperative skilled nursing care or inpatient rehab  - Active co-morbidities: None OR   - Unanticipated findings during/Post Surgery: None  - Patient is a high risk of re-admission due to: None    DVT Prophylaxis - Xarelto Weight bearing as tolerated. D/C O2 and pulse ox and try on room air. Hemovac pulled without difficulty, will begin therapy today.  Plan is to go Home after hospital stay.  Plan for discharge tomorrow if progresses with therapy and meeting goals.  Theresa Duty, PA-C Orthopedic Surgery 01/14/2018, 7:55 AM

## 2018-01-14 NOTE — Progress Notes (Signed)
Physical Therapy Treatment Patient Details Name: Joanna Willis MRN: [id number] DOB: [birth year] Today's Date: 01/14/2018    History of Present Illness Pt s/p L TKR - converted from UKR     PT Comments    Pt with more nausea prior to and during session today. Pt with increased tolerance for ambulation this session, limited by nausea and fatigue as opposed to pain. Mod verbal cuing during gait to avoid hip hiking to compensate for lack of L knee flexion, stepping into RW. Pt continues to require skilled PT acutely to address deficits.    Follow Up Recommendations  Follow surgeon's recommendation for DC plan and follow-up therapies     Equipment Recommendations       Recommendations for Other Services       Precautions / Restrictions Precautions Precautions: Knee;Fall Knee Immobilizer - Left: (Pt performed IND SLR this am) Restrictions Other Position/Activity Restrictions: WBAT    Mobility  Bed Mobility                  Transfers Overall transfer level: Needs assistance Equipment used: Rolling walker (2 wheeled) Transfers: Sit to/from Stand Sit to Stand: Min assist         General transfer comment: cues for LE management and use of UEs to self assist  Ambulation/Gait Ambulation/Gait assistance: Min assist Gait Distance (Feet): 130 Feet Assistive device: Rolling walker (2 wheeled) Gait Pattern/deviations: Step-to pattern;Decreased step length - right;Decreased step length - left;Shuffle;Trunk flexed Gait velocity: decr   General Gait Details: cues for positioning in RW, bending knee to avoid hip hiking on R    Stairs             Wheelchair Mobility    Modified Rankin (Stroke Patients Only)       Balance Overall balance assessment: Mild deficits observed, not formally tested                                          Cognition Arousal/Alertness: Awake/alert Behavior During Therapy: WFL for tasks assessed/performed Overall  Cognitive Status: Within Functional Limits for tasks assessed                                        Exercises      General Comments        Pertinent Vitals/Pain Pain Assessment: 0-10 Pain Score: 4  Pain Descriptors / Indicators: Aching;Burning Pain Intervention(s): Monitored during session;Ice applied;Limited activity within patient's tolerance    Home Living                      Prior Function            PT Goals (current goals can now be found in the care plan section) Acute Rehab PT Goals Patient Stated Goal: progress mobility  PT Goal Formulation: With patient Time For Goal Achievement: 01/21/18 Potential to Achieve Goals: Good Progress towards PT goals: Progressing toward goals    Frequency    7X/week      PT Plan Current plan remains appropriate    Co-evaluation              AM-PAC PT "6 Clicks" Daily Activity  Outcome Measure  Difficulty turning over in bed (including adjusting bedclothes, sheets and blankets)?: Unable Difficulty moving from  lying on back to sitting on the side of the bed? : Unable Difficulty sitting down on and standing up from a chair with arms (e.g., wheelchair, bedside commode, etc,.)?: Unable Help needed moving to and from a bed to chair (including a wheelchair)?: A Little Help needed walking in hospital room?: A Little Help needed climbing 3-5 steps with a railing? : A Little 6 Click Score: 12    End of Session Equipment Utilized During Treatment: Gait belt Activity Tolerance: Patient tolerated treatment well;Patient limited by fatigue;Other (comment)(nausea ) Patient left: in bed;with call bell/phone within reach;Other (comment)(with ice ) Nurse Communication: Mobility status PT Visit Diagnosis: Difficulty in walking, not elsewhere classified (R26.2)     Time: 1400-1420 PT Time Calculation (min) (ACUTE ONLY): 20 min  Charges:  $Gait Training: 8-22 mins                    [place] Marney PT  DPT   Tyah Acord D Dariya Gainer 01/14/2018, 2:55 PM

## 2018-01-15 LAB — CBC
HEMATOCRIT: 37.3 % (ref 36.0–46.0)
Hemoglobin: 12 g/dL (ref 12.0–15.0)
MCH: 30.2 pg (ref 26.0–34.0)
MCHC: 32.2 g/dL (ref 30.0–36.0)
MCV: 94 fL (ref 78.0–100.0)
Platelets: 239 10*3/uL (ref 150–400)
RBC: 3.97 MIL/uL (ref 3.87–5.11)
RDW: 12.9 % (ref 11.5–15.5)
WBC: 9.6 10*3/uL (ref 4.0–10.5)

## 2018-01-15 LAB — BASIC METABOLIC PANEL
Anion gap: 6 (ref 5–15)
BUN: 9 mg/dL (ref 8–23)
CO2: 29 mmol/L (ref 22–32)
Calcium: 8.6 mg/dL — ABNORMAL LOW (ref 8.9–10.3)
Chloride: 103 mmol/L (ref 98–111)
Creatinine, Ser: 0.66 mg/dL (ref 0.44–1.00)
GFR calc Af Amer: >60 mL/min (ref >60)
GFR calc non Af Amer: >60 mL/min (ref >60)
GLUCOSE: 109 mg/dL — AB (ref 70–99)
Potassium: 3.7 mmol/L (ref 3.5–5.1)
Sodium: 138 mmol/L (ref 135–145)

## 2018-01-15 MED ORDER — METHOCARBAMOL 500 MG PO TABS: 500.0000 mg | ORAL_TABLET | Freq: Four times a day (QID) | ORAL | 0 refills | Status: DC | PRN

## 2018-01-15 MED ORDER — OXYCODONE HCL 5 MG PO TABS: 5.0000 mg | ORAL_TABLET | Freq: Four times a day (QID) | ORAL | 0 refills | Status: DC | PRN

## 2018-01-15 MED ORDER — ONDANSETRON HCL 4 MG PO TABS: 4.0000 mg | ORAL_TABLET | Freq: Four times a day (QID) | ORAL | 0 refills | Status: DC | PRN

## 2018-01-15 MED ORDER — GABAPENTIN 300 MG PO CAPS: 300.0000 mg | ORAL_CAPSULE | Freq: Three times a day (TID) | ORAL | 0 refills | Status: DC

## 2018-01-15 MED ORDER — HYDROMORPHONE HCL 2 MG PO TABS: 2.0000 mg | ORAL_TABLET | Freq: Four times a day (QID) | ORAL | 0 refills | Status: DC | PRN

## 2018-01-15 MED ORDER — DIAZEPAM 5 MG PO TABS
5.0000 mg | ORAL_TABLET | Freq: Four times a day (QID) | ORAL | Status: DC | PRN
  Administered 2018-01-15 – 2018-01-16 (×4): 5 mg via ORAL
  Filled 2018-01-15 (×4): qty 1

## 2018-01-15 MED ORDER — HYDROMORPHONE HCL 2 MG PO TABS
2.0000 mg | ORAL_TABLET | ORAL | Status: DC | PRN
  Administered 2018-01-15 – 2018-01-16 (×5): 4 mg via ORAL
  Administered 2018-01-16: 2 mg via ORAL
  Filled 2018-01-15 (×5): qty 2
  Filled 2018-01-15: qty 1

## 2018-01-15 MED ORDER — RIVAROXABAN 10 MG PO TABS: 10.0000 mg | ORAL_TABLET | Freq: Every day | ORAL | 0 refills | Status: DC

## 2018-01-15 NOTE — Progress Notes (Signed)
PT Cancellation Note  Patient Details Name: DEMMI SINDT MRN: [id number] DOB: 06/11/1956   Cancelled Treatment:     PT attempted x 2 this am but deferred 2* pt elevated pain level.  Pt in bed and shaking, warm blanket provided, ice packs refreshed and RN aware and in to assess pt.  Will follow.   Kashina Mecum 01/15/2018, 10:52 AM

## 2018-01-15 NOTE — Care Management Note (Signed)
Case Management Note  Patient Details  Name: Joanna Willis MRN: [id number] Date of Birth: April 02, 1956  Subjective/Objective: Noted per surgeons note-f/ otpt PT appt already set. No further CM needs.                   Action/Plan:d/c home.   Expected Discharge Date:  01/15/18               Expected Discharge Plan:  OP Rehab  In-House Referral:     Discharge planning Services  CM Consult  Post Acute Care Choice:    Choice offered to:     DME Arranged:    DME Agency:     HH Arranged:    HH Agency:     Status of Service:  Completed, signed off  If discussed at [employer] of Stay Meetings, dates discussed:    Additional Comments:  Dessa Phi, RN 01/15/2018, 12:00 PM

## 2018-01-15 NOTE — Progress Notes (Signed)
   Subjective: 2 Days Post-Op Procedure(s) (LRB): Revision of Left knee unicompartmental arthroplasty to total knee arthroplasty (Left) Patient reports pain as mild.   Patient seen in rounds with Dr. Wynelle Link. Patient is well, and has had no acute complaints or problems other than pain in the left knee and nausea after taking Dilaudid. Reports that the nausea has subsided. States she slept well last night and is ready to go home today. Voiding without difficulty and positive flatus. Denies chest pain, SOB or calf pain. Plan is to go Home after hospital stay.  Objective: Vital signs in last 24 hours: Temp:  [98.4 F (36.9 C)-99.3 F (37.4 C)] 99.3 F (37.4 C) (07/26 0538) Pulse Rate:  [71-77] 77 (07/26 0538) Resp:  [15-16] 15 (07/26 0538) BP: (110-122)/(67-76) 122/76 (07/26 0538) SpO2:  [97 %-100 %] 100 % (07/26 0538)  Intake/Output from previous day:  Intake/Output Summary (Last 24 hours) at 01/15/2018 0743 Last data filed at 01/15/2018 0600 Gross per 24 hour  Intake 1587.5 ml  Output 1100 ml  Net 487.5 ml    Labs: Recent Labs    01/14/18 0541 01/15/18 0512  HGB 13.0 12.0   Recent Labs    01/14/18 0541 01/15/18 0512  WBC 10.3 9.6  RBC 4.28 3.97  HCT 40.0 37.3  PLT 276 239   Recent Labs    01/14/18 0541 01/15/18 0512  NA 139 138  K 4.4 3.7  CL 101 103  CO2 30 29  BUN 9 9  CREATININE 0.69 0.66  GLUCOSE 161* 109*  CALCIUM 8.8* 8.6*   Exam: General - Patient is Alert and Oriented Extremity - Neurologically intact Neurovascular intact Sensation intact distally Dorsiflexion/Plantar flexion intact Dressing/Incision - clean, dry, no drainage Motor Function - intact, moving foot and toes well on exam.   Past Medical History:  Diagnosis Date  . Arthritis   . Depression   . GERD (gastroesophageal reflux disease)   . Hypercholesteremia   . PONV (postoperative nausea and vomiting)   . Postmenopausal   . Wears glasses     Assessment/Plan: 2 Days Post-Op  Procedure(s) (LRB): Revision of Left knee unicompartmental arthroplasty to total knee arthroplasty (Left) Principal Problem:   S/P left unicompartmental knee replacement Active Problems:   OA (osteoarthritis) of knee  Estimated body mass index is 25.82 kg/m as calculated from the following:   Height as of this encounter: 5\' 6"  (1.676 m).   Weight as of this encounter: 72.6 kg (160 lb). Up with therapy D/C IV fluids  DVT Prophylaxis - Xarelto Weight-bearing as tolerated  Plan for discharge after two sessions of therapy today. Scheduled for outpatient physical therapy at [facility] beginning Monday. Follow-up in the office in 2 weeks with Dr. Wynelle Link.  Joanna Duty, PA-C Orthopedic Surgery 01/15/2018, 7:43 AM

## 2018-01-15 NOTE — Progress Notes (Signed)
Physical Therapy Treatment Patient Details Name: Joanna Willis MRN: [id number] DOB: 17-Sep-1955 Today's Date: 01/15/2018    History of Present Illness Pt s/p L TKR - converted from UKR     PT Comments    Pt with improved pain control this pm and willing to ambulate.  Therex deferred at pt request but pt states may consider CPM this evening.   Follow Up Recommendations  Follow surgeon's recommendation for DC plan and follow-up therapies     Equipment Recommendations  None recommended by PT    Recommendations for Other Services       Precautions / Restrictions Precautions Precautions: Knee;Fall Required Braces or Orthoses: Knee Immobilizer - Left Knee Immobilizer - Left: Discontinue once straight leg raise with < 10 degree lag Restrictions Weight Bearing Restrictions: No Other Position/Activity Restrictions: WBAT    Mobility  Bed Mobility Overal bed mobility: Needs Assistance Bed Mobility: Supine to Sit     Supine to sit: Min assist     General bed mobility comments: assist for L LE  Transfers Overall transfer level: Needs assistance Equipment used: Rolling walker (2 wheeled) Transfers: Sit to/from Stand Sit to Stand: Min assist         General transfer comment: cues for LE management and use of UEs to self assist  Ambulation/Gait Ambulation/Gait assistance: Min assist Gait Distance (Feet): 100 Feet(and 15' into bathroom) Assistive device: Rolling walker (2 wheeled) Gait Pattern/deviations: Step-to pattern;Decreased step length - right;Decreased step length - left;Shuffle;Trunk flexed Gait velocity: decr   General Gait Details: cues for positioning in RW, bending knee to avoid hip hiking on R    Stairs             Wheelchair Mobility    Modified Rankin (Stroke Patients Only)       Balance Overall balance assessment: Mild deficits observed, not formally tested                                          Cognition  Arousal/Alertness: Awake/alert Behavior During Therapy: WFL for tasks assessed/performed Overall Cognitive Status: Within Functional Limits for tasks assessed                                        Exercises      General Comments        Pertinent Vitals/Pain Pain Assessment: 0-10 Pain Score: 6  Pain Location: L knee Pain Descriptors / Indicators: Aching;Burning Pain Intervention(s): Limited activity within patient's tolerance;Monitored during session;Premedicated before session;Ice applied    Home Living                      Prior Function            PT Goals (current goals can now be found in the care plan section) Acute Rehab PT Goals Patient Stated Goal: progress mobility  PT Goal Formulation: With patient Time For Goal Achievement: 01/21/18 Potential to Achieve Goals: Good Progress towards PT goals: Progressing toward goals    Frequency    7X/week      PT Plan Current plan remains appropriate    Co-evaluation              AM-PAC PT "6 Clicks" Daily Activity  Outcome Measure  Difficulty turning over in bed (including  adjusting bedclothes, sheets and blankets)?: Unable Difficulty moving from lying on back to sitting on the side of the bed? : Unable Difficulty sitting down on and standing up from a chair with arms (e.g., wheelchair, bedside commode, etc,.)?: Unable Help needed moving to and from a bed to chair (including a wheelchair)?: A Little Help needed walking in hospital room?: A Little Help needed climbing 3-5 steps with a railing? : A Lot 6 Click Score: 11    End of Session Equipment Utilized During Treatment: Gait belt Activity Tolerance: Patient tolerated treatment well;Patient limited by fatigue;Other (comment) Patient left: with call bell/phone within reach;in chair Nurse Communication: Mobility status PT Visit Diagnosis: Difficulty in walking, not elsewhere classified (R26.2)     Time: 4680-3212 PT Time  Calculation (min) (ACUTE ONLY): 25 min  Charges:  $Gait Training: 8-22 mins $Therapeutic Activity: 8-22 mins                     Pg [phone]    Kayon Dozier 01/15/2018, 4:34 PM

## 2018-01-15 NOTE — Plan of Care (Signed)
Plan of care discussed.   

## 2018-01-16 LAB — CBC
HCT: 36.6 % (ref 36.0–46.0)
Hemoglobin: 11.6 g/dL — ABNORMAL LOW (ref 12.0–15.0)
MCH: 30.1 pg (ref 26.0–34.0)
MCHC: 31.7 g/dL (ref 30.0–36.0)
MCV: 95.1 fL (ref 78.0–100.0)
PLATELETS: 240 10*3/uL (ref 150–400)
RBC: 3.85 MIL/uL — ABNORMAL LOW (ref 3.87–5.11)
RDW: 13.2 % (ref 11.5–15.5)
WBC: 7.8 10*3/uL (ref 4.0–10.5)

## 2018-01-16 NOTE — Progress Notes (Signed)
Joanna Willis  MRN: [id number] DOB/Age: 1956-01-16 62 y.o.  Orthopedics Procedure: Procedure(s) (LRB): Revision of Left knee unicompartmental arthroplasty to total knee arthroplasty (Left)     Subjective: Feels much better today. Pain control improved upon change of meds yesterday. Therapies also much improved  Vital Signs Temp:  [99.4 F (37.4 C)-100.1 F (37.8 C)] 99.8 F (37.7 C) (07/27 0535) Pulse Rate:  [91-96] 96 (07/27 0535) Resp:  [14-16] 16 (07/27 0535) BP: (116-134)/(68-82) 117/68 (07/27 0535) SpO2:  [95 %-100 %] 99 % (07/27 0535)  Lab Results Recent Labs    01/15/18 0512 01/16/18 0520  WBC 9.6 7.8  HGB 12.0 11.6*  HCT 37.3 36.6  PLT 239 240   BMET Recent Labs    01/14/18 0541 01/15/18 0512  NA 139 138  K 4.4 3.7  CL 101 103  CO2 30 29  GLUCOSE 161* 109*  BUN 9 9  CREATININE 0.69 0.66  CALCIUM 8.8* 8.6*   INR  Date Value Ref Range Status  01/04/2018 0.89  Final    Comment:    Performed at [facility], [place] [address], [place], [place] [address]     Exam Left knee incision dry NVI  Min swelling        Plan DC home today Outpatient PT next week  Jenetta Loges PA-C  01/16/2018, 8:47 AM Contact # [phone]

## 2018-01-16 NOTE — Care Management Note (Signed)
Case Management Note  Patient Details  Name: Joanna Willis MRN: [id number] Date of Birth: Jan 09, 1956  Subjective/Objective:   Revision of Left Knee Unicompartmental Arthroplasty                 Action/Plan: NCM spoke to pt and arranged for OPPT. Has RW and 3n1 at home.   Expected Discharge Date:  01/16/18               Expected Discharge Plan:  OP Rehab  In-House Referral:  NA  Discharge planning Services  CM Consult  Post Acute Care Choice:  NA Choice offered to:  NA  DME Arranged:  N/A DME Agency:     HH Arranged:  NA HH Agency:  NA  Status of Service:  Completed, signed off  If discussed at [place] of Stay Meetings, dates discussed:    Additional Comments:  Erenest Rasher, RN 01/16/2018, 11:55 AM

## 2018-01-16 NOTE — Progress Notes (Signed)
Physical Therapy Treatment Patient Details Name: Joanna Willis MRN: [id number] DOB: 1955-09-13 Today's Date: 01/16/2018    History of Present Illness Pt s/p L TKR - converted from UKR     PT Comments    Pt continues motivated and progressing slowly with moderate improvement in pain control.  Reviewed home therex with written instruction provided.   Follow Up Recommendations  Follow surgeon's recommendation for DC plan and follow-up therapies     Equipment Recommendations  None recommended by PT    Recommendations for Other Services       Precautions / Restrictions Precautions Precautions: Knee;Fall Required Braces or Orthoses: Knee Immobilizer - Left Knee Immobilizer - Left: Discontinue once straight leg raise with < 10 degree lag Restrictions Weight Bearing Restrictions: No Other Position/Activity Restrictions: WBAT    Mobility  Bed Mobility Overal bed mobility: Needs Assistance Bed Mobility: Supine to Sit     Supine to sit: Min assist     General bed mobility comments: assist for L LE  Transfers Overall transfer level: Needs assistance Equipment used: Rolling walker (2 wheeled) Transfers: Sit to/from Stand Sit to Stand: Min guard         General transfer comment: cues for LE management and use of UEs to self assist  Ambulation/Gait Ambulation/Gait assistance: Min guard Gait Distance (Feet): 150 Feet Assistive device: Rolling walker (2 wheeled) Gait Pattern/deviations: Step-to pattern;Decreased step length - right;Decreased step length - left;Shuffle;Trunk flexed     General Gait Details: cues for posture, position from [employer]             Wheelchair Mobility    Modified Rankin (Stroke Patients Only)       Balance Overall balance assessment: Mild deficits observed, not formally tested                                          Cognition Arousal/Alertness: Awake/alert Behavior During Therapy: WFL for tasks  assessed/performed Overall Cognitive Status: Within Functional Limits for tasks assessed                                        Exercises Total Joint Exercises Ankle Circles/Pumps: AROM;Both;15 reps;Supine Quad Sets: AROM;Both;10 reps;Supine Heel Slides: AAROM;Left;10 reps;Supine Straight Leg Raises: AAROM;Left;Supine;10 reps    General Comments        Pertinent Vitals/Pain Pain Assessment: 0-10 Pain Score: 4  Pain Location: L knee Pain Descriptors / Indicators: Aching;Burning Pain Intervention(s): Limited activity within patient's tolerance;Monitored during session;Premedicated before session;Ice applied    Home Living                      Prior Function            PT Goals (current goals can now be found in the care plan section) Acute Rehab PT Goals Patient Stated Goal: progress mobility  PT Goal Formulation: With patient Time For Goal Achievement: 01/21/18 Potential to Achieve Goals: Good Progress towards PT goals: Progressing toward goals    Frequency    7X/week      PT Plan Current plan remains appropriate    Co-evaluation              AM-PAC PT "6 Clicks" Daily Activity  Outcome Measure  Difficulty turning over in  bed (including adjusting bedclothes, sheets and blankets)?: Unable Difficulty moving from lying on back to sitting on the side of the bed? : Unable Difficulty sitting down on and standing up from a chair with arms (e.g., wheelchair, bedside commode, etc,.)?: Unable Help needed moving to and from a bed to chair (including a wheelchair)?: A Little Help needed walking in hospital room?: A Little Help needed climbing 3-5 steps with a railing? : A Lot 6 Click Score: 11    End of Session Equipment Utilized During Treatment: Gait belt Activity Tolerance: Patient tolerated treatment well;Patient limited by fatigue;Other (comment) Patient left: with call bell/phone within reach;in chair Nurse Communication:  Mobility status PT Visit Diagnosis: Difficulty in walking, not elsewhere classified (R26.2)     Time: 7195-9747 PT Time Calculation (min) (ACUTE ONLY): 41 min  Charges:  $Gait Training: 23-37 mins $Therapeutic Exercise: 8-22 mins                     Pg [phone]    Samuel Rittenhouse 01/16/2018, 12:29 PM

## 2018-01-16 NOTE — Progress Notes (Signed)
Pt discharged at this time but on 01/16/2018. Transported from floor via w/c to car for drive to home. Belongings & family with pt. No changes in assessment. Karim Aiello, [employer]

## 2018-01-18 ENCOUNTER — Encounter: Payer: Self-pay | Admitting: Physical Therapy

## 2018-01-18 ENCOUNTER — Ambulatory Visit: Payer: 59 | Attending: Student | Admitting: Physical Therapy

## 2018-01-18 ENCOUNTER — Other Ambulatory Visit: Payer: Self-pay | Admitting: *Deleted

## 2018-01-18 DIAGNOSIS — M6281 Muscle weakness (generalized): Secondary | ICD-10-CM | POA: Insufficient documentation

## 2018-01-18 DIAGNOSIS — R2689 Other abnormalities of gait and mobility: Secondary | ICD-10-CM | POA: Diagnosis not present

## 2018-01-18 DIAGNOSIS — M25662 Stiffness of left knee, not elsewhere classified: Secondary | ICD-10-CM | POA: Diagnosis not present

## 2018-01-18 DIAGNOSIS — M25562 Pain in left knee: Secondary | ICD-10-CM | POA: Insufficient documentation

## 2018-01-18 DIAGNOSIS — R262 Difficulty in walking, not elsewhere classified: Secondary | ICD-10-CM | POA: Diagnosis not present

## 2018-01-18 DIAGNOSIS — R29898 Other symptoms and signs involving the musculoskeletal system: Secondary | ICD-10-CM | POA: Diagnosis not present

## 2018-01-18 NOTE — Therapy (Addendum)
[facility] [address]  [place], [place], [address] Phone: [phone]   Fax:  [phone]  Physical Therapy Treatment  Patient Details  Name: Joanna Willis MRN: [id number] Date of Birth: 07-21-1955 Referring Provider: Theresa Duty, PA-C   Encounter Date: 01/18/2018  PT End of Session - 01/18/18 1110    Visit Number  1    Number of Visits  18    Date for PT Re-Evaluation  03/01/18    PT Start Time  1107    PT Stop Time  6440    PT Time Calculation (min)  46 min    Activity Tolerance  Patient tolerated treatment well;Patient limited by pain;Other (comment) Limited tolerance for static positions    Behavior During Therapy  [facility] for tasks assessed/performed Emotional when discussing returning to work       Past Medical History:  Diagnosis Date  . Arthritis   . Depression   . GERD (gastroesophageal reflux disease)   . Hypercholesteremia   . PONV (postoperative nausea and vomiting)   . Postmenopausal   . Wears glasses     Past Surgical History:  Procedure Laterality Date  . ABDOMINAL HYSTERECTOMY  2005   did not remove ovaries  . CESAREAN SECTION  2000  . COLONOSCOPY    . CONVERSION TO TOTAL KNEE Left 01/13/2018   Procedure: Revision of Left knee unicompartmental arthroplasty to total knee arthroplasty;  Surgeon: [name], MD;  Location: [facility];  Service: Orthopedics;  Laterality: Left;  . FOOT ARTHRODESIS  1995   right  . KNEE ARTHROSCOPY WITH MEDIAL MENISECTOMY Left 05/17/2014   Procedure: LEFT ARTHROSCOPY KNEE WITH PARTIAL MEDIAL MENISECTOMY, CHONDROPLASTY OF LATERAL FEMORAL CONDYLE;  Surgeon: Alta Corning, MD;  Location: [facility];  Service: Orthopedics;  Laterality: Left;  . PARTIAL KNEE ARTHROPLASTY Left 01/21/2017   Procedure: UNICOMPARTMENTAL LEFT KNEE;  Surgeon: [name], MD;  Location: [facility];  Service: Orthopedics;  Laterality: Left;  . Revision left knee     Dr.  Wynelle Link 01-13-18    There were no vitals filed for this visit.  Subjective Assessment - 01/18/18 1200    Subjective  Pt states that she has been out of work since March as the MD states he thought she had a stress fracture. At that time she was out of work for 6 weeks. Pain never decreased and eventually it was found that the top of the tibia was necrotic from the previous partial knee replacement. Pt is hoping to return to work on October 21st as her job is being held for her. Pt has various animals at home that she has been unable to care for due to increasing knee pain over the last year and she states she has not been able to use a wheelbarrow "in forever". Lives with her husband and two grown children, one is returning to school in August. Pt's house recently sold and will be moving into a new house in September.     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  Yes    Pain Score  7  Can get up to a 10 without pain medication    Pain Location  Knee    Pain Orientation  Left    Pain Descriptors / Indicators  Aching;Sore;Tender    Pain Type  Surgical pain  Pain Onset  In the past 7 days    Pain Frequency  Constant    Aggravating Factors   Knee flexion, sitting    Pain Relieving Factors  Medication, Ice    Effect of Pain on Daily Activities  Unable to perform all ADLs to [facility]          [facility] PT Assessment - 01/18/18 0001      Assessment   Medical Diagnosis  L TKA    Referring Provider  Theresa Duty, PA-C    Onset Date/Surgical Date  01/13/18    Next MD Visit  01/28/2018    Prior Therapy  Yes      Balance Screen   Has the patient fallen in the past 6 months  No    Has the patient had a decrease in activity level because of a fear of falling?   No    Is the patient reluctant to leave their home because of a fear of falling?   No      Home Environment   Living  Environment  Private residence    Living Arrangements  Spouse/significant other;Children    Type of [place] - 2 wheels    Additional Comments  Will be moving in September      Prior Function   Level of Independence  Independent    Vocation  Full time employment    Vocation Requirements  Cardiac rehab nurse    Leisure  Garden, [place], take care of animals including goats and dogs      Observation/Other Assessments   Observations  Pt arrived today wearing TED hose    Skin Integrity  Surgical scar looks to be healing well with no abnormalities observed.     Focus on Therapeutic Outcomes (FOTO)   Intake - Status 28% (72% limited); Predicted - status 50%, (limited 50%)      Posture/Postural Control   Posture/Postural Control  Postural limitations    Postural Limitations  Weight shift right      ROM / Strength   AROM / PROM / Strength  AROM;Strength;PROM      AROM   Overall AROM   --    Overall AROM Comments  Pt required assistance to initiate movement; measured in supine    AROM Assessment Site  Knee    Right/Left Knee  Left    Left Knee Extension  5    Left Knee Flexion  35      PROM   Overall PROM Comments  Measured in sitting; VC to prevent L hip compensation     PROM Assessment Site  Knee    Right/Left Knee  Left    Left Knee Flexion  54      Strength   Overall Strength  Due to pain    Overall Strength Comments  Not tested today secondary to pt discomfort/pain levels, to be assessed when tolerated      Ambulation/Gait   Ambulation/Gait  Yes    Ambulation/Gait Assistance  6: Modified independent (Device/Increase time)    Ambulation Distance (Feet)  60 Feet    Assistive device  Rolling walker    Gait Pattern  Step-to pattern;Decreased stride length;Decreased stance time - left;Decreased step length - right;Decreased step length - left;Decreased hip/knee flexion - left;Decreased weight shift to left;Left steppage;Antalgic;Narrow base of  support;Poor foot clearance - left    Ambulation Surface  Level;Indoor    Gait Comments  Decreased gait velocity  with extra time required to navigate corners and door frames                   [facility] Adult PT Treatment/Exercise - 01/18/18 0001      Self-Care   Self-Care  RICE;Scar Mobilizations    RICE  Pt instructed to continue with ice and elevation as appropriate    Scar Mobilizations  PT suggested using a washcloth around the incision, not over the incision, to desensitize the skin to bedsheets       Exercises   Exercises  Knee/Hip      Knee/Hip Exercises: Seated   Long Arc Quad  Left;10 reps    Other Seated Knee/Hip Exercises  Heel Raises; 10 reps; VC for good seated position and to prevent hip comensation with movement    Other Seated Knee/Hip Exercises  Toe Raises; 10 reps      Modalities   Modalities  Vasopneumatic      Vasopneumatic   Number Minutes Vasopneumatic   10 minutes    Vasopnuematic Location   Knee    Vasopneumatic Pressure  Low    Vasopneumatic Temperature   lowest             PT Education - 01/18/18 1202    Education Details  Pt educated on results of evaluation, POC and HEP exercises     Person(s) Educated  Patient    Methods  Explanation;Verbal cues;Handout;Demonstration    Comprehension  Verbalized understanding;Returned demonstration       PT Short Term Goals - 01/18/18 1214      PT SHORT TERM GOAL #1   Title  Pt will be independent with initial HEP    Status  New    Target Date  02/08/18      PT SHORT TERM GOAL #2   Title  Pt will have 90 degrees of L knee AROM flexion    Status  New    Target Date  02/08/18        PT Long Term Goals - 01/18/18 1215      PT LONG TERM GOAL #1   Title  Pt to be independent with advanced HEP    Status  New    Target Date  03/01/18      PT LONG TERM GOAL #2   Title  Patient to demonstrate 0-120 degrees of AROM at L knee to improve gait and abilty to perform ADLs independently     Status  New    Target Date  03/01/18      PT LONG TERM GOAL #3   Title  Patient to report ability to take care of animals and garden independently with no increased pain.     Status  New    Target Date  03/01/18      PT LONG TERM GOAL #4   Title  Pt to report pain at worst 4/10 with activity     Status  New    Target Date  03/01/18      PT LONG TERM GOAL #5   Title  Pt will demonstrate normal gait pattern with LRAD in order to improve functional mobility and decrease fall risk    Status  New    Target Date  03/01/18      Additional Long Term Goals   Additional Long Term Goals  Yes      PT LONG TERM GOAL #6   Title  Pt will have LE strength globally of 4+/5 to  improve functional mobility and perform ADLs and job-related activities safely    Status  New    Target Date  03/01/18            Plan - 01/18/18 1148    Clinical Impression Statement  Pt is a 62 y/o F who presents to OP PT s/p L TKA on 01/13/2018. She presents with increased swelling, decreased range of motion, pain, gait abnormalities and impaired ability to perform ADLs. Surgical scar is healing well with no abnormalities noted. Her prognosis is positively impacted by her high motivation to return to PLOF and medical background, however is negatively impacted by the chronicity of symptoms experienced in the L knee.     History and Personal Factors relevant to plan of care:  Partial knee replacement previously     Clinical Presentation  Stable    Clinical Decision Making  Low    Rehab Potential  Good    PT Frequency  3x / week    PT Duration  6 weeks    PT Treatment/Interventions  ADLs/Self Care Home Management;Cryotherapy;Electrical Stimulation;Iontophoresis 4mg /ml Dexamethasone;Moist Heat;Ultrasound;DME Instruction;Gait training;Stair training;Functional mobility training;Therapeutic activities;Therapeutic exercise;Balance training;Neuromuscular re-education;Patient/family education;Manual techniques;Scar  mobilization;Passive range of motion;Dry needling;Taping;Vasopneumatic Device    PT Next Visit Plan  Assess pt LE strength as tolerated    Consulted and Agree with Plan of Care  Patient       Patient will benefit from skilled therapeutic intervention in order to improve the following deficits and impairments:  Abnormal gait, Decreased skin integrity, Hypomobility, Impaired sensation, Increased edema, Decreased activity tolerance, Decreased strength, Increased fascial restricitons, Pain, Decreased mobility, Difficulty walking, Increased muscle spasms, Decreased range of motion, Improper body mechanics, Impaired flexibility, Decreased balance  Visit Diagnosis: Acute pain of left knee - Plan: PT plan of care cert/re-cert  Difficulty in walking, not elsewhere classified - Plan: PT plan of care cert/re-cert  Other symptoms and signs involving the musculoskeletal system - Plan: PT plan of care cert/re-cert  Stiffness of left knee, not elsewhere classified - Plan: PT plan of care cert/re-cert  Muscle weakness (generalized) - Plan: PT plan of care cert/re-cert  Other abnormalities of gait and mobility - Plan: PT plan of care cert/re-cert     Problem List Patient Active Problem List   Diagnosis Date Noted  . S/P left unicompartmental knee replacement 01/13/2018  . OA (osteoarthritis) of knee 01/21/2017  . Left knee pain 04/19/2014  . BRONCHITIS, ACUTE 07/21/2010    Shirline Frees, SPT 01/18/2018, 7:20 PM  [facility] [address]  [place], [place], [address] Phone: [phone]   Fax:  [phone]  Name: CANDYCE GAMBINO MRN: [id number] Date of Birth: [birth year]

## 2018-01-18 NOTE — Patient Outreach (Addendum)
[facility]) Care Management  01/18/2018  MIKKA KISSNER April 29, 1956 [id number]   Unsuccessful initial telephone outreach attempt to complete transition of care assessment. Left message on home number requesting return call.  Fayetta was admitted to [facility] on 01/13/18 for conversion of left knee unicompartmental replacement to left total knee arthroplasty. She was discharged home on 01/16/18 with outpatient physical therapy at [facility].  Will mail unsuccessful outreach letter and advise Sonda Rumble [facility] telephonic RNCM that patient will need a second outreach attempt in 3-4 business days in patient does not return call to this [facility] today.  Barrington Ellison RN,CCM,CDE [place] Management Coordinator Office Phone [phone] Office Fax [phone]

## 2018-01-19 ENCOUNTER — Ambulatory Visit: Payer: 59

## 2018-01-19 DIAGNOSIS — M25662 Stiffness of left knee, not elsewhere classified: Secondary | ICD-10-CM

## 2018-01-19 DIAGNOSIS — R262 Difficulty in walking, not elsewhere classified: Secondary | ICD-10-CM

## 2018-01-19 DIAGNOSIS — R29898 Other symptoms and signs involving the musculoskeletal system: Secondary | ICD-10-CM

## 2018-01-19 DIAGNOSIS — R2689 Other abnormalities of gait and mobility: Secondary | ICD-10-CM | POA: Diagnosis not present

## 2018-01-19 DIAGNOSIS — M25562 Pain in left knee: Secondary | ICD-10-CM

## 2018-01-19 DIAGNOSIS — M6281 Muscle weakness (generalized): Secondary | ICD-10-CM | POA: Diagnosis not present

## 2018-01-19 NOTE — Discharge Summary (Signed)
Physician Discharge Summary   Patient ID: BRITLEY GASHI MRN: [id number] DOB/AGE: [birth year] 62 y.o.  Admit date: 01/13/2018 Discharge date: 01/15/2018  Primary Diagnosis: Failed unicompartmental arthroplasty, left knee   Admission Diagnoses:  Past Medical History:  Diagnosis Date  . Arthritis   . Depression   . GERD (gastroesophageal reflux disease)   . Hypercholesteremia   . PONV (postoperative nausea and vomiting)   . Postmenopausal   . Wears glasses    Discharge Diagnoses:   Principal Problem:   S/P left unicompartmental knee replacement Active Problems:   OA (osteoarthritis) of knee  Estimated body mass index is 25.82 kg/m as calculated from the following:   Height as of this encounter: _0  (1.676 m).   Weight as of this encounter: 72.6 kg (160 lb).  Procedure:  Procedure(s) (LRB): Revision of Left knee unicompartmental arthroplasty to total knee arthroplasty (Left)   Consults: None  HPI: The patient is a 62 year old female with a left knee medial unicompartmental arthroplasty done approximately a year ago.  She has had persistent pain now starting about 6 weeks postoperative.  She has had a workup including a bone  scan and plain x-rays and did not show any definitive loosening.  We have treated this nonoperatively throughout this entire time.  It is felt now that either she has loosening of the tibial component or perhaps some osteonecrosis around it.  She  presents now for revision to a total knee arthroplasty given the persistent pain and dysfunction.  Laboratory Data: Admission on 01/13/2018, Discharged on 01/15/2018  Component Date Value Ref Range Status  . WBC 01/14/2018 10.3  4.0 - 10.5 K/uL Final  . RBC 01/14/2018 4.28  3.87 - 5.11 MIL/uL Final  . Hemoglobin 01/14/2018 13.0  12.0 - 15.0 g/dL Final  . HCT 01/14/2018 40.0  36.0 - 46.0 % Final  . MCV 01/14/2018 93.5  78.0 - 100.0 fL Final  . MCH 01/14/2018 30.4  26.0 - 34.0 pg Final  . MCHC 01/14/2018  32.5  30.0 - 36.0 g/dL Final  . RDW 01/14/2018 12.7  11.5 - 15.5 % Final  . Platelets 01/14/2018 276  150 - 400 K/uL Final   Performed at [facility], [place] [address], [place], [place] [address]  . Sodium 01/14/2018 139  135 - 145 mmol/L Final  . Potassium 01/14/2018 4.4  3.5 - 5.1 mmol/L Final  . Chloride 01/14/2018 101  98 - 111 mmol/L Final  . CO2 01/14/2018 30  22 - 32 mmol/L Final  . Glucose, Bld 01/14/2018 161* 70 - 99 mg/dL Final  . BUN 01/14/2018 9  8 - 23 mg/dL Final  . Creatinine, Ser 01/14/2018 0.69  0.44 - 1.00 mg/dL Final  . Calcium 01/14/2018 8.8* 8.9 - 10.3 mg/dL Final  . GFR calc non Af Amer 01/14/2018 >60  >60 mL/min Final  . GFR calc Af Amer 01/14/2018 >60  >60 mL/min Final   Comment: (NOTE) The eGFR has been calculated using the CKD EPI equation. This calculation has not been validated in all clinical situations. eGFR's persistently <60 mL/min signify possible Chronic Kidney Disease.   Georgiann Hahn gap 01/14/2018 8  5 - 15 Final   Performed at [facility], [place] [address], [place], [place] [address]  . WBC 01/15/2018 9.6  4.0 - 10.5 K/uL Final  . RBC 01/15/2018 3.97  3.87 - 5.11 MIL/uL Final  . Hemoglobin 01/15/2018 12.0  12.0 - 15.0 g/dL Final  . HCT 01/15/2018 37.3  36.0 - 46.0 % Final  . MCV 01/15/2018 94.0  78.0 - 100.0 fL Final  . MCH 01/15/2018 30.2  26.0 - 34.0 pg Final  . MCHC 01/15/2018 32.2  30.0 - 36.0 g/dL Final  . RDW 01/15/2018 12.9  11.5 - 15.5 % Final  . Platelets 01/15/2018 239  150 - 400 K/uL Final   Performed at [facility], [place] [address] [place], [place] [address]  . Sodium 01/15/2018 138  135 - 145 mmol/L Final  . Potassium 01/15/2018 3.7  3.5 - 5.1 mmol/L Final  . Chloride 01/15/2018 103  98 - 111 mmol/L Final  . CO2 01/15/2018 29  22 - 32 mmol/L Final  . Glucose, Bld 01/15/2018 109* 70 - 99 mg/dL Final  . BUN 01/15/2018 9  8 - 23 mg/dL Final  . Creatinine, Ser 01/15/2018 0.66  0.44 -  1.00 mg/dL Final  . Calcium 01/15/2018 8.6* 8.9 - 10.3 mg/dL Final  . GFR calc non Af Amer 01/15/2018 >60  >60 mL/min Final  . GFR calc Af Amer 01/15/2018 >60  >60 mL/min Final   Comment: (NOTE) The eGFR has been calculated using the CKD EPI equation. This calculation has not been validated in all clinical situations. eGFR's persistently <60 mL/min signify possible Chronic Kidney Disease.   Georgiann Hahn gap 01/15/2018 6  5 - 15 Final   Performed at [facility], [place] [address] [place], [place] [address]  . WBC 01/16/2018 7.8  4.0 - 10.5 K/uL Final  . RBC 01/16/2018 3.85* 3.87 - 5.11 MIL/uL Final  . Hemoglobin 01/16/2018 11.6* 12.0 - 15.0 g/dL Final  . HCT 01/16/2018 36.6  36.0 - 46.0 % Final  . MCV 01/16/2018 95.1  78.0 - 100.0 fL Final  . MCH 01/16/2018 30.1  26.0 - 34.0 pg Final  . MCHC 01/16/2018 31.7  30.0 - 36.0 g/dL Final  . RDW 01/16/2018 13.2  11.5 - 15.5 % Final  . Platelets 01/16/2018 240  150 - 400 K/uL Final   Performed at [facility], [place] [address], [place], [place] [address]  Hospital Outpatient Visit on 01/04/2018  Component Date Value Ref Range Status  . aPTT 01/04/2018 30  24 - 36 seconds Final   Performed at [facility], [place] [address], [place], [place] [address]  . WBC 01/04/2018 5.5  4.0 - 10.5 K/uL Final  . RBC 01/04/2018 4.75  3.87 - 5.11 MIL/uL Final  . Hemoglobin 01/04/2018 14.5  12.0 - 15.0 g/dL Final  . HCT 01/04/2018 44.1  36.0 - 46.0 % Final  . MCV 01/04/2018 92.8  78.0 - 100.0 fL Final  . MCH 01/04/2018 30.5  26.0 - 34.0 pg Final  . MCHC 01/04/2018 32.9  30.0 - 36.0 g/dL Final  . RDW 01/04/2018 12.6  11.5 - 15.5 % Final  . Platelets 01/04/2018 272  150 - 400 K/uL Final   Performed at [facility], [place] [address], [place], [place] [address]  . Sodium 01/04/2018 138  135 - 145 mmol/L Final  . Potassium 01/04/2018 4.4  3.5 - 5.1 mmol/L Final  . Chloride 01/04/2018 100  98 - 111  mmol/L Final   Please note change in reference range.  . CO2 01/04/2018 29  22 - 32 mmol/L Final  . Glucose, Bld 01/04/2018 87  70 - 99 mg/dL Final   Please note change in reference range.  . BUN 01/04/2018 20  8 - 23 mg/dL Final  Please note change in reference range.  . Creatinine, Ser 01/04/2018 0.73  0.44 - 1.00 mg/dL Final  . Calcium 01/04/2018 9.3  8.9 - 10.3 mg/dL Final  . Total Protein 01/04/2018 7.4  6.5 - 8.1 g/dL Final  . Albumin 01/04/2018 4.2  3.5 - 5.0 g/dL Final  . AST 01/04/2018 20  15 - 41 U/L Final  . ALT 01/04/2018 21  0 - 44 U/L Final   Please note change in reference range.  . Alkaline Phosphatase 01/04/2018 63  38 - 126 U/L Final  . Total Bilirubin 01/04/2018 0.7  0.3 - 1.2 mg/dL Final  . GFR calc non Af Amer 01/04/2018 >60  >60 mL/min Final  . GFR calc Af Amer 01/04/2018 >60  >60 mL/min Final   Comment: (NOTE) The eGFR has been calculated using the CKD EPI equation. This calculation has not been validated in all clinical situations. eGFR's persistently <60 mL/min signify possible Chronic Kidney Disease.   Georgiann Hahn gap 01/04/2018 9  5 - 15 Final   Performed at [facility], [place] [address] [place], [place] [address]  . Prothrombin Time 01/04/2018 11.9  11.4 - 15.2 seconds Final  . INR 01/04/2018 0.89   Final   Performed at [facility], [place] [address] [place], [place] [address]  . ABO/RH(D) 01/04/2018 O NEG   Final  . Antibody Screen 01/04/2018 NEG   Final  . Sample Expiration 01/04/2018 01/16/2018   Final  . Extend sample reason 01/04/2018    Final                   Value:NO TRANSFUSIONS OR PREGNANCY IN THE PAST 3 MONTHS Performed at [facility], [place] [address] [place], [place] [address]   . Color, Urine 01/04/2018 YELLOW  YELLOW Final  . APPearance 01/04/2018 CLEAR  CLEAR Final  . Specific Gravity, Urine 01/04/2018 1.009  1.005 - 1.030 Final  . pH 01/04/2018 5.0  5.0 - 8.0 Final  . Glucose, UA  01/04/2018 NEGATIVE  NEGATIVE mg/dL Final  . Hgb urine dipstick 01/04/2018 NEGATIVE  NEGATIVE Final  . Bilirubin Urine 01/04/2018 NEGATIVE  NEGATIVE Final  . Ketones, ur 01/04/2018 NEGATIVE  NEGATIVE mg/dL Final  . Protein, ur 01/04/2018 NEGATIVE  NEGATIVE mg/dL Final  . Nitrite 01/04/2018 NEGATIVE  NEGATIVE Final  . Leukocytes, UA 01/04/2018 NEGATIVE  NEGATIVE Final   Performed at [place] [address] [place], [place] [address]  . MRSA, PCR 01/04/2018 NEGATIVE  NEGATIVE Final  . Staphylococcus aureus 01/04/2018 NEGATIVE  NEGATIVE Final   Comment: (NOTE) The Xpert SA Assay (FDA approved for NASAL specimens in patients 78 years of age and older), is one component of a comprehensive surveillance program. It is not intended to diagnose infection nor to guide or monitor treatment. Performed at [facility], [place] [address], [place], [place] [address]      X-Rays:No results found.  EKG:No orders found for this or any previous visit.   Hospital Course: NESIAH JUMP is a 62 y.o. who was admitted to [facility]. They were brought to the operating room on 01/13/2018 and underwent Procedure(s): Revision of Left knee unicompartmental arthroplasty to total knee arthroplasty.  Patient tolerated the procedure well and was later transferred to the recovery room and then to the orthopaedic floor for postoperative care.  They were given PO and IV analgesics for pain control following their surgery.  They were given 24 hours of  postoperative antibiotics of  Anti-infectives (From admission, onward)   Start     Dose/Rate Route Frequency Ordered Stop   01/14/18 0600  vancomycin (VANCOCIN) IVPB 1000 mg/200 mL premix     1,000 mg 200 mL/hr over 60 Minutes Intravenous On call to O.R. 01/13/18 1218 01/13/18 1400   01/14/18 0200  vancomycin (VANCOCIN) IVPB 1000 mg/200 mL premix     1,000 mg 200 mL/hr over 60 Minutes Intravenous Every 12 hours 01/13/18  1747 01/14/18 0322     and started on DVT prophylaxis in the form of Xarelto.  PT and OT were ordered for total joint protocol.  Discharge planning consulted to help with postop disposition and equipment needs.  Patient had a decent night on the evening of surgery. She started to get up OOB with therapy on POD #1. Hemovac drain was pulled without difficulty. Continued to work with therapy into POD #2. Dressing was changed and the incision was clean, dry, and intact with no drainage. Pt was having difficulties getting pain in the left knee under control, oxycodone was switched to dilaudid tablets. Pt was still complaining of difficulty managing pain, so muscle relaxer was switched to valium. She was seen on POD #3 by Jenetta Loges, PA-C, stated that pain control was much improved upon change of medications and she was ready to go home. Incision was healing well. She completed one additional session of therapy that morning and was discharged later to home that day in stable condition.   Diet: Regular diet Activity:WBAT Follow-up:in 2 weeks with Dr. Wynelle Link Disposition - Home with outpatient PT at [facility] Discharged Condition: stable   Discharge Instructions    Call MD / Call 911   Complete by:  As directed    If you experience chest pain or shortness of breath, CALL 911 and be transported to the hospital emergency room.  If you develope a fever above 101 F, pus (white drainage) or increased drainage or redness at the wound, or calf pain, call your surgeon's office.   Change dressing   Complete by:  As directed    Change the dressing daily with sterile 4 x 4 inch gauze dressing and apply TED hose.   Constipation Prevention   Complete by:  As directed    Drink plenty of fluids.  Prune juice may be helpful.  You may use a stool softener, such as Colace (over the counter) 100 mg twice a day.  Use MiraLax (over the counter) for constipation as needed.   Diet - low sodium heart healthy    Complete by:  As directed    Discharge instructions   Complete by:  As directed    Dr. [name] Total Joint Specialist [facility] [address] [place], [place] [address] [phone]  TOTAL KNEE REPLACEMENT POSTOPERATIVE DIRECTIONS  Knee Rehabilitation, Guidelines Following Surgery  Results after knee surgery are often greatly improved when you follow the exercise, range of motion and muscle strengthening exercises prescribed by your doctor. Safety measures are also important to protect the knee from further injury. Any time any of these exercises cause you to have increased pain or swelling in your knee joint, decrease the amount until you are comfortable again and slowly increase them. If you have problems or questions, call your caregiver or physical therapist for advice.   HOME CARE INSTRUCTIONS  Remove items at home which could result in a fall. This includes throw rugs or furniture in walking pathways.  ICE  to the affected knee every three hours for 30 minutes at a time and then as needed for pain and swelling.  Continue to use ice on the knee for pain and swelling from surgery. You may notice swelling that will progress down to the foot and ankle.  This is normal after surgery.  Elevate the leg when you are not up walking on it.   Continue to use the breathing machine which will help keep your temperature down.  It is common for your temperature to cycle up and down following surgery, especially at night when you are not up moving around and exerting yourself.  The breathing machine keeps your lungs expanded and your temperature down. Do not place pillow under knee, focus on keeping the knee straight while resting   DIET You may resume your previous home diet once your are discharged from the hospital.  DRESSING / WOUND CARE / SHOWERING You may shower 3 days after surgery, but keep the wounds dry during showering.  You may use an occlusive plastic wrap (Press'n  Seal for example), NO SOAKING/SUBMERGING IN THE BATHTUB.  If the bandage gets wet, change with a clean dry gauze.  If the incision gets wet, pat the wound dry with a clean towel. You may start showering once you are discharged home but do not submerge the incision under water. Just pat the incision dry and apply a dry gauze dressing on daily. Change the surgical dressing daily and reapply a dry dressing each time.  ACTIVITY Walk with your walker as instructed. Use walker as long as suggested by your caregivers. Avoid periods of inactivity such as sitting longer than an hour when not asleep. This helps prevent blood clots.  You may resume a sexual relationship in one month or when given the OK by your doctor.  You may return to work once you are cleared by your doctor.  Do not drive a car for 6 weeks or until released by you surgeon.  Do not drive while taking narcotics.  WEIGHT BEARING Weight bearing as tolerated with assist device (walker, cane, etc) as directed, use it as long as suggested by your surgeon or therapist, typically at least 4-6 weeks.  POSTOPERATIVE CONSTIPATION PROTOCOL Constipation - defined medically as fewer than three stools per week and severe constipation as less than one stool per week.  One of the most common issues patients have following surgery is constipation.  Even if you have a regular bowel pattern at home, your normal regimen is likely to be disrupted due to multiple reasons following surgery.  Combination of anesthesia, postoperative narcotics, change in appetite and fluid intake all can affect your bowels.  In order to avoid complications following surgery, here are some recommendations in order to help you during your recovery period.  Colace (docusate) - Pick up an over-the-counter form of Colace or another stool softener and take twice a day as long as you are requiring postoperative pain medications.  Take with a full glass of water daily.  If you  experience loose stools or diarrhea, hold the colace until you stool forms back up.  If your symptoms do not get better within 1 week or if they get worse, check with your doctor.  Dulcolax (bisacodyl) - Pick up over-the-counter and take as directed by the product packaging as needed to assist with the movement of your bowels.  Take with a full glass of water.  Use this product as needed if not relieved by Colace only.  MiraLax (polyethylene glycol) - Pick up over-the-counter to have on hand.  MiraLax is a solution that will increase the amount of water in your bowels to assist with bowel movements.  Take as directed and can mix with a glass of water, juice, soda, coffee, or tea.  Take if you go more than two days without a movement. Do not use MiraLax more than once per day. Call your doctor if you are still constipated or irregular after using this medication for 7 days in a row.  If you continue to have problems with postoperative constipation, please contact the office for further assistance and recommendations.  If you experience "the worst abdominal pain ever" or develop nausea or vomiting, please contact the office immediatly for further recommendations for treatment.  ITCHING  If you experience itching with your medications, try taking only a single pain pill, or even half a pain pill at a time.  You can also use Benadryl over the counter for itching or also to help with sleep.   TED HOSE STOCKINGS Wear the elastic stockings on both legs for three weeks following surgery during the day but you may remove then at night for sleeping.  MEDICATIONS See your medication summary on the "After Visit Summary" that the nursing staff will review with you prior to discharge.  You may have some home medications which will be placed on hold until you complete the course of blood thinner medication.  It is important for you to complete the blood thinner medication as prescribed by your surgeon.  Continue  your approved medications as instructed at time of discharge.  PRECAUTIONS If you experience chest pain or shortness of breath - call 911 immediately for transfer to the hospital emergency department.  If you develop a fever greater that 101 F, purulent drainage from wound, increased redness or drainage from wound, foul odor from the wound/dressing, or calf pain - CONTACT YOUR SURGEON.                                                   FOLLOW-UP APPOINTMENTS Make sure you keep all of your appointments after your operation with your surgeon and caregivers. You should call the office at the above phone number and make an appointment for approximately two weeks after the date of your surgery or on the date instructed by your surgeon outlined in the "After Visit Summary".   RANGE OF MOTION AND STRENGTHENING EXERCISES  Rehabilitation of the knee is important following a knee injury or an operation. After just a few days of immobilization, the muscles of the thigh which control the knee become weakened and shrink (atrophy). Knee exercises are designed to build up the tone and strength of the thigh muscles and to improve knee motion. Often times heat used for twenty to thirty minutes before working out will loosen up your tissues and help with improving the range of motion but do not use heat for the first two weeks following surgery. These exercises can be done on a training (exercise) mat, on the floor, on a table or on a bed. Use what ever works the best and is most comfortable for you Knee exercises include:  Leg Lifts - While your knee is still immobilized in a splint or cast, you can do straight leg raises. Lift the leg to 60 degrees, hold  for 3 sec, and slowly lower the leg. Repeat 10-20 times 2-3 times daily. Perform this exercise against resistance later as your knee gets better.  Quad and Hamstring Sets - Tighten up the muscle on the front of the thigh (Quad) and hold for 5-10 sec. Repeat this 10-20  times hourly. Hamstring sets are done by pushing the foot backward against an object and holding for 5-10 sec. Repeat as with quad sets.  Leg Slides: Lying on your back, slowly slide your foot toward your buttocks, bending your knee up off the floor (only go as far as is comfortable). Then slowly slide your foot back down until your leg is flat on the floor again. Angel Wings: Lying on your back spread your legs to the side as far apart as you can without causing discomfort.  A rehabilitation program following serious knee injuries can speed recovery and prevent re-injury in the future due to weakened muscles. Contact your doctor or a physical therapist for more information on knee rehabilitation.   IF YOU ARE TRANSFERRED TO A SKILLED REHAB FACILITY If the patient is transferred to a skilled rehab facility following release from the hospital, a list of the current medications will be sent to the facility for the patient to continue.  When discharged from the skilled rehab facility, please have the facility set up the patient's [place] prior to being released. Also, the skilled facility will be responsible for providing the patient with their medications at time of release from the facility to include their pain medication, the muscle relaxants, and their blood thinner medication. If the patient is still at the rehab facility at time of the two week follow up appointment, the skilled rehab facility will also need to assist the patient in arranging follow up appointment in our office and any transportation needs.  MAKE SURE YOU:  Understand these instructions.  Get help right away if you are not doing well or get worse.    Pick up stool softner and laxative for home use following surgery while on pain medications. Do not submerge incision under water. Please use good hand washing techniques while changing dressing each day. May shower starting three days after surgery. Please use  a clean towel to pat the incision dry following showers. Continue to use ice for pain and swelling after surgery. Do not use any lotions or creams on the incision until instructed by your surgeon.   Do not put a pillow under the knee. Place it under the heel.   Complete by:  As directed    Driving restrictions   Complete by:  As directed    No driving for two weeks   TED hose   Complete by:  As directed    Use stockings (TED hose) for three weeks on both leg(s).  You may remove them at night for sleeping.   Weight bearing as tolerated   Complete by:  As directed      Allergies as of 01/15/2018      Reactions   Penicillins Rash   As a child. Has patient had a PCN reaction causing immediate rash, facial/tongue/throat swelling, SOB or lightheadedness with hypotension: Unknown Has patient had a PCN reaction causing severe rash involving mucus membranes or skin necrosis: Unknown Has patient had a PCN reaction that required hospitalization: Unknown Has patient had a PCN reaction occurring within the last 10 years: Unknown If all of the above answers are "NO", then may proceed with Cephalosporin use.  Medication List    STOP taking these medications   estradiol 0.05 MG/24HR patch Commonly known as:  VIVELLE-DOT     TAKE these medications   acetaminophen 500 MG tablet Commonly known as:  TYLENOL Take 1,000 mg by mouth every 6 (six) hours as needed.   atorvastatin 20 MG tablet Commonly known as:  LIPITOR Take 20 mg by mouth at bedtime.   citalopram 20 MG tablet Commonly known as:  CELEXA Take 20 mg by mouth at bedtime.   fluticasone 50 MCG/ACT nasal spray Commonly known as:  FLONASE Place 1 spray into both nostrils daily as needed for allergies or rhinitis.   gabapentin 300 MG capsule Commonly known as:  NEURONTIN Take 1 capsule (300 mg total) by mouth 3 (three) times daily. Gabapentin 300 mg Protocol Take a 300 mg capsule three times a day for two weeks following  surgery. Then take a 300 mg capsule two times a day for two weeks.  Then take a 300 mg capsule once a day for two weeks.  Then discontinue the Gabapentin.   HYDROmorphone 2 MG tablet Commonly known as:  DILAUDID Take 1-2 tablets (2-4 mg total) by mouth every 6 (six) hours as needed for moderate pain or severe pain (pain score from 4-10).   methocarbamol 500 MG tablet Commonly known as:  ROBAXIN Take 1 tablet (500 mg total) by mouth every 6 (six) hours as needed for muscle spasms.   omeprazole 20 MG capsule Commonly known as:  PRILOSEC Take 20 mg by mouth daily.   ondansetron 4 MG tablet Commonly known as:  ZOFRAN Take 1 tablet (4 mg total) by mouth every 6 (six) hours as needed for nausea.   rivaroxaban 10 MG Tabs tablet Commonly known as:  XARELTO Take 1 tablet (10 mg total) by mouth daily for 19 days. Take one 10 mg tablet of xarelto once a day for three weeks following surgery.  Then take one baby aspirin (81 mg) once a day for three weeks.  Then discontinue aspirin.            Discharge Care Instructions  (From admission, onward)        Start     Ordered   01/15/18 0000  Weight bearing as tolerated     01/15/18 0748   01/15/18 0000  Change dressing    Comments:  Change the dressing daily with sterile 4 x 4 inch gauze dressing and apply TED hose.   01/15/18 0748     Follow-up Information    [name], MD. Schedule an appointment as soon as possible for a visit on 01/26/2018.   Specialty:  Orthopedic Surgery Contact information: [address] [place] [address] [phone]           Signed: Theresa Duty, PA-C Orthopedic Surgery 01/19/2018, 12:53 PM

## 2018-01-19 NOTE — Therapy (Signed)
[facility] [address]  [place], [place], [address] Phone: [phone]   Fax:  [phone]  Physical Therapy Treatment  Patient Details  Name: Joanna Willis MRN: [id number] Date of Birth: [birth year] Referring Provider: Theresa Duty, PA-C   Encounter Date: 01/19/2018  PT End of Session - 01/19/18 0938    Visit Number  2    Number of Visits  18    Date for PT Re-Evaluation  03/01/18    PT Start Time  0933    PT Stop Time  1093    PT Time Calculation (min)  50 min    Activity Tolerance  Patient tolerated treatment well;Patient limited by pain;Other (comment) Limited tolerance for static positions    Behavior During Therapy  [facility] for tasks assessed/performed       Past Medical History:  Diagnosis Date  . Arthritis   . Depression   . GERD (gastroesophageal reflux disease)   . Hypercholesteremia   . PONV (postoperative nausea and vomiting)   . Postmenopausal   . Wears glasses     Past Surgical History:  Procedure Laterality Date  . ABDOMINAL HYSTERECTOMY  2005   did not remove ovaries  . CESAREAN SECTION  2000  . COLONOSCOPY    . CONVERSION TO TOTAL KNEE Left 01/13/2018   Procedure: Revision of Left knee unicompartmental arthroplasty to total knee arthroplasty;  Surgeon: [name], MD;  Location: [facility];  Service: Orthopedics;  Laterality: Left;  . FOOT ARTHRODESIS  1995   right  . KNEE ARTHROSCOPY WITH MEDIAL MENISECTOMY Left 05/17/2014   Procedure: LEFT ARTHROSCOPY KNEE WITH PARTIAL MEDIAL MENISECTOMY, CHONDROPLASTY OF LATERAL FEMORAL CONDYLE;  Surgeon: Alta Corning, MD;  Location: [place];  Service: Orthopedics;  Laterality: Left;  . PARTIAL KNEE ARTHROPLASTY Left 01/21/2017   Procedure: UNICOMPARTMENTAL LEFT KNEE;  Surgeon: [name], MD;  Location: [facility];  Service: Orthopedics;  Laterality: Left;  . Revision left knee     Dr. Wynelle Link 01-13-18    There were no vitals  filed for this visit.  Subjective Assessment - 01/19/18 0936    Subjective  Pt. reporting broken sleep last night due to L knee pain.      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  Yes    Pain Score  4     Pain Location  Knee    Pain Orientation  Left    Pain Descriptors / Indicators  Aching;Sore;Tender    Pain Type  Surgical pain    Pain Onset  In the past 7 days    Pain Frequency  Constant    Aggravating Factors   knee flexion, sitting     Pain Relieving Factors  Medication, ice     Multiple Pain Sites  No                       [facility] Adult PT Treatment/Exercise - 01/19/18 0949      Knee/Hip Exercises: Stretches   Passive Hamstring Stretch  Left;1 rep;30 seconds    Passive Hamstring Stretch Limitations  with strap     Gastroc Stretch  Left;30 seconds    Gastroc Stretch Limitations  strap assistance       Knee/Hip Exercises: Aerobic   Nustep  Lvl 3, 6 min       Knee/Hip Exercises: Seated   Heel  Slides  Left;10 reps;AAROM    Heel Slides Limitations  opposite LE assistance     Other Seated Knee/Hip Exercises  Heel Raises; 15 reps     Other Seated Knee/Hip Exercises  Toe Raises; 15 reps    Abduction/Adduction   Left;10 reps    Abd/Adduction Limitations  L "windshield wipers"       Knee/Hip Exercises: Supine   Quad Sets  Left;10 reps    Quad Sets Limitations  10" hold     Short Arc Quad Sets  Left;10 reps    Short Arc Quad Sets Limitations  bolster under knee     Heel Slides  Left;10 reps;AAROM    Heel Slides Limitations  with strap assistance     Other Supine Knee/Hip Exercises  manually resisted L adduction 5" x 10 reps     Other Supine Knee/Hip Exercises  Elevated ankle pumps x 20 reps with LE on peanut p-ball       Modalities   Modalities  Vasopneumatic      Vasopneumatic   Number Minutes Vasopneumatic   10 minutes    Vasopnuematic Location   Knee    Vasopneumatic Pressure  Low    Vasopneumatic  Temperature   lowest               PT Short Term Goals - 01/19/18 0939      PT SHORT TERM GOAL #1   Title  Pt will be independent with initial HEP    Status  On-going      PT SHORT TERM GOAL #2   Title  Pt will have 90 degrees of L knee AROM flexion    Status  On-going        PT Long Term Goals - 01/19/18 0939      PT LONG TERM GOAL #1   Title  Pt to be independent with advanced HEP    Status  On-going      PT LONG TERM GOAL #2   Title  Patient to demonstrate 0-120 degrees of AROM at L knee to improve gait and abilty to perform ADLs independently    Status  On-going      PT LONG TERM GOAL #3   Title  Patient to report ability to take care of animals and garden independently with no increased pain.     Status  On-going      PT LONG TERM GOAL #4   Title  Pt to report pain at worst 4/10 with activity     Status  On-going      PT LONG TERM GOAL #5   Title  Pt will demonstrate normal gait pattern with LRAD in order to improve functional mobility and decrease fall risk    Status  On-going      PT LONG TERM GOAL #6   Title  Pt will have LE strength globally of 4+/5 to improve functional mobility and perform ADLs and job-related activities safely    Status  On-going            Plan - 01/19/18 0939    Clinical Impression Statement  Joanna Willis reporting somewhat decreased L knee pain this morning since taking pain meds before session.  Tolerated addition of quad set, add/abd "windshield wipers", heels slides, and SAQ well.  Ended session with ice/compression to L knee to decrease post-exercise soreness/swelling.  Will continue to progress toward goals.      PT Treatment/Interventions  ADLs/Self Care Home Management;Cryotherapy;Electrical Stimulation;Iontophoresis 4mg /ml Dexamethasone;Moist Heat;Ultrasound;DME  Instruction;Gait training;Stair training;Functional mobility training;Therapeutic activities;Therapeutic exercise;Balance training;Neuromuscular  re-education;Patient/family education;Manual techniques;Scar mobilization;Passive range of motion;Dry needling;Taping;Vasopneumatic Device    PT Next Visit Plan  Assess pt LE strength as tolerated    Consulted and Agree with Plan of Care  Patient       Patient will benefit from skilled therapeutic intervention in order to improve the following deficits and impairments:  Abnormal gait, Decreased skin integrity, Hypomobility, Impaired sensation, Increased edema, Decreased activity tolerance, Decreased strength, Increased fascial restricitons, Pain, Decreased mobility, Difficulty walking, Increased muscle spasms, Decreased range of motion, Improper body mechanics, Impaired flexibility, Decreased balance  Visit Diagnosis: Acute pain of left knee  Difficulty in walking, not elsewhere classified  Other symptoms and signs involving the musculoskeletal system  Stiffness of left knee, not elsewhere classified  Muscle weakness (generalized)  Other abnormalities of gait and mobility     Problem List Patient Active Problem List   Diagnosis Date Noted  . S/P left unicompartmental knee replacement 01/13/2018  . OA (osteoarthritis) of knee 01/21/2017  . Left knee pain 04/19/2014  . BRONCHITIS, ACUTE 07/21/2010    Bess Harvest, PTA 01/19/18 10:30 AM   [facility] [address]  [place], [place], [address] Phone: [phone]   Fax:  [phone]  Name: Joanna Willis MRN: [id number] Date of Birth: 1955/08/13

## 2018-01-20 ENCOUNTER — Encounter: Payer: Self-pay | Admitting: *Deleted

## 2018-01-20 ENCOUNTER — Other Ambulatory Visit: Payer: Self-pay | Admitting: *Deleted

## 2018-01-20 MED FILL — XARELTO 10 MG TABLET: 10 | 14 days supply | Qty: 14 | Fill #0

## 2018-01-20 NOTE — Patient Outreach (Signed)
[facility]) Care Management  01/20/2018  URI COVEY 1955-07-11 [id number]   Subjective: Telephone call to patient's home number, spoke with patient, and HIPAA verified.  Discussed [facility] Care Management UMR Transition of care follow up, patient voiced understanding, and is in agreement to follow up.  Patient states she is doing okay, pain being managed with medications, started outpatient physical therapy, and therapy going well.   States she has a follow up appointment with surgeon on 01/26/18.  Patient states she is able to manage self care and has assistance as needed with activities of daily living / home management.  Patient voices understanding of medical diagnosis, surgery, and treatment plan. States she is accessing the following [facility] benefits: outpatient pharmacy, hospital indemnity (verbally given contact number for Teena Dunk [phone], will file claim if appropriate, verbally given contact number for [employer] [phone] to request itemized bill), and is not planning to access family medical leave act (FMLA) at this time due to past issues with paperwork completion by her providers.  Patient states she does not have any education material, transition of care, care coordination, disease management, disease monitoring, transportation, community resource, or pharmacy needs at this time. States she is very appreciative of the follow up and is in agreement to receive [place] [facility] Management information.      Objective: Per KPN (Knowledge Performance Now, point of care tool) and chart review, patient hospitalized 01/13/18 -01/16/18 for Failed unicompartmental arthroplasty, left knee, OA (osteoarthritis) of knee, and status post Conversion of left knee unicompartmental replacement to total knee arthroplasty on 01/13/18.  Patient also has a history of Hypercholesteremia.       Assessment: Received UMR Preoperative / Transition of care referral on 01/06/18.   Transition of care follow up completed, no care management needs, and will proceed with case closure.      Plan: RNCM will send patient successful outreach letter, [facility] pamphlet, and magnet. RNCM will complete case closure due to follow up completed / no care management needs.        Julieanna Geraci H. Annia Friendly, BSN, [place] Management [facility] Telephonic CM Phone: [phone] Fax: [phone]

## 2018-01-21 ENCOUNTER — Encounter: Payer: Self-pay | Admitting: Physical Therapy

## 2018-01-21 ENCOUNTER — Ambulatory Visit: Payer: 59 | Attending: Student | Admitting: Physical Therapy

## 2018-01-21 DIAGNOSIS — R29898 Other symptoms and signs involving the musculoskeletal system: Secondary | ICD-10-CM | POA: Diagnosis not present

## 2018-01-21 DIAGNOSIS — M6281 Muscle weakness (generalized): Secondary | ICD-10-CM | POA: Insufficient documentation

## 2018-01-21 DIAGNOSIS — M25562 Pain in left knee: Secondary | ICD-10-CM | POA: Diagnosis not present

## 2018-01-21 DIAGNOSIS — R2689 Other abnormalities of gait and mobility: Secondary | ICD-10-CM | POA: Insufficient documentation

## 2018-01-21 DIAGNOSIS — R262 Difficulty in walking, not elsewhere classified: Secondary | ICD-10-CM | POA: Insufficient documentation

## 2018-01-21 DIAGNOSIS — M25662 Stiffness of left knee, not elsewhere classified: Secondary | ICD-10-CM | POA: Diagnosis not present

## 2018-01-21 DIAGNOSIS — K219 Gastro-esophageal reflux disease without esophagitis: Secondary | ICD-10-CM | POA: Diagnosis not present

## 2018-01-21 NOTE — Therapy (Signed)
[facility] [address]  [place], [place], [address] Phone: [phone]   Fax:  [phone]  Physical Therapy Treatment  Patient Details  Name: Joanna Willis MRN: [id number] Date of Birth: 24-May-1956 Referring Provider: Theresa Duty, PA-C   Encounter Date: 01/21/2018  PT End of Session - 01/21/18 1821    Visit Number  3    Number of Visits  18    Date for PT Re-Evaluation  03/01/18    PT Start Time  7564    PT Stop Time  1623    PT Time Calculation (min)  53 min    Equipment Utilized During Treatment  Gait belt    Activity Tolerance  Patient limited by pain    Behavior During Therapy  [facility] for tasks assessed/performed;Agitated;Restless Due to pain        Past Medical History:  Diagnosis Date  . Arthritis   . Depression   . GERD (gastroesophageal reflux disease)   . Hypercholesteremia   . PONV (postoperative nausea and vomiting)   . Postmenopausal   . Wears glasses     Past Surgical History:  Procedure Laterality Date  . ABDOMINAL HYSTERECTOMY  2005   did not remove ovaries  . CESAREAN SECTION  2000  . COLONOSCOPY    . CONVERSION TO TOTAL KNEE Left 01/13/2018   Procedure: Revision of Left knee unicompartmental arthroplasty to total knee arthroplasty;  Surgeon: [name], MD;  Location[facility];  Service: Orthopedics;  Laterality: Left;  . FOOT ARTHRODESIS  1995   right  . KNEE ARTHROSCOPY WITH MEDIAL MENISECTOMY Left 05/17/2014   Procedure: LEFT ARTHROSCOPY KNEE WITH PARTIAL MEDIAL MENISECTOMY, CHONDROPLASTY OF LATERAL FEMORAL CONDYLE;  Surgeon: Alta Corning, MD;  Location: [place];  Service: Orthopedics;  Laterality: Left;  . PARTIAL KNEE ARTHROPLASTY Left 01/21/2017   Procedure: UNICOMPARTMENTAL LEFT KNEE;  Surgeon: [name], MD;  Location[facility];  Service: Orthopedics;  Laterality: Left;  . Revision left knee     Dr. Wynelle Link 01-13-18    There were no vitals filed for  this visit.  Subjective Assessment - 01/21/18 1552    Subjective  Pt reports that she is in a lot of pain today and slept about an hour over last night. Pt states she is having a lot of difficulty with sitting and states the back of the knee is very painful.     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  Yes    Pain Score  8  10 when sitting in the waiting room    Pain Location  Throat    Pain Orientation  Left    Pain Descriptors / Indicators  Aching;Tender;Sore;Spasm    Pain Type  Surgical pain    Pain Radiating Towards  Pes Anserine area    Aggravating Factors   Sitting with the knee in flexion, statis postures    Pain Relieving Factors  Medication, changing positions                       [facility] Adult PT Treatment/Exercise - 01/21/18 0001      Knee/Hip Exercises: Supine   Quad Sets  Left;10 reps    Quad Sets Limitations  5 sec hold; Resting back of the knee on blue bolster    Heel Slides  Left;Limitations    Heel Slides Limitations  8 reps; with strap assistance and pt holding stretch for duration of tolerance      Modalities   Modalities  Electrical Stimulation;Vasopneumatic      Electrical Stimulation   Electrical Stimulation Location  Medial and lateral distal hamstrings and proximal hamstrings    Electrical Stimulation Action  IFC    Electrical Stimulation Parameters  80-150 Hz; duration of performance of therapeutic intervention    Electrical Stimulation Goals  Pain      Vasopneumatic   Number Minutes Vasopneumatic   15 minutes    Vasopnuematic Location   Knee    Vasopneumatic Pressure  Low    Vasopneumatic Temperature   Lowest      Manual Therapy   Manual Therapy  Myofascial release;Taping    Manual therapy comments  In seated addressing bilateral hamstring musculature    Myofascial Release  TPR performed to medial and lateral  hamstring musculature     Kinesiotex  Inhibit Muscle;Create Space      Kinesiotix   Create Space  Two strips; one one medial hamstring and one on lateral hamstring musculature.Create space between skin and hamstring musculature to allow for increased blood flow; 50% stretch     Inhibit Muscle   Prevent muscle twitching and involuntary guarding of hamstring muscules that were conributing to pt's pain levels; 50% stretch              PT Education - 01/21/18 1833    Education Details  Reviewed exercises given to pt by surgeon and explained differences between PT HEP exercises and already given exercises    Person(s) Educated  Patient    Methods  Explanation    Comprehension  Verbalized understanding       PT Short Term Goals - 01/19/18 0939      PT SHORT TERM GOAL #1   Title  Pt will be independent with initial HEP    Status  On-going      PT SHORT TERM GOAL #2   Title  Pt will have 90 degrees of L knee AROM flexion    Status  On-going        PT Long Term Goals - 01/19/18 0939      PT LONG TERM GOAL #1   Title  Pt to be independent with advanced HEP    Status  On-going      PT LONG TERM GOAL #2   Title  Patient to demonstrate 0-120 degrees of AROM at L knee to improve gait and abilty to perform ADLs independently    Status  On-going      PT LONG TERM GOAL #3   Title  Patient to report ability to take care of animals and garden independently with no increased pain.     Status  On-going      PT LONG TERM GOAL #4   Title  Pt to report pain at worst 4/10 with activity     Status  On-going      PT LONG TERM GOAL #5   Title  Pt will demonstrate normal gait pattern with LRAD in order to improve functional mobility and decrease fall risk    Status  On-going      PT LONG TERM GOAL #6   Title  Pt will have LE strength globally of 4+/5 to improve functional mobility and perform ADLs and job-related activities safely    Status  On-going  Plan - 01/21/18  1821    Clinical Impression Statement  Joanna Willis was in a lot of pain when she arrived for her therapy treatment session today, therefore IFC electrical stimulation was applied during duration of therapeutic intervention for pain modulation. Throughout the session, by way of visual observation, she was able to obtain a greater degree of knee flexion with repetition of gentle stretching and calming of hamstring muscle cramping by way of reciprocal inhibition through quadriceps activation. She will continue to benefit from physical therapy to address her pain levels, improve ROM, improve her strength, and improve functional mobility as well as progress toward functional goals.     Rehab Potential  Good    PT Treatment/Interventions  ADLs/Self Care Home Management;Cryotherapy;Electrical Stimulation;Iontophoresis 4mg /ml Dexamethasone;Moist Heat;Ultrasound;DME Instruction;Gait training;Stair training;Functional mobility training;Therapeutic activities;Therapeutic exercise;Balance training;Neuromuscular re-education;Patient/family education;Manual techniques;Scar mobilization;Passive range of motion;Dry needling;Taping;Vasopneumatic Device    PT Next Visit Plan  Assess pt LE strength as tolerated    Consulted and Agree with Plan of Care  Patient       Patient will benefit from skilled therapeutic intervention in order to improve the following deficits and impairments:  Abnormal gait, Decreased skin integrity, Hypomobility, Impaired sensation, Increased edema, Decreased activity tolerance, Decreased strength, Increased fascial restricitons, Pain, Decreased mobility, Difficulty walking, Increased muscle spasms, Decreased range of motion, Improper body mechanics, Impaired flexibility, Decreased balance  Visit Diagnosis: Acute pain of left knee  Difficulty in walking, not elsewhere classified  Other symptoms and signs involving the musculoskeletal system  Stiffness of left knee, not elsewhere  classified     Problem List Patient Active Problem List   Diagnosis Date Noted  . S/P left unicompartmental knee replacement 01/13/2018  . OA (osteoarthritis) of knee 01/21/2017  . Left knee pain 04/19/2014  . BRONCHITIS, ACUTE 07/21/2010    Shirline Frees, SPT 01/21/2018, 6:56 PM  [facility] [address]  [place], [place], [address] Phone: [phone]   Fax:  [phone]  Name: Joanna Willis MRN: [id number] Date of Birth: June 10, 1956

## 2018-01-22 MED FILL — diazePAM 5 MG TABS: 5 | 10 days supply | Qty: 40 | Fill #0

## 2018-01-25 ENCOUNTER — Encounter: Payer: Self-pay | Admitting: Physical Therapy

## 2018-01-25 ENCOUNTER — Ambulatory Visit: Payer: 59 | Admitting: Physical Therapy

## 2018-01-25 DIAGNOSIS — R262 Difficulty in walking, not elsewhere classified: Secondary | ICD-10-CM

## 2018-01-25 DIAGNOSIS — M25562 Pain in left knee: Secondary | ICD-10-CM | POA: Diagnosis not present

## 2018-01-25 DIAGNOSIS — R2689 Other abnormalities of gait and mobility: Secondary | ICD-10-CM | POA: Diagnosis not present

## 2018-01-25 DIAGNOSIS — M6281 Muscle weakness (generalized): Secondary | ICD-10-CM

## 2018-01-25 DIAGNOSIS — M25662 Stiffness of left knee, not elsewhere classified: Secondary | ICD-10-CM

## 2018-01-25 DIAGNOSIS — K219 Gastro-esophageal reflux disease without esophagitis: Secondary | ICD-10-CM | POA: Diagnosis not present

## 2018-01-25 DIAGNOSIS — R29898 Other symptoms and signs involving the musculoskeletal system: Secondary | ICD-10-CM | POA: Diagnosis not present

## 2018-01-25 MED FILL — OMEPRAZOLE 20 MG CPDR: 20 | 90 days supply | Qty: 90 | Fill #0

## 2018-01-25 NOTE — Therapy (Signed)
[facility] [address]  [place], [place], [address] Phone: [phone]   Fax:  [phone]  Physical Therapy Treatment  Patient Details  Name: Joanna Willis MRN: [id number] Date of Birth: 21-Aug-1955 Referring Provider: Theresa Duty, PA-C   Encounter Date: 01/25/2018  PT End of Session - 01/25/18 1238    Visit Number  4    Number of Visits  18    Date for PT Re-Evaluation  03/01/18    PT Start Time  1018    PT Stop Time  1113    PT Time Calculation (min)  55 min    Activity Tolerance  Patient limited by pain    Behavior During Therapy  [facility] for tasks assessed/performed       Past Medical History:  Diagnosis Date  . Arthritis   . Depression   . GERD (gastroesophageal reflux disease)   . Hypercholesteremia   . PONV (postoperative nausea and vomiting)   . Postmenopausal   . Wears glasses     Past Surgical History:  Procedure Laterality Date  . ABDOMINAL HYSTERECTOMY  2005   did not remove ovaries  . CESAREAN SECTION  2000  . COLONOSCOPY    . CONVERSION TO TOTAL KNEE Left 01/13/2018   Procedure: Revision of Left knee unicompartmental arthroplasty to total knee arthroplasty;  Surgeon: [name], MD;  Location: [facility];  Service: Orthopedics;  Laterality: Left;  . FOOT ARTHRODESIS  1995   right  . KNEE ARTHROSCOPY WITH MEDIAL MENISECTOMY Left 05/17/2014   Procedure: LEFT ARTHROSCOPY KNEE WITH PARTIAL MEDIAL MENISECTOMY, CHONDROPLASTY OF LATERAL FEMORAL CONDYLE;  Surgeon: Alta Corning, MD;  Location: [place];  Service: Orthopedics;  Laterality: Left;  . PARTIAL KNEE ARTHROPLASTY Left 01/21/2017   Procedure: UNICOMPARTMENTAL LEFT KNEE;  Surgeon: [name], MD;  Location[facility];  Service: Orthopedics;  Laterality: Left;  . Revision left knee     Dr. Wynelle Link 01-13-18    There were no vitals filed for this visit.  Subjective Assessment - 01/25/18 1024    Subjective  Pt reports  that she is in less pain today secondary to taking valium that was perscribed to her on Friday. States she is having a more difficult time standing in the last few days than normal and that standing for any amount of time is painful.  Pt states she has been decreasing the frequency of HEP exercises due to pain and increased pain in the back of the knee with exercise.      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  Yes    Pain Score  6     Pain Location  Knee    Pain Orientation  Left    Pain Type  Surgical pain    Aggravating Factors   Standing, bending the knee    Pain Relieving Factors  Medication          [facility] PT Assessment - 01/25/18 0001      AROM   AROM Assessment Site  Knee    Right/Left Knee  Left    Left Knee Extension  15    Left Knee Flexion  70      PROM   PROM Assessment Site  Knee    Right/Left Knee  Left    Left Knee Extension  12  Left Knee Flexion  72                   [facility] Adult PT Treatment/Exercise - 01/25/18 1028      Knee/Hip Exercises: Aerobic   Recumbent Bike  L 0 x 6 min (no full revolutions)      Knee/Hip Exercises: Supine   Short Arc Quad Sets  Left;10 reps    Short Arc Quad Sets Limitations  Blue bolster under bilateral knees    Heel Slides  Left;10 reps    Heel Slides Limitations  10 reps; with stretch strap around forefoot for AAROM      Knee/Hip Exercises: Sidelying   Hip ABduction  Both;10 reps;Strengthening;Limitations    Hip ABduction Limitations  Red TB at knees      Knee/Hip Exercises: Prone   Hamstring Curl  10 reps;Limitations    Hamstring Curl Limitations  L LE; 1# weight around ankle; Pt able to move through ~50% of full ROM secondary to L knee flexion ROM limitations      Vasopneumatic   Number Minutes Vasopneumatic   15 minutes    Vasopnuematic Location   Knee    Vasopneumatic Pressure  Low     Vasopneumatic Temperature   Lowest      Manual Therapy   Manual Therapy  Myofascial release;Taping    Manual therapy comments  In seated with L knee extended    Myofascial Release  CFM and myofscial release performed with light pressure to pt tolerance over insertion of hamstring musculature    [place]  One piece with tape-off tension and webbing pattern over bruising to open veins and allow blood to reintegrate to the circulatory system             PT Education - 01/25/18 1237    Education Details  Progress LAQ exercise to include resistance from yellow theraband    Person(s) Educated  Patient    Methods  Explanation    Comprehension  Verbalized understanding;Returned demonstration       PT Short Term Goals - 01/19/18 0939      PT SHORT TERM GOAL #1   Title  Pt will be independent with initial HEP    Status  On-going      PT SHORT TERM GOAL #2   Title  Pt will have 90 degrees of L knee AROM flexion    Status  On-going        PT Long Term Goals - 01/19/18 0939      PT LONG TERM GOAL #1   Title  Pt to be independent with advanced HEP    Status  On-going      PT LONG TERM GOAL #2   Title  Patient to demonstrate 0-120 degrees of AROM at L knee to improve gait and abilty to perform ADLs independently    Status  On-going      PT LONG TERM GOAL #3   Title  Patient to report ability to take care of animals and garden independently with no increased pain.     Status  On-going      PT LONG TERM GOAL #4   Title  Pt to report pain at worst 4/10 with activity     Status  On-going      PT LONG TERM GOAL #5   Title  Pt will demonstrate normal gait pattern with LRAD in order to improve  functional mobility and decrease fall risk    Status  On-going      PT LONG TERM GOAL #6   Title  Pt will have LE strength globally of 4+/5 to improve functional mobility and perform ADLs and job-related activities safely    Status   On-going            Plan - 01/25/18 1241    Clinical Impression Statement  Pt did well with treatment today with less pain than last session. She continues to have some discomfort with sustained postures including sitting, standing, and supine, but is able to tolerate them for longer periods of time with less pain. She continues to demonstrate decreased ROM, decreased strength, and decreased ability to perform ADLs including doing laundry and getting the dishes out of the dishwasher. She will continue to benefit from physical therapy to address these deficits and progress toward functional goals.     PT Treatment/Interventions  ADLs/Self Care Home Management;Cryotherapy;Electrical Stimulation;Iontophoresis 4mg /ml Dexamethasone;Moist Heat;Ultrasound;DME Instruction;Gait training;Stair training;Functional mobility training;Therapeutic activities;Therapeutic exercise;Balance training;Neuromuscular re-education;Patient/family education;Manual techniques;Scar mobilization;Passive range of motion;Dry needling;Taping;Vasopneumatic Device    PT Next Visit Plan  Assess pt LE strength as tolerated    Consulted and Agree with Plan of Care  Patient       Patient will benefit from skilled therapeutic intervention in order to improve the following deficits and impairments:  Abnormal gait, Decreased skin integrity, Hypomobility, Impaired sensation, Increased edema, Decreased activity tolerance, Decreased strength, Increased fascial restricitons, Pain, Decreased mobility, Difficulty walking, Increased muscle spasms, Decreased range of motion, Improper body mechanics, Impaired flexibility, Decreased balance  Visit Diagnosis: Acute pain of left knee  Difficulty in walking, not elsewhere classified  Other symptoms and signs involving the musculoskeletal system  Stiffness of left knee, not elsewhere classified  Muscle weakness (generalized)  Other abnormalities of gait and mobility     Problem  List Patient Active Problem List   Diagnosis Date Noted  . S/P left unicompartmental knee replacement 01/13/2018  . OA (osteoarthritis) of knee 01/21/2017  . Left knee pain 04/19/2014  . BRONCHITIS, ACUTE 07/21/2010    Shirline Frees, SPT 01/25/2018, 1:15 PM  [facility] [address]  [place], [place], [address] Phone: [phone]   Fax:  [phone]  Name: HANAH MOULTRY MRN: [id number] Date of Birth: 10-06-1955

## 2018-01-27 ENCOUNTER — Encounter: Payer: Self-pay | Admitting: Physical Therapy

## 2018-01-27 ENCOUNTER — Ambulatory Visit: Payer: 59 | Admitting: Physical Therapy

## 2018-01-27 DIAGNOSIS — M25562 Pain in left knee: Secondary | ICD-10-CM

## 2018-01-27 DIAGNOSIS — R29898 Other symptoms and signs involving the musculoskeletal system: Secondary | ICD-10-CM

## 2018-01-27 DIAGNOSIS — M25662 Stiffness of left knee, not elsewhere classified: Secondary | ICD-10-CM | POA: Diagnosis not present

## 2018-01-27 DIAGNOSIS — R262 Difficulty in walking, not elsewhere classified: Secondary | ICD-10-CM | POA: Diagnosis not present

## 2018-01-27 DIAGNOSIS — M6281 Muscle weakness (generalized): Secondary | ICD-10-CM

## 2018-01-27 DIAGNOSIS — R2689 Other abnormalities of gait and mobility: Secondary | ICD-10-CM

## 2018-01-27 DIAGNOSIS — K219 Gastro-esophageal reflux disease without esophagitis: Secondary | ICD-10-CM | POA: Diagnosis not present

## 2018-01-27 NOTE — Therapy (Addendum)
[facility] [address]  [place], [place], [address] Phone: [phone]   Fax:  [phone]  Physical Therapy Treatment  Patient Details  Name: Joanna Willis MRN: [id number] Date of Birth: 12/22/1955 Referring Provider: Theresa Duty, PA-C   Encounter Date: 01/27/2018  PT End of Session - 01/27/18 1102    Visit Number  5    Number of Visits  18    Date for PT Re-Evaluation  03/01/18    PT Start Time  1059    PT Stop Time  1153    PT Time Calculation (min)  54 min    Activity Tolerance  Patient tolerated treatment well    Behavior During Therapy  [facility] for tasks assessed/performed       Past Medical History:  Diagnosis Date  . Arthritis   . Depression   . GERD (gastroesophageal reflux disease)   . Hypercholesteremia   . PONV (postoperative nausea and vomiting)   . Postmenopausal   . Wears glasses     Past Surgical History:  Procedure Laterality Date  . ABDOMINAL HYSTERECTOMY  2005   did not remove ovaries  . CESAREAN SECTION  2000  . COLONOSCOPY    . CONVERSION TO TOTAL KNEE Left 01/13/2018   Procedure: Revision of Left knee unicompartmental arthroplasty to total knee arthroplasty;  Surgeon: [name], MD;  [facility];  Service: Orthopedics;  Laterality: Left;  . FOOT ARTHRODESIS  1995   right  . KNEE ARTHROSCOPY WITH MEDIAL MENISECTOMY Left 05/17/2014   Procedure: LEFT ARTHROSCOPY KNEE WITH PARTIAL MEDIAL MENISECTOMY, CHONDROPLASTY OF LATERAL FEMORAL CONDYLE;  Surgeon: Alta Corning, MD;  Location: [place];  Service: Orthopedics;  Laterality: Left;  . PARTIAL KNEE ARTHROPLASTY Left 01/21/2017   Procedure: UNICOMPARTMENTAL LEFT KNEE;  Surgeon: [name], MD;  [facility];  Service: Orthopedics;  Laterality: Left;  . Revision left knee     Dr. Wynelle Link 01-13-18    There were no vitals filed for this visit.  Subjective Assessment - 01/27/18 1312    Subjective  Pt  reports that she thinks she hit a turning point as far as pain goes yesterday. She has been better able to sleep through the night without interruption and do activities with her family like going out to each, although not without increased pain and having to constantly adjust her position.     Limitations  Sitting;Standing;Walking;House hold activities    How long can you sit comfortably?  A few minutes without having to adjust positions     How long can you stand comfortably?  About 5 min, but is becoming more tolerable     How long can you walk comfortably?  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  Yes    Pain Score  5     Pain Location  Knee    Pain Orientation  Left    Pain Descriptors / Indicators  Aching;Tightness    Pain Type  Surgical pain    Pain Frequency  Constant    Aggravating Factors   Sitting in a booth, standing when doing dishes     Pain Relieving Factors  Medication, ICE    Effect of Pain on Daily Activities  Unable to perform all ADLs to PLOF; at this time is unable to help her family pack belongings for upcoming  move    Multiple Pain Sites  No         [facility] PT Assessment - 01/27/18 0001      ROM / Strength   AROM / PROM / Strength  Strength      PROM   PROM Assessment Site  Knee    Right/Left Knee  Left    Left Knee Extension  11    Left Knee Flexion  74      Strength   Strength Assessment Site  Knee;Hip    Right/Left Hip  Left    Left Hip Flexion  4/5    Left Hip Extension  3-/5    Right/Left Knee  Left    Left Knee Flexion  3-/5    Left Knee Extension  3+/5                   [facility] Adult PT Treatment/Exercise - 01/27/18 0001      Ambulation/Gait   Ambulation/Gait  Yes    Ambulation/Gait Assistance  6: Modified independent (Device/Increase time);5: Supervision;4: Min guard    Ambulation/Gait Assistance Details  VC for adequate step length with R LE to facilitate  adequate plantarflexion push-off with L LE. Pt also required cueing for forward walker translation when in SLS on the L foot to allow enough room for adequate step length with the R LE.  Supervision to CGA with FWW, Min assist to supervision with SPC. With [facility] pt placing a significant amount of weight through R UE for stability, and therefore activity stopped after 45 ft     Ambulation Distance (Feet)  90 Feet 45 ft with SPC     Assistive device  Rolling walker;Straight cane    Gait Pattern  Step-through pattern;Decreased stride length;Decreased hip/knee flexion - left;Decreased dorsiflexion - left;Decreased weight shift to left;Antalgic;Poor foot clearance - left;Decreased step length - right    Ambulation Surface  Level;Indoor    Gait Comments  Pt demonstrates improved heel/toe pattern during today's session      Knee/Hip Exercises: Aerobic   Recumbent Bike  L0 x 6 min (no full revolutions)      Knee/Hip Exercises: Standing   Knee Flexion  Left;10 reps;Limitations    Knee Flexion Limitations  With 2 UE assist    Functional Squat  10 reps    Functional Squat Limitations  Mini squats to pt tolerance with VC and TC for equal WB through both       Knee/Hip Exercises: Seated   Sit to Sand  15 reps;Other (comment) With mirror for EC for equal WB      Vasopneumatic   Number Minutes Vasopneumatic   15 minutes    Vasopnuematic Location   Knee    Vasopneumatic Pressure  Medium    Vasopneumatic Temperature   Lowest             PT Education - 01/27/18 1336    Education Details  Pt given new HEP exercises today    Person(s) Educated  Patient    Methods  Explanation;Handout    Comprehension  Verbalized understanding;Returned demonstration;Verbal cues required       PT Short Term Goals - 01/19/18 0939      PT SHORT TERM GOAL #1   Title  Pt will be independent with initial HEP    Status  On-going      PT SHORT TERM GOAL #2   Title  Pt will have 90 degrees of L knee AROM flexion  Status  On-going        PT Long Term Goals - 01/19/18 0939      PT LONG TERM GOAL #1   Title  Pt to be independent with advanced HEP    Status  On-going      PT LONG TERM GOAL #2   Title  Patient to demonstrate 0-120 degrees of AROM at L knee to improve gait and abilty to perform ADLs independently    Status  On-going      PT LONG TERM GOAL #3   Title  Patient to report ability to take care of animals and garden independently with no increased pain.     Status  On-going      PT LONG TERM GOAL #4   Title  Pt to report pain at worst 4/10 with activity     Status  On-going      PT LONG TERM GOAL #5   Title  Pt will demonstrate normal gait pattern with LRAD in order to improve functional mobility and decrease fall risk    Status  On-going      PT LONG TERM GOAL #6   Title  Pt will have LE strength globally of 4+/5 to improve functional mobility and perform ADLs and job-related activities safely    Status  On-going            Plan - 01/27/18 1255    Clinical Impression Statement  Aniah is making progress with her tolerance to functional activities with each visit as her pain levels decrease, however is still significantly limited in her L knee ROM secondary to pain. At today's session she stated that she feels that she has hit a turning point in reference to her pain levels, and that she is better able to manage her pain with less medication. Based on this, her knee ROM measurements are anticipated to make a significant change in the coming weeks. Today's session focused on closed chain weight bearing activities to address strength deficits in the L LE, as well as to address gait abnormalities including decreased hip and knee flexion during gait and to further increase knee ROM. At this time, pt will benefit from physical therapy to continue to address ROM limitations, LE strength deficits, gait abnormalities, and progress toward functional goals.     PT Treatment/Interventions   ADLs/Self Care Home Management;Cryotherapy;Electrical Stimulation;Iontophoresis 4mg /ml Dexamethasone;Moist Heat;Ultrasound;DME Instruction;Gait training;Stair training;Functional mobility training;Therapeutic activities;Therapeutic exercise;Balance training;Neuromuscular re-education;Patient/family education;Manual techniques;Scar mobilization;Passive range of motion;Dry needling;Taping;Vasopneumatic Device    Consulted and Agree with Plan of Care  Patient       Patient will benefit from skilled therapeutic intervention in order to improve the following deficits and impairments:  Abnormal gait, Decreased skin integrity, Hypomobility, Impaired sensation, Increased edema, Decreased activity tolerance, Decreased strength, Increased fascial restricitons, Pain, Decreased mobility, Difficulty walking, Increased muscle spasms, Decreased range of motion, Improper body mechanics, Impaired flexibility, Decreased balance  Visit Diagnosis: Acute pain of left knee  Difficulty in walking, not elsewhere classified  Other symptoms and signs involving the musculoskeletal system  Stiffness of left knee, not elsewhere classified  Muscle weakness (generalized)  Other abnormalities of gait and mobility     Problem List Patient Active Problem List   Diagnosis Date Noted  . S/P left unicompartmental knee replacement 01/13/2018  . OA (osteoarthritis) of knee 01/21/2017  . Left knee pain 04/19/2014  . BRONCHITIS, ACUTE 07/21/2010    Shirline Frees, SPT 01/27/2018, 1:41 PM  [facility] [address]  [place], [place], [address] Phone: [phone]   Fax:  [phone]  Name: AFIA MESSENGER MRN: [id number] Date of Birth: Mar 26, 1956

## 2018-01-28 MED FILL — HYDROmorphone HCL 2 MG TABS: 2 | 7 days supply | Qty: 42 | Fill #0

## 2018-01-29 ENCOUNTER — Ambulatory Visit: Payer: 59

## 2018-01-29 DIAGNOSIS — K219 Gastro-esophageal reflux disease without esophagitis: Secondary | ICD-10-CM | POA: Diagnosis not present

## 2018-01-29 DIAGNOSIS — R2689 Other abnormalities of gait and mobility: Secondary | ICD-10-CM | POA: Diagnosis not present

## 2018-01-29 DIAGNOSIS — R29898 Other symptoms and signs involving the musculoskeletal system: Secondary | ICD-10-CM | POA: Diagnosis not present

## 2018-01-29 DIAGNOSIS — M6281 Muscle weakness (generalized): Secondary | ICD-10-CM | POA: Diagnosis not present

## 2018-01-29 DIAGNOSIS — R262 Difficulty in walking, not elsewhere classified: Secondary | ICD-10-CM | POA: Diagnosis not present

## 2018-01-29 DIAGNOSIS — M25662 Stiffness of left knee, not elsewhere classified: Secondary | ICD-10-CM

## 2018-01-29 DIAGNOSIS — M25562 Pain in left knee: Secondary | ICD-10-CM

## 2018-01-29 NOTE — Therapy (Signed)
[facility] [address]  [place], [place], [address] Phone: [phone]   Fax:  [phone]  Physical Therapy Treatment  Patient Details  Name: Joanna Willis MRN: [id number] Date of Birth: [birth year] Referring Provider: Theresa Duty, PA-C   Encounter Date: 01/29/2018  PT End of Session - 01/29/18 1025    Visit Number  6    Number of Visits  18    Date for PT Re-Evaluation  03/01/18    PT Start Time  1019    PT Stop Time  1057    PT Time Calculation (min)  38 min    Activity Tolerance  Patient tolerated treatment well    Behavior During Therapy  [facility] for tasks assessed/performed       Past Medical History:  Diagnosis Date  . Arthritis   . Depression   . GERD (gastroesophageal reflux disease)   . Hypercholesteremia   . PONV (postoperative nausea and vomiting)   . Postmenopausal   . Wears glasses     Past Surgical History:  Procedure Laterality Date  . ABDOMINAL HYSTERECTOMY  2005   did not remove ovaries  . CESAREAN SECTION  2000  . COLONOSCOPY    . CONVERSION TO TOTAL KNEE Left 01/13/2018   Procedure: Revision of Left knee unicompartmental arthroplasty to total knee arthroplasty;  Surgeon: [name], MD;  [facility];  Service: Orthopedics;  Laterality: Left;  . FOOT ARTHRODESIS  1995   right  . KNEE ARTHROSCOPY WITH MEDIAL MENISECTOMY Left 05/17/2014   Procedure: LEFT ARTHROSCOPY KNEE WITH PARTIAL MEDIAL MENISECTOMY, CHONDROPLASTY OF LATERAL FEMORAL CONDYLE;  Surgeon: Alta Corning, MD;  Location: [place];  Service: Orthopedics;  Laterality: Left;  . PARTIAL KNEE ARTHROPLASTY Left 01/21/2017   Procedure: UNICOMPARTMENTAL LEFT KNEE;  Surgeon: [name], MD;  [facility];  Service: Orthopedics;  Laterality: Left;  . Revision left knee     Dr. Wynelle Link 01-13-18    There were no vitals filed for this visit.  Subjective Assessment - 01/29/18 1021    Subjective  Pt.  noting she was very busy yesterday and has had increased soreness following yesterday.       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  Yes    Pain Score  6     Pain Location  Knee    Pain Orientation  Left    Pain Descriptors / Indicators  Aching;Tightness    Pain Type  Surgical pain                       [facility] Adult PT Treatment/Exercise - 01/29/18 1022      Knee/Hip Exercises: Aerobic   Nustep  Lvl 4, 6 min (level 7) (UE/LE      Knee/Hip Exercises: Standing   Heel Raises  Both;10 reps    Heel Raises Limitations  at counter with cues for full knee extension     Knee Flexion  Left   x 12    Functional Squat  --   x 12    Functional Squat Limitations  Mini squats ad counter    cues for even wt. distribution    Other Standing Knee Exercises  Standing toe-clears to 6" step x 15 reps; working on avoiding circumduction and promoting increased hip/knee flexion       Knee/Hip Exercises: Seated  Long [employer]  Left;15 reps    Other Seated Knee/Hip Exercises  Seated fitter leg press (2 blue bands) x 15 reps   with 3" hold into flexion stretch at start of movement    Hamstring Curl  Left;15 reps;Strengthening    Hamstring Limitations  with yellow TB at ankle               PT Short Term Goals - 01/19/18 0939      PT SHORT TERM GOAL #1   Title  Pt will be independent with initial HEP    Status  On-going      PT SHORT TERM GOAL #2   Title  Pt will have 90 degrees of L knee AROM flexion    Status  On-going        PT Long Term Goals - 01/19/18 0939      PT LONG TERM GOAL #1   Title  Pt to be independent with advanced HEP    Status  On-going      PT LONG TERM GOAL #2   Title  Patient to demonstrate 0-120 degrees of AROM at L knee to improve gait and abilty to perform ADLs independently    Status  On-going      PT LONG TERM GOAL #3   Title  Patient to report ability to take care of animals and  garden independently with no increased pain.     Status  On-going      PT LONG TERM GOAL #4   Title  Pt to report pain at worst 4/10 with activity     Status  On-going      PT LONG TERM GOAL #5   Title  Pt will demonstrate normal gait pattern with LRAD in order to improve functional mobility and decrease fall risk    Status  On-going      PT LONG TERM GOAL #6   Title  Pt will have LE strength globally of 4+/5 to improve functional mobility and perform ADLs and job-related activities safely    Status  On-going            Plan - 01/29/18 1025    Clinical Impression Statement  [name] reporting L knee pain levels have been increased today after being, "very busy yesterday".  Tolerated addition of standing TKE with yellow TB resistance, seated HS curl, and progression of mini squat well today.  Vasoneumatic device in use to end visit and pt. declining ice pack reporting she prefers to ice at home.  Will continue to progress toward goals.      PT Treatment/Interventions  ADLs/Self Care Home Management;Cryotherapy;Electrical Stimulation;Iontophoresis 4mg /ml Dexamethasone;Moist Heat;Ultrasound;DME Instruction;Gait training;Stair training;Functional mobility training;Therapeutic activities;Therapeutic exercise;Balance training;Neuromuscular re-education;Patient/family education;Manual techniques;Scar mobilization;Passive range of motion;Dry needling;Taping;Vasopneumatic Device    Consulted and Agree with Plan of Care  Patient       Patient will benefit from skilled therapeutic intervention in order to improve the following deficits and impairments:  Abnormal gait, Decreased skin integrity, Hypomobility, Impaired sensation, Increased edema, Decreased activity tolerance, Decreased strength, Increased fascial restricitons, Pain, Decreased mobility, Difficulty walking, Increased muscle spasms, Decreased range of motion, Improper body mechanics, Impaired flexibility, Decreased balance  Visit  Diagnosis: Acute pain of left knee  Difficulty in walking, not elsewhere classified  Other symptoms and signs involving the musculoskeletal system  Stiffness of left knee, not elsewhere classified  Muscle weakness (generalized)  Other abnormalities of gait and mobility     Problem List Patient Active Problem  List   Diagnosis Date Noted  . S/P left unicompartmental knee replacement 01/13/2018  . OA (osteoarthritis) of knee 01/21/2017  . Left knee pain 04/19/2014  . BRONCHITIS, ACUTE 07/21/2010    Bess Harvest, PTA 01/29/18 12:27 PM   [facility] [address]  [place], [place], [address] Phone: [phone]   Fax:  [phone]  Name: ANTONIA JICHA MRN: [id number] Date of Birth: [birth year]

## 2018-02-01 ENCOUNTER — Ambulatory Visit: Payer: 59

## 2018-02-01 DIAGNOSIS — R262 Difficulty in walking, not elsewhere classified: Secondary | ICD-10-CM

## 2018-02-01 DIAGNOSIS — M6281 Muscle weakness (generalized): Secondary | ICD-10-CM

## 2018-02-01 DIAGNOSIS — R29898 Other symptoms and signs involving the musculoskeletal system: Secondary | ICD-10-CM | POA: Diagnosis not present

## 2018-02-01 DIAGNOSIS — M25662 Stiffness of left knee, not elsewhere classified: Secondary | ICD-10-CM | POA: Diagnosis not present

## 2018-02-01 DIAGNOSIS — R2689 Other abnormalities of gait and mobility: Secondary | ICD-10-CM

## 2018-02-01 DIAGNOSIS — M25562 Pain in left knee: Secondary | ICD-10-CM

## 2018-02-01 DIAGNOSIS — K219 Gastro-esophageal reflux disease without esophagitis: Secondary | ICD-10-CM | POA: Diagnosis not present

## 2018-02-01 NOTE — Therapy (Signed)
[facility] [address]  [place], [place], [address] Phone: [phone]   Fax:  [phone]  Physical Therapy Treatment  Patient Details  Name: Joanna Willis MRN: [id number] Date of Birth: Apr 16, 1956 Referring Provider: Theresa Duty, PA-C   Encounter Date: 02/01/2018  PT End of Session - 02/01/18 1026    Visit Number  7    Number of Visits  18    Date for PT Re-Evaluation  03/01/18    PT Start Time  1020    PT Stop Time  1110    PT Time Calculation (min)  50 min    Activity Tolerance  Patient tolerated treatment well    Behavior During Therapy  [facility] for tasks assessed/performed       Past Medical History:  Diagnosis Date  . Arthritis   . Depression   . GERD (gastroesophageal reflux disease)   . Hypercholesteremia   . PONV (postoperative nausea and vomiting)   . Postmenopausal   . Wears glasses     Past Surgical History:  Procedure Laterality Date  . ABDOMINAL HYSTERECTOMY  2005   did not remove ovaries  . CESAREAN SECTION  2000  . COLONOSCOPY    . CONVERSION TO TOTAL KNEE Left 01/13/2018   Procedure: Revision of Left knee unicompartmental arthroplasty to total knee arthroplasty;  Surgeon: [name], MD;  Location: [facility];  Service: Orthopedics;  Laterality: Left;  . FOOT ARTHRODESIS  1995   right  . KNEE ARTHROSCOPY WITH MEDIAL MENISECTOMY Left 05/17/2014   Procedure: LEFT ARTHROSCOPY KNEE WITH PARTIAL MEDIAL MENISECTOMY, CHONDROPLASTY OF LATERAL FEMORAL CONDYLE;  Surgeon: Alta Corning, MD;  Location: [facility];  Service: Orthopedics;  Laterality: Left;  . PARTIAL KNEE ARTHROPLASTY Left 01/21/2017   Procedure: UNICOMPARTMENTAL LEFT KNEE;  Surgeon: [name], MD;  [facility];  Service: Orthopedics;  Laterality: Left;  . Revision left knee     Dr. Wynelle Link 01-13-18    There were no vitals filed for this visit.  Subjective Assessment - 02/01/18 1025    Subjective   Pt. noting she is stressed with the packing she needs to do with her house move.      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  Yes    Pain Score  6     Pain Location  Knee    Pain Orientation  Left    Pain Descriptors / Indicators  Aching    Pain Type  Surgical pain    Pain Frequency  Intermittent    Multiple Pain Sites  No                       [facility] Adult PT Treatment/Exercise - 02/01/18 1027      Knee/Hip Exercises: Aerobic   Recumbent Bike  L0 x 6 min (no full revolutions)      Knee/Hip Exercises: Standing   Terminal Knee Extension  Left;Theraband;15 reps    Theraband Level (Terminal Knee Extension)  Level 2 (Red)    Terminal Knee Extension Limitations  standing with L Wt. shift     Lateral Step Up  Left;10 reps;Step Height: 4";Hand Hold: 2    Lateral Step Up Limitations  2 HH assist     Forward Step Up  Left;10 reps;Step Height: 6";Hand Hold: 2    Forward Step Up Limitations  2 HH  support    Functional Squat  --   x 12 reps    Functional Squat Limitations  improved wt. distribution     Other Standing Knee Exercises  B staggered stance forward/back wt shift with red TB TKE x 10 reps      Knee/Hip Exercises: Seated   Heel Slides  Left;10 reps;AAROM    Heel Slides Limitations  Opposite LE stretching       Knee/Hip Exercises: Supine   Knee Flexion  Left;10 reps;AAROM    Knee Flexion Limitations  with end range flexion stretch with heels on peanut p-ball and 5" hold with overpressure from therapist     Other Supine Knee/Hip Exercises  Straight leg bridge with heels on peanut p-ball x 10 reps       Vasopneumatic   Number Minutes Vasopneumatic   10 minutes    Vasopnuematic Location   Knee    Vasopneumatic Pressure  Medium      Manual Therapy   Manual Therapy  Joint mobilization;Passive ROM    Manual therapy comments  supine    Joint Mobilization  L knee A/P mobs, L patellar mobs all directions (limited  in all motions) for improved ROM                PT Short Term Goals - 01/19/18 4403      PT SHORT TERM GOAL #1   Title  Pt will be independent with initial HEP    Status  On-going      PT SHORT TERM GOAL #2   Title  Pt will have 90 degrees of L knee AROM flexion    Status  On-going        PT Long Term Goals - 01/19/18 0939      PT LONG TERM GOAL #1   Title  Pt to be independent with advanced HEP    Status  On-going      PT LONG TERM GOAL #2   Title  Patient to demonstrate 0-120 degrees of AROM at L knee to improve gait and abilty to perform ADLs independently    Status  On-going      PT LONG TERM GOAL #3   Title  Patient to report ability to take care of animals and garden independently with no increased pain.     Status  On-going      PT LONG TERM GOAL #4   Title  Pt to report pain at worst 4/10 with activity     Status  On-going      PT LONG TERM GOAL #5   Title  Pt will demonstrate normal gait pattern with LRAD in order to improve functional mobility and decrease fall risk    Status  On-going      PT LONG TERM GOAL #6   Title  Pt will have LE strength globally of 4+/5 to improve functional mobility and perform ADLs and job-related activities safely    Status  On-going            Plan - 02/01/18 1026    Clinical Impression Statement  Joanna Willis reporting continue high L knee pain levels, which has limited sleep.  Tolerated all strengthening activities well today with addition of forward and lateral step-ups.  ROM activities with end range flexion still limited by pt. pain levels.  Pt. strongly encouraged today to continue focusing on HEP activities for improved flexion/ext. ROM.  Ended session with ice/compression to L knee to decrease post-exercise swelling and pain.  PT Treatment/Interventions  ADLs/Self Care Home Management;Cryotherapy;Electrical Stimulation;Iontophoresis 4mg /ml Dexamethasone;Moist Heat;Ultrasound;DME Instruction;Gait training;Stair  training;Functional mobility training;Therapeutic activities;Therapeutic exercise;Balance training;Neuromuscular re-education;Patient/family education;Manual techniques;Scar mobilization;Passive range of motion;Dry needling;Taping;Vasopneumatic Device    Consulted and Agree with Plan of Care  Patient       Patient will benefit from skilled therapeutic intervention in order to improve the following deficits and impairments:  Abnormal gait, Decreased skin integrity, Hypomobility, Impaired sensation, Increased edema, Decreased activity tolerance, Decreased strength, Increased fascial restricitons, Pain, Decreased mobility, Difficulty walking, Increased muscle spasms, Decreased range of motion, Improper body mechanics, Impaired flexibility, Decreased balance  Visit Diagnosis: Acute pain of left knee  Difficulty in walking, not elsewhere classified  Other symptoms and signs involving the musculoskeletal system  Stiffness of left knee, not elsewhere classified  Muscle weakness (generalized)  Other abnormalities of gait and mobility     Problem List Patient Active Problem List   Diagnosis Date Noted  . S/P left unicompartmental knee replacement 01/13/2018  . OA (osteoarthritis) of knee 01/21/2017  . Left knee pain 04/19/2014  . BRONCHITIS, ACUTE 07/21/2010    Bess Harvest, PTA 02/01/18 1:08 PM   [facility] [address]  [place], [place], [address] Phone: [phone]   Fax:  [phone]  Name: Joanna Willis MRN: [id number] Date of Birth: [birth year]

## 2018-02-03 ENCOUNTER — Ambulatory Visit: Payer: 59 | Admitting: Physical Therapy

## 2018-02-03 ENCOUNTER — Encounter: Payer: Self-pay | Admitting: Physical Therapy

## 2018-02-03 DIAGNOSIS — M25662 Stiffness of left knee, not elsewhere classified: Secondary | ICD-10-CM

## 2018-02-03 DIAGNOSIS — R2689 Other abnormalities of gait and mobility: Secondary | ICD-10-CM

## 2018-02-03 DIAGNOSIS — R29898 Other symptoms and signs involving the musculoskeletal system: Secondary | ICD-10-CM

## 2018-02-03 DIAGNOSIS — M6281 Muscle weakness (generalized): Secondary | ICD-10-CM

## 2018-02-03 DIAGNOSIS — M25562 Pain in left knee: Secondary | ICD-10-CM | POA: Diagnosis not present

## 2018-02-03 DIAGNOSIS — K219 Gastro-esophageal reflux disease without esophagitis: Secondary | ICD-10-CM | POA: Diagnosis not present

## 2018-02-03 DIAGNOSIS — R262 Difficulty in walking, not elsewhere classified: Secondary | ICD-10-CM

## 2018-02-03 NOTE — Therapy (Addendum)
[facility] [address]  [place], [place], [address] Phone: [phone]   Fax:  [phone]  Physical Therapy Treatment  Patient Details  Name: Joanna Willis MRN: [id number] Date of Birth: 18-Mar-1956 Referring Provider: Theresa Duty, PA-C   Encounter Date: 02/03/2018  PT End of Session - 02/03/18 1017    Visit Number  8    Number of Visits  18    Date for PT Re-Evaluation  03/01/18    PT Start Time  1016    PT Stop Time  1108    PT Time Calculation (min)  52 min    Activity Tolerance  Patient tolerated treatment well    Behavior During Therapy  [facility] for tasks assessed/performed       Past Medical History:  Diagnosis Date  . Arthritis   . Depression   . GERD (gastroesophageal reflux disease)   . Hypercholesteremia   . PONV (postoperative nausea and vomiting)   . Postmenopausal   . Wears glasses     Past Surgical History:  Procedure Laterality Date  . ABDOMINAL HYSTERECTOMY  2005   did not remove ovaries  . CESAREAN SECTION  2000  . COLONOSCOPY    . CONVERSION TO TOTAL KNEE Left 01/13/2018   Procedure: Revision of Left knee unicompartmental arthroplasty to total knee arthroplasty;  Surgeon: [name], MD;  [facility];  Service: Orthopedics;  Laterality: Left;  . FOOT ARTHRODESIS  1995   right  . KNEE ARTHROSCOPY WITH MEDIAL MENISECTOMY Left 05/17/2014   Procedure: LEFT ARTHROSCOPY KNEE WITH PARTIAL MEDIAL MENISECTOMY, CHONDROPLASTY OF LATERAL FEMORAL CONDYLE;  Surgeon: Alta Corning, MD;  Location: [facility];  Service: Orthopedics;  Laterality: Left;  . PARTIAL KNEE ARTHROPLASTY Left 01/21/2017   Procedure: UNICOMPARTMENTAL LEFT KNEE;  Surgeon: [name], MD;  [facility];  Service: Orthopedics;  Laterality: Left;  . Revision left knee     Dr. Wynelle Link 01-13-18    There were no vitals filed for this visit.  Subjective Assessment - 02/03/18 1116    Subjective  Pt  reports that she is well today and has not taken any narcotics since last night around 6 pm as she had to drive herself to therapy session today. She has continued taking tylenol to control the pain.     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  Yes    Pain Score  5     Pain Location  Knee    Pain Orientation  Left    Pain Descriptors / Indicators  Aching;Other (Comment)   stiffness   Pain Type  Surgical pain    Multiple Pain Sites  No         [facility] PT Assessment - 02/03/18 0001      Assessment   Next MD Visit  02/16/2018      PROM   Left Knee Extension  8    Left Knee Flexion  77                   [facility] Adult PT Treatment/Exercise - 02/03/18 0001      Ambulation/Gait   Ambulation/Gait  Yes    Ambulation/Gait Assistance  6: Modified independent (Device/Increase time)    Ambulation/Gait Assistance Details  VC and TC for knee extension at appropriate time during gait cycle,  as well as for appropriate heel strike.     Ambulation Distance (Feet)  220 Feet    Assistive device  Straight cane    Gait Pattern  Decreased step length - right;Decreased step length - left;Decreased stride length;Decreased hip/knee flexion - left;Decreased dorsiflexion - left;Left foot flat    Ambulation Surface  Level;Indoor    Gait Comments  Pt now amb primarily with SPC and demonstrates good patterning and improve stability with SPC during today's session      Knee/Hip Exercises: Standing   Knee Flexion  --    Forward Step Up  Left;10 reps;Hand Hold: 1;Step Height: 6"    Forward Step Up Limitations  With VC from therapist to prevent excessive L hip hiking to encourage knee flexion to step onto step    Functional Squat  10 reps    Functional Squat Limitations  with TC from therapist at L hip to encourage equal WB through both LEs. Pt had mild weight shift to R toward end  range of L knee flexion during activity    Other Standing Knee Exercises  Hip touches to wall posteriorly with feet placed approx 1 ft from wall. Pt required VC to prevent knee flexion during activity    Other Standing Knee Exercises  With L LE elevated on lat pull down batca seat. Pt performed 10 reps of lunging forward to encourage knee flexion, and leaning back into a hamstring stretch position to encourage knee extension. PT cued to prevent rising up on the toes with the R LE when lunging forward to prevent losing knee flexion; Hold 10 seconds in each direction      Knee/Hip Exercises: Seated   Long Arc Quad  20 reps;Left    Sit to Sand  20 reps;without UE support   L foot approx 2 in anterior to R foot     Knee/Hip Exercises: Supine   Heel Slides  Left;5 reps    Heel Slides Limitations  With green stretch strap for assistance             PT Education - 02/03/18 1117    Education Details  Pt provided with new HEP exercise today     Person(s) Educated  Patient    Methods  Explanation;Demonstration;Handout    Comprehension  Verbalized understanding;Returned demonstration       PT Short Term Goals - 01/19/18 0939      PT SHORT TERM GOAL #1   Title  Pt will be independent with initial HEP    Status  On-going      PT SHORT TERM GOAL #2   Title  Pt will have 90 degrees of L knee AROM flexion    Status  On-going        PT Long Term Goals - 01/19/18 0939      PT LONG TERM GOAL #1   Title  Pt to be independent with advanced HEP    Status  On-going      PT LONG TERM GOAL #2   Title  Patient to demonstrate 0-120 degrees of AROM at L knee to improve gait and abilty to perform ADLs independently    Status  On-going      PT LONG TERM GOAL #3   Title  Patient to report ability to take care of animals and garden independently with no increased pain.     Status  On-going      PT LONG TERM GOAL #4   Title  Pt to report  pain at worst 4/10 with activity     Status  On-going       PT LONG TERM GOAL #5   Title  Pt will demonstrate normal gait pattern with LRAD in order to improve functional mobility and decrease fall risk    Status  On-going      PT LONG TERM GOAL #6   Title  Pt will have LE strength globally of 4+/5 to improve functional mobility and perform ADLs and job-related activities safely    Status  On-going            Plan - 02/03/18 1120    Clinical Impression Statement  Joanna Willis tolerated treatment session well today with emphasis on closed chain strengthening exercises and addressing some gait abnormalities. She continues to have deficits in L knee ROM, which are contributing to her gait abnormalities including L foot flat due to lack of knee extension at heel strike, and decreased step length secondary to decreased flexion. She will continue to benefit from therapy to address these impairments, continue to improve her strength, and progress toward functional goals.     Rehab Potential  Good    PT Treatment/Interventions  ADLs/Self Care Home Management;Cryotherapy;Electrical Stimulation;Iontophoresis 4mg /ml Dexamethasone;Moist Heat;Ultrasound;DME Instruction;Gait training;Stair training;Functional mobility training;Therapeutic activities;Therapeutic exercise;Balance training;Neuromuscular re-education;Patient/family education;Manual techniques;Scar mobilization;Passive range of motion;Dry needling;Taping;Vasopneumatic Device    PT Next Visit Plan  Assess short-term goals    Consulted and Agree with Plan of Care  Patient       Patient will benefit from skilled therapeutic intervention in order to improve the following deficits and impairments:  Abnormal gait, Decreased skin integrity, Hypomobility, Impaired sensation, Increased edema, Decreased activity tolerance, Decreased strength, Increased fascial restricitons, Pain, Decreased mobility, Difficulty walking, Increased muscle spasms, Decreased range of motion, Improper body mechanics, Impaired  flexibility, Decreased balance  Visit Diagnosis: Acute pain of left knee  Difficulty in walking, not elsewhere classified  Other symptoms and signs involving the musculoskeletal system  Stiffness of left knee, not elsewhere classified  Muscle weakness (generalized)  Other abnormalities of gait and mobility     Problem List Patient Active Problem List   Diagnosis Date Noted  . S/P left unicompartmental knee replacement 01/13/2018  . OA (osteoarthritis) of knee 01/21/2017  . Left knee pain 04/19/2014  . BRONCHITIS, ACUTE 07/21/2010    Shirline Frees, SPT  02/03/2018, 11:56 AM  [facility] [address]  [place], [place], [address] Phone: [phone]   Fax:  [phone]  Name: Joanna Willis MRN: [id number] Date of Birth: 03/06/1956

## 2018-02-05 ENCOUNTER — Ambulatory Visit: Payer: 59

## 2018-02-08 ENCOUNTER — Ambulatory Visit: Payer: 59 | Admitting: Physical Therapy

## 2018-02-08 ENCOUNTER — Encounter: Payer: Self-pay | Admitting: Physical Therapy

## 2018-02-08 DIAGNOSIS — M25562 Pain in left knee: Secondary | ICD-10-CM

## 2018-02-08 DIAGNOSIS — R262 Difficulty in walking, not elsewhere classified: Secondary | ICD-10-CM | POA: Diagnosis not present

## 2018-02-08 DIAGNOSIS — M25662 Stiffness of left knee, not elsewhere classified: Secondary | ICD-10-CM | POA: Diagnosis not present

## 2018-02-08 DIAGNOSIS — R29898 Other symptoms and signs involving the musculoskeletal system: Secondary | ICD-10-CM | POA: Diagnosis not present

## 2018-02-08 DIAGNOSIS — M6281 Muscle weakness (generalized): Secondary | ICD-10-CM | POA: Diagnosis not present

## 2018-02-08 DIAGNOSIS — R2689 Other abnormalities of gait and mobility: Secondary | ICD-10-CM | POA: Diagnosis not present

## 2018-02-08 DIAGNOSIS — K219 Gastro-esophageal reflux disease without esophagitis: Secondary | ICD-10-CM | POA: Diagnosis not present

## 2018-02-08 NOTE — Therapy (Signed)
[facility] [address]  [place], [place], [address] Phone: [phone]   Fax:  [phone]  Physical Therapy Treatment  Patient Details  Name: Joanna Willis MRN: [id number] Date of Birth: Dec 17, 1955 Referring Provider: Theresa Duty, PA-C   Encounter Date: 02/08/2018  PT End of Session - 02/08/18 0924    Visit Number  9    Number of Visits  18    Date for PT Re-Evaluation  03/01/18    Authorization Type  [facility]    PT Start Time  0924    PT Stop Time  1029    PT Time Calculation (min)  65 min    Activity Tolerance  Patient tolerated treatment well    Behavior During Therapy  [facility] for tasks assessed/performed       Past Medical History:  Diagnosis Date  . Arthritis   . Depression   . GERD (gastroesophageal reflux disease)   . Hypercholesteremia   . PONV (postoperative nausea and vomiting)   . Postmenopausal   . Wears glasses     Past Surgical History:  Procedure Laterality Date  . ABDOMINAL HYSTERECTOMY  2005   did not remove ovaries  . CESAREAN SECTION  2000  . COLONOSCOPY    . CONVERSION TO TOTAL KNEE Left 01/13/2018   Procedure: Revision of Left knee unicompartmental arthroplasty to total knee arthroplasty;  Surgeon: [name], MD;  [facility];  Service: Orthopedics;  Laterality: Left;  . FOOT ARTHRODESIS  1995   right  . KNEE ARTHROSCOPY WITH MEDIAL MENISECTOMY Left 05/17/2014   Procedure: LEFT ARTHROSCOPY KNEE WITH PARTIAL MEDIAL MENISECTOMY, CHONDROPLASTY OF LATERAL FEMORAL CONDYLE;  Surgeon: Alta Corning, MD;  Location: [facility];  Service: Orthopedics;  Laterality: Left;  . PARTIAL KNEE ARTHROPLASTY Left 01/21/2017   Procedure: UNICOMPARTMENTAL LEFT KNEE;  Surgeon: [name], MD;  [facility];  Service: Orthopedics;  Laterality: Left;  . Revision left knee     Dr. Wynelle Link 01-13-18    There were no vitals filed for this visit.  Subjective Assessment -  02/08/18 0927    Subjective  Pt reporting she missed last appt due to severe constipation from pain meds. Has stopped all narcotics and is only using Tylenol for pain.    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  Yes    Pain Score  5    up to 8/10 at worst (typically at night)   Pain Location  Knee    Pain Orientation  Left    Pain Descriptors / Indicators  Aching;Tightness   "Stiffness"   Pain Type  Surgical pain    Pain Frequency  Intermittent         [facility] PT Assessment - 02/08/18 0924      Assessment   Medical Diagnosis  L TKA      AROM   Left Knee Flexion  79                   [facility] Adult PT Treatment/Exercise - 02/08/18 0924      Exercises   Exercises  Knee/Hip      Knee/Hip Exercises: Stretches   Gastroc Stretch  Left;30 seconds;3 reps    Gastroc Stretch Limitations  Prostretch      Knee/Hip Exercises: Aerobic   Recumbent Bike  6 min -  rocking for ROM      Knee/Hip Exercises: Machines for Strengthening   Cybex Leg Press  B LE 15# x20; ~5 sec pause at full knee flexion for stretch - ROM gradually improving as reps progressed      Knee/Hip Exercises: Supine   Short Arc Quad Sets  Left;15 reps;Strengthening    Short Arc Quad Sets Limitations  2# - 8" FR    Bridges  Both;10 reps    Straight Leg Raises  Left;10 reps;Strengthening    Straight Leg Raises Limitations  2# for 1st 8 reps, last 2 reps w/o weight d/t increased knee pain      Modalities   Modalities  Vasopneumatic      Vasopneumatic   Number Minutes Vasopneumatic   15 minutes    Vasopnuematic Location   Knee    Vasopneumatic Pressure  Medium;High    Vasopneumatic Temperature   Lowest      Manual Therapy   Manual Therapy  Joint mobilization    Manual therapy comments  supine & seated    Joint Mobilization  L patellar mobs - all directions, emphasis on sup/inf; L knee A/P  mobs in sitting with slight distraction - limited tolerance for PT's hand placement due to tenderness ove rproximal tibia               PT Short Term Goals - 02/08/18 0931      PT SHORT TERM GOAL #1   Title  Pt will be independent with initial HEP    Status  Achieved      PT SHORT TERM GOAL #2   Title  Pt will have 90 degrees of L knee AROM flexion    Status  On-going        PT Long Term Goals - 01/19/18 0939      PT LONG TERM GOAL #1   Title  Pt to be independent with advanced HEP    Status  On-going      PT LONG TERM GOAL #2   Title  Patient to demonstrate 0-120 degrees of AROM at L knee to improve gait and abilty to perform ADLs independently    Status  On-going      PT LONG TERM GOAL #3   Title  Patient to report ability to take care of animals and garden independently with no increased pain.     Status  On-going      PT LONG TERM GOAL #4   Title  Pt to report pain at worst 4/10 with activity     Status  On-going      PT LONG TERM GOAL #5   Title  Pt will demonstrate normal gait pattern with LRAD in order to improve functional mobility and decrease fall risk    Status  On-going      PT LONG TERM GOAL #6   Title  Pt will have LE strength globally of 4+/5 to improve functional mobility and perform ADLs and job-related activities safely    Status  On-going            Plan - 02/08/18 0931    Clinical Impression Statement  Darly noting improved pain and functional movement in L knee allowing for more normlaized gait pattern but still noting stiffness and increased pain esp at night limiting sleep. Pt currently relying only on Tylenol for pain control as prescription meds caused severe constipation from which her GI system has not yet full recovered. Therapy tolerance limited somewhat due  to increased pain with some exercises as well as tolerance for touch with manual therapy, thus only minor improvement noted in flexion ROM. STG for initial HEP met but ROM  goal ongoing.    Rehab Potential  Good    PT Treatment/Interventions  ADLs/Self Care Home Management;Cryotherapy;Electrical Stimulation;Iontophoresis 48m/ml Dexamethasone;Moist Heat;Ultrasound;DME Instruction;Gait training;Stair training;Functional mobility training;Therapeutic activities;Therapeutic exercise;Balance training;Neuromuscular re-education;Patient/family education;Manual techniques;Scar mobilization;Passive range of motion;Dry needling;Taping;Vasopneumatic Device    PT Next Visit Plan  Assess short-term goals    Consulted and Agree with Plan of Care  Patient       Patient will benefit from skilled therapeutic intervention in order to improve the following deficits and impairments:  Abnormal gait, Decreased skin integrity, Hypomobility, Impaired sensation, Increased edema, Decreased activity tolerance, Decreased strength, Increased fascial restricitons, Pain, Decreased mobility, Difficulty walking, Increased muscle spasms, Decreased range of motion, Improper body mechanics, Impaired flexibility, Decreased balance  Visit Diagnosis: Acute pain of left knee  Difficulty in walking, not elsewhere classified  Other symptoms and signs involving the musculoskeletal system  Stiffness of left knee, not elsewhere classified  Muscle weakness (generalized)  Other abnormalities of gait and mobility     Problem List Patient Active Problem List   Diagnosis Date Noted  . S/P left unicompartmental knee replacement 01/13/2018  . OA (osteoarthritis) of knee 01/21/2017  . Left knee pain 04/19/2014  . BRONCHITIS, ACUTE 07/21/2010    JPercival Spanish PT, MPT 02/08/2018, 12:21 PM  [facility] [place] [address]: [phone]  Fax:  [phone] Name: Joanna GILLHAMMRN: [id number] of Birth: [birth year]

## 2018-02-10 ENCOUNTER — Ambulatory Visit: Payer: 59

## 2018-02-10 DIAGNOSIS — R262 Difficulty in walking, not elsewhere classified: Secondary | ICD-10-CM | POA: Diagnosis not present

## 2018-02-10 DIAGNOSIS — M25562 Pain in left knee: Secondary | ICD-10-CM | POA: Diagnosis not present

## 2018-02-10 DIAGNOSIS — M25662 Stiffness of left knee, not elsewhere classified: Secondary | ICD-10-CM

## 2018-02-10 DIAGNOSIS — R2689 Other abnormalities of gait and mobility: Secondary | ICD-10-CM

## 2018-02-10 DIAGNOSIS — K219 Gastro-esophageal reflux disease without esophagitis: Secondary | ICD-10-CM | POA: Diagnosis not present

## 2018-02-10 DIAGNOSIS — R29898 Other symptoms and signs involving the musculoskeletal system: Secondary | ICD-10-CM | POA: Diagnosis not present

## 2018-02-10 DIAGNOSIS — M6281 Muscle weakness (generalized): Secondary | ICD-10-CM | POA: Diagnosis not present

## 2018-02-10 NOTE — Therapy (Signed)
[facility] [address]  [place], [place], [address] Phone: [phone]   Fax:  [phone]  Physical Therapy Treatment  Patient Details  Name: Joanna Willis MRN: [id number] Date of Birth: 12/04/1955 Referring Provider: Theresa Duty, PA-C   Encounter Date: 02/10/2018  PT End of Session - 02/10/18 1034    Visit Number  10    Number of Visits  18    Date for PT Re-Evaluation  03/01/18    Authorization Type  [facility]    PT Start Time  1016    PT Stop Time  1128    PT Time Calculation (min)  72 min    Activity Tolerance  Patient tolerated treatment well    Behavior During Therapy  [facility] for tasks assessed/performed       Past Medical History:  Diagnosis Date  . Arthritis   . Depression   . GERD (gastroesophageal reflux disease)   . Hypercholesteremia   . PONV (postoperative nausea and vomiting)   . Postmenopausal   . Wears glasses     Past Surgical History:  Procedure Laterality Date  . ABDOMINAL HYSTERECTOMY  2005   did not remove ovaries  . CESAREAN SECTION  2000  . COLONOSCOPY    . CONVERSION TO TOTAL KNEE Left 01/13/2018   Procedure: Revision of Left knee unicompartmental arthroplasty to total knee arthroplasty;  Surgeon: [name], MD;  Location: [facility];  Service: Orthopedics;  Laterality: Left;  . FOOT ARTHRODESIS  1995   right  . KNEE ARTHROSCOPY WITH MEDIAL MENISECTOMY Left 05/17/2014   Procedure: LEFT ARTHROSCOPY KNEE WITH PARTIAL MEDIAL MENISECTOMY, CHONDROPLASTY OF LATERAL FEMORAL CONDYLE;  Surgeon: Alta Corning, MD;  Location: [place];  Service: Orthopedics;  Laterality: Left;  . PARTIAL KNEE ARTHROPLASTY Left 01/21/2017   Procedure: UNICOMPARTMENTAL LEFT KNEE;  Surgeon: [name], MD;  Location[facility];  Service: Orthopedics;  Laterality: Left;  . Revision left knee     Dr. Wynelle Link 01-13-18    There were no vitals filed for this visit.  Subjective Assessment -  02/10/18 1022    Subjective  Pt. noting she is still taking only ibuprofen and tylenol for pain.      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  Yes    Pain Score  5     Pain Location  Knee    Pain Orientation  Left    Pain Descriptors / Indicators  Aching    Pain Type  Surgical pain    Pain Onset  1 to 4 weeks ago    Pain Frequency  Intermittent    Aggravating Factors   prolonged sitting     Pain Relieving Factors  medication     Multiple Pain Sites  No         [facility] PT Assessment - 02/10/18 1041      Observation/Other Assessments   Focus on Therapeutic Outcomes (FOTO)   33% (67% limited)      AROM   AROM Assessment Site  Knee    Right/Left Knee  Left    Left Knee Extension  6    Left Knee Flexion  80      PROM   PROM Assessment Site  Knee    Right/Left Knee  Left    Left Knee Extension  5    Left Knee Flexion  83  [facility] [facility] Adult PT Treatment/Exercise - 02/10/18 1057      Self-Care   Self-Care  Other Self-Care Comments    Other Self-Care Comments   Instructed in L patellar mobs for self-performance at home daily       Knee/Hip Exercises: Stretches   Sports administrator  Left;1 rep;60 seconds    Quad Stretch Limitations  prone with bolster under thigh and strap    Hip Flexor Stretch  Left;1 rep;60 seconds    Hip Flexor Stretch Limitations  in mod thomas position     Gastroc Stretch  Left;1 rep;60 seconds    Gastroc Stretch Limitations  Prostretch      Knee/Hip Exercises: Standing   Terminal Knee Extension  Left;Theraband;15 reps    Theraband Level (Terminal Knee Extension)  Level 3 (Green)    Terminal Knee Extension Limitations  standing with L Wt. shift     Wall Squat  10 reps;3 seconds   Cues required for proper weight shift    Wall Squat Limitations  "mini" to tolerance; leaning on orange p-ball on wall       Knee/Hip Exercises: Seated   Sit to Sand  15 reps   improved wt  distribution and control     Knee/Hip Exercises: Supine   Straight Leg Raises  Left;2 sets;10 reps    Straight Leg Raises Limitations  2#; 2nd set with yellow looped TB at ankles for HEP addition       Vasopneumatic   Number Minutes Vasopneumatic   15 minutes    Vasopnuematic Location   Knee    Vasopneumatic Pressure  Medium    Vasopneumatic Temperature   Lowest      Manual Therapy   Manual Therapy  Joint mobilization;Passive ROM    Manual therapy comments  supine     Joint Mobilization  L patellar mobs all directions (most limited inferior/superior), L knee A/P mobs for improved motion     Passive ROM  Manual L knee flexion stretching with therapist              PT Education - 02/10/18 1138    Education Details  HEP review:  yellow looped TB at ankles issued to pt. for SLR,  large green looped TB issued to pt. for standing TKE    Person(s) Educated  Patient    Methods  Explanation;Demonstration;Verbal cues;Handout    Comprehension  Verbalized understanding;Returned demonstration;Verbal cues required;Need further instruction       PT Short Term Goals - 02/10/18 1045      PT SHORT TERM GOAL #1   Title  Pt will be independent with initial HEP    Status  Achieved      PT SHORT TERM GOAL #2   Title  Pt will have 90 degrees of L knee AROM flexion    Status  On-going   6-80 dg        PT Long Term Goals - 02/10/18 1045      PT LONG TERM GOAL #1   Title  Pt to be independent with advanced HEP    Status  On-going      PT LONG TERM GOAL #2   Title  Patient to demonstrate 0-120 degrees of AROM at L knee to improve gait and abilty to perform ADLs independently    Status  On-going      PT LONG TERM GOAL #3   Title  Patient to report ability to take care of animals and garden independently with no increased  pain.     Status  On-going   Pt. noting unable to take care of garden at this time; able to perform 50% care of animals now     PT LONG TERM GOAL #4   Title  Pt  to report pain at worst 4/10 with activity     Status  On-going   up to 9/10      PT LONG TERM GOAL #5   Title  Pt will demonstrate normal gait pattern with LRAD in order to improve functional mobility and decrease fall risk    Status  On-going   Still ambulating with SPC with decreased L wt. shift, and decreased B heel strike      PT LONG TERM GOAL #6   Title  Pt will have LE strength globally of 4+/5 to improve functional mobility and perform ADLs and job-related activities safely    Status  On-going   not test at this time due to pain levels            Plan - 02/10/18 1034    Clinical Impression Statement  Pt. has made good progress with therapy.  Pt. able to demo improved gait mechanics today with improved wt. shift and heel strike.  Able to demo mild improvement in ROM since last visit now PROM 5-83 dg, AROM 6-80 dg today.  HEP updated with additional quad strengthening and stretching activities.  Will continue to progress toward goals.      PT Treatment/Interventions  ADLs/Self Care Home Management;Cryotherapy;Electrical Stimulation;Iontophoresis 4mg /ml Dexamethasone;Moist Heat;Ultrasound;DME Instruction;Gait training;Stair training;Functional mobility training;Therapeutic activities;Therapeutic exercise;Balance training;Neuromuscular re-education;Patient/family education;Manual techniques;Scar mobilization;Passive range of motion;Dry needling;Taping;Vasopneumatic Device    Consulted and Agree with Plan of Care  Patient       Patient will benefit from skilled therapeutic intervention in order to improve the following deficits and impairments:  Abnormal gait, Decreased skin integrity, Hypomobility, Impaired sensation, Increased edema, Decreased activity tolerance, Decreased strength, Increased fascial restricitons, Pain, Decreased mobility, Difficulty walking, Increased muscle spasms, Decreased range of motion, Improper body mechanics, Impaired flexibility, Decreased balance  Visit  Diagnosis: Acute pain of left knee  Difficulty in walking, not elsewhere classified  Other symptoms and signs involving the musculoskeletal system  Stiffness of left knee, not elsewhere classified  Muscle weakness (generalized)  Other abnormalities of gait and mobility     Problem List Patient Active Problem List   Diagnosis Date Noted  . S/P left unicompartmental knee replacement 01/13/2018  . OA (osteoarthritis) of knee 01/21/2017  . Left knee pain 04/19/2014  . BRONCHITIS, ACUTE 07/21/2010    Joanna Willis, PTA 02/10/18 11:45 AM   [facility] [address]  [place], [place], [address] Phone: [phone]   Fax:  [phone]  Name: Joanna Willis MRN: [id number] Date of Birth: 1956/06/08

## 2018-02-12 ENCOUNTER — Ambulatory Visit: Payer: 59

## 2018-02-12 DIAGNOSIS — R2689 Other abnormalities of gait and mobility: Secondary | ICD-10-CM | POA: Diagnosis not present

## 2018-02-12 DIAGNOSIS — R262 Difficulty in walking, not elsewhere classified: Secondary | ICD-10-CM

## 2018-02-12 DIAGNOSIS — M6281 Muscle weakness (generalized): Secondary | ICD-10-CM | POA: Diagnosis not present

## 2018-02-12 DIAGNOSIS — M25662 Stiffness of left knee, not elsewhere classified: Secondary | ICD-10-CM

## 2018-02-12 DIAGNOSIS — M25562 Pain in left knee: Secondary | ICD-10-CM | POA: Diagnosis not present

## 2018-02-12 DIAGNOSIS — R29898 Other symptoms and signs involving the musculoskeletal system: Secondary | ICD-10-CM | POA: Diagnosis not present

## 2018-02-12 DIAGNOSIS — K219 Gastro-esophageal reflux disease without esophagitis: Secondary | ICD-10-CM | POA: Diagnosis not present

## 2018-02-12 NOTE — Therapy (Signed)
[facility] [address]  [place], [place], [address] Phone: [phone]   Fax:  [phone]  Physical Therapy Treatment  Patient Details  Name: Joanna Willis MRN: [id number] Date of Birth: [birth year] Referring Provider: Theresa Duty, PA-C   Encounter Date: 02/12/2018  PT End of Session - 02/12/18 1023    Visit Number  11    Number of Visits  18    Date for PT Re-Evaluation  03/01/18    Authorization Type  [facility]    PT Start Time  1018    PT Stop Time  1112    PT Time Calculation (min)  54 min    Activity Tolerance  Patient tolerated treatment well    Behavior During Therapy  [facility] for tasks assessed/performed       Past Medical History:  Diagnosis Date  . Arthritis   . Depression   . GERD (gastroesophageal reflux disease)   . Hypercholesteremia   . PONV (postoperative nausea and vomiting)   . Postmenopausal   . Wears glasses     Past Surgical History:  Procedure Laterality Date  . ABDOMINAL HYSTERECTOMY  2005   did not remove ovaries  . CESAREAN SECTION  2000  . COLONOSCOPY    . CONVERSION TO TOTAL KNEE Left 01/13/2018   Procedure: Revision of Left knee unicompartmental arthroplasty to total knee arthroplasty;  Surgeon: [name], MD;  [facility];  Service: Orthopedics;  Laterality: Left;  . FOOT ARTHRODESIS  1995   right  . KNEE ARTHROSCOPY WITH MEDIAL MENISECTOMY Left 05/17/2014   Procedure: LEFT ARTHROSCOPY KNEE WITH PARTIAL MEDIAL MENISECTOMY, CHONDROPLASTY OF LATERAL FEMORAL CONDYLE;  Surgeon: Alta Corning, MD;  Location: [place];  Service: Orthopedics;  Laterality: Left;  . PARTIAL KNEE ARTHROPLASTY Left 01/21/2017   Procedure: UNICOMPARTMENTAL LEFT KNEE;  Surgeon: [name], MD;  [facility];  Service: Orthopedics;  Laterality: Left;  . Revision left knee     Dr. Wynelle Link 01-13-18    There were no vitals filed for this visit.  Subjective Assessment -  02/12/18 1021    Subjective  Pt. noting, "I've had four good days".      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  Yes    Pain Score  2     Pain Location  Knee    Pain Orientation  Left    Pain Descriptors / Indicators  Aching    Pain Type  Surgical pain    Aggravating Factors   prolonged sitting     Pain Relieving Factors  medication     Multiple Pain Sites  No                       [facility] Adult PT Treatment/Exercise - 02/12/18 1034      Ambulation/Gait   Ambulation/Gait  Yes    Ambulation/Gait Assistance  5: Supervision    Ambulation Distance (Feet)  250 Feet    Assistive device  Straight cane      Knee/Hip Exercises: Aerobic   Recumbent Bike  6 min - rocking for ROM      Knee/Hip Exercises: Standing   Lateral Step Up  Left;10 reps;Hand Hold: 2;Step Height: 6"    Lateral Step Up Limitations  2 ski poles     Forward Step Up  Left;10 reps;Hand Hold: 1;Step Height: 6"  Forward Step Up Limitations  VC's for technique      Knee/Hip Exercises: Seated   Other Seated Knee/Hip Exercises  L seated fitter leg press (1 blue, 1 black band) x 15 reps      Knee/Hip Exercises: Supine   Other Supine Knee/Hip Exercises  HS curl with heels on peanut p-ball x 15 reps       Vasopneumatic   Number Minutes Vasopneumatic   15 minutes    Vasopnuematic Location   Knee    Vasopneumatic Pressure  Medium    Vasopneumatic Temperature   Lowest      Manual Therapy   Manual Therapy  Joint mobilization    Joint Mobilization  L patellar mobs all directions (most limited inferior/superior), L knee A/P mobs for improved motion    with self-mob instruction               PT Short Term Goals - 02/10/18 1045      PT SHORT TERM GOAL #1   Title  Pt will be independent with initial HEP    Status  Achieved      PT SHORT TERM GOAL #2   Title  Pt will have 90 degrees of L knee AROM flexion    Status  On-going   6-80 dg         PT Long Term Goals - 02/10/18 1045      PT LONG TERM GOAL #1   Title  Pt to be independent with advanced HEP    Status  On-going      PT LONG TERM GOAL #2   Title  Patient to demonstrate 0-120 degrees of AROM at L knee to improve gait and abilty to perform ADLs independently    Status  On-going      PT LONG TERM GOAL #3   Title  Patient to report ability to take care of animals and garden independently with no increased pain.     Status  On-going   Pt. noting unable to take care of garden at this time; able to perform 50% care of animals now     PT LONG TERM GOAL #4   Title  Pt to report pain at worst 4/10 with activity     Status  On-going   up to 9/10      PT LONG TERM GOAL #5   Title  Pt will demonstrate normal gait pattern with LRAD in order to improve functional mobility and decrease fall risk    Status  On-going   Still ambulating with SPC with decreased L wt. shift, and decreased B heel strike      PT LONG TERM GOAL #6   Title  Pt will have LE strength globally of 4+/5 to improve functional mobility and perform ADLs and job-related activities safely    Status  On-going   not test at this time due to pain levels            Plan - 02/12/18 1024    Clinical Impression Statement  Join doing well today noting improvement in L knee pain levels over past few days.  Pt. tolerated addition of lateral step up, and progression of front step up well today.  Did require cueing with TKE strengthening activities in standing to isolate knee motion and ensure proper muscular activation.  Ended session with Ice/compression to L knee to decrease post-exercise soreness/swelling.      PT Treatment/Interventions  ADLs/Self Care Home Management;Cryotherapy;Electrical Stimulation;Iontophoresis 4mg /ml Dexamethasone;Moist Heat;Ultrasound;DME  Instruction;Gait training;Stair training;Functional mobility training;Therapeutic activities;Therapeutic exercise;Balance training;Neuromuscular  re-education;Patient/family education;Manual techniques;Scar mobilization;Passive range of motion;Dry needling;Taping;Vasopneumatic Device    Consulted and Agree with Plan of Care  Patient       Patient will benefit from skilled therapeutic intervention in order to improve the following deficits and impairments:  Abnormal gait, Decreased skin integrity, Hypomobility, Impaired sensation, Increased edema, Decreased activity tolerance, Decreased strength, Increased fascial restricitons, Pain, Decreased mobility, Difficulty walking, Increased muscle spasms, Decreased range of motion, Improper body mechanics, Impaired flexibility, Decreased balance  Visit Diagnosis: Acute pain of left knee  Difficulty in walking, not elsewhere classified  Other symptoms and signs involving the musculoskeletal system  Stiffness of left knee, not elsewhere classified  Muscle weakness (generalized)  Other abnormalities of gait and mobility     Problem List Patient Active Problem List   Diagnosis Date Noted  . S/P left unicompartmental knee replacement 01/13/2018  . OA (osteoarthritis) of knee 01/21/2017  . Left knee pain 04/19/2014  . BRONCHITIS, ACUTE 07/21/2010    Bess Harvest, PTA 02/12/18 12:15 PM   [facility] [address]  [place], [place], [address] Phone: [phone]   Fax:  [phone]  Name: Joanna Willis MRN: [id number] Date of Birth: 1955-07-13

## 2018-02-15 ENCOUNTER — Ambulatory Visit: Payer: 59 | Admitting: Physical Therapy

## 2018-02-15 DIAGNOSIS — M25662 Stiffness of left knee, not elsewhere classified: Secondary | ICD-10-CM

## 2018-02-15 DIAGNOSIS — R2689 Other abnormalities of gait and mobility: Secondary | ICD-10-CM | POA: Diagnosis not present

## 2018-02-15 DIAGNOSIS — M6281 Muscle weakness (generalized): Secondary | ICD-10-CM

## 2018-02-15 DIAGNOSIS — M25562 Pain in left knee: Secondary | ICD-10-CM | POA: Diagnosis not present

## 2018-02-15 DIAGNOSIS — R29898 Other symptoms and signs involving the musculoskeletal system: Secondary | ICD-10-CM | POA: Diagnosis not present

## 2018-02-15 DIAGNOSIS — K219 Gastro-esophageal reflux disease without esophagitis: Secondary | ICD-10-CM | POA: Diagnosis not present

## 2018-02-15 DIAGNOSIS — R262 Difficulty in walking, not elsewhere classified: Secondary | ICD-10-CM

## 2018-02-15 NOTE — Therapy (Signed)
[facility] [address]  [place], [place], [address] Phone: [phone]   Fax:  [phone]  Physical Therapy Treatment  Patient Details  Name: DEEANNE Willis MRN: [id number] Date of Birth: 1956-03-29 Referring Provider: Theresa Duty, PA-C for Joanna Arabian, MD   Encounter Date: 02/15/2018  PT End of Session - 02/15/18 0927    Visit Number  12    Number of Visits  18    Date for PT Re-Evaluation  03/01/18    Authorization Type  [facility]    PT Start Time  0927    PT Stop Time  1019    PT Time Calculation (min)  52 min    Activity Tolerance  Patient tolerated treatment well    Behavior During Therapy  [facility] for tasks assessed/performed       Past Medical History:  Diagnosis Date  . Arthritis   . Depression   . GERD (gastroesophageal reflux disease)   . Hypercholesteremia   . PONV (postoperative nausea and vomiting)   . Postmenopausal   . Wears glasses     Past Surgical History:  Procedure Laterality Date  . ABDOMINAL HYSTERECTOMY  2005   did not remove ovaries  . CESAREAN SECTION  2000  . COLONOSCOPY    . CONVERSION TO TOTAL KNEE Left 01/13/2018   Procedure: Revision of Left knee unicompartmental arthroplasty to total knee arthroplasty;  Surgeon: Joanna Arabian, MD;  [facility];  Service: Orthopedics;  Laterality: Left;  . FOOT ARTHRODESIS  1995   right  . KNEE ARTHROSCOPY WITH MEDIAL MENISECTOMY Left 05/17/2014   Procedure: LEFT ARTHROSCOPY KNEE WITH PARTIAL MEDIAL MENISECTOMY, CHONDROPLASTY OF LATERAL FEMORAL CONDYLE;  Surgeon: Alta Corning, MD;  Location: [place];  Service: Orthopedics;  Laterality: Left;  . PARTIAL KNEE ARTHROPLASTY Left 01/21/2017   Procedure: UNICOMPARTMENTAL LEFT KNEE;  Surgeon: Joanna Arabian, MD;  [facility];  Service: Orthopedics;  Laterality: Left;  . Revision left knee     Dr. Wynelle Link 01-13-18    There were no vitals filed for this  visit.  Subjective Assessment - 02/15/18 0929    Subjective  Pt reports she "felt like a truck hit me Sat" after her last therapy session, but states it was better by Sat night. Exercises seem to be getting easier/less painful.    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  No/denies    Pain Score  0-No pain   up to 3/10 with movement/exercise   Pain Location  Knee    Pain Orientation  Left    Pain Descriptors / Indicators  Aching;Tiring    Pain Type  Surgical pain    Pain Frequency  Intermittent         [facility] PT Assessment - 02/15/18 0370      Assessment   Medical Diagnosis  L TKA    Referring Provider  Joanna Duty, PA-C for Joanna Arabian, MD    Onset Date/Surgical Date  01/13/18    Next MD Visit  02/16/2018      AROM   Left Knee Extension  5   8 dg with LAQ   Left Knee Flexion  82      PROM   Left Knee Extension  3    Left Knee Flexion  85      Strength   Right Hip Flexion  4+/5  Right Hip Extension  4-/5    Right Hip ABduction  4+/5    Right Hip ADduction  4+/5    Left Hip Flexion  4-/5    Left Hip Extension  4-/5    Left Hip ABduction  4/5    Left Hip ADduction  4/5    Right Knee Flexion  5/5    Right Knee Extension  5/5    Left Knee Flexion  4-/5    Left Knee Extension  4-/5                   [facility] Adult PT Treatment/Exercise - 02/15/18 0927      Exercises   Exercises  Knee/Hip      Knee/Hip Exercises: Stretches   Quad Stretch  Left;30 seconds;2 reps    Sports administrator Limitations  prone - manual by PT with contract/relax - limited tolerance    Knee: Self-Stretch to increase Flexion  Left;5 reps;10 seconds    Knee: Self-Stretch Limitations  Step stretch with foot on BATCA seat in lowest position      Knee/Hip Exercises: Aerobic   Recumbent Bike  6 min - rocking for ROM      Knee/Hip Exercises: Machines for Strengthening   Cybex Knee Extension  B LE 10# x10; B con/L ecc 10#  x5, 5#  x5 - allowing for L knee flexion stretch with weight of bar btw reps    Cybex Knee Flexion  B LE 15# x10; B con/L ecc 10# x10      Knee/Hip Exercises: Standing   Heel Raises  Both;20 reps;3 seconds    Heel Raises Limitations  cues for quad & glute sets    Terminal Knee Extension  Left;15 reps;Theraband;Strengthening    Theraband Level (Terminal Knee Extension)  Level 4 (Blue)    Terminal Knee Extension Limitations  cues for quad & glute set, pushing heel down into floor    Functional Squat  15 reps;5 seconds    Functional Squat Limitations  TRX - cues for even weight shift       Manual Therapy   Manual Therapy  Joint mobilization    Manual therapy comments  supine    Joint Mobilization  L patellar mobs - all directions, emphasis on sup/inf; L knee A/P mobs for increased flexion/extension               PT Short Term Goals - 02/15/18 1000      PT SHORT TERM GOAL #1   Title  Pt will be independent with initial HEP    Status  Achieved      PT SHORT TERM GOAL #2   Title  Pt will have 90 degrees of L knee AROM flexion    Status  On-going        PT Long Term Goals - 02/15/18 1000      PT LONG TERM GOAL #1   Title  Pt to be independent with advanced HEP    Status  Partially Met      PT LONG TERM GOAL #2   Title  Patient to demonstrate 0-120 degrees of AROM at L knee to improve gait and abilty to perform ADLs independently    Status  On-going      PT LONG TERM GOAL #3   Title  Patient to report ability to take care of animals and garden independently with no increased pain.     Status  On-going      PT LONG  TERM GOAL #4   Title  Pt to report pain at worst 4/10 with activity     Status  On-going      PT LONG TERM GOAL #5   Title  Pt will demonstrate normal gait pattern with LRAD in order to improve functional mobility and decrease fall risk    Status  On-going      PT LONG TERM GOAL #6   Title  Pt will have LE strength globally of 4+/5 to improve functional  mobility and perform ADLs and job-related activities safely    Status  Partially Met            Plan - 02/15/18 0927    Clinical Impression Statement  Frenchie's initial therapy limited by high pain levels and limited tolerance for PT - pain gradually improving over past week, but therapy tolerance responding slowly with pt noting post-op therapy increased pain. L knee AROM 5-82 and PROM 3-85, with limited tolerance for joint mobilization and manual stretching/PROM. LE strength slowly improving but pt still noting episodes of L knee buckling at time while walking with SPC. She continues to have good potential to benefit from skilled PT to restore functional L knee ROM and LE strength to allow for normal gait and activity tolerance. Anticipate pt will likely require recert at end of current POC.    Rehab Potential  Good    PT Treatment/Interventions  ADLs/Self Care Home Management;Cryotherapy;Electrical Stimulation;Iontophoresis 67m/ml Dexamethasone;Moist Heat;Ultrasound;DME Instruction;Gait training;Stair training;Functional mobility training;Therapeutic activities;Therapeutic exercise;Balance training;Neuromuscular re-education;Patient/family education;Manual techniques;Scar mobilization;Passive range of motion;Dry needling;Taping;Vasopneumatic Device    Consulted and Agree with Plan of Care  Patient       Patient will benefit from skilled therapeutic intervention in order to improve the following deficits and impairments:  Abnormal gait, Decreased skin integrity, Hypomobility, Impaired sensation, Increased edema, Decreased activity tolerance, Decreased strength, Increased fascial restricitons, Pain, Decreased mobility, Difficulty walking, Increased muscle spasms, Decreased range of motion, Improper body mechanics, Impaired flexibility, Decreased balance  Visit Diagnosis: Acute pain of left knee  Difficulty in walking, not elsewhere classified  Other symptoms and signs involving the  musculoskeletal system  Stiffness of left knee, not elsewhere classified  Muscle weakness (generalized)  Other abnormalities of gait and mobility     Problem List Patient Active Problem List   Diagnosis Date Noted  . S/P left unicompartmental knee replacement 01/13/2018  . OA (osteoarthritis) of knee 01/21/2017  . Left knee pain 04/19/2014  . BRONCHITIS, ACUTE 07/21/2010    JPercival Spanish PT, MPT 02/15/2018, 12:48 PM  [facility] [place] [address]Phone: [phone]  Fax:  [phone] Name: Joanna FEDIEMRN: [id number] of Birth: 102/26/1957

## 2018-02-16 DIAGNOSIS — M1712 Unilateral primary osteoarthritis, left knee: Secondary | ICD-10-CM | POA: Diagnosis not present

## 2018-02-16 DIAGNOSIS — Z96652 Presence of left artificial knee joint: Secondary | ICD-10-CM | POA: Diagnosis not present

## 2018-02-17 ENCOUNTER — Ambulatory Visit: Payer: 59

## 2018-02-17 DIAGNOSIS — R29898 Other symptoms and signs involving the musculoskeletal system: Secondary | ICD-10-CM | POA: Diagnosis not present

## 2018-02-17 DIAGNOSIS — M25562 Pain in left knee: Secondary | ICD-10-CM

## 2018-02-17 DIAGNOSIS — R2689 Other abnormalities of gait and mobility: Secondary | ICD-10-CM | POA: Diagnosis not present

## 2018-02-17 DIAGNOSIS — M25662 Stiffness of left knee, not elsewhere classified: Secondary | ICD-10-CM | POA: Diagnosis not present

## 2018-02-17 DIAGNOSIS — K219 Gastro-esophageal reflux disease without esophagitis: Secondary | ICD-10-CM | POA: Diagnosis not present

## 2018-02-17 DIAGNOSIS — M6281 Muscle weakness (generalized): Secondary | ICD-10-CM

## 2018-02-17 DIAGNOSIS — R262 Difficulty in walking, not elsewhere classified: Secondary | ICD-10-CM

## 2018-02-17 NOTE — Therapy (Signed)
[facility] [address]  [place], [place], [address] Phone: [phone]   Fax:  [phone]  Physical Therapy Treatment  Patient Details  Name: Joanna Willis MRN: [id number] Date of Birth: [birth year] Referring Provider: Theresa Duty, PA-C for [name], MD   Encounter Date: 02/17/2018  PT End of Session - 02/17/18 1021    Visit Number  13    Number of Visits  18    Date for PT Re-Evaluation  03/01/18    Authorization Type  [facility]    PT Start Time  1015    PT Stop Time  1120    PT Time Calculation (min)  65 min    Activity Tolerance  Patient tolerated treatment well    Behavior During Therapy  [facility] for tasks assessed/performed       Past Medical History:  Diagnosis Date  . Arthritis   . Depression   . GERD (gastroesophageal reflux disease)   . Hypercholesteremia   . PONV (postoperative nausea and vomiting)   . Postmenopausal   . Wears glasses     Past Surgical History:  Procedure Laterality Date  . ABDOMINAL HYSTERECTOMY  2005   did not remove ovaries  . CESAREAN SECTION  2000  . COLONOSCOPY    . CONVERSION TO TOTAL KNEE Left 01/13/2018   Procedure: Revision of Left knee unicompartmental arthroplasty to total knee arthroplasty;  Surgeon: [name], MD;  Location: [facility];  Service: Orthopedics;  Laterality: Left;  . FOOT ARTHRODESIS  1995   right  . KNEE ARTHROSCOPY WITH MEDIAL MENISECTOMY Left 05/17/2014   Procedure: LEFT ARTHROSCOPY KNEE WITH PARTIAL MEDIAL MENISECTOMY, CHONDROPLASTY OF LATERAL FEMORAL CONDYLE;  Surgeon: Alta Corning, MD;  Location: [facility];  Service: Orthopedics;  Laterality: Left;  . PARTIAL KNEE ARTHROPLASTY Left 01/21/2017   Procedure: UNICOMPARTMENTAL LEFT KNEE;  Surgeon: [name], MD;  Location: [facility];  Service: Orthopedics;  Laterality: Left;  . Revision left knee     Dr. Wynelle Link 01-13-18    There were no vitals filed for this  visit.  Subjective Assessment - 02/17/18 1019    Subjective  Pt. noting, "My doctor gave me a 50/50 chance of getting a manipulation".      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  Yes    Pain Score  3     Pain Location  Knee    Pain Orientation  Left    Pain Descriptors / Indicators  Aching    Pain Type  Surgical pain    Pain Onset  More than a month ago    Pain Frequency  Intermittent    Multiple Pain Sites  No         [facility] PT Assessment - 02/17/18 1044      AROM   Left Knee Flexion  85      PROM   Left Knee Flexion  91                   [facility] Adult PT Treatment/Exercise - 02/17/18 1023      Self-Care   Self-Care  Other Self-Care Comments    Other Self-Care Comments   Instruction in roller stick to L lateral quad in area of tenderness       Knee/Hip Exercises: Stretches   Journalist, newspaper  Limitations  prone with strap     Hip Flexor Stretch  Left;1 rep;60 seconds    Hip Flexor Stretch Limitations  in mod thomas position       Knee/Hip Exercises: Aerobic   Recumbent Bike  7 min - rocking for ROM      Knee/Hip Exercises: Machines for Strengthening   Cybex Knee Flexion  B con/L ecc 15# x 12       Knee/Hip Exercises: Standing   Functional Squat  15 reps;5 seconds    Functional Squat Limitations  chair spoort      Vasopneumatic   Number Minutes Vasopneumatic   15 minutes    Vasopnuematic Location   Knee    Vasopneumatic Pressure  Medium    Vasopneumatic Temperature   Lowest      Manual Therapy   Manual Therapy  Joint mobilization    Manual therapy comments  supine    Joint Mobilization  L knee A/P with seat belt assistance for improved ROM; L knee patellar mobs in all directions for improved ROM                PT Short Term Goals - 02/15/18 1000      PT SHORT TERM GOAL #1   Title  Pt will be independent with initial HEP    Status  Achieved       PT SHORT TERM GOAL #2   Title  Pt will have 90 degrees of L knee AROM flexion    Status  On-going        PT Long Term Goals - 02/15/18 1000      PT LONG TERM GOAL #1   Title  Pt to be independent with advanced HEP    Status  Partially Met      PT LONG TERM GOAL #2   Title  Patient to demonstrate 0-120 degrees of AROM at L knee to improve gait and abilty to perform ADLs independently    Status  On-going      PT LONG TERM GOAL #3   Title  Patient to report ability to take care of animals and garden independently with no increased pain.     Status  On-going      PT LONG TERM GOAL #4   Title  Pt to report pain at worst 4/10 with activity     Status  On-going      PT LONG TERM GOAL #5   Title  Pt will demonstrate normal gait pattern with LRAD in order to improve functional mobility and decrease fall risk    Status  On-going      PT LONG TERM GOAL #6   Title  Pt will have LE strength globally of 4+/5 to improve functional mobility and perform ADLs and job-related activities safely    Status  Partially Met            Plan - 02/17/18 1022    Clinical Impression Statement  Pt. able to demo improvement in L knee flexion ROM today AROM flexion 85 dg and PROM flexion 91 dg following manual joint mobs, patellar mobs, and quad stretching.  Duration of session focused on HS strengthening and active flexion stretching with functional squat and lunge stretch at treadmill.  Ended visit with ice/compression to L knee to decrease post-exercise swelling and pain.      PT Treatment/Interventions  ADLs/Self Care Home Management;Cryotherapy;Electrical Stimulation;Iontophoresis 41m/ml Dexamethasone;Moist Heat;Ultrasound;DME Instruction;Gait training;Stair training;Functional mobility training;Therapeutic activities;Therapeutic exercise;Balance training;Neuromuscular re-education;Patient/family education;Manual techniques;Scar  mobilization;Passive range of motion;Dry needling;Taping;Vasopneumatic  Device    Consulted and Agree with Plan of Care  Patient       Patient will benefit from skilled therapeutic intervention in order to improve the following deficits and impairments:  Abnormal gait, Decreased skin integrity, Hypomobility, Impaired sensation, Increased edema, Decreased activity tolerance, Decreased strength, Increased fascial restricitons, Pain, Decreased mobility, Difficulty walking, Increased muscle spasms, Decreased range of motion, Improper body mechanics, Impaired flexibility, Decreased balance  Visit Diagnosis: Acute pain of left knee  Difficulty in walking, not elsewhere classified  Other symptoms and signs involving the musculoskeletal system  Stiffness of left knee, not elsewhere classified  Muscle weakness (generalized)  Other abnormalities of gait and mobility     Problem List Patient Active Problem List   Diagnosis Date Noted  . S/P left unicompartmental knee replacement 01/13/2018  . OA (osteoarthritis) of knee 01/21/2017  . Left knee pain 04/19/2014  . BRONCHITIS, ACUTE 07/21/2010    Bess Harvest, PTA 02/17/18 1:34 PM   [facility] [address]  [place], [place], [address] Phone: [phone]   Fax:  [phone]  Name: Joanna Willis MRN: [id number] Date of Birth: [birth year]

## 2018-02-19 ENCOUNTER — Encounter: Payer: Self-pay | Admitting: Physical Therapy

## 2018-02-19 ENCOUNTER — Ambulatory Visit: Payer: 59 | Admitting: Physical Therapy

## 2018-02-19 DIAGNOSIS — R262 Difficulty in walking, not elsewhere classified: Secondary | ICD-10-CM | POA: Diagnosis not present

## 2018-02-19 DIAGNOSIS — M25662 Stiffness of left knee, not elsewhere classified: Secondary | ICD-10-CM

## 2018-02-19 DIAGNOSIS — R29898 Other symptoms and signs involving the musculoskeletal system: Secondary | ICD-10-CM

## 2018-02-19 DIAGNOSIS — R2689 Other abnormalities of gait and mobility: Secondary | ICD-10-CM | POA: Diagnosis not present

## 2018-02-19 DIAGNOSIS — M25562 Pain in left knee: Secondary | ICD-10-CM

## 2018-02-19 DIAGNOSIS — M6281 Muscle weakness (generalized): Secondary | ICD-10-CM

## 2018-02-19 DIAGNOSIS — K219 Gastro-esophageal reflux disease without esophagitis: Secondary | ICD-10-CM | POA: Diagnosis not present

## 2018-02-19 NOTE — Therapy (Signed)
[facility] [address]  [place], [place], [address] Phone: [phone]   Fax:  [phone]  Physical Therapy Treatment  Patient Details  Name: Joanna Willis MRN: [id number] Date of Birth: Apr 30, 1956 Referring Provider: Theresa Duty, PA-C for [name], MD   Encounter Date: 02/19/2018  PT End of Session - 02/19/18 1015    Visit Number  14    Number of Visits  18    Date for PT Re-Evaluation  03/01/18    Authorization Type  [facility]    PT Start Time  1015    PT Stop Time  1114    PT Time Calculation (min)  59 min    Activity Tolerance  Patient tolerated treatment well    Behavior During Therapy  [facility] for tasks assessed/performed       Past Medical History:  Diagnosis Date  . Arthritis   . Depression   . GERD (gastroesophageal reflux disease)   . Hypercholesteremia   . PONV (postoperative nausea and vomiting)   . Postmenopausal   . Wears glasses     Past Surgical History:  Procedure Laterality Date  . ABDOMINAL HYSTERECTOMY  2005   did not remove ovaries  . CESAREAN SECTION  2000  . COLONOSCOPY    . CONVERSION TO TOTAL KNEE Left 01/13/2018   Procedure: Revision of Left knee unicompartmental arthroplasty to total knee arthroplasty;  Surgeon: [name], MD;  Location: [facility];  Service: Orthopedics;  Laterality: Left;  . FOOT ARTHRODESIS  1995   right  . KNEE ARTHROSCOPY WITH MEDIAL MENISECTOMY Left 05/17/2014   Procedure: LEFT ARTHROSCOPY KNEE WITH PARTIAL MEDIAL MENISECTOMY, CHONDROPLASTY OF LATERAL FEMORAL CONDYLE;  Surgeon: Alta Corning, MD;  Location: [facility];  Service: Orthopedics;  Laterality: Left;  . PARTIAL KNEE ARTHROPLASTY Left 01/21/2017   Procedure: UNICOMPARTMENTAL LEFT KNEE;  Surgeon: [name], MD;  Location: [facility];  Service: Orthopedics;  Laterality: Left;  . Revision left knee     Dr. Wynelle Link 01-13-18    There were no vitals filed for this  visit.  Subjective Assessment - 02/19/18 1016    Subjective  Pt reports she has started walking around the house w/o her cane. States her husband has noticed that she seems to be walking better.    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  Yes    Pain Score  2     Pain Location  Knee    Pain Orientation  Left    Pain Descriptors / Indicators  --   "Stiffness"   Pain Type  Surgical pain                       [facility] Adult PT Treatment/Exercise - 02/19/18 1015      Exercises   Exercises  Knee/Hip      Knee/Hip Exercises: Stretches   Hip Flexor Stretch  Left;30 seconds;3 reps    Hip Flexor Stretch Limitations  + quad in mod thomas    Knee: Self-Stretch to increase Flexion  Left;5 reps   15 sec   Knee: Self-Stretch Limitations  Step stretch with foot on BATCA seat in lowest position      Knee/Hip Exercises: Aerobic   Recumbent Bike  6 min - rocking for ROM      Knee/Hip Exercises: Standing   Step Down  Left;15 reps;Step  Height: 6";Hand Hold: 2    Step Down Limitations  eccentric lowering with heel touch      Modalities   Modalities  Cryotherapy;Electrical Stimulation      Cryotherapy   Number Minutes Cryotherapy  10 Minutes    Cryotherapy Location  Knee   Lt   Type of Cryotherapy  Ice pack      Electrical Stimulation   Electrical Stimulation Location  L quads    Electrical Stimulation Action  IFC    Electrical Stimulation Parameters  80-150 Hz, intensity to pt tolerance x 10'    Electrical Stimulation Goals  Pain;Tone      Manual Therapy   Manual Therapy  Soft tissue mobilization;Myofascial release;Taping    Manual therapy comments  supine & sitting    Soft tissue mobilization  L quads with emphasis on VL    Myofascial Release  CFM and myofscial release performed with light pressure to pt tolerance over L lateral quads    Kinesiotex  Inhibit Muscle      Kinesiotix   Inhibit Muscle   L quads - 30%  medially & 30-50% laterally applied in maximal available knee flexion       Trigger Point Dry Needling - 02/19/18 1015    Consent Given?  Yes    Education Handout Provided  Yes    Muscles Treated Lower Body  Quadriceps    Quadriceps Response  Twitch response elicited;Palpable increased muscle length   Lt VL            PT Short Term Goals - 02/15/18 1000      PT SHORT TERM GOAL #1   Title  Pt will be independent with initial HEP    Status  Achieved      PT SHORT TERM GOAL #2   Title  Pt will have 90 degrees of L knee AROM flexion    Status  On-going        PT Long Term Goals - 02/15/18 1000      PT LONG TERM GOAL #1   Title  Pt to be independent with advanced HEP    Status  Partially Met      PT LONG TERM GOAL #2   Title  Patient to demonstrate 0-120 degrees of AROM at L knee to improve gait and abilty to perform ADLs independently    Status  On-going      PT LONG TERM GOAL #3   Title  Patient to report ability to take care of animals and garden independently with no increased pain.     Status  On-going      PT LONG TERM GOAL #4   Title  Pt to report pain at worst 4/10 with activity     Status  On-going      PT LONG TERM GOAL #5   Title  Pt will demonstrate normal gait pattern with LRAD in order to improve functional mobility and decrease fall risk    Status  On-going      PT LONG TERM GOAL #6   Title  Pt will have LE strength globally of 4+/5 to improve functional mobility and perform ADLs and job-related activities safely    Status  Partially Met            Plan - 02/19/18 1019    Clinical Impression Statement  Llewellyn reporting improving stability and confidence with walking w/o AD, typically walking w/o SPC within home now. Also noting perception of improving motion in  L knee, but does note "bumps" in quads with rolling at home. Several TPs and taut bands identified in L quads, esp mid belly VL, which were addressed with manual therapy including DN  after informed verbal consent from pt - pt very sensitive with DN, esp more proximally, noting radicular pain down into knee during treatment which resolved after STM and stretching. Treatment concluded with estim and ice pack followed by kinesiotaping to quads to promote further pain and muscle tension reduction.    PT Treatment/Interventions  ADLs/Self Care Home Management;Cryotherapy;Electrical Stimulation;Iontophoresis 59m/ml Dexamethasone;Moist Heat;Ultrasound;DME Instruction;Gait training;Stair training;Functional mobility training;Therapeutic activities;Therapeutic exercise;Balance training;Neuromuscular re-education;Patient/family education;Manual techniques;Scar mobilization;Passive range of motion;Dry needling;Taping;Vasopneumatic Device    Consulted and Agree with Plan of Care  Patient       Patient will benefit from skilled therapeutic intervention in order to improve the following deficits and impairments:  Abnormal gait, Decreased skin integrity, Hypomobility, Impaired sensation, Increased edema, Decreased activity tolerance, Decreased strength, Increased fascial restricitons, Pain, Decreased mobility, Difficulty walking, Increased muscle spasms, Decreased range of motion, Improper body mechanics, Impaired flexibility, Decreased balance  Visit Diagnosis: Acute pain of left knee  Difficulty in walking, not elsewhere classified  Other symptoms and signs involving the musculoskeletal system  Stiffness of left knee, not elsewhere classified  Muscle weakness (generalized)  Other abnormalities of gait and mobility     Problem List Patient Active Problem List   Diagnosis Date Noted  . S/P left unicompartmental knee replacement 01/13/2018  . OA (osteoarthritis) of knee 01/21/2017  . Left knee pain 04/19/2014  . BRONCHITIS, ACUTE 07/21/2010    JPercival Spanish PT, MPT 02/19/2018, 1:12 PM  [facility]  [place] [address]Phone: [phone]  Fax:  [phone] Name: Joanna RUDDELLMRN: [id number] of Birth: 1September 16, 1957

## 2018-02-19 NOTE — Patient Instructions (Signed)

## 2018-02-23 ENCOUNTER — Ambulatory Visit: Payer: 59 | Attending: Student | Admitting: Physical Therapy

## 2018-02-23 ENCOUNTER — Encounter: Payer: Self-pay | Admitting: Physical Therapy

## 2018-02-23 DIAGNOSIS — R29898 Other symptoms and signs involving the musculoskeletal system: Secondary | ICD-10-CM | POA: Diagnosis not present

## 2018-02-23 DIAGNOSIS — M6281 Muscle weakness (generalized): Secondary | ICD-10-CM | POA: Diagnosis not present

## 2018-02-23 DIAGNOSIS — R2689 Other abnormalities of gait and mobility: Secondary | ICD-10-CM | POA: Insufficient documentation

## 2018-02-23 DIAGNOSIS — M25562 Pain in left knee: Secondary | ICD-10-CM | POA: Insufficient documentation

## 2018-02-23 DIAGNOSIS — R262 Difficulty in walking, not elsewhere classified: Secondary | ICD-10-CM | POA: Diagnosis not present

## 2018-02-23 DIAGNOSIS — M25662 Stiffness of left knee, not elsewhere classified: Secondary | ICD-10-CM | POA: Insufficient documentation

## 2018-02-23 IMAGING — MR MR KNEE*L* W/O CM
6 series · 40 of 40 positions shown · non-contrast
Comparison: MRI left knee 05/06/2014.

CLINICAL DATA: Medial left knee pain and swelling for 1 year.
History of prior surgery. Subsequent encounter.

EXAM:
MRI OF THE LEFT KNEE WITHOUT CONTRAST
TECHNIQUE: Multiplanar, multisequence MR imaging of the knee was performed. No
intravenous contrast was administered.

[Series 3: PD fat-sat · axial · 4.0mm · 0.50mm/px · z∈[-99,+11]mm · 7 of 23 slices shown (1 of 3)]
[im 1/23]
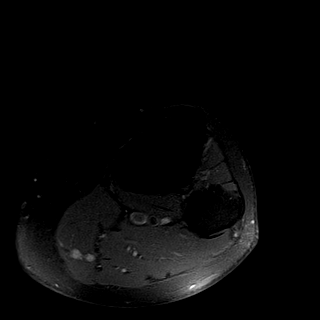
[im 4/23]
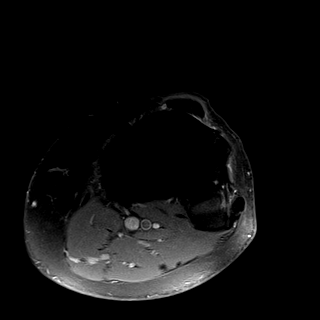
[im 8/23]
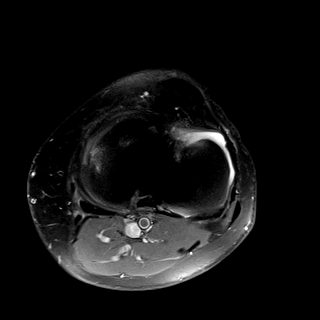
[im 12/23]
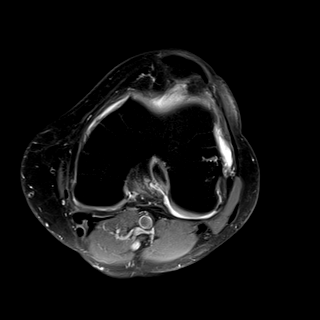
[im 15/23]
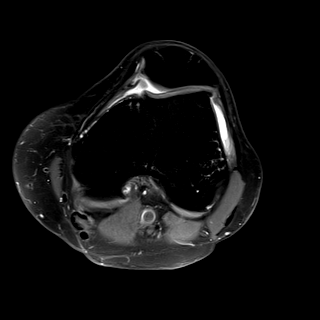
[im 19/23]
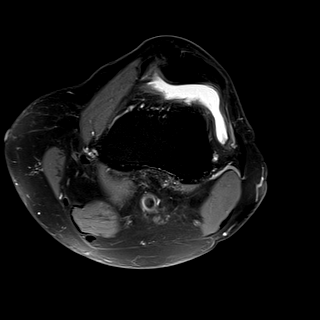
[im 23/23]
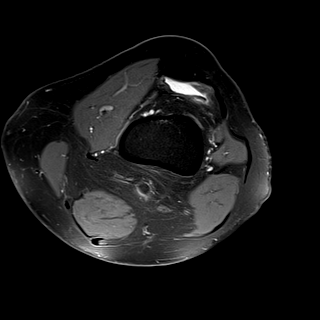

[Series 4: PD fat-sat · coronal · 4.0mm · 0.50mm/px · 7 of 23 slices shown (2 of 3)]
[im 1/23]
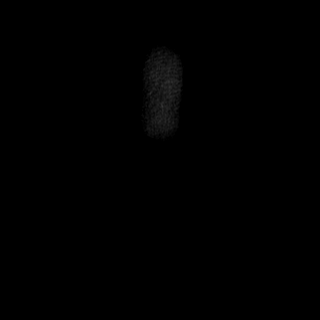
[im 4/23]
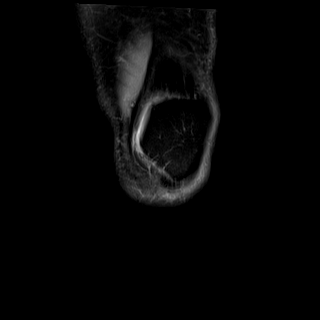
[im 8/23]
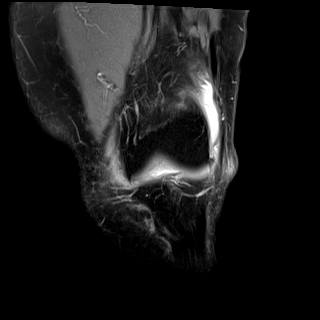
[im 12/23]
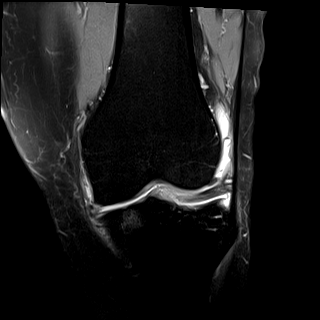
[im 15/23]
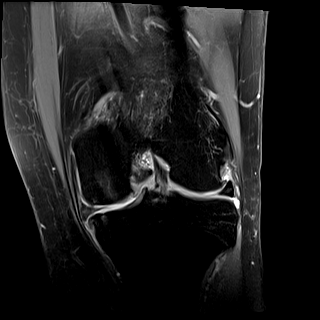
[im 19/23]
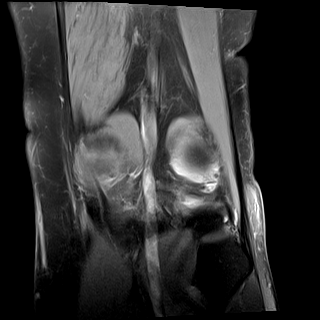
[im 23/23]
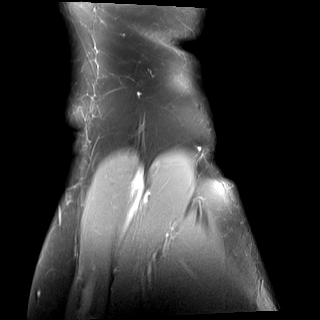

[Series 5: PD fat-sat · sagittal · 4.0mm · 0.50mm/px · 7 of 24 slices shown (3 of 3)]
[im 1/24]
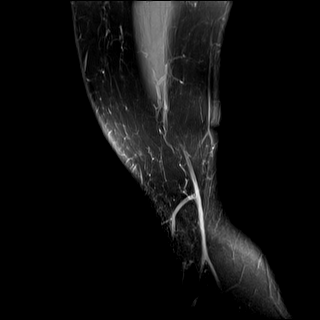
[im 4/24]
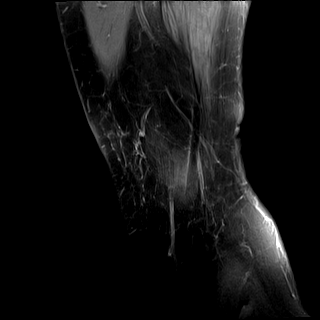
[im 8/24]
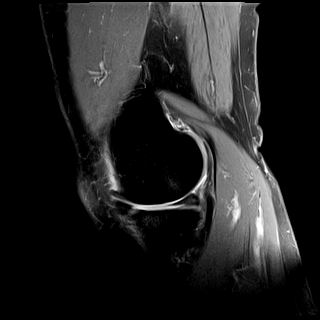
[im 12/24]
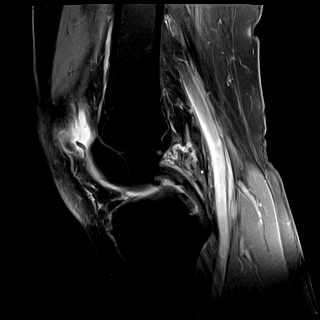
[im 16/24]
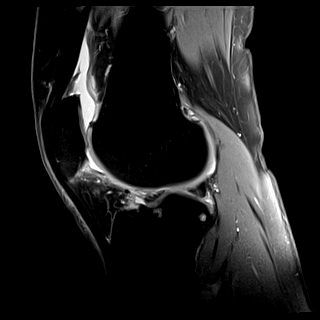
[im 20/24]
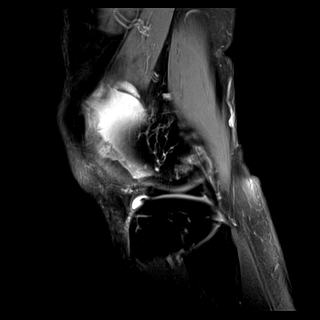
[im 24/24]
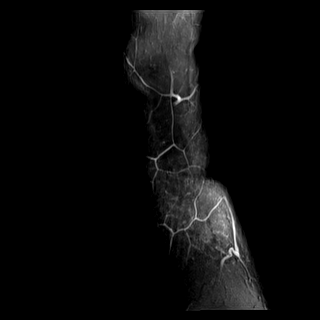

[Series 6: T2 fat-sat · coronal · 4.0mm · 0.50mm/px · 7 of 23 slices shown]
[im 1/23]
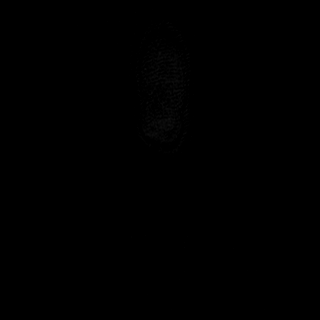
[im 4/23]
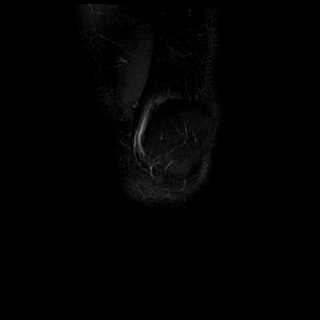
[im 8/23]
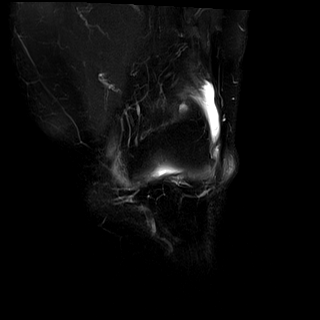
[im 12/23]
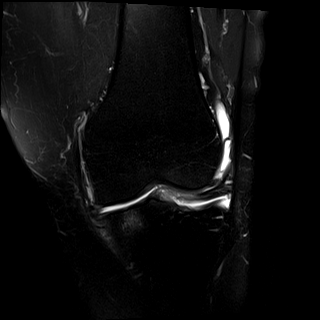
[im 15/23]
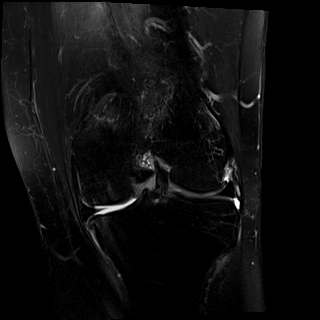
[im 19/23]
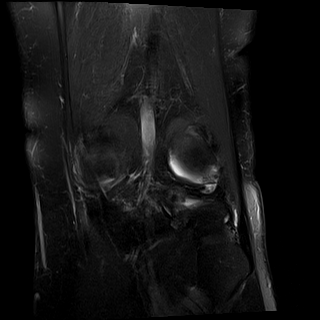
[im 23/23]
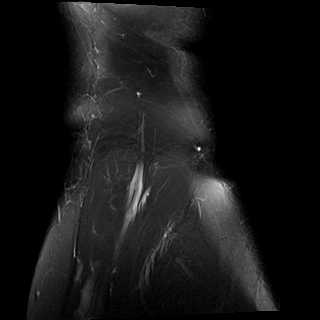

[Series 7: T1 · coronal · 4.0mm · 0.62mm/px · 7 of 23 slices shown]
[im 1/23]
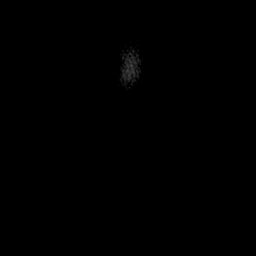
[im 4/23]
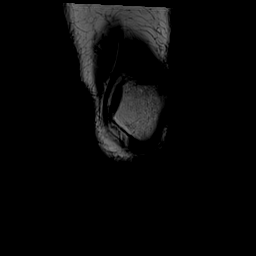
[im 8/23]
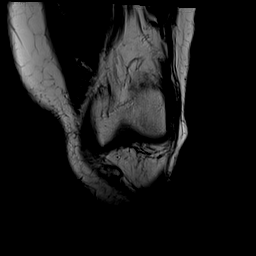
[im 12/23]
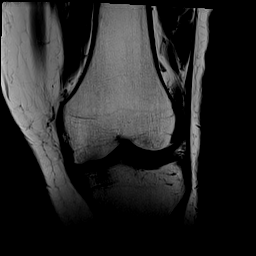
[im 15/23]
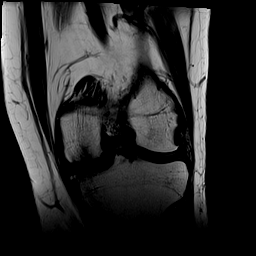
[im 19/23]
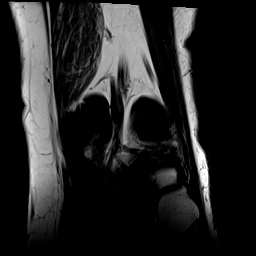
[im 23/23]
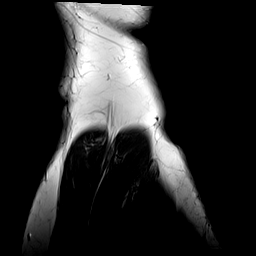

[Series 8: PD · coronal · 2.0mm · 0.50mm/px · 5 of 15 slices shown]
[im 1/15]
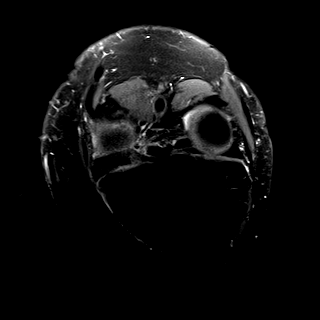
[im 4/15]
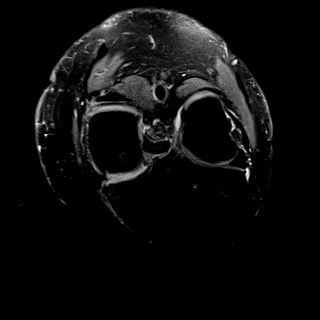
[im 8/15]
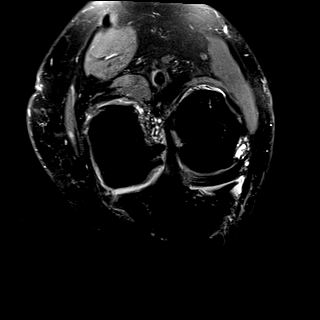
[im 11/15]
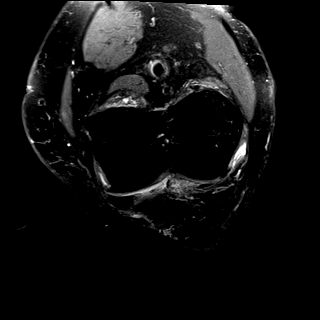
[im 15/15]
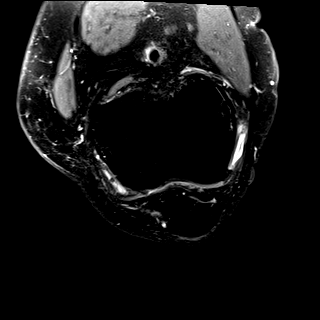

[40 of 40 positions shown; findings below may reference images not displayed]

FINDINGS: MENISCI

Medial meniscus: Previously seen avulsion at the root of the
posterior horn of the medial meniscus appears to have been with
paired. The body of the medial meniscus is markedly diminutive
likely reflecting postoperative change. No definite tear is seen.

Lateral meniscus:  Intact.

LIGAMENTS

Cruciates:  Intact.

Collaterals:  Intact.

CARTILAGE

Patellofemoral:  Mildly degenerated.  Small medial plica is seen.

Medial: There has been progressive cartilage thinning about the
medial compartment with associated joint space narrowing.

Lateral:  Unremarkable.

Joint: Small joint effusion. Small loose bodies anterior to the
medial tibial plateau measuring approximately 1.3 cm in diameter
again seen.

Popliteal Fossa:  No Baker's cyst.

Extensor Mechanism:  Intact.

Bones: Small osteophytes are present about the knee and have
progressed medially. There is also some subchondral edema about the
medial compartment which has progressed since the prior study. No
fracture or worrisome marrow lesion.
IMPRESSION: Appearance of the medial meniscus most consistent with postoperative
change. No meniscal tear identified.

Progressive osteoarthritis about the medial compartment of the knee.

## 2018-02-23 NOTE — Therapy (Addendum)
[facility] [place] [address]  [place], [place], [address] Phone: [phone]   Fax:  [phone]  Physical Therapy Treatment  Patient Details  Name: Joanna Willis MRN: [id number] Date of Birth: 1955/11/10 Referring Provider: Theresa Duty, PA-C for Joanna Arabian, MD   Encounter Date: 02/23/2018  PT End of Session - 02/23/18 0803    Visit Number  15    Number of Visits  18    Date for PT Re-Evaluation  03/01/18    Authorization Type  [facility]    PT Start Time  0803    PT Stop Time  0855    PT Time Calculation (min)  52 min    Activity Tolerance  Patient tolerated treatment well    Behavior During Therapy  Joanna Willis for tasks assessed/performed       Past Medical History:  Diagnosis Date  . Arthritis   . Depression   . GERD (gastroesophageal reflux disease)   . Hypercholesteremia   . PONV (postoperative nausea and vomiting)   . Postmenopausal   . Wears glasses     Past Surgical History:  Procedure Laterality Date  . ABDOMINAL HYSTERECTOMY  2005   did not remove ovaries  . CESAREAN SECTION  2000  . COLONOSCOPY    . CONVERSION TO TOTAL KNEE Left 01/13/2018   Procedure: Revision of Left knee unicompartmental arthroplasty to total knee arthroplasty;  Surgeon: Joanna Arabian, MD;  [facility];  Service: Orthopedics;  Laterality: Left;  . FOOT ARTHRODESIS  1995   right  . KNEE ARTHROSCOPY WITH MEDIAL MENISECTOMY Left 05/17/2014   Procedure: LEFT ARTHROSCOPY KNEE WITH PARTIAL MEDIAL MENISECTOMY, CHONDROPLASTY OF LATERAL FEMORAL CONDYLE;  Surgeon: Alta Corning, MD;  Location: [facility];  Service: Orthopedics;  Laterality: Left;  . PARTIAL KNEE ARTHROPLASTY Left 01/21/2017   Procedure: UNICOMPARTMENTAL LEFT KNEE;  Surgeon: Joanna Arabian, MD;  Location[facility];  Service: Orthopedics;  Laterality: Left;  . Revision left knee     Dr. Wynelle Link 01-13-18    There were no vitals filed for this  visit.  Subjective Assessment - 02/23/18 0807    Subjective  Pt reporting increased discomfort following DN last visit which necessitated taking muscle relaxants. States she has weaned herself off the cane over the weekend.    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  No/denies    Pain Score  0-No pain   up to 2-3 while warming up on bike   Pain Location  Knee    Pain Orientation  Left         [facility] PT Assessment - 02/23/18 0803      AROM   Left Knee Extension  1    Left Knee Flexion  91      PROM   Left Knee Extension  0    Left Knee Flexion  95                   [facility] Adult PT Treatment/Exercise - 02/23/18 0803      Exercises   Exercises  Knee/Hip      Knee/Hip Exercises: Aerobic   Recumbent Bike  6 min - rocking for ROM, occasional full revolutions with hip hike      Knee/Hip Exercises: Seated   Hamstring Curl  Left;15 reps;Strengthening    Hamstring Limitations  green TB  Knee/Hip Exercises: Supine   Bridges  Both;10 reps;2 sets    Bridges Limitations  2nd set on heels for increased HS activation    Knee Flexion  Both;15 reps;Left;AAROM    Knee Flexion Limitations  heels on peanut ball      Modalities   Modalities  Vasopneumatic      Vasopneumatic   Number Minutes Vasopneumatic   10 minutes    Vasopnuematic Location   Knee    Vasopneumatic Pressure  High    Vasopneumatic Temperature   Lowest      Manual Therapy   Manual Therapy  Soft tissue mobilization;Joint mobilization    Manual therapy comments  supine    Joint Mobilization  L patellar mobs - all directions, emphasis on sup/inf; L knee A/P mobs for increased flexion/extension    Soft tissue mobilization  L med HS & gastroc               PT Short Term Goals - 02/23/18 0845      PT SHORT TERM GOAL #1   Title  Pt will be independent with initial HEP    Status  Achieved      PT SHORT TERM GOAL #2   Title  Pt will have  90 degrees of L knee AROM flexion    Status  Achieved        PT Long Term Goals - 02/15/18 1000      PT LONG TERM GOAL #1   Title  Pt to be independent with advanced HEP    Status  Partially Met      PT LONG TERM GOAL #2   Title  Patient to demonstrate 0-120 degrees of AROM at L knee to improve gait and abilty to perform ADLs independently    Status  On-going      PT LONG TERM GOAL #3   Title  Patient to report ability to take care of animals and garden independently with no increased pain.     Status  On-going      PT LONG TERM GOAL #4   Title  Pt to report pain at worst 4/10 with activity     Status  On-going      PT LONG TERM GOAL #5   Title  Pt will demonstrate normal gait pattern with LRAD in order to improve functional mobility and decrease fall risk    Status  On-going      PT LONG TERM GOAL #6   Title  Pt will have LE strength globally of 4+/5 to improve functional mobility and perform ADLs and job-related activities safely    Status  Partially Met            Plan - 02/23/18 0810    Clinical Impression Statement  Joanna Willis able to wean self from [facility] over the weekend and reports no episodes of knee instability/buckling. Also notig decrease in quad muscle tension with complete resolution of area of distal TPs/taut bands where DN was focused last session, with lessening of proximal tightness as well. Pt still noting med/lat muscle spasms at L posterior knee but unable to determine if spasms are occuring in HS or gastroc - ttp appears to indicate more HS involvement, however at least 1 TP identified in proximal medial gastroc - address with STM, but requesting to defer DN at this time. L knee ROM overall imporved today with AROM 1-91 & PROM 0-95, meeting remaining STG. Pt will continue to benefit from skilled PT to maximize  functional L knee ROM and LE strength to allow return to PLOF including return to work.    Rehab Potential  Good    PT Treatment/Interventions  ADLs/Self  Care Home Management;Cryotherapy;Electrical Stimulation;Iontophoresis 53m/ml Dexamethasone;Moist Heat;Ultrasound;DME Instruction;Gait training;Stair training;Functional mobility training;Therapeutic activities;Therapeutic exercise;Balance training;Neuromuscular re-education;Patient/family education;Manual techniques;Scar mobilization;Passive range of motion;Dry needling;Taping;Vasopneumatic Device    Consulted and Agree with Plan of Care  Patient       Patient will benefit from skilled therapeutic intervention in order to improve the following deficits and impairments:  Abnormal gait, Decreased skin integrity, Hypomobility, Impaired sensation, Increased edema, Decreased activity tolerance, Decreased strength, Increased fascial restricitons, Pain, Decreased mobility, Difficulty walking, Increased muscle spasms, Decreased range of motion, Improper body mechanics, Impaired flexibility, Decreased balance  Visit Diagnosis: Acute pain of left knee  Difficulty in walking, not elsewhere classified  Other symptoms and signs involving the musculoskeletal system  Stiffness of left knee, not elsewhere classified  Muscle weakness (generalized)  Other abnormalities of gait and mobility     Problem List Patient Active Problem List   Diagnosis Date Noted  . S/P left unicompartmental knee replacement 01/13/2018  . OA (osteoarthritis) of knee 01/21/2017  . Left knee pain 04/19/2014  . BRONCHITIS, ACUTE 07/21/2010    JPercival Spanish PT, MPT 02/23/2018, 1:16 PM  [facility] [place] [address]Phone: [phone]  Fax:  [phone] Name: JDELLIE PIASECKIMRN: [id number] of Birth: [birth year]

## 2018-02-24 ENCOUNTER — Ambulatory Visit: Payer: 59

## 2018-02-25 ENCOUNTER — Ambulatory Visit: Payer: 59 | Admitting: Physical Therapy

## 2018-02-25 ENCOUNTER — Encounter

## 2018-02-25 ENCOUNTER — Encounter: Payer: Self-pay | Admitting: Physical Therapy

## 2018-02-25 DIAGNOSIS — R262 Difficulty in walking, not elsewhere classified: Secondary | ICD-10-CM

## 2018-02-25 DIAGNOSIS — M25562 Pain in left knee: Secondary | ICD-10-CM

## 2018-02-25 DIAGNOSIS — R29898 Other symptoms and signs involving the musculoskeletal system: Secondary | ICD-10-CM

## 2018-02-25 DIAGNOSIS — M6281 Muscle weakness (generalized): Secondary | ICD-10-CM | POA: Diagnosis not present

## 2018-02-25 DIAGNOSIS — R2689 Other abnormalities of gait and mobility: Secondary | ICD-10-CM

## 2018-02-25 DIAGNOSIS — M25662 Stiffness of left knee, not elsewhere classified: Secondary | ICD-10-CM | POA: Diagnosis not present

## 2018-02-25 NOTE — Therapy (Signed)
[place] [address]  [place], [place], [address] Phone: [phone]   Fax:  [phone]  Physical Therapy Treatment  Patient Details  Name: Joanna Willis MRN: [id number] Date of Birth: [birth year] Referring Provider: Theresa Duty, PA-C for Joanna Arabian, MD   Encounter Date: 02/25/2018  PT End of Session - 02/25/18 0938    Visit Number  16    Number of Visits  18    Date for PT Re-Evaluation  03/01/18    Authorization Type  [facility]    PT Start Time  0938    PT Stop Time  1020    PT Time Calculation (min)  42 min    Activity Tolerance  Patient tolerated treatment well    Behavior During Therapy  [facility] for tasks assessed/performed       Past Medical History:  Diagnosis Date  . Arthritis   . Depression   . GERD (gastroesophageal reflux disease)   . Hypercholesteremia   . PONV (postoperative nausea and vomiting)   . Postmenopausal   . Wears glasses     Past Surgical History:  Procedure Laterality Date  . ABDOMINAL HYSTERECTOMY  2005   did not remove ovaries  . CESAREAN SECTION  2000  . COLONOSCOPY    . CONVERSION TO TOTAL KNEE Left 01/13/2018   Procedure: Revision of Left knee unicompartmental arthroplasty to total knee arthroplasty;  Surgeon: Joanna Arabian, MD;  [facility];  Service: Orthopedics;  Laterality: Left;  . FOOT ARTHRODESIS  1995   right  . KNEE ARTHROSCOPY WITH MEDIAL MENISECTOMY Left 05/17/2014   Procedure: LEFT ARTHROSCOPY KNEE WITH PARTIAL MEDIAL MENISECTOMY, CHONDROPLASTY OF LATERAL FEMORAL CONDYLE;  Surgeon: Joanna Corning, MD;  Location: [facility];  Service: Orthopedics;  Laterality: Left;  . PARTIAL KNEE ARTHROPLASTY Left 01/21/2017   Procedure: UNICOMPARTMENTAL LEFT KNEE;  Surgeon: Joanna Arabian, MD;  [facility];  Service: Orthopedics;  Laterality: Left;  . Revision left knee     Joanna Willis 01-13-18    There were no vitals filed for this  visit.  Subjective Assessment - 02/25/18 0946    Subjective  Pt feeling like she is better able to bedn her knee to get her foot under her in sitting.    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  No/denies         [facility] PT Assessment - 02/25/18 0938      AROM   Left Knee Flexion  95                   [facility] Adult PT Treatment/Exercise - 02/25/18 0938      Exercises   Exercises  Knee/Hip      Knee/Hip Exercises: Stretches   Gastroc Stretch  Left;30 seconds;2 reps    Gastroc Stretch Limitations  standing gastroc stretch & DF stretch with toes on baseboard - 2 reps each    Soleus Stretch  Left;30 seconds;2 reps    Soleus Stretch Limitations  standing      Knee/Hip Exercises: Aerobic   Recumbent Bike  6 min - rocking for ROM, occasional full revolutions with hip hike      Knee/Hip Exercises: Standing   Hip Flexion  Right;Left;10 reps;Knee straight;Stengthening    Hip Flexion Limitations  red TB at ankle, 1 pole A    Side Lunges  Right;Left;10 reps;3 seconds  Side Lunges Limitations  TRX    Hip ADduction  Right;Left;10 reps;Strengthening    Hip ADduction Limitations  red TB at ankle, 1 pole A    Hip Abduction  Right;Left;10 reps;Knee straight    Abduction Limitations  red TB at ankle, 1 pole A    Hip Extension  Right;Left;Knee straight;Stengthening    Extension Limitations  red TB at ankle, 1 pole A    Functional Squat  15 reps;5 seconds    Functional Squat Limitations  TRX - triple extension      Knee/Hip Exercises: Seated   Hamstring Curl  Left;15 reps;Strengthening    Hamstring Limitations  looped red TB with R foot propped on stool for anchor      Manual Therapy   Manual Therapy  Joint mobilization    Manual therapy comments  hooklying    Joint Mobilization  L inferiror patellar mobs + tibiofemoral A/P & P/A mobs for increased flexion ROM               PT Short Term Goals - 02/23/18 0845       PT SHORT TERM GOAL #1   Title  Pt will be independent with initial HEP    Status  Achieved      PT SHORT TERM GOAL #2   Title  Pt will have 90 degrees of L knee AROM flexion    Status  Achieved        PT Long Term Goals - 02/15/18 1000      PT LONG TERM GOAL #1   Title  Pt to be independent with advanced HEP    Status  Partially Met      PT LONG TERM GOAL #2   Title  Patient to demonstrate 0-120 degrees of AROM at L knee to improve gait and abilty to perform ADLs independently    Status  On-going      PT LONG TERM GOAL #3   Title  Patient to report ability to take care of animals and garden independently with no increased pain.     Status  On-going      PT LONG TERM GOAL #4   Title  Pt to report pain at worst 4/10 with activity     Status  On-going      PT LONG TERM GOAL #5   Title  Pt will demonstrate normal gait pattern with LRAD in order to improve functional mobility and decrease fall risk    Status  On-going      PT LONG TERM GOAL #6   Title  Pt will have LE strength globally of 4+/5 to improve functional mobility and perform ADLs and job-related activities safely    Status  Partially Met            Plan - 02/25/18 1009    Clinical Impression Statement  Joanna Willis able to maintain ROM gains from last session on arrival to PT today, therefore more emphasis on strengthening activities today. Pt states posterior knee pain noted last session now more localized to medial gastroc but not noticing the cramping as much - reviewed options for gastroc/soleus stretches with pt able to perform good return demonstration. Good tolerance for exercise progression with pt aware of related updates/modifications to HEP. Treatment concluded with manual therapy to promote continued gains in flexion ROM with pt able to achieve 95 dg AROM in flexion by end of session. Pt declining cryotherapy today, opting to ice when she gets home.  Rehab Potential  Good    PT Treatment/Interventions   ADLs/Self Care Home Management;Cryotherapy;Electrical Stimulation;Iontophoresis 69m/ml Dexamethasone;Moist Heat;Ultrasound;DME Instruction;Gait training;Stair training;Functional mobility training;Therapeutic activities;Therapeutic exercise;Balance training;Neuromuscular re-education;Patient/family education;Manual techniques;Scar mobilization;Passive range of motion;Dry needling;Taping;Vasopneumatic Device    Consulted and Agree with Plan of Care  Patient       Patient will benefit from skilled therapeutic intervention in order to improve the following deficits and impairments:  Abnormal gait, Decreased skin integrity, Hypomobility, Impaired sensation, Increased edema, Decreased activity tolerance, Decreased strength, Increased fascial restricitons, Pain, Decreased mobility, Difficulty walking, Increased muscle spasms, Decreased range of motion, Improper body mechanics, Impaired flexibility, Decreased balance  Visit Diagnosis: Acute pain of left knee  Difficulty in walking, not elsewhere classified  Other symptoms and signs involving the musculoskeletal system  Stiffness of left knee, not elsewhere classified  Muscle weakness (generalized)  Other abnormalities of gait and mobility     Problem List Patient Active Problem List   Diagnosis Date Noted  . S/P left unicompartmental knee replacement 01/13/2018  . OA (osteoarthritis) of knee 01/21/2017  . Left knee pain 04/19/2014  . BRONCHITIS, ACUTE 07/21/2010    Joanna Willis PT, MPT 02/25/2018, 10:27 AM  [facility] [place] [address]Phone: [phone]  Fax:  [phone] Name: JKELANI ROBARTMRN: [id number] of Birth: [birth year]

## 2018-03-01 ENCOUNTER — Encounter: Payer: Self-pay | Admitting: Physical Therapy

## 2018-03-01 ENCOUNTER — Ambulatory Visit: Payer: 59 | Admitting: Physical Therapy

## 2018-03-01 DIAGNOSIS — M25562 Pain in left knee: Secondary | ICD-10-CM | POA: Diagnosis not present

## 2018-03-01 DIAGNOSIS — R262 Difficulty in walking, not elsewhere classified: Secondary | ICD-10-CM

## 2018-03-01 DIAGNOSIS — R2689 Other abnormalities of gait and mobility: Secondary | ICD-10-CM | POA: Diagnosis not present

## 2018-03-01 DIAGNOSIS — M6281 Muscle weakness (generalized): Secondary | ICD-10-CM

## 2018-03-01 DIAGNOSIS — R29898 Other symptoms and signs involving the musculoskeletal system: Secondary | ICD-10-CM

## 2018-03-01 DIAGNOSIS — M25662 Stiffness of left knee, not elsewhere classified: Secondary | ICD-10-CM | POA: Diagnosis not present

## 2018-03-01 NOTE — Therapy (Signed)
[facility] [place] [facility] [address]  [address] [place], [place], [address] Phone: [phone]   Fax:  [phone]  Physical Therapy Treatment  Patient Details  Name: Joanna [place] MRN: [id number] Date of Birth: 01/29/1956 Referring Provider: Kristie Edmisten, PA-C for Frank Aluisio, MD   Encounter Date: 03/01/2018  PT End of Session - 03/01/18 0756    Visit Number  17    Number of Visits  18    Date for PT Re-Evaluation  03/01/18    Authorization Type  [facility]    PT Start Time  0756    PT Stop Time  0852    PT Time Calculation (min)  56 min    Activity Tolerance  Patient tolerated treatment well    Behavior During Therapy  WFL for tasks assessed/performed       Past Medical History:  Diagnosis Date  . Arthritis   . Depression   . GERD (gastroesophageal reflux disease)   . Hypercholesteremia   . PONV (postoperative nausea and vomiting)   . Postmenopausal   . Wears glasses     Past Surgical History:  Procedure Laterality Date  . ABDOMINAL HYSTERECTOMY  2005   did not remove ovaries  . CESAREAN SECTION  2000  . COLONOSCOPY    . CONVERSION TO TOTAL KNEE Left 01/13/2018   Procedure: Revision of Left knee unicompartmental arthroplasty to total knee arthroplasty;  Surgeon: Aluisio, Frank, MD;  [facility];  Service: Orthopedics;  Laterality: Left;  . FOOT ARTHRODESIS  1995   right  . KNEE ARTHROSCOPY WITH MEDIAL MENISECTOMY Left 05/17/2014   Procedure: LEFT ARTHROSCOPY KNEE WITH PARTIAL MEDIAL MENISECTOMY, CHONDROPLASTY OF LATERAL FEMORAL CONDYLE;  Surgeon: John L Graves, MD;  Location: [facility];  Service: Orthopedics;  Laterality: Left;  . PARTIAL KNEE ARTHROPLASTY Left 01/21/2017   Procedure: UNICOMPARTMENTAL LEFT KNEE;  Surgeon: Aluisio, Frank, MD;  [facility];  Service: Orthopedics;  Laterality: Left;  . Revision left knee     Dr. Aluisio 01-13-18    There were no vitals filed for this  visit.  Subjective Assessment - 03/01/18 0757    Subjective  Pt reports she has been on her feet more with last minute prep for moving this week - noting inreased pain and stiffness with intermittent "shooting nervy pain" down lower leg.    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  Yes    Pain Score  3     Pain Location  Knee    Pain Orientation  Left    Pain Descriptors / Indicators  Aching;Shooting    Pain Type  Surgical pain;Acute pain    Pain Frequency  Intermittent                       [facility] Adult PT Treatment/Exercise - 03/01/18 0756      Exercises   Exercises  Knee/Hip      Knee/Hip Exercises: Aerobic   Recumbent Bike  6 min - rocking for ROM, occasional full revolutions with hip hike      Knee/Hip Exercises: Standing   Forward Lunges  Right;Left;10 reps;3 seconds    Forward Lunges Limitations  single UE support    Forward Step Up  Left;15 reps;Step Height: 8";Hand Hold: 1;2 sets    Forward Step Up Limitations  + blue TB TKE (ant pull x 1 set &   lat pull x 1 set); cues for full L knee extension before accepting weight on R foot    SLS with Vectors  L SLS on blue foam oval with R toe tap to 4 balance pebbles x10    Other Standing Knee Exercises  B side stepping with yellow TB at mid foot & Fwd/back monster walk with yellow TB at ankles 2 x 25 ft each    Other Standing Knee Exercises  Fitter B hip abduction & extension (1 black) x10 each      Modalities   Modalities  Vasopneumatic      Vasopneumatic   Number Minutes Vasopneumatic   10 minutes    Vasopnuematic Location   Knee    Vasopneumatic Pressure  High    Vasopneumatic Temperature   Lowest      Manual Therapy   Manual Therapy  Joint mobilization;Soft tissue mobilization    Manual therapy comments  hooklying    Joint Mobilization  L inferiror patellar mobs + tibiofemoral A/P & P/A mobs for increased flexion ROM    Soft tissue mobilization   proximal L quads               PT Short Term Goals - 02/23/18 0845      PT SHORT TERM GOAL #1   Title  Pt will be independent with initial HEP    Status  Achieved      PT SHORT TERM GOAL #2   Title  Pt will have 90 degrees of L knee AROM flexion    Status  Achieved        PT Long Term Goals - 02/15/18 1000      PT LONG TERM GOAL #1   Title  Pt to be independent with advanced HEP    Status  Partially Met      PT LONG TERM GOAL #2   Title  Patient to demonstrate 0-120 degrees of AROM at L knee to improve gait and abilty to perform ADLs independently    Status  On-going      PT LONG TERM GOAL #3   Title  Patient to report ability to take care of animals and garden independently with no increased pain.     Status  On-going      PT LONG TERM GOAL #4   Title  Pt to report pain at worst 4/10 with activity     Status  On-going      PT LONG TERM GOAL #5   Title  Pt will demonstrate normal gait pattern with LRAD in order to improve functional mobility and decrease fall risk    Status  On-going      PT LONG TERM GOAL #6   Title  Pt will have LE strength globally of 4+/5 to improve functional mobility and perform ADLs and job-related activities safely    Status  Partially Met            Plan - 03/01/18 0800    Clinical Impression Statement  Joanna Willis reporting improving walking tolerance, noting ability to complete full grocery shopping upon leaving last therapy session w/o issue. Does note some increased pain and stiffness after having been on her feet more over the weekend while preparing to move. Good tolerance for strengthening progression with some fatigue noted but no increased pain. Pt continues to have good potential to further benefit from skilled PT to maximize functional ROM and LE strength to normalize gait pattern and prepare for return to work.,   therefore will plan for recert on next visit.    Rehab Potential  Good    PT Treatment/Interventions  ADLs/Self  Care Home Management;Cryotherapy;Electrical Stimulation;Iontophoresis 4mg/ml Dexamethasone;Moist Heat;Ultrasound;DME Instruction;Gait training;Stair training;Functional mobility training;Therapeutic activities;Therapeutic exercise;Balance training;Neuromuscular re-education;Patient/family education;Manual techniques;Scar mobilization;Passive range of motion;Dry needling;Taping;Vasopneumatic Device    Consulted and Agree with Plan of Care  Patient       Patient will benefit from skilled therapeutic intervention in order to improve the following deficits and impairments:  Abnormal gait, Decreased skin integrity, Hypomobility, Impaired sensation, Increased edema, Decreased activity tolerance, Decreased strength, Increased fascial restricitons, Pain, Decreased mobility, Difficulty walking, Increased muscle spasms, Decreased range of motion, Improper body mechanics, Impaired flexibility, Decreased balance  Visit Diagnosis: Acute pain of left knee  Difficulty in walking, not elsewhere classified  Other symptoms and signs involving the musculoskeletal system  Stiffness of left knee, not elsewhere classified  Muscle weakness (generalized)  Other abnormalities of gait and mobility     Problem List Patient Active Problem List   Diagnosis Date Noted  . S/P left unicompartmental knee replacement 01/13/2018  . OA (osteoarthritis) of knee 01/21/2017  . Left knee pain 04/19/2014  . BRONCHITIS, ACUTE 07/21/2010    JoAnne M Kreis, PT, MPT 03/01/2018, 8:43 AM  [facility] [place] [address]  [address] [place], [place], [address] Phone: [phone]   Fax:  [phone]  Name: Joanna [place] MRN: [id number] Date of Birth: 07/22/1955   

## 2018-03-03 DIAGNOSIS — F4323 Adjustment disorder with mixed anxiety and depressed mood: Secondary | ICD-10-CM | POA: Diagnosis not present

## 2018-03-03 DIAGNOSIS — E785 Hyperlipidemia, unspecified: Secondary | ICD-10-CM | POA: Diagnosis not present

## 2018-03-03 DIAGNOSIS — G2581 Restless legs syndrome: Secondary | ICD-10-CM | POA: Diagnosis not present

## 2018-03-03 DIAGNOSIS — Z0000 Encounter for general adult medical examination without abnormal findings: Secondary | ICD-10-CM | POA: Diagnosis not present

## 2018-03-03 DIAGNOSIS — Z79890 Hormone replacement therapy (postmenopausal): Secondary | ICD-10-CM | POA: Insufficient documentation

## 2018-03-03 DIAGNOSIS — K219 Gastro-esophageal reflux disease without esophagitis: Secondary | ICD-10-CM | POA: Diagnosis not present

## 2018-03-03 MED FILL — ESTRADIOL 0.025 MG PATCH: 0.025 | 84 days supply | Qty: 24 | Fill #0

## 2018-03-03 MED FILL — CITALOPRAM HBR 20 MG TABLET: 20 | 90 days supply | Qty: 90 | Fill #0

## 2018-03-03 MED FILL — rOPINIRole HCL 0.25 MG TABS: 0.25 | 90 days supply | Qty: 90 | Fill #0

## 2018-03-03 MED FILL — ATORVASTATIN CALCIUM 20 MG: 20 | 90 days supply | Qty: 90 | Fill #0

## 2018-03-04 ENCOUNTER — Ambulatory Visit: Payer: 59 | Admitting: Physical Therapy

## 2018-03-04 ENCOUNTER — Encounter: Payer: Self-pay | Admitting: Physical Therapy

## 2018-03-04 DIAGNOSIS — R29898 Other symptoms and signs involving the musculoskeletal system: Secondary | ICD-10-CM

## 2018-03-04 DIAGNOSIS — R262 Difficulty in walking, not elsewhere classified: Secondary | ICD-10-CM | POA: Diagnosis not present

## 2018-03-04 DIAGNOSIS — M25662 Stiffness of left knee, not elsewhere classified: Secondary | ICD-10-CM | POA: Diagnosis not present

## 2018-03-04 DIAGNOSIS — R2689 Other abnormalities of gait and mobility: Secondary | ICD-10-CM

## 2018-03-04 DIAGNOSIS — M25562 Pain in left knee: Secondary | ICD-10-CM | POA: Diagnosis not present

## 2018-03-04 DIAGNOSIS — M6281 Muscle weakness (generalized): Secondary | ICD-10-CM

## 2018-03-04 NOTE — Therapy (Signed)
[facility] [address]  [place], [place], [address] Phone: [phone]   Fax:  [phone]  Physical Therapy Treatment  Patient Details  Name: Joanna Willis MRN: [id number] Date of Birth: 1956-02-27 Referring Provider: Theresa Duty, PA-C for Joanna Arabian, MD   Encounter Date: 03/04/2018  PT End of Session - 03/04/18 1103    Visit Number  18    Number of Visits  30    Date for PT Re-Evaluation  04/15/18    Authorization Type  [facility]    PT Start Time  1103    PT Stop Time  1210    PT Time Calculation (min)  67 min    Activity Tolerance  Patient tolerated treatment well    Behavior During Therapy  [facility] for tasks assessed/performed       Past Medical History:  Diagnosis Date  . Arthritis   . Depression   . GERD (gastroesophageal reflux disease)   . Hypercholesteremia   . PONV (postoperative nausea and vomiting)   . Postmenopausal   . Wears glasses     Past Surgical History:  Procedure Laterality Date  . ABDOMINAL HYSTERECTOMY  2005   did not remove ovaries  . CESAREAN SECTION  2000  . COLONOSCOPY    . CONVERSION TO TOTAL KNEE Left 01/13/2018   Procedure: Revision of Left knee unicompartmental arthroplasty to total knee arthroplasty;  Surgeon: Joanna Arabian, MD;  Location: [facility];  Service: Orthopedics;  Laterality: Left;  . FOOT ARTHRODESIS  1995   right  . KNEE ARTHROSCOPY WITH MEDIAL MENISECTOMY Left 05/17/2014   Procedure: LEFT ARTHROSCOPY KNEE WITH PARTIAL MEDIAL MENISECTOMY, CHONDROPLASTY OF LATERAL FEMORAL CONDYLE;  Surgeon: Alta Corning, MD;  Location: [facility];  Service: Orthopedics;  Laterality: Left;  . PARTIAL KNEE ARTHROPLASTY Left 01/21/2017   Procedure: UNICOMPARTMENTAL LEFT KNEE;  Surgeon: Joanna Arabian, MD;  Location: [facility];  Service: Orthopedics;  Laterality: Left;  . Revision left knee     Dr. Wynelle Link 01-13-18    There were no vitals filed for this  visit.  Subjective Assessment - 03/04/18 1109    Subjective  Pt continues to note increased discomfort this week as she has been doing more standing while packing and preparing for moving tomorrow.    Limitations  Standing;Walking;House hold activities    How long can you stand comfortably?  1 hr    How long can you walk comfortably?  15 min    Patient Stated Goals  Return to work and be able to take care of animals    Currently in Pain?  Yes    Pain Score  4     Pain Location  Knee    Pain Orientation  Left    Pain Descriptors / Indicators  Dull;Aching;Shooting;Nagging    Pain Type  Surgical pain;Acute pain    Pain Radiating Towards  brief shooting pain down anterior shin    Pain Onset  More than a month ago    Pain Frequency  Intermittent    Aggravating Factors   prolonged standing; walking long distances    Pain Relieving Factors  Advil, ice    Effect of Pain on Daily Activities  limited with lifting & carrying things; limited standing and walking tolerance; difficulty with stair descent         [facility] PT Assessment - 03/04/18 1103      Assessment   Medical Diagnosis  L TKA  Referring Provider  Theresa Duty, PA-C for Joanna Arabian, MD    Onset Date/Surgical Date  01/13/18    Next MD Visit  03/16/18      Prior Function   Level of Independence  Independent    Vocation  Full time employment    Vocation Requirements  Cardiac rehab nurse    Leisure  Garden, quilt, take care of animals including goats and dogs      AROM   Left Knee Extension  3    Left Knee Flexion  97      PROM   Left Knee Extension  1    Left Knee Flexion  99      Strength   Right Hip Flexion  5/5    Right Hip Extension  4+/5    Right Hip ABduction  5/5    Right Hip ADduction  5/5    Left Hip Flexion  4-/5   pain at knee with resistance   Left Hip Extension  4-/5    Left Hip ABduction  4/5    Left Hip ADduction  4+/5    Right Knee Flexion  5/5    Right Knee Extension  5/5    Left Knee  Flexion  4/5   mild pain with resistance   Left Knee Extension  4+/5      Ambulation/Gait   Ambulation/Gait Assistance  6: Modified independent (Device/Increase time)    Assistive device  None    Gait Pattern  Decreased hip/knee flexion - left;Left flexed knee in stance;Decreased weight shift to left;Antalgic    Ambulation Surface  Level;Indoor                   [facility] Adult PT Treatment/Exercise - 03/04/18 1103      Exercises   Exercises  Knee/Hip      Knee/Hip Exercises: Standing   Step Down  Left;15 reps;Step Height: 6";Hand Hold: 2    Step Down Limitations  eccentric lowering with heel touch    Wall Squat  10 reps;3 seconds      Knee/Hip Exercises: Supine   Bridges  Both;5 reps    Bridges Limitations  straight leg with heels on peanut ball - stopped d/t L posterior knee pain    Straight Leg Raises  Left;10 reps    Straight Leg Raises Limitations  3#    Straight Leg Raise with External Rotation  Left;10 reps;Strengthening    Straight Leg Raise with External Rotation Limitations  3#    Knee Flexion  Both;15 reps;Left;AAROM    Knee Flexion Limitations  heels on peanut ball    Other Supine Knee/Hip Exercises  HS curl + bridge with heels on peanut ball x10      Modalities   Modalities  Vasopneumatic      Vasopneumatic   Number Minutes Vasopneumatic   15 minutes    Vasopnuematic Location   Knee    Vasopneumatic Pressure  High    Vasopneumatic Temperature   Lowest      Manual Therapy   Manual Therapy  Joint mobilization;Soft tissue mobilization    Manual therapy comments  hooklying    Joint Mobilization  L inferiror patellar mobs + tibiofemoral A/P & P/A mobs for increased flexion ROM    Soft tissue mobilization  proximal L quads, distal med/lat L HS, proximal L gastroc - improving overall muscle tension with small taut band persisting in lateral gastroc & mild increased tension in proximal quads  PT Short Term Goals - 02/23/18 0845       PT SHORT TERM GOAL #1   Title  Pt will be independent with initial HEP    Status  Achieved      PT SHORT TERM GOAL #2   Title  Pt will have 90 degrees of L knee AROM flexion    Status  Achieved        PT Long Term Goals - 03/04/18 1118      PT LONG TERM GOAL #1   Title  Pt to be independent with advanced HEP    Status  Partially Met    Target Date  04/15/18      PT LONG TERM GOAL #2   Title  Patient to demonstrate 0-120 degrees of AROM at L knee to improve gait and abilty to perform ADLs independently    Status  On-going    Target Date  04/15/18      PT LONG TERM GOAL #3   Title  Patient to report ability to take care of animals and garden independently with no increased pain.     Status  Partially Met    Target Date  04/15/18      PT LONG TERM GOAL #4   Title  Pt to report pain at worst 4/10 with activity     Status  On-going    Target Date  04/15/18      PT LONG TERM GOAL #5   Title  Pt will demonstrate normal gait pattern with LRAD in order to improve functional mobility and decrease fall risk    Status  On-going    Target Date  04/15/18      PT LONG TERM GOAL #6   Title  Pt will have LE strength globally of 4+/5 to improve functional mobility and perform ADLs and job-related activities safely    Status  Partially Met    Target Date  04/15/18      PT LONG TERM GOAL #7   Title  Pt will ascend/descend stairs reciprocally with good gait pattern to allow access to all levels of new home    Status  New    Target Date  04/15/18            Plan - 03/04/18 1202    Clinical Impression Statement  Joanna Willis continues to demonstrate steady progress with L knee ROM even despite increaesed activity and soreness this week with packing and preparing to move - L knee AROM currently 3-97 dg and PROM 1-99 dg. B LE strength also improving with more proximal weakness remaining at present. She has successfully weaned from all AD at this time, but still demonstrates mild antalgic gait  pattern at times and notes it still requires conscious effort to walk normally. She notes ongoing difficulty with stair negotiation which will be needed more in her new home, as well as continued limitations with carrying water and hay to feed her aniimals. All STGs met at this time and some of LTGs now partially met. Joanna Willis will continue to benefit from further skilled PT to maximize functional L knee AROM and B LE strength to allow for resumption of normal daily tasks and chores as well as return to work.    Rehab Potential  Good    PT Frequency  2x / week    PT Duration  6 weeks   4-6 wks   PT Treatment/Interventions  ADLs/Self Care Home Management;Cryotherapy;Electrical Stimulation;Iontophoresis 52m/ml Dexamethasone;Moist Heat;Ultrasound;DME Instruction;Gait training;Stair training;Functional  mobility training;Therapeutic activities;Therapeutic exercise;Balance training;Neuromuscular re-education;Patient/family education;Manual techniques;Scar mobilization;Passive range of motion;Dry needling;Taping;Vasopneumatic Device    Consulted and Agree with Plan of Care  Patient       Patient will benefit from skilled therapeutic intervention in order to improve the following deficits and impairments:  Abnormal gait, Decreased skin integrity, Hypomobility, Impaired sensation, Increased edema, Decreased activity tolerance, Decreased strength, Increased fascial restricitons, Pain, Decreased mobility, Difficulty walking, Increased muscle spasms, Decreased range of motion, Improper body mechanics, Impaired flexibility, Decreased balance  Visit Diagnosis: Acute pain of left knee  Difficulty in walking, not elsewhere classified  Other symptoms and signs involving the musculoskeletal system  Stiffness of left knee, not elsewhere classified  Muscle weakness (generalized)  Other abnormalities of gait and mobility     Problem List Patient Active Problem List   Diagnosis Date Noted  . S/P left  unicompartmental knee replacement 01/13/2018  . OA (osteoarthritis) of knee 01/21/2017  . Left knee pain 04/19/2014  . BRONCHITIS, ACUTE 07/21/2010    Percival Spanish, PT, MPT 03/04/2018, 12:28 PM  [facility] [address]  [place], [place], [address] Phone: [phone]   Fax:  [phone]  Name: Joanna Willis MRN: [id number] Date of Birth: 1956-01-31

## 2018-03-08 ENCOUNTER — Ambulatory Visit: Payer: 59

## 2018-03-08 DIAGNOSIS — R29898 Other symptoms and signs involving the musculoskeletal system: Secondary | ICD-10-CM | POA: Diagnosis not present

## 2018-03-08 DIAGNOSIS — M6281 Muscle weakness (generalized): Secondary | ICD-10-CM | POA: Diagnosis not present

## 2018-03-08 DIAGNOSIS — M25662 Stiffness of left knee, not elsewhere classified: Secondary | ICD-10-CM | POA: Diagnosis not present

## 2018-03-08 DIAGNOSIS — M25562 Pain in left knee: Secondary | ICD-10-CM | POA: Diagnosis not present

## 2018-03-08 DIAGNOSIS — R2689 Other abnormalities of gait and mobility: Secondary | ICD-10-CM

## 2018-03-08 DIAGNOSIS — R262 Difficulty in walking, not elsewhere classified: Secondary | ICD-10-CM | POA: Diagnosis not present

## 2018-03-08 NOTE — Therapy (Signed)
[facility] [address]  [place], [place], [address] Phone: [phone]   Fax:  [phone]  Physical Therapy Treatment  Patient Details  Name: Joanna Willis MRN: [id number] Date of Birth: Oct 30, 1955 Referring Provider: Theresa Duty, PA-C for Joanna Arabian, MD   Encounter Date: 03/08/2018  PT End of Session - 03/08/18 1105    Visit Number  19    Number of Visits  30    Date for PT Re-Evaluation  04/15/18    Authorization Type  [facility]    PT Start Time  1101    PT Stop Time  1205    PT Time Calculation (min)  64 min    Activity Tolerance  Patient tolerated treatment well    Behavior During Therapy  [facility] for tasks assessed/performed       Past Medical History:  Diagnosis Date  . Arthritis   . Depression   . GERD (gastroesophageal reflux disease)   . Hypercholesteremia   . PONV (postoperative nausea and vomiting)   . Postmenopausal   . Wears glasses     Past Surgical History:  Procedure Laterality Date  . ABDOMINAL HYSTERECTOMY  2005   did not remove ovaries  . CESAREAN SECTION  2000  . COLONOSCOPY    . CONVERSION TO TOTAL KNEE Left 01/13/2018   Procedure: Revision of Left knee unicompartmental arthroplasty to total knee arthroplasty;  Surgeon: Joanna Arabian, MD;  Location: [facility];  Service: Orthopedics;  Laterality: Left;  . FOOT ARTHRODESIS  1995   right  . KNEE ARTHROSCOPY WITH MEDIAL MENISECTOMY Left 05/17/2014   Procedure: LEFT ARTHROSCOPY KNEE WITH PARTIAL MEDIAL MENISECTOMY, CHONDROPLASTY OF LATERAL FEMORAL CONDYLE;  Surgeon: Alta Corning, MD;  Location: [facility];  Service: Orthopedics;  Laterality: Left;  . PARTIAL KNEE ARTHROPLASTY Left 01/21/2017   Procedure: UNICOMPARTMENTAL LEFT KNEE;  Surgeon: Joanna Arabian, MD;  Location: [facility];  Service: Orthopedics;  Laterality: Left;  . Revision left knee     Joanna Willis 01-13-18    There were no vitals filed for this  visit.  Subjective Assessment - 03/08/18 1103    Subjective  Pt. doing well today and has been icing over this past week with busy moving schedule.      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  Yes    Pain Score  3     Pain Location  Knee    Pain Orientation  Left    Pain Descriptors / Indicators  Dull;Aching    Pain Type  Acute pain;Surgical pain    Pain Onset  More than a month ago    Pain Frequency  Intermittent    Aggravating Factors   prolonged standing    Pain Relieving Factors  ice, advil     Multiple Pain Sites  No                       [facility] Adult PT Treatment/Exercise - 03/08/18 1126      Knee/Hip Exercises: Stretches   Passive Hamstring Stretch  Left;1 rep;30 seconds    Passive Hamstring Stretch Limitations  with strap     Quad Stretch  Left;30 seconds;2 reps    Quad Stretch Limitations  prone with strap       Knee/Hip Exercises: Aerobic   Recumbent Bike  6 min - full revolutions  Knee/Hip Exercises: Machines for Strengthening   Cybex Leg Press  B LE 25# x 20;  B straight leg calf raise 25# x 20 reps       Knee/Hip Exercises: Standing   Hip Flexion  Right;10 reps;Stengthening;1 set    Hip Flexion Limitations  L SLS on foam with red looped TB at ankles    Hip Abduction  Right;10 reps;Stengthening;1 set    Abduction Limitations  L SLS on foam oval; red looped TB at ankles    Hip Extension  Right;10 reps;Knee bent;1 set;Stengthening    Extension Limitations  L SLS on foam oval; red looped TB at ankles    Lateral Step Up  15 reps;Left;Hand Hold: 0    Functional Squat  20 reps;3 seconds    Functional Squat Limitations  counter     Wall Squat  3 seconds   x 12 reps; ~ 80 dg knee flexion      Vasopneumatic   Number Minutes Vasopneumatic   15 minutes    Vasopnuematic Location   Knee    Vasopneumatic Pressure  High    Vasopneumatic Temperature   Lowest      Manual Therapy   Manual Therapy   Joint mobilization;Soft tissue mobilization    Manual therapy comments  hooklying    Joint Mobilization  L inferior/superior patellar mobs for improved motion     Soft tissue mobilization  STM to L medial HS - increased tension and TP noted    Myofascial Release  TPR to L medial HS with pt. noting decreased tenderness following                PT Short Term Goals - 02/23/18 0845      PT SHORT TERM GOAL #1   Title  Pt will be independent with initial HEP    Status  Achieved      PT SHORT TERM GOAL #2   Title  Pt will have 90 degrees of L knee AROM flexion    Status  Achieved        PT Long Term Goals - 03/04/18 1118      PT LONG TERM GOAL #1   Title  Pt to be independent with advanced HEP    Status  Partially Met    Target Date  04/15/18      PT LONG TERM GOAL #2   Title  Patient to demonstrate 0-120 degrees of AROM at L knee to improve gait and abilty to perform ADLs independently    Status  On-going    Target Date  04/15/18      PT LONG TERM GOAL #3   Title  Patient to report ability to take care of animals and garden independently with no increased pain.     Status  Partially Met    Target Date  04/15/18      PT LONG TERM GOAL #4   Title  Pt to report pain at worst 4/10 with activity     Status  On-going    Target Date  04/15/18      PT LONG TERM GOAL #5   Title  Pt will demonstrate normal gait pattern with LRAD in order to improve functional mobility and decrease fall risk    Status  On-going    Target Date  04/15/18      PT LONG TERM GOAL #6   Title  Pt will have LE strength globally of 4+/5 to improve functional mobility and perform ADLs  and job-related activities safely    Status  Partially Met    Target Date  04/15/18      PT LONG TERM GOAL #7   Title  Pt will ascend/descend stairs reciprocally with good gait pattern to allow access to all levels of new home    Status  New    Target Date  04/15/18            Plan - 03/08/18 1106     Clinical Impression Statement  Joanna Willis with complaint of L medial HS tightness after prolonged walking and with palpable TP's/increased muscle tension in this area today.  Noted improvement in L medial/posterior knee comfort following manual therapy.  Tolerated all ROM and strengthening activities well today.  Able to progress lateral step-ups, wall squats, and leg press strengthening activities without issue.  Demo'd improvement in functional knee flexion ROM on bike today and ROM seems to be visibly improving however not formally measured today.  Will continue to progress toward goals.      PT Treatment/Interventions  ADLs/Self Care Home Management;Cryotherapy;Electrical Stimulation;Iontophoresis 12m/ml Dexamethasone;Moist Heat;Ultrasound;DME Instruction;Gait training;Stair training;Functional mobility training;Therapeutic activities;Therapeutic exercise;Balance training;Neuromuscular re-education;Patient/family education;Manual techniques;Scar mobilization;Passive range of motion;Dry needling;Taping;Vasopneumatic Device    Consulted and Agree with Plan of Care  Patient       Patient will benefit from skilled therapeutic intervention in order to improve the following deficits and impairments:  Abnormal gait, Decreased skin integrity, Hypomobility, Impaired sensation, Increased edema, Decreased activity tolerance, Decreased strength, Increased fascial restricitons, Pain, Decreased mobility, Difficulty walking, Increased muscle spasms, Decreased range of motion, Improper body mechanics, Impaired flexibility, Decreased balance  Visit Diagnosis: Acute pain of left knee  Difficulty in walking, not elsewhere classified  Other symptoms and signs involving the musculoskeletal system  Stiffness of left knee, not elsewhere classified  Muscle weakness (generalized)  Other abnormalities of gait and mobility     Problem List Patient Active Problem List   Diagnosis Date Noted  . S/P left unicompartmental  knee replacement 01/13/2018  . OA (osteoarthritis) of knee 01/21/2017  . Left knee pain 04/19/2014  . BRONCHITIS, ACUTE 07/21/2010    MBess Harvest PTA 03/08/18 12:14 PM    [facility] [address] [place] [address]Phone: [phone]  Fax:  [phone] Name: JBRIONY PARVEENMRN: [id number] of Birth: 111-24-1957

## 2018-03-10 ENCOUNTER — Encounter: Payer: 59 | Admitting: Physical Therapy

## 2018-03-12 ENCOUNTER — Ambulatory Visit: Payer: 59

## 2018-03-12 DIAGNOSIS — R262 Difficulty in walking, not elsewhere classified: Secondary | ICD-10-CM

## 2018-03-12 DIAGNOSIS — M25562 Pain in left knee: Secondary | ICD-10-CM | POA: Diagnosis not present

## 2018-03-12 DIAGNOSIS — M6281 Muscle weakness (generalized): Secondary | ICD-10-CM | POA: Diagnosis not present

## 2018-03-12 DIAGNOSIS — R2689 Other abnormalities of gait and mobility: Secondary | ICD-10-CM

## 2018-03-12 DIAGNOSIS — M25662 Stiffness of left knee, not elsewhere classified: Secondary | ICD-10-CM

## 2018-03-12 DIAGNOSIS — R29898 Other symptoms and signs involving the musculoskeletal system: Secondary | ICD-10-CM

## 2018-03-12 NOTE — Therapy (Signed)
[facility] [address]  [place], [place], [address] Phone: [phone]   Fax:  [phone]  Physical Therapy Treatment  Patient Details  Name: Joanna Willis MRN: [id number] Date of Birth: [birth year] Referring Provider: Theresa Duty, PA-C for [name], MD   Encounter Date: 03/12/2018  PT End of Session - 03/12/18 0856    Visit Number  20    Number of Visits  30    Date for PT Re-Evaluation  04/15/18    Authorization Type  [facility]    PT Start Time  0852    PT Stop Time  0945    PT Time Calculation (min)  53 min    Activity Tolerance  Patient tolerated treatment well    Behavior During Therapy  [facility] for tasks assessed/performed       Past Medical History:  Diagnosis Date  . Arthritis   . Depression   . GERD (gastroesophageal reflux disease)   . Hypercholesteremia   . PONV (postoperative nausea and vomiting)   . Postmenopausal   . Wears glasses     Past Surgical History:  Procedure Laterality Date  . ABDOMINAL HYSTERECTOMY  2005   did not remove ovaries  . CESAREAN SECTION  2000  . COLONOSCOPY    . CONVERSION TO TOTAL KNEE Left 01/13/2018   Procedure: Revision of Left knee unicompartmental arthroplasty to total knee arthroplasty;  Surgeon: [name], MD;  Location: [facility];  Service: Orthopedics;  Laterality: Left;  . FOOT ARTHRODESIS  1995   right  . KNEE ARTHROSCOPY WITH MEDIAL MENISECTOMY Left 05/17/2014   Procedure: LEFT ARTHROSCOPY KNEE WITH PARTIAL MEDIAL MENISECTOMY, CHONDROPLASTY OF LATERAL FEMORAL CONDYLE;  Surgeon: Alta Corning, MD;  Location: [place];  Service: Orthopedics;  Laterality: Left;  . PARTIAL KNEE ARTHROPLASTY Left 01/21/2017   Procedure: UNICOMPARTMENTAL LEFT KNEE;  Surgeon: [name], MD;  Location: [facility];  Service: Orthopedics;  Laterality: Left;  . Revision left knee     Dr. Wynelle Link 01-13-18    There were no vitals filed for this  visit.  Subjective Assessment - 03/12/18 0856    Subjective  Pt. doing well today.  Somewhat tired after moving and feels increased swelling since "overdoing it" yesterday.      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  Yes    Pain Score  3     Pain Location  Knee    Pain Orientation  Left    Pain Descriptors / Indicators  Aching;Dull    Pain Type  Acute pain;Surgical pain    Pain Onset  More than a month ago    Pain Frequency  Intermittent         [facility] PT Assessment - 03/12/18 0913      Observation/Other Assessments   Focus on Therapeutic Outcomes (FOTO)   49% (51% limitation)      AROM   Left Knee Extension  3    Left Knee Flexion  95      PROM   Left Knee Extension  1    Left Knee Flexion  100      Strength   Right Hip Flexion  5/5    Right Hip ABduction  5/5    Right Hip ADduction  5/5    Left Hip Flexion  4/5    Left Hip ABduction  4+/5  Left Hip ADduction  4+/5    Right/Left Knee  Right;Left    Right Knee Flexion  5/5    Right Knee Extension  5/5    Left Knee Flexion  4/5   pain on max resistance    Left Knee Extension  5/5                   [facility] Adult PT Treatment/Exercise - 03/12/18 1226      Ambulation/Gait   Gait Pattern  Decreased hip/knee flexion - left;Left flexed knee in stance;Decreased weight shift to left;Antalgic    Stairs  Yes    Stairs Assistance  6: Modified independent (Device/Increase time)    Stair Management Technique  Alternating pattern;One rail Right    Number of Stairs  14    Height of Stairs  8    Gait Comments  Pt. able to navigate 14 stair reciprocally with one rail use and only slight L quad instability noted.        Knee/Hip Exercises: Stretches   Knee: Self-Stretch to increase Flexion  Left;5 reps   x 10 sec standing into 9" stool flexion stretch     Knee/Hip Exercises: Aerobic   Recumbent Bike  6 min - full revolutions       Vasopneumatic   Number  Minutes Vasopneumatic   15 minutes    Vasopnuematic Location   Knee    Vasopneumatic Pressure  High    Vasopneumatic Temperature   Lowest      Manual Therapy   Manual Therapy  Joint mobilization    Manual therapy comments  hooklying    Joint Mobilization  L inferior/superior patellar mobs for improved motion; L knee A/P mobs for improved ROM             PT Education - 03/12/18 1231    Education Details  HEP update    Person(s) Educated  Patient    Methods  Explanation;Demonstration;Verbal cues    Comprehension  Verbalized understanding;Returned demonstration;Verbal cues required;Need further instruction       PT Short Term Goals - 02/23/18 0845      PT SHORT TERM GOAL #1   Title  Pt will be independent with initial HEP    Status  Achieved      PT SHORT TERM GOAL #2   Title  Pt will have 90 degrees of L knee AROM flexion    Status  Achieved        PT Long Term Goals - 03/12/18 0912      PT LONG TERM GOAL #1   Title  Pt to be independent with advanced HEP    Status  Partially Met      PT LONG TERM GOAL #2   Title  Patient to demonstrate 0-120 degrees of AROM at L knee to improve gait and abilty to perform ADLs independently    Status  On-going      PT LONG TERM GOAL #3   Title  Patient to report ability to take care of animals and garden independently with no increased pain.     Status  Partially Met      PT LONG TERM GOAL #4   Title  Pt to report pain at worst 4/10 with activity     Status  On-going      PT LONG TERM GOAL #5   Title  Pt will demonstrate normal gait pattern with LRAD in order to improve functional mobility and decrease fall risk  Status  On-going      PT LONG TERM GOAL #6   Title  Pt will have LE strength globally of 4+/5 to improve functional mobility and perform ADLs and job-related activities safely    Status  Partially Met      PT LONG TERM GOAL #7   Title  Pt will ascend/descend stairs reciprocally with good gait pattern to allow  access to all levels of new home    Status  Achieved            Plan - 03/12/18 1230    Clinical Impression Statement  Joanna Willis reporting she, "overdid it", two days ago moving boxes and "chasing chickens" and has had some increased L knee swelling since Wednesday.  Making good progress with therapy able to demo improvement in L knee PROM to 1-100 dg today and demonstrating improvement in LE strength and gait pattern.  Able to meet stair navigation goal now only with slight visible quad instability on descending however safe with overall reciprocal pattern.  Sessions focusing on manual therapy for improved flexion ROM and HEP update to continue focus on remaining LE weaknesses.  Ended visit with ice/compression to L knee to decrease visible swelling post-session.      PT Treatment/Interventions  ADLs/Self Care Home Management;Cryotherapy;Electrical Stimulation;Iontophoresis 55m/ml Dexamethasone;Moist Heat;Ultrasound;DME Instruction;Gait training;Stair training;Functional mobility training;Therapeutic activities;Therapeutic exercise;Balance training;Neuromuscular re-education;Patient/family education;Manual techniques;Scar mobilization;Passive range of motion;Dry needling;Taping;Vasopneumatic Device    Consulted and Agree with Plan of Care  Patient       Patient will benefit from skilled therapeutic intervention in order to improve the following deficits and impairments:  Abnormal gait, Decreased skin integrity, Hypomobility, Impaired sensation, Increased edema, Decreased activity tolerance, Decreased strength, Increased fascial restricitons, Pain, Decreased mobility, Difficulty walking, Increased muscle spasms, Decreased range of motion, Improper body mechanics, Impaired flexibility, Decreased balance  Visit Diagnosis: Acute pain of left knee  Difficulty in walking, not elsewhere classified  Other symptoms and signs involving the musculoskeletal system  Stiffness of left knee, not elsewhere  classified  Muscle weakness (generalized)  Other abnormalities of gait and mobility     Problem List Patient Active Problem List   Diagnosis Date Noted  . S/P left unicompartmental knee replacement 01/13/2018  . OA (osteoarthritis) of knee 01/21/2017  . Left knee pain 04/19/2014  . BRONCHITIS, ACUTE 07/21/2010    MBess Harvest PTA 03/12/18 12:43 PM   [facility] [address] [place] [address]Phone: [phone]  Fax:  [phone] Name: Joanna EASTBURNMRN: [id number] of Birth: 1May 03, 1957

## 2018-03-15 ENCOUNTER — Ambulatory Visit: Payer: 59

## 2018-03-15 DIAGNOSIS — M25662 Stiffness of left knee, not elsewhere classified: Secondary | ICD-10-CM

## 2018-03-15 DIAGNOSIS — R29898 Other symptoms and signs involving the musculoskeletal system: Secondary | ICD-10-CM | POA: Diagnosis not present

## 2018-03-15 DIAGNOSIS — M6281 Muscle weakness (generalized): Secondary | ICD-10-CM | POA: Diagnosis not present

## 2018-03-15 DIAGNOSIS — R262 Difficulty in walking, not elsewhere classified: Secondary | ICD-10-CM

## 2018-03-15 DIAGNOSIS — R2689 Other abnormalities of gait and mobility: Secondary | ICD-10-CM | POA: Diagnosis not present

## 2018-03-15 DIAGNOSIS — M25562 Pain in left knee: Secondary | ICD-10-CM

## 2018-03-15 NOTE — Therapy (Signed)
[facility] [address]  [place], [place], [address] Phone: [phone]   Fax:  [phone]  Physical Therapy Treatment  Patient Details  Name: Joanna Willis MRN: [id number] Date of Birth: 07-27-1955 Referring Provider: Theresa Duty, PA-C for [name], MD   Encounter Date: 03/15/2018  PT End of Session - 03/15/18 1116    Visit Number  21    Number of Visits  30    Date for PT Re-Evaluation  04/15/18    Authorization Type  [facility]    PT Start Time  1102    PT Stop Time  1203    PT Time Calculation (min)  61 min    Activity Tolerance  Patient tolerated treatment well    Behavior During Therapy  [facility] for tasks assessed/performed       Past Medical History:  Diagnosis Date  . Arthritis   . Depression   . GERD (gastroesophageal reflux disease)   . Hypercholesteremia   . PONV (postoperative nausea and vomiting)   . Postmenopausal   . Wears glasses     Past Surgical History:  Procedure Laterality Date  . ABDOMINAL HYSTERECTOMY  2005   did not remove ovaries  . CESAREAN SECTION  2000  . COLONOSCOPY    . CONVERSION TO TOTAL KNEE Left 01/13/2018   Procedure: Revision of Left knee unicompartmental arthroplasty to total knee arthroplasty;  Surgeon: [name], MD;  [facility];  Service: Orthopedics;  Laterality: Left;  . FOOT ARTHRODESIS  1995   right  . KNEE ARTHROSCOPY WITH MEDIAL MENISECTOMY Left 05/17/2014   Procedure: LEFT ARTHROSCOPY KNEE WITH PARTIAL MEDIAL MENISECTOMY, CHONDROPLASTY OF LATERAL FEMORAL CONDYLE;  Surgeon: Alta Corning, MD;  Location: [place];  Service: Orthopedics;  Laterality: Left;  . PARTIAL KNEE ARTHROPLASTY Left 01/21/2017   Procedure: UNICOMPARTMENTAL LEFT KNEE;  Surgeon: [name], MD;  Location[facility];  Service: Orthopedics;  Laterality: Left;  . Revision left knee     Dr. Wynelle Link 01-13-18    There were no vitals filed for this  visit.  Subjective Assessment - 03/15/18 1204    Subjective  Pt. noting no unusual soreness following last visit and has gone back to being consistent over weekend with HEP since moving last week.      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  Yes    Pain Score  3     Pain Location  Knee    Pain Orientation  Left    Pain Descriptors / Indicators  Aching;Dull    Pain Type  Acute pain;Surgical pain    Pain Onset  More than a month ago    Pain Frequency  Intermittent    Aggravating Factors   knee flexion     Pain Relieving Factors  ice    Multiple Pain Sites  No                       [facility] Adult PT Treatment/Exercise - 03/15/18 1129      Knee/Hip Exercises: Stretches   Quad Stretch  Left;1 rep;60 seconds    Quad Stretch Limitations  prone with strap     Knee: Self-Stretch to increase Flexion  10 seconds   x 10 reps    Other Knee/Hip Stretches  L groin butterfly stretch x 30  Knee/Hip Exercises: Aerobic   Recumbent Bike  6 min - full revolutions       Knee/Hip Exercises: Machines for Strengthening   Cybex Leg Press  B LE 25# x 20; 5" flexion stretch       Knee/Hip Exercises: Standing   Functional Squat  20 reps;3 seconds    Functional Squat Limitations  TRX      Knee/Hip Exercises: Seated   Hamstring Curl  Left;Strengthening;20 reps   issued to HEP    Hamstring Limitations  looped red TB with R foot propped on stool for anchor      Vasopneumatic   Number Minutes Vasopneumatic   10 minutes    Vasopnuematic Location   Knee    Vasopneumatic Pressure  High    Vasopneumatic Temperature   Lowest      Manual Therapy   Manual Therapy  Joint mobilization;Soft tissue mobilization;Muscle Energy Technique    Manual therapy comments  seated, supine, prone     Joint Mobilization  L inferior/superior patellar mobs for improved motion; L knee A/P mobs for improved ROM    Soft tissue mobilization  STM to L  medial HS, L groin  - much improved tenderness in HS however ttp in L distal groin     Muscle Energy Technique  L quad contract/relax stretch x 6 rounds for improved flexion ROM              PT Education - 03/15/18 1201    Education Details  HEP update     Person(s) Educated  Patient    Methods  Explanation;Demonstration;Verbal cues;Handout    Comprehension  Verbalized understanding;Returned demonstration;Verbal cues required;Need further instruction       PT Short Term Goals - 02/23/18 0845      PT SHORT TERM GOAL #1   Title  Pt will be independent with initial HEP    Status  Achieved      PT SHORT TERM GOAL #2   Title  Pt will have 90 degrees of L knee AROM flexion    Status  Achieved        PT Long Term Goals - 03/12/18 0912      PT LONG TERM GOAL #1   Title  Pt to be independent with advanced HEP    Status  Partially Met      PT LONG TERM GOAL #2   Title  Patient to demonstrate 0-120 degrees of AROM at L knee to improve gait and abilty to perform ADLs independently    Status  On-going      PT LONG TERM GOAL #3   Title  Patient to report ability to take care of animals and garden independently with no increased pain.     Status  Partially Met      PT LONG TERM GOAL #4   Title  Pt to report pain at worst 4/10 with activity     Status  On-going      PT LONG TERM GOAL #5   Title  Pt will demonstrate normal gait pattern with LRAD in order to improve functional mobility and decrease fall risk    Status  On-going      PT LONG TERM GOAL #6   Title  Pt will have LE strength globally of 4+/5 to improve functional mobility and perform ADLs and job-related activities safely    Status  Partially Met      PT LONG TERM GOAL #7   Title  Pt will  ascend/descend stairs reciprocally with good gait pattern to allow access to all levels of new home    Status  Achieved            Plan - 03/15/18 1117    Clinical Impression Statement  Tanetta noting she is more  consistent with HEP since finishing recent move and feels improvement in L HS tenderness.  Tolerated manual therapy focused on improving flexion ROM well today.  Duration of visit focused on functional knee flexion ROM strengthening activities with pt. tolerating well.  Ended visit with HEP updated to address complaint of groin tightness and provided alternate HS strengthening options for HEP per pt. request.  Will continue to progress toward goals.      PT Treatment/Interventions  ADLs/Self Care Home Management;Cryotherapy;Electrical Stimulation;Iontophoresis 16m/ml Dexamethasone;Moist Heat;Ultrasound;DME Instruction;Gait training;Stair training;Functional mobility training;Therapeutic activities;Therapeutic exercise;Balance training;Neuromuscular re-education;Patient/family education;Manual techniques;Scar mobilization;Passive range of motion;Dry needling;Taping;Vasopneumatic Device    Consulted and Agree with Plan of Care  Patient       Patient will benefit from skilled therapeutic intervention in order to improve the following deficits and impairments:  Abnormal gait, Decreased skin integrity, Hypomobility, Impaired sensation, Increased edema, Decreased activity tolerance, Decreased strength, Increased fascial restricitons, Pain, Decreased mobility, Difficulty walking, Increased muscle spasms, Decreased range of motion, Improper body mechanics, Impaired flexibility, Decreased balance  Visit Diagnosis: Acute pain of left knee  Difficulty in walking, not elsewhere classified  Other symptoms and signs involving the musculoskeletal system  Stiffness of left knee, not elsewhere classified  Muscle weakness (generalized)  Other abnormalities of gait and mobility     Problem List Patient Active Problem List   Diagnosis Date Noted  . S/P left unicompartmental knee replacement 01/13/2018  . OA (osteoarthritis) of knee 01/21/2017  . Left knee pain 04/19/2014  . BRONCHITIS, ACUTE 07/21/2010     MBess Harvest PTA 03/15/18 12:15 PM   [facility] [address] [place] [address]Phone: [phone]  Fax:  [phone] Name: JTAUNJA BRICKNERMRN: [id number] of Birth: 129-May-1957

## 2018-03-18 ENCOUNTER — Encounter: Payer: Self-pay | Admitting: Physical Therapy

## 2018-03-18 ENCOUNTER — Ambulatory Visit: Payer: 59 | Admitting: Physical Therapy

## 2018-03-18 DIAGNOSIS — M6281 Muscle weakness (generalized): Secondary | ICD-10-CM | POA: Diagnosis not present

## 2018-03-18 DIAGNOSIS — M25662 Stiffness of left knee, not elsewhere classified: Secondary | ICD-10-CM | POA: Diagnosis not present

## 2018-03-18 DIAGNOSIS — R2689 Other abnormalities of gait and mobility: Secondary | ICD-10-CM | POA: Diagnosis not present

## 2018-03-18 DIAGNOSIS — M25562 Pain in left knee: Secondary | ICD-10-CM

## 2018-03-18 DIAGNOSIS — R262 Difficulty in walking, not elsewhere classified: Secondary | ICD-10-CM

## 2018-03-18 DIAGNOSIS — R29898 Other symptoms and signs involving the musculoskeletal system: Secondary | ICD-10-CM | POA: Diagnosis not present

## 2018-03-18 NOTE — Therapy (Signed)
[facility] [address]  [place], [place], [address] Phone: [phone]   Fax:  [phone]  Physical Therapy Treatment  Patient Details  Name: Joanna Willis MRN: [id number] Date of Birth: [birth year] Referring Provider: Theresa Duty, PA-C for Joanna Arabian, MD   Encounter Date: 03/18/2018  PT End of Session - 03/18/18 1101    Visit Number  22    Number of Visits  30    Date for PT Re-Evaluation  04/15/18    Authorization Type  [facility]    PT Start Time  1101    PT Stop Time  1207    PT Time Calculation (min)  66 min    Activity Tolerance  Patient tolerated treatment well    Behavior During Therapy  [facility] for tasks assessed/performed       Past Medical History:  Diagnosis Date  . Arthritis   . Depression   . GERD (gastroesophageal reflux disease)   . Hypercholesteremia   . PONV (postoperative nausea and vomiting)   . Postmenopausal   . Wears glasses     Past Surgical History:  Procedure Laterality Date  . ABDOMINAL HYSTERECTOMY  2005   did not remove ovaries  . CESAREAN SECTION  2000  . COLONOSCOPY    . CONVERSION TO TOTAL KNEE Left 01/13/2018   Procedure: Revision of Left knee unicompartmental arthroplasty to total knee arthroplasty;  Surgeon: Joanna Arabian, MD;  [facility];  Service: Orthopedics;  Laterality: Left;  . FOOT ARTHRODESIS  1995   right  . KNEE ARTHROSCOPY WITH MEDIAL MENISECTOMY Left 05/17/2014   Procedure: LEFT ARTHROSCOPY KNEE WITH PARTIAL MEDIAL MENISECTOMY, CHONDROPLASTY OF LATERAL FEMORAL CONDYLE;  Surgeon: Alta Corning, MD;  Location: [place];  Service: Orthopedics;  Laterality: Left;  . PARTIAL KNEE ARTHROPLASTY Left 01/21/2017   Procedure: UNICOMPARTMENTAL LEFT KNEE;  Surgeon: Joanna Arabian, MD;  [facility];  Service: Orthopedics;  Laterality: Left;  . Revision left knee     Dr. Wynelle Link 01-13-18    There were no vitals filed for this  visit.  Subjective Assessment - 03/18/18 1112    Subjective  Pt reporting increased soreness since lasy therapy session. Had increased incidence of shooting pain down L anterior shin last night. Saw MD on Tuesday - states MD pleased with progress and no longer concerned about need for manipulation. Wants her to continue PT 2x/wk unitil next appt on 04/06/18. May be going back to work as of 04/08/18.    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  Yes    Pain Score  5     Pain Location  Knee    Pain Orientation  Left    Pain Descriptors / Indicators  Aching    Pain Type  Acute pain;Surgical pain    Pain Radiating Towards  brief shooting pain down anterior shin 3-4x/wk    Pain Onset  More than a month ago    Pain Frequency  Intermittent         [facility] PT Assessment - 03/18/18 1101      Assessment   Next MD Visit  04/06/18                   [facility] Adult PT Treatment/Exercise - 03/18/18 1101      Exercises   Exercises  Knee/Hip      Knee/Hip Exercises:  Aerobic   Recumbent Bike  L1 x 6 min      Knee/Hip Exercises: Standing   Hip Flexion  Right;Left;10 reps;Knee straight;Stengthening    Hip Flexion Limitations  green TB at ankle, 1 pole A    Hip ADduction  Right;Left;10 reps;Strengthening    Hip ADduction Limitations  green TB at ankle, 1 pole A    Hip Abduction  Right;Left;10 reps;Knee straight    Abduction Limitations  green TB at ankle, 1 pole A    Hip Extension  Right;Left;Knee straight;Stengthening    Extension Limitations  green TB at ankle, 1 pole A    Wall Squat  15 reps;3 seconds    Wall Squat Limitations  on orange Pball - verbal, tactile & visual cues with mirror feeback to avoid hip shift to R      Knee/Hip Exercises: Supine   Bridges  Both;5 reps;2 sets    Bridges Limitations  straight leg with heels on peanut ball - initially c/o L posterior knee pain - resolved after manual TPR to L lateral gastroc     Other Supine Knee/Hip Exercises  HS curl + bridge with heels on peanut ball x10      Modalities   Modalities  Vasopneumatic      Vasopneumatic   Number Minutes Vasopneumatic   15 minutes    Vasopnuematic Location   Knee    Vasopneumatic Pressure  High    Vasopneumatic Temperature   Lowest      Manual Therapy   Manual Therapy  Soft tissue mobilization;Myofascial release    Manual therapy comments  hooklying    Soft tissue mobilization  L anterior tibialis & gastrocs    Myofascial Release  manual TPR to L proximal anterior tibialis (replicated shooting pain in lower leg) & proximal lateral gastrocs (resolved posterior knee pain with straight leg bridges)               PT Short Term Goals - 02/23/18 0845      PT SHORT TERM GOAL #1   Title  Pt will be independent with initial HEP    Status  Achieved      PT SHORT TERM GOAL #2   Title  Pt will have 90 degrees of L knee AROM flexion    Status  Achieved        PT Long Term Goals - 03/12/18 0912      PT LONG TERM GOAL #1   Title  Pt to be independent with advanced HEP    Status  Partially Met      PT LONG TERM GOAL #2   Title  Patient to demonstrate 0-120 degrees of AROM at L knee to improve gait and abilty to perform ADLs independently    Status  On-going      PT LONG TERM GOAL #3   Title  Patient to report ability to take care of animals and garden independently with no increased pain.     Status  Partially Met      PT LONG TERM GOAL #4   Title  Pt to report pain at worst 4/10 with activity     Status  On-going      PT LONG TERM GOAL #5   Title  Pt will demonstrate normal gait pattern with LRAD in order to improve functional mobility and decrease fall risk    Status  On-going      PT LONG TERM GOAL #6   Title  Pt will have  LE strength globally of 4+/5 to improve functional mobility and perform ADLs and job-related activities safely    Status  Partially Met      PT LONG TERM GOAL #7   Title  Pt will  ascend/descend stairs reciprocally with good gait pattern to allow access to all levels of new home    Status  Achieved            Plan - 03/18/18 1159    Clinical Impression Statement  [name] reporting MD pleased with progress since last visit and no longer feels that she will require a manipulation. Therapeutic exercises focusing on continued proximal LE weakness in hips and hamstrings. Continued abnormal muscle tension in distal R LE - TP in proximal anterior tibialis resulting in shooting pain down front of shin and TP/taut bands in lateral gastroc limiting tolerance for striaght leg bridges - improvement in both noted following manual therapy.     Rehab Potential  Good    PT Treatment/Interventions  ADLs/Self Care Home Management;Cryotherapy;Electrical Stimulation;Iontophoresis 61m/ml Dexamethasone;Moist Heat;Ultrasound;DME Instruction;Gait training;Stair training;Functional mobility training;Therapeutic activities;Therapeutic exercise;Balance training;Neuromuscular re-education;Patient/family education;Manual techniques;Scar mobilization;Passive range of motion;Dry needling;Taping;Vasopneumatic Device    Consulted and Agree with Plan of Care  Patient       Patient will benefit from skilled therapeutic intervention in order to improve the following deficits and impairments:  Abnormal gait, Decreased skin integrity, Hypomobility, Impaired sensation, Increased edema, Decreased activity tolerance, Decreased strength, Increased fascial restricitons, Pain, Decreased mobility, Difficulty walking, Increased muscle spasms, Decreased range of motion, Improper body mechanics, Impaired flexibility, Decreased balance  Visit Diagnosis: Acute pain of left knee  Difficulty in walking, not elsewhere classified  Other symptoms and signs involving the musculoskeletal system  Stiffness of left knee, not elsewhere classified  Muscle weakness (generalized)  Other abnormalities of gait and  mobility     Problem List Patient Active Problem List   Diagnosis Date Noted  . S/P left unicompartmental knee replacement 01/13/2018  . OA (osteoarthritis) of knee 01/21/2017  . Left knee pain 04/19/2014  . BRONCHITIS, ACUTE 07/21/2010    JPercival Spanish PT, MPT 03/18/2018, 12:17 PM  [facility] [place] [address]Phone: [phone]  Fax:  [phone] Name: Joanna DIPIERROMRN: [id number] of Birth: 123-Jul-1957

## 2018-03-22 ENCOUNTER — Ambulatory Visit: Payer: 59

## 2018-03-23 ENCOUNTER — Ambulatory Visit: Payer: 59 | Attending: Student

## 2018-03-23 DIAGNOSIS — R2689 Other abnormalities of gait and mobility: Secondary | ICD-10-CM | POA: Insufficient documentation

## 2018-03-23 DIAGNOSIS — R262 Difficulty in walking, not elsewhere classified: Secondary | ICD-10-CM | POA: Diagnosis not present

## 2018-03-23 DIAGNOSIS — R29898 Other symptoms and signs involving the musculoskeletal system: Secondary | ICD-10-CM | POA: Diagnosis not present

## 2018-03-23 DIAGNOSIS — M25562 Pain in left knee: Secondary | ICD-10-CM | POA: Diagnosis not present

## 2018-03-23 DIAGNOSIS — M6281 Muscle weakness (generalized): Secondary | ICD-10-CM | POA: Diagnosis not present

## 2018-03-23 DIAGNOSIS — M25662 Stiffness of left knee, not elsewhere classified: Secondary | ICD-10-CM | POA: Insufficient documentation

## 2018-03-23 NOTE — Therapy (Signed)
[facility] [address]  [place], [place], [address] Phone: [phone]   Fax:  [phone]  Physical Therapy Treatment  Patient Details  Name: Joanna Willis MRN: [id number] Date of Birth: 23-Sep-1955 Referring Provider (PT): Theresa Duty, PA-C for [name], MD   Encounter Date: 03/23/2018  PT End of Session - 03/23/18 0821    Visit Number  23    Number of Visits  30    Date for PT Re-Evaluation  04/15/18    Authorization Type  [facility]    PT Start Time  0803    PT Stop Time  0859    PT Time Calculation (min)  56 min    Activity Tolerance  Patient tolerated treatment well    Behavior During Therapy  [facility] for tasks assessed/performed       Past Medical History:  Diagnosis Date  . Arthritis   . Depression   . GERD (gastroesophageal reflux disease)   . Hypercholesteremia   . PONV (postoperative nausea and vomiting)   . Postmenopausal   . Wears glasses     Past Surgical History:  Procedure Laterality Date  . ABDOMINAL HYSTERECTOMY  2005   did not remove ovaries  . CESAREAN SECTION  2000  . COLONOSCOPY    . CONVERSION TO TOTAL KNEE Left 01/13/2018   Procedure: Revision of Left knee unicompartmental arthroplasty to total knee arthroplasty;  Surgeon: [name], MD;  Location: [facility];  Service: Orthopedics;  Laterality: Left;  . FOOT ARTHRODESIS  1995   right  . KNEE ARTHROSCOPY WITH MEDIAL MENISECTOMY Left 05/17/2014   Procedure: LEFT ARTHROSCOPY KNEE WITH PARTIAL MEDIAL MENISECTOMY, CHONDROPLASTY OF LATERAL FEMORAL CONDYLE;  Surgeon: Alta Corning, MD;  Location: [place];  Service: Orthopedics;  Laterality: Left;  . PARTIAL KNEE ARTHROPLASTY Left 01/21/2017   Procedure: UNICOMPARTMENTAL LEFT KNEE;  Surgeon: [name], MD;  [facility];  Service: Orthopedics;  Laterality: Left;  . Revision left knee     Dr. Wynelle Link 01-13-18    There were no vitals filed for this  visit.  Subjective Assessment - 03/23/18 0806    Subjective  Pt. noting she drove for a six hour trip over weekend and felt fine.      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  No/denies    Pain Score  0-No pain    Multiple Pain Sites  No                       [facility] Adult PT Treatment/Exercise - 03/23/18 0823      Knee/Hip Exercises: Stretches   Quad Stretch  2 reps;Left;60 seconds    Quad Stretch Limitations  prone with strap     Gastroc Stretch  Left;60 seconds;2 reps    Gastroc Stretch Limitations  + HS with strap; dorsiflexion rocker    supine      Knee/Hip Exercises: Aerobic   Recumbent Bike  L1 x 6 min      Knee/Hip Exercises: Machines for Strengthening   Cybex Knee Flexion  B con/L ecc 20 x 10 reps       Knee/Hip Exercises: Standing   Heel Raises  15 reps;Both;Left    Heel Raises Limitations  B Con/L ecc    Forward Lunges  Right;Left;10 reps;3 seconds    Forward Lunges Limitations  counter  Forward Step Up  Left;15 reps;Step Height: 8";Hand Hold: 1;1 set    Step Down  Left;15 reps;Step Height: 6";Hand Hold: 2    Step Down Limitations  2 light UE support on machine     Functional Squat  15 reps;5 seconds    Functional Squat Limitations  TRX + heel raise at bottom of movement       Vasopneumatic   Number Minutes Vasopneumatic   15 minutes    Vasopnuematic Location   Knee    Vasopneumatic Pressure  High    Vasopneumatic Temperature   Lowest      Manual Therapy   Manual Therapy  Soft tissue mobilization;Joint mobilization    Soft tissue mobilization  L anterior tibialis - remains ttp in proximal     Myofascial Release  manual TPR to proximal anterior tib               PT Short Term Goals - 02/23/18 0845      PT SHORT TERM GOAL #1   Title  Pt will be independent with initial HEP    Status  Achieved      PT SHORT TERM GOAL #2   Title  Pt will have 90 degrees of L knee AROM flexion     Status  Achieved        PT Long Term Goals - 03/12/18 0912      PT LONG TERM GOAL #1   Title  Pt to be independent with advanced HEP    Status  Partially Met      PT LONG TERM GOAL #2   Title  Patient to demonstrate 0-120 degrees of AROM at L knee to improve gait and abilty to perform ADLs independently    Status  On-going      PT LONG TERM GOAL #3   Title  Patient to report ability to take care of animals and garden independently with no increased pain.     Status  Partially Met      PT LONG TERM GOAL #4   Title  Pt to report pain at worst 4/10 with activity     Status  On-going      PT LONG TERM GOAL #5   Title  Pt will demonstrate normal gait pattern with LRAD in order to improve functional mobility and decrease fall risk    Status  On-going      PT LONG TERM GOAL #6   Title  Pt will have LE strength globally of 4+/5 to improve functional mobility and perform ADLs and job-related activities safely    Status  Partially Met      PT LONG TERM GOAL #7   Title  Pt will ascend/descend stairs reciprocally with good gait pattern to allow access to all levels of new home    Status  Achieved            Plan - 03/23/18 0822    Clinical Impression Statement  Elika tolerated progression of stepping activities and machine strengthening well today.  Noted improvement in L knee pain with therex today and with visible improvement in flexion ROM with bike warmup despite not being formally measured today.  Ended visit with ice/compression to L knee to decrease post-exercise soreness and swelling.  Progressing well toward goals.      PT Treatment/Interventions  ADLs/Self Care Home Management;Cryotherapy;Electrical Stimulation;Iontophoresis 18m/ml Dexamethasone;Moist Heat;Ultrasound;DME Instruction;Gait training;Stair training;Functional mobility training;Therapeutic activities;Therapeutic exercise;Balance training;Neuromuscular re-education;Patient/family education;Manual  techniques;Scar mobilization;Passive range of motion;Dry needling;Taping;Vasopneumatic Device  Consulted and Agree with Plan of Care  Patient       Patient will benefit from skilled therapeutic intervention in order to improve the following deficits and impairments:  Abnormal gait, Decreased skin integrity, Hypomobility, Impaired sensation, Increased edema, Decreased activity tolerance, Decreased strength, Increased fascial restricitons, Pain, Decreased mobility, Difficulty walking, Increased muscle spasms, Decreased range of motion, Improper body mechanics, Impaired flexibility, Decreased balance  Visit Diagnosis: Acute pain of left knee  Difficulty in walking, not elsewhere classified  Other symptoms and signs involving the musculoskeletal system  Stiffness of left knee, not elsewhere classified  Muscle weakness (generalized)  Other abnormalities of gait and mobility     Problem List Patient Active Problem List   Diagnosis Date Noted  . S/P left unicompartmental knee replacement 01/13/2018  . OA (osteoarthritis) of knee 01/21/2017  . Left knee pain 04/19/2014  . BRONCHITIS, ACUTE 07/21/2010    Bess Harvest, PTA 03/23/18 12:43 PM   [facility] [address]  [place], [place], [address] Phone: [phone]   Fax:  [phone]  Name: JOSELYN EDLING MRN: [id number] Date of Birth: 29-Apr-1956

## 2018-03-25 ENCOUNTER — Encounter: Payer: Self-pay | Admitting: Physical Therapy

## 2018-03-25 ENCOUNTER — Ambulatory Visit: Payer: 59 | Admitting: Physical Therapy

## 2018-03-25 DIAGNOSIS — M25662 Stiffness of left knee, not elsewhere classified: Secondary | ICD-10-CM | POA: Diagnosis not present

## 2018-03-25 DIAGNOSIS — R262 Difficulty in walking, not elsewhere classified: Secondary | ICD-10-CM

## 2018-03-25 DIAGNOSIS — M6281 Muscle weakness (generalized): Secondary | ICD-10-CM | POA: Diagnosis not present

## 2018-03-25 DIAGNOSIS — M25562 Pain in left knee: Secondary | ICD-10-CM

## 2018-03-25 DIAGNOSIS — R29898 Other symptoms and signs involving the musculoskeletal system: Secondary | ICD-10-CM | POA: Diagnosis not present

## 2018-03-25 DIAGNOSIS — R2689 Other abnormalities of gait and mobility: Secondary | ICD-10-CM | POA: Diagnosis not present

## 2018-03-25 NOTE — Therapy (Signed)
[facility] [address]  [place], [place], [address] Phone: [phone]   Fax:  [phone]  Physical Therapy Treatment  Patient Details  Name: Joanna Willis MRN: [id number] Date of Birth: 1955/12/25 Referring Provider (PT): Theresa Duty, PA-C for Joanna Arabian, MD   Encounter Date: 03/25/2018  PT End of Session - 03/25/18 1104    Visit Number  24    Number of Visits  30    Date for PT Re-Evaluation  04/15/18    Authorization Type  [facility]    PT Start Time  1104    PT Stop Time  1144    PT Time Calculation (min)  40 min    Activity Tolerance  Patient tolerated treatment well    Behavior During Therapy  [facility] for tasks assessed/performed       Past Medical History:  Diagnosis Date  . Arthritis   . Depression   . GERD (gastroesophageal reflux disease)   . Hypercholesteremia   . PONV (postoperative nausea and vomiting)   . Postmenopausal   . Wears glasses     Past Surgical History:  Procedure Laterality Date  . ABDOMINAL HYSTERECTOMY  2005   did not remove ovaries  . CESAREAN SECTION  2000  . COLONOSCOPY    . CONVERSION TO TOTAL KNEE Left 01/13/2018   Procedure: Revision of Left knee unicompartmental arthroplasty to total knee arthroplasty;  Surgeon: Joanna Arabian, MD;  [facility];  Service: Orthopedics;  Laterality: Left;  . FOOT ARTHRODESIS  1995   right  . KNEE ARTHROSCOPY WITH MEDIAL MENISECTOMY Left 05/17/2014   Procedure: LEFT ARTHROSCOPY KNEE WITH PARTIAL MEDIAL MENISECTOMY, CHONDROPLASTY OF LATERAL FEMORAL CONDYLE;  Surgeon: Alta Corning, MD;  Location: [place];  Service: Orthopedics;  Laterality: Left;  . PARTIAL KNEE ARTHROPLASTY Left 01/21/2017   Procedure: UNICOMPARTMENTAL LEFT KNEE;  Surgeon: Joanna Arabian, MD;  [facility];  Service: Orthopedics;  Laterality: Left;  . Revision left knee     Dr. Wynelle Link 01-13-18    There were no vitals filed for this  visit.  Subjective Assessment - 03/25/18 1107    Subjective  Pt reporting continued muscle soreness in lower leg but less of the shooting pain. States she has been working on muscle release with deep pressure, but not noting much improvement.    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  Yes    Pain Score  --   2-3/10   Pain Location  Leg    Pain Orientation  Left;Lower                       [facility] Adult PT Treatment/Exercise - 03/25/18 1104      Exercises   Exercises  Knee/Hip      Knee/Hip Exercises: Aerobic   Recumbent Bike  L1 x 6 min      Manual Therapy   Manual Therapy  Soft tissue mobilization;Myofascial release;Taping    Soft tissue mobilization  L proximal anterior tibialis & gastrocs    Myofascial Release  manual TPR to L proximal anterior tibialis & proximal lateral gastrocs    Kinesiotex  Facilitate Muscle;Inhibit Muscle      Kinesiotix   Inhibit Muscle   L gastroc/soleus - 30% over soleus & medial gastroc, 30-50% over lateral gastroc    Facilitate Muscle   L anterior tibialis  50%       Trigger Point Dry Needling - 03/25/18 1104    Consent Given?  Yes    Muscles Treated Lower Body  Tibialis anterior    Tibialis Anterior Response  Twitch response elicited;Palpable increased muscle length   Left            PT Short Term Goals - 02/23/18 0845      PT SHORT TERM GOAL #1   Title  Pt will be independent with initial HEP    Status  Achieved      PT SHORT TERM GOAL #2   Title  Pt will have 90 degrees of L knee AROM flexion    Status  Achieved        PT Long Term Goals - 03/12/18 0912      PT LONG TERM GOAL #1   Title  Pt to be independent with advanced HEP    Status  Partially Met      PT LONG TERM GOAL #2   Title  Patient to demonstrate 0-120 degrees of AROM at L knee to improve gait and abilty to perform ADLs independently    Status  On-going      PT LONG TERM GOAL #3    Title  Patient to report ability to take care of animals and garden independently with no increased pain.     Status  Partially Met      PT LONG TERM GOAL #4   Title  Pt to report pain at worst 4/10 with activity     Status  On-going      PT LONG TERM GOAL #5   Title  Pt will demonstrate normal gait pattern with LRAD in order to improve functional mobility and decrease fall risk    Status  On-going      PT LONG TERM GOAL #6   Title  Pt will have LE strength globally of 4+/5 to improve functional mobility and perform ADLs and job-related activities safely    Status  Partially Met      PT LONG TERM GOAL #7   Title  Pt will ascend/descend stairs reciprocally with good gait pattern to allow access to all levels of new home    Status  Achieved            Plan - 03/25/18 1110    Clinical Impression Statement  Joanna Willis noting ongoing tightness/tenderness in lower leg limiting walking tolerance and feels like she has not been able to get good relief with manual STM, therefore requested to try DN for this. Positive twitch response elicitied in L anterior tibialis with decreased muscle tension and "nervy" pain in lower leg following this. Kinesiotape applied to L anterior tibialis for facilitation of DF while allowing for further relaxation of muscle tension. Taut band still present in L proximal lateral gastroc but not as pronounced as in earlier visits, therefore addressed only with manual STM/TPR following by taping, with DN deferred in this area at pt request.    Rehab Potential  Good    PT Treatment/Interventions  ADLs/Self Care Home Management;Cryotherapy;Electrical Stimulation;Iontophoresis 76m/ml Dexamethasone;Moist Heat;Ultrasound;DME Instruction;Gait training;Stair training;Functional mobility training;Therapeutic activities;Therapeutic exercise;Balance training;Neuromuscular re-education;Patient/family education;Manual techniques;Scar mobilization;Passive range of motion;Dry  needling;Taping;Vasopneumatic Device    Consulted and Agree with Plan of Care  Patient       Patient will benefit from skilled therapeutic intervention in order to improve the following deficits and impairments:  Abnormal gait, Decreased skin integrity, Hypomobility, Impaired sensation, Increased edema, Decreased  activity tolerance, Decreased strength, Increased fascial restricitons, Pain, Decreased mobility, Difficulty walking, Increased muscle spasms, Decreased range of motion, Improper body mechanics, Impaired flexibility, Decreased balance  Visit Diagnosis: Acute pain of left knee  Difficulty in walking, not elsewhere classified  Other symptoms and signs involving the musculoskeletal system  Stiffness of left knee, not elsewhere classified  Muscle weakness (generalized)  Other abnormalities of gait and mobility     Problem List Patient Active Problem List   Diagnosis Date Noted  . S/P left unicompartmental knee replacement 01/13/2018  . OA (osteoarthritis) of knee 01/21/2017  . Left knee pain 04/19/2014  . BRONCHITIS, ACUTE 07/21/2010    Percival Spanish, PT, MPT 03/25/2018, 12:18 PM  [facility] [address]  [place], [place], [address] Phone: [phone]   Fax:  [phone]  Name: Joanna Willis MRN: [id number] Date of Birth: [birth year]

## 2018-03-30 ENCOUNTER — Ambulatory Visit: Payer: 59 | Admitting: Physical Therapy

## 2018-03-30 ENCOUNTER — Encounter: Payer: Self-pay | Admitting: Physical Therapy

## 2018-03-30 DIAGNOSIS — R29898 Other symptoms and signs involving the musculoskeletal system: Secondary | ICD-10-CM

## 2018-03-30 DIAGNOSIS — M6281 Muscle weakness (generalized): Secondary | ICD-10-CM | POA: Diagnosis not present

## 2018-03-30 DIAGNOSIS — R2689 Other abnormalities of gait and mobility: Secondary | ICD-10-CM | POA: Diagnosis not present

## 2018-03-30 DIAGNOSIS — M25562 Pain in left knee: Secondary | ICD-10-CM | POA: Diagnosis not present

## 2018-03-30 DIAGNOSIS — M25662 Stiffness of left knee, not elsewhere classified: Secondary | ICD-10-CM | POA: Diagnosis not present

## 2018-03-30 DIAGNOSIS — R262 Difficulty in walking, not elsewhere classified: Secondary | ICD-10-CM | POA: Diagnosis not present

## 2018-03-30 NOTE — Therapy (Signed)
[facility] [address]  [place], [place], [address] Phone: [phone]   Fax:  [phone]  Physical Therapy Treatment  Patient Details  Name: Joanna Willis MRN: [id number] Date of Birth: 07-15-1955 Referring Provider (PT): Theresa Duty, PA-C for Joanna Arabian, MD   Encounter Date: 03/30/2018  PT End of Session - 03/30/18 0848    Visit Number  24    Number of Visits  30    Date for PT Re-Evaluation  04/15/18    Authorization Type  [facility]    PT Start Time  0848    PT Stop Time  0930    PT Time Calculation (min)  42 min    Activity Tolerance  Patient tolerated treatment well    Behavior During Therapy  [facility] for tasks assessed/performed       Past Medical History:  Diagnosis Date  . Arthritis   . Depression   . GERD (gastroesophageal reflux disease)   . Hypercholesteremia   . PONV (postoperative nausea and vomiting)   . Postmenopausal   . Wears glasses     Past Surgical History:  Procedure Laterality Date  . ABDOMINAL HYSTERECTOMY  2005   did not remove ovaries  . CESAREAN SECTION  2000  . COLONOSCOPY    . CONVERSION TO TOTAL KNEE Left 01/13/2018   Procedure: Revision of Left knee unicompartmental arthroplasty to total knee arthroplasty;  Surgeon: Joanna Arabian, MD;  [facility];  Service: Orthopedics;  Laterality: Left;  . FOOT ARTHRODESIS  1995   right  . KNEE ARTHROSCOPY WITH MEDIAL MENISECTOMY Left 05/17/2014   Procedure: LEFT ARTHROSCOPY KNEE WITH PARTIAL MEDIAL MENISECTOMY, CHONDROPLASTY OF LATERAL FEMORAL CONDYLE;  Surgeon: Alta Corning, MD;  Location: [place];  Service: Orthopedics;  Laterality: Left;  . PARTIAL KNEE ARTHROPLASTY Left 01/21/2017   Procedure: UNICOMPARTMENTAL LEFT KNEE;  Surgeon: Joanna Arabian, MD;  [facility];  Service: Orthopedics;  Laterality: Left;  . Revision left knee     Dr. Wynelle Link 01-13-18    There were no vitals filed for this  visit.  Subjective Assessment - 03/30/18 0852    Subjective  Pt reports she had a rough weekend - not so much knee pain but more muscle pain t/o hamstrings & lower leg.    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  Yes    Pain Score  3     Pain Location  Leg    Pain Orientation  Left;Lower    Pain Descriptors / Indicators  Shooting    Pain Type  Acute pain;Surgical pain    Pain Radiating Towards  brief shooting pain from L lateral gastroc & HS muscle belly down back of leg    Pain Frequency  Intermittent                       [facility] Adult PT Treatment/Exercise - 03/30/18 0848      Exercises   Exercises  Knee/Hip      Knee/Hip Exercises: Aerobic   Recumbent Bike  L1 x 6 min      Manual Therapy   Manual Therapy  Soft tissue mobilization;Myofascial release;Taping    Manual therapy comments  prone    Soft tissue mobilization  L popliteus & gastrocs    Myofascial Release  manual TPR to L popliteus & proximal lateral gastroc  Kinesiotex  Inhibit Muscle      Kinesiotix   Inhibit Muscle   L popliteus - 30% stretch along musle belly; L gastroc/soleus - 30% over soleus & medial gastroc, 30-50% over lateral gastroc       Trigger Point Dry Needling - 03/30/18 0848    Consent Given?  Yes    Muscles Treated Lower Body  Gastrocnemius    Gastrocnemius Response  Twitch response elicited;Palpable increased muscle length   Left lateral gastroc            PT Short Term Goals - 02/23/18 0845      PT SHORT TERM GOAL #1   Title  Pt will be independent with initial HEP    Status  Achieved      PT SHORT TERM GOAL #2   Title  Pt will have 90 degrees of L knee AROM flexion    Status  Achieved        PT Long Term Goals - 03/12/18 0912      PT LONG TERM GOAL #1   Title  Pt to be independent with advanced HEP    Status  Partially Met      PT LONG TERM GOAL #2   Title  Patient to demonstrate 0-120 degrees of  AROM at L knee to improve gait and abilty to perform ADLs independently    Status  On-going      PT LONG TERM GOAL #3   Title  Patient to report ability to take care of animals and garden independently with no increased pain.     Status  Partially Met      PT LONG TERM GOAL #4   Title  Pt to report pain at worst 4/10 with activity     Status  On-going      PT LONG TERM GOAL #5   Title  Pt will demonstrate normal gait pattern with LRAD in order to improve functional mobility and decrease fall risk    Status  On-going      PT LONG TERM GOAL #6   Title  Pt will have LE strength globally of 4+/5 to improve functional mobility and perform ADLs and job-related activities safely    Status  Partially Met      PT LONG TERM GOAL #7   Title  Pt will ascend/descend stairs reciprocally with good gait pattern to allow access to all levels of new home    Status  Achieved            Plan - 03/30/18 0930    Clinical Impression Statement  Emsley noting improvement in shooting pain in anterior distal LE followin DN of anterior tibialis last session, but still feels like muscular tigthness and associated pain in posterior L LE is her greatest limiting factor. Tenderness noted at origin of L popliteus as well as tenderness/taut bands in lateral gastroc which were addressed with DN to gastroc and manual therapy followed by taping to both muscles. Pending response to DN/manual therapy, will plan to return focus to ROm and strengthening on next visit.    Rehab Potential  Good    PT Treatment/Interventions  ADLs/Self Care Home Management;Cryotherapy;Electrical Stimulation;Iontophoresis 30m/ml Dexamethasone;Moist Heat;Ultrasound;DME Instruction;Gait training;Stair training;Functional mobility training;Therapeutic activities;Therapeutic exercise;Balance training;Neuromuscular re-education;Patient/family education;Manual techniques;Scar mobilization;Passive range of motion;Dry needling;Taping;Vasopneumatic Device     Consulted and Agree with Plan of Care  Patient       Patient will benefit from skilled therapeutic intervention in order to improve the following deficits  and impairments:  Abnormal gait, Decreased skin integrity, Hypomobility, Impaired sensation, Increased edema, Decreased activity tolerance, Decreased strength, Increased fascial restricitons, Pain, Decreased mobility, Difficulty walking, Increased muscle spasms, Decreased range of motion, Improper body mechanics, Impaired flexibility, Decreased balance  Visit Diagnosis: Acute pain of left knee  Difficulty in walking, not elsewhere classified  Other symptoms and signs involving the musculoskeletal system  Stiffness of left knee, not elsewhere classified  Muscle weakness (generalized)  Other abnormalities of gait and mobility     Problem List Patient Active Problem List   Diagnosis Date Noted  . S/P left unicompartmental knee replacement 01/13/2018  . OA (osteoarthritis) of knee 01/21/2017  . Left knee pain 04/19/2014  . BRONCHITIS, ACUTE 07/21/2010    Percival Spanish, PT, MPT 03/30/2018, 1:08 PM  [facility] [address]  [place], [place], [address] Phone: [phone]   Fax:  [phone]  Name: Joanna Willis MRN: [id number] Date of Birth: 08-16-1955

## 2018-04-02 ENCOUNTER — Encounter: Payer: Self-pay | Admitting: Physical Therapy

## 2018-04-02 ENCOUNTER — Ambulatory Visit: Payer: 59 | Admitting: Physical Therapy

## 2018-04-02 DIAGNOSIS — M25662 Stiffness of left knee, not elsewhere classified: Secondary | ICD-10-CM

## 2018-04-02 DIAGNOSIS — M25562 Pain in left knee: Secondary | ICD-10-CM | POA: Diagnosis not present

## 2018-04-02 DIAGNOSIS — R29898 Other symptoms and signs involving the musculoskeletal system: Secondary | ICD-10-CM | POA: Diagnosis not present

## 2018-04-02 DIAGNOSIS — R262 Difficulty in walking, not elsewhere classified: Secondary | ICD-10-CM | POA: Diagnosis not present

## 2018-04-02 DIAGNOSIS — M6281 Muscle weakness (generalized): Secondary | ICD-10-CM

## 2018-04-02 DIAGNOSIS — R2689 Other abnormalities of gait and mobility: Secondary | ICD-10-CM | POA: Diagnosis not present

## 2018-04-02 NOTE — Therapy (Signed)
[facility] [address]  [place], [place], [address] Phone: [phone]   Fax:  [phone]  Physical Therapy Treatment  Patient Details  Name: Joanna Willis MRN: [id number] Date of Birth: [birth year] Referring Provider (PT): Theresa Duty, PA-C for Joanna Arabian, MD   Encounter Date: 04/02/2018  PT End of Session - 04/02/18 0848    Visit Number  25    Number of Visits  30    Date for PT Re-Evaluation  04/15/18    Authorization Type  [facility]    PT Start Time  0848    PT Stop Time  0948    PT Time Calculation (min)  60 min    Activity Tolerance  Patient tolerated treatment well    Behavior During Therapy  [facility] for tasks assessed/performed       Past Medical History:  Diagnosis Date  . Arthritis   . Depression   . GERD (gastroesophageal reflux disease)   . Hypercholesteremia   . PONV (postoperative nausea and vomiting)   . Postmenopausal   . Wears glasses     Past Surgical History:  Procedure Laterality Date  . ABDOMINAL HYSTERECTOMY  2005   did not remove ovaries  . CESAREAN SECTION  2000  . COLONOSCOPY    . CONVERSION TO TOTAL KNEE Left 01/13/2018   Procedure: Revision of Left knee unicompartmental arthroplasty to total knee arthroplasty;  Surgeon: Joanna Arabian, MD;  [facility];  Service: Orthopedics;  Laterality: Left;  . FOOT ARTHRODESIS  1995   right  . KNEE ARTHROSCOPY WITH MEDIAL MENISECTOMY Left 05/17/2014   Procedure: LEFT ARTHROSCOPY KNEE WITH PARTIAL MEDIAL MENISECTOMY, CHONDROPLASTY OF LATERAL FEMORAL CONDYLE;  Surgeon: Alta Corning, MD;  Location: [place];  Service: Orthopedics;  Laterality: Left;  . PARTIAL KNEE ARTHROPLASTY Left 01/21/2017   Procedure: UNICOMPARTMENTAL LEFT KNEE;  Surgeon: Joanna Arabian, MD;  [facility];  Service: Orthopedics;  Laterality: Left;  . Revision left knee     Dr. Wynelle Link 01-13-18    There were no vitals filed for this  visit.  Subjective Assessment - 04/02/18 0851    Subjective  Pt reporting muscle soreness & "nervy" pain 50% improved with recent manual therpay & DN. Also has found good relief with heat for sore muscles.    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  Yes    Pain Score  5     Pain Location  Knee    Pain Orientation  Left    Pain Descriptors / Indicators  Aching;Tightness    Pain Type  Acute pain;Surgical pain    Pain Frequency  Intermittent         [facility] PT Assessment - 04/02/18 0848      AROM   Left Knee Extension  5    Left Knee Flexion  101                   [facility] Adult PT Treatment/Exercise - 04/02/18 0848      Exercises   Exercises  Knee/Hip      Knee/Hip Exercises: Aerobic   Recumbent Bike  L1 x 6 min      Knee/Hip Exercises: Standing   Hip Flexion  Right;Left;15 reps;Knee bent;Stengthening    Hip Flexion Limitations  fwd step-up to 9" step + opp knee drive with green TB at ankle; 2 pole  A    Forward Lunges  Right;Left;15 reps;3 seconds    Forward Lunges Limitations  single UE support    Hip ADduction  Right;Left;10 reps;Strengthening    Hip ADduction Limitations  SLS on blue foam oval + green TB at opp ankle, 2 pole A    Hip Abduction  Right;Left;10 reps;Knee straight    Abduction Limitations  SLS on blue foam oval + green TB at opp ankle, 2 pole A    Hip Extension  Right;Left;Knee straight;Stengthening    Extension Limitations  SLS on blue foam oval + green TB at opp ankle, 2 pole A    Step Down  Left;10 reps;Step Height: 8";Hand Hold: 1    Step Down Limitations  lat & fwd step-down with heel touch    Functional Squat  15 reps;5 seconds    Functional Squat Limitations  triple extension on TRX    Rocker Board  3 minutes    Rocker Board Limitations  static balance 2 x 30 sec; lateral & heel-toe rock x 15 each    SLS with Vectors  L SLS on blue foam oval with R toe tap to 4 balance pebbles x10; 1  pole A      Knee/Hip Exercises: Prone   Prone Knee Hang  1 minute      Modalities   Modalities  Vasopneumatic      Vasopneumatic   Number Minutes Vasopneumatic   15 minutes    Vasopnuematic Location   Knee    Vasopneumatic Pressure  High    Vasopneumatic Temperature   Lowest      Manual Therapy   Manual Therapy  Joint mobilization    Manual therapy comments  hooklying    Joint Mobilization  L inferiror patellar mobs + tibiofemoral A/P & P/A mobs for increased flexion ROM               PT Short Term Goals - 02/23/18 0845      PT SHORT TERM GOAL #1   Title  Pt will be independent with initial HEP    Status  Achieved      PT SHORT TERM GOAL #2   Title  Pt will have 90 degrees of L knee AROM flexion    Status  Achieved        PT Long Term Goals - 03/12/18 0912      PT LONG TERM GOAL #1   Title  Pt to be independent with advanced HEP    Status  Partially Met      PT LONG TERM GOAL #2   Title  Patient to demonstrate 0-120 degrees of AROM at L knee to improve gait and abilty to perform ADLs independently    Status  On-going      PT LONG TERM GOAL #3   Title  Patient to report ability to take care of animals and garden independently with no increased pain.     Status  Partially Met      PT LONG TERM GOAL #4   Title  Pt to report pain at worst 4/10 with activity     Status  On-going      PT LONG TERM GOAL #5   Title  Pt will demonstrate normal gait pattern with LRAD in order to improve functional mobility and decrease fall risk    Status  On-going      PT LONG TERM GOAL #6   Title  Pt will have LE strength globally of 4+/5  to improve functional mobility and perform ADLs and job-related activities safely    Status  Partially Met      PT LONG TERM GOAL #7   Title  Pt will ascend/descend stairs reciprocally with good gait pattern to allow access to all levels of new home    Status  Achieved            Plan - 04/02/18 0859    Clinical Impression  Statement  [name] reporting ~50% improvement in muscle soreness and "nervy" shooting pain with recent manual therapy including DN. Some muscle soreness reported with initial rep(s) of a few exercises, but typically resolved w/in 1st few reps and pt able to complete sets w/o further pain. L knee flexion ROM continues to slowly improve but slight decline in extension noted today with pt feeling popliteus discomfort is creating the restriction. Reviewed static stretches to promote improved knee extension.    Rehab Potential  Good    PT Treatment/Interventions  ADLs/Self Care Home Management;Cryotherapy;Electrical Stimulation;Iontophoresis 86m/ml Dexamethasone;Moist Heat;Ultrasound;DME Instruction;Gait training;Stair training;Functional mobility training;Therapeutic activities;Therapeutic exercise;Balance training;Neuromuscular re-education;Patient/family education;Manual techniques;Scar mobilization;Passive range of motion;Dry needling;Taping;Vasopneumatic Device    PT Next Visit Plan  MD progress note    Consulted and Agree with Plan of Care  Patient       Patient will benefit from skilled therapeutic intervention in order to improve the following deficits and impairments:  Abnormal gait, Decreased skin integrity, Hypomobility, Impaired sensation, Increased edema, Decreased activity tolerance, Decreased strength, Increased fascial restricitons, Pain, Decreased mobility, Difficulty walking, Increased muscle spasms, Decreased range of motion, Improper body mechanics, Impaired flexibility, Decreased balance  Visit Diagnosis: Acute pain of left knee  Difficulty in walking, not elsewhere classified  Other symptoms and signs involving the musculoskeletal system  Stiffness of left knee, not elsewhere classified  Muscle weakness (generalized)  Other abnormalities of gait and mobility     Problem List Patient Active Problem List   Diagnosis Date Noted  . S/P left unicompartmental knee replacement  01/13/2018  . OA (osteoarthritis) of knee 01/21/2017  . Left knee pain 04/19/2014  . BRONCHITIS, ACUTE 07/21/2010    JPercival Spanish10/04/2018, 10:04 AM  [facility] [place] [address]Phone: [phone]  Fax:  [phone] Name: JTARISA PAOLAMRN: [id number] of Birth: 108/09/1955

## 2018-04-06 ENCOUNTER — Ambulatory Visit: Payer: 59

## 2018-04-06 DIAGNOSIS — R262 Difficulty in walking, not elsewhere classified: Secondary | ICD-10-CM

## 2018-04-06 DIAGNOSIS — M25562 Pain in left knee: Secondary | ICD-10-CM | POA: Diagnosis not present

## 2018-04-06 DIAGNOSIS — R29898 Other symptoms and signs involving the musculoskeletal system: Secondary | ICD-10-CM | POA: Diagnosis not present

## 2018-04-06 DIAGNOSIS — M25662 Stiffness of left knee, not elsewhere classified: Secondary | ICD-10-CM

## 2018-04-06 DIAGNOSIS — R2689 Other abnormalities of gait and mobility: Secondary | ICD-10-CM | POA: Diagnosis not present

## 2018-04-06 DIAGNOSIS — M6281 Muscle weakness (generalized): Secondary | ICD-10-CM | POA: Diagnosis not present

## 2018-04-06 NOTE — Therapy (Signed)
[facility] [address]  [place], [place], [address] Phone: [phone]   Fax:  [phone]  Physical Therapy Treatment  Patient Details  Name: Joanna Willis MRN: [id number] Date of Birth: 19-Apr-1956 Referring Provider (PT): Theresa Duty, PA-C for [name], MD   Encounter Date: 04/06/2018  PT End of Session - 04/06/18 0904    Visit Number  26    Number of Visits  30    Date for PT Re-Evaluation  04/15/18    Authorization Type  [facility]    PT Start Time  0847    PT Stop Time  0935    PT Time Calculation (min)  48 min    Activity Tolerance  Patient tolerated treatment well    Behavior During Therapy  [facility] for tasks assessed/performed       Past Medical History:  Diagnosis Date  . Arthritis   . Depression   . GERD (gastroesophageal reflux disease)   . Hypercholesteremia   . PONV (postoperative nausea and vomiting)   . Postmenopausal   . Wears glasses     Past Surgical History:  Procedure Laterality Date  . ABDOMINAL HYSTERECTOMY  2005   did not remove ovaries  . CESAREAN SECTION  2000  . COLONOSCOPY    . CONVERSION TO TOTAL KNEE Left 01/13/2018   Procedure: Revision of Left knee unicompartmental arthroplasty to total knee arthroplasty;  Surgeon: [name], MD;  [facility];  Service: Orthopedics;  Laterality: Left;  . FOOT ARTHRODESIS  1995   right  . KNEE ARTHROSCOPY WITH MEDIAL MENISECTOMY Left 05/17/2014   Procedure: LEFT ARTHROSCOPY KNEE WITH PARTIAL MEDIAL MENISECTOMY, CHONDROPLASTY OF LATERAL FEMORAL CONDYLE;  Surgeon: Alta Corning, MD;  Location: [place];  Service: Orthopedics;  Laterality: Left;  . PARTIAL KNEE ARTHROPLASTY Left 01/21/2017   Procedure: UNICOMPARTMENTAL LEFT KNEE;  Surgeon: [name], MD;  [facility];  Service: Orthopedics;  Laterality: Left;  . Revision left knee     Dr. Wynelle Link 01-13-18    There were no vitals filed for this  visit.  Subjective Assessment - 04/06/18 0902    Subjective  Pt. reporting some L lateral/posterior calf pain today which she feels only responded "a little" to DN.      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  Yes    Pain Score  1     Pain Location  Knee    Pain Orientation  Left    Pain Descriptors / Indicators  Aching   "thick"   Pain Type  Acute pain;Surgical pain    Pain Onset  More than a month ago    Pain Frequency  Intermittent    Aggravating Factors   knee flexion     Multiple Pain Sites  Yes    Pain Score  5    Pain Location  Calf    Pain Orientation  Left;Lateral    Pain Descriptors / Indicators  Throbbing;Shooting    Pain Type  Acute pain    Pain Radiating Towards  shooting pain down L lateral/posterior calf to ankle     Pain Onset  In the past 7 days    Pain Frequency  Intermittent         [facility] PT Assessment - 04/06/18 0915      AROM   Left Knee Extension  3  Left Knee Flexion  102      PROM   Right/Left Knee  Left    Left Knee Extension  2    Left Knee Flexion  107      Strength   Right/Left Hip  Right;Left    Right Hip Flexion  5/5    Right Hip Extension  4+/5    Right Hip ABduction  5/5    Right Hip ADduction  5/5    Left Hip Flexion  4+/5    Left Hip Extension  4+/5    Left Hip ABduction  5/5    Left Hip ADduction  4+/5    Right/Left Knee  Right;Left    Right Knee Flexion  5/5    Right Knee Extension  5/5    Left Knee Flexion  4+/5    Left Knee Extension  5/5                   [facility] Adult PT Treatment/Exercise - 04/06/18 0905      Knee/Hip Exercises: Stretches   Gastroc Stretch  Left;60 seconds;2 reps    Gastroc Stretch Limitations  standing and standing at [employer]      Knee/Hip Exercises: Aerobic   Recumbent Bike  L1 x 7 min      Knee/Hip Exercises: Machines for Strengthening   Cybex Knee Flexion  B con/L ecc 15 x 15 reps       Knee/Hip Exercises: Standing   Forward  Step Up  Left;15 reps;Step Height: 8";Hand Hold: 1;1 set    Forward Step Up Limitations  Working on eccentric control with step up and over/down      Manual Therapy   Manual Therapy  Joint mobilization    Manual therapy comments  hooklying    Joint Mobilization  L inferiror patellar mobs + tibiofemoral A/P & P/A mobs for increased flexion ROM    Soft tissue mobilization  L popliteus & gastrocs    Myofascial Release  Manual TPR to L                PT Short Term Goals - 02/23/18 0845      PT SHORT TERM GOAL #1   Title  Pt will be independent with initial HEP    Status  Achieved      PT SHORT TERM GOAL #2   Title  Pt will have 90 degrees of L knee AROM flexion    Status  Achieved        PT Long Term Goals - 04/06/18 0925      PT LONG TERM GOAL #1   Title  Pt to be independent with advanced HEP    Status  Partially Met      PT LONG TERM GOAL #2   Title  Patient to demonstrate 0-120 degrees of AROM at L knee to improve gait and abilty to perform ADLs independently    Status  On-going      PT LONG TERM GOAL #3   Title  Patient to report ability to take care of animals and garden independently with no increased pain.     Status  Partially Met      PT LONG TERM GOAL #4   Title  Pt to report pain at worst 4/10 with activity     Status  Achieved      PT LONG TERM GOAL #5   Title  Pt will demonstrate normal gait pattern with LRAD in order to improve functional mobility  and decrease fall risk    Status  On-going      PT LONG TERM GOAL #6   Title  Pt will have LE strength globally of 4+/5 to improve functional mobility and perform ADLs and job-related activities safely    Status  Achieved      PT LONG TERM GOAL #7   Title  Pt will ascend/descend stairs reciprocally with good gait pattern to allow access to all levels of new home    Status  Achieved            Plan - 04/06/18 0913    Clinical Impression Statement  Pt. making good progress with therapy.  Pt.  able to meet strength goal with MMT today and able to demo ROM progress AROM 3-102 dg, PROM 2-107 dg today.  Pt. primary concern today was remaining L lateral calf pain which she feels had limited response to DN last visit.  Addressed L calf pain with STM/TPR with some relief noted however pt. complaining of L calf pain most on L "toe-off" phase of gait intermittently in session today.  Reviewed calf stretching with pt. and encourage her to be more consistent with this at home.  Pt. demonstrating improved eccentric control with stepping activities today and progressing well at this point.  Still reporting she feels limited with prolonged sitting and standing positioning and picking up heavy objects from floor.  Will continue to progress toward goals.      PT Treatment/Interventions  ADLs/Self Care Home Management;Cryotherapy;Electrical Stimulation;Iontophoresis 32m/ml Dexamethasone;Moist Heat;Ultrasound;DME Instruction;Gait training;Stair training;Functional mobility training;Therapeutic activities;Therapeutic exercise;Balance training;Neuromuscular re-education;Patient/family education;Manual techniques;Scar mobilization;Passive range of motion;Dry needling;Taping;Vasopneumatic Device    Consulted and Agree with Plan of Care  Patient       Patient will benefit from skilled therapeutic intervention in order to improve the following deficits and impairments:  Abnormal gait, Decreased skin integrity, Hypomobility, Impaired sensation, Increased edema, Decreased activity tolerance, Decreased strength, Increased fascial restricitons, Pain, Decreased mobility, Difficulty walking, Increased muscle spasms, Decreased range of motion, Improper body mechanics, Impaired flexibility, Decreased balance  Visit Diagnosis: Acute pain of left knee  Difficulty in walking, not elsewhere classified  Other symptoms and signs involving the musculoskeletal system  Stiffness of left knee, not elsewhere classified  Muscle  weakness (generalized)  Other abnormalities of gait and mobility     Problem List Patient Active Problem List   Diagnosis Date Noted  . S/P left unicompartmental knee replacement 01/13/2018  . OA (osteoarthritis) of knee 01/21/2017  . Left knee pain 04/19/2014  . BRONCHITIS, ACUTE 07/21/2010    MBess Harvest PTA 04/06/18 12:29 PM  [facility] [address] [place] [address]Phone: [phone]  Fax:  [phone] Name: Joanna PARKEYMRN: [id number] of Birth: 107/25/1957

## 2018-04-09 ENCOUNTER — Ambulatory Visit: Payer: 59

## 2018-04-09 DIAGNOSIS — M25662 Stiffness of left knee, not elsewhere classified: Secondary | ICD-10-CM

## 2018-04-09 DIAGNOSIS — M6281 Muscle weakness (generalized): Secondary | ICD-10-CM

## 2018-04-09 DIAGNOSIS — R29898 Other symptoms and signs involving the musculoskeletal system: Secondary | ICD-10-CM | POA: Diagnosis not present

## 2018-04-09 DIAGNOSIS — R262 Difficulty in walking, not elsewhere classified: Secondary | ICD-10-CM | POA: Diagnosis not present

## 2018-04-09 DIAGNOSIS — M25562 Pain in left knee: Secondary | ICD-10-CM

## 2018-04-09 DIAGNOSIS — R2689 Other abnormalities of gait and mobility: Secondary | ICD-10-CM | POA: Diagnosis not present

## 2018-04-09 NOTE — Therapy (Signed)
[facility] [address]  [place], [place], [address] Phone: [phone]   Fax:  [phone]  Physical Therapy Treatment  Patient Details  Name: Joanna Willis MRN: [id number] Date of Birth: [birth year] Referring Provider (PT): Theresa Duty, PA-C for [name], MD   Encounter Date: 04/09/2018  PT End of Session - 04/09/18 0856    Visit Number  27    Number of Visits  30    Date for PT Re-Evaluation  04/15/18    Authorization Type  [facility]    PT Start Time  0847    PT Stop Time  0925    PT Time Calculation (min)  38 min    Activity Tolerance  Patient tolerated treatment well    Behavior During Therapy  [facility] for tasks assessed/performed       Past Medical History:  Diagnosis Date  . Arthritis   . Depression   . GERD (gastroesophageal reflux disease)   . Hypercholesteremia   . PONV (postoperative nausea and vomiting)   . Postmenopausal   . Wears glasses     Past Surgical History:  Procedure Laterality Date  . ABDOMINAL HYSTERECTOMY  2005   did not remove ovaries  . CESAREAN SECTION  2000  . COLONOSCOPY    . CONVERSION TO TOTAL KNEE Left 01/13/2018   Procedure: Revision of Left knee unicompartmental arthroplasty to total knee arthroplasty;  Surgeon: [name], MD;  [facility];  Service: Orthopedics;  Laterality: Left;  . FOOT ARTHRODESIS  1995   right  . KNEE ARTHROSCOPY WITH MEDIAL MENISECTOMY Left 05/17/2014   Procedure: LEFT ARTHROSCOPY KNEE WITH PARTIAL MEDIAL MENISECTOMY, CHONDROPLASTY OF LATERAL FEMORAL CONDYLE;  Surgeon: Alta Corning, MD;  Location: [place];  Service: Orthopedics;  Laterality: Left;  . PARTIAL KNEE ARTHROPLASTY Left 01/21/2017   Procedure: UNICOMPARTMENTAL LEFT KNEE;  Surgeon: [name], MD;  [facility];  Service: Orthopedics;  Laterality: Left;  . Revision left knee     Dr. Wynelle Link 01-13-18    There were no vitals filed for this  visit.  Subjective Assessment - 04/09/18 0850    Subjective  Pt. noting MD put her on Gabapentin which has nearly resolved "nervy" pains.  Notes MD has her on four more weeks limited part time work.      How long can you sit comfortably?  1 hours     How long can you stand comfortably?  2 hours     How long can you walk comfortably?  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  No/denies    Pain Score  0-No pain    Multiple Pain Sites  No                       [facility] Adult PT Treatment/Exercise - 04/09/18 0853      Knee/Hip Exercises: Stretches   Gastroc Stretch  Left;60 seconds;2 reps    Gastroc Stretch Limitations  standing and standing at [employer]      Knee/Hip Exercises: Aerobic   Recumbent Bike  L2 x 7 min      Knee/Hip Exercises: Machines for Strengthening   Cybex Knee Flexion  L only 15# x 20 reps       Knee/Hip Exercises: Standing   Heel Raises  15 reps;Both;Left    Heel  Raises Limitations  B Con/L ecc    Forward Lunges  Right;Left;15 reps;3 seconds    Forward Lunges Limitations  TRX     Functional Squat  3 seconds;15 reps    Functional Squat Limitations  TRX - from box with airex       Manual Therapy   Manual Therapy  Joint mobilization    Manual therapy comments  hooklying    Joint Mobilization  L inferiror patellar mobs + tibiofemoral A/P & P/A mobs for increased flexion ROM               PT Short Term Goals - 02/23/18 0845      PT SHORT TERM GOAL #1   Title  Pt will be independent with initial HEP    Status  Achieved      PT SHORT TERM GOAL #2   Title  Pt will have 90 degrees of L knee AROM flexion    Status  Achieved        PT Long Term Goals - 04/06/18 0925      PT LONG TERM GOAL #1   Title  Pt to be independent with advanced HEP    Status  Partially Met      PT LONG TERM GOAL #2   Title  Patient to demonstrate 0-120 degrees of AROM at L knee to improve gait and  abilty to perform ADLs independently    Status  On-going      PT LONG TERM GOAL #3   Title  Patient to report ability to take care of animals and garden independently with no increased pain.     Status  Partially Met      PT LONG TERM GOAL #4   Title  Pt to report pain at worst 4/10 with activity     Status  Achieved      PT LONG TERM GOAL #5   Title  Pt will demonstrate normal gait pattern with LRAD in order to improve functional mobility and decrease fall risk    Status  On-going      PT LONG TERM GOAL #6   Title  Pt will have LE strength globally of 4+/5 to improve functional mobility and perform ADLs and job-related activities safely    Status  Achieved      PT LONG TERM GOAL #7   Title  Pt will ascend/descend stairs reciprocally with good gait pattern to allow access to all levels of new home    Status  Achieved            Plan - 04/09/18 0900    Clinical Impression Statement  Pt. reporting resolution of "nervy" anterior/lateral shin pain since last visit and attributes this to adjustment in medication since seeing MD.  Able to progress to more functional strengthening activities today with improved tolerance.  Ended visit with pt. declining ice.  Progressing well toward goals with pt. noting MD wanting to continue four more weeks of therapy to work toward improved work tolerance.      PT Treatment/Interventions  ADLs/Self Care Home Management;Cryotherapy;Electrical Stimulation;Iontophoresis 67m/ml Dexamethasone;Moist Heat;Ultrasound;DME Instruction;Gait training;Stair training;Functional mobility training;Therapeutic activities;Therapeutic exercise;Balance training;Neuromuscular re-education;Patient/family education;Manual techniques;Scar mobilization;Passive range of motion;Dry needling;Taping;Vasopneumatic Device    Consulted and Agree with Plan of Care  Patient       Patient will benefit from skilled therapeutic intervention in order to improve the following deficits and  impairments:  Abnormal gait, Decreased skin integrity, Hypomobility, Impaired sensation, Increased edema, Decreased activity tolerance,  Decreased strength, Increased fascial restricitons, Pain, Decreased mobility, Difficulty walking, Increased muscle spasms, Decreased range of motion, Improper body mechanics, Impaired flexibility, Decreased balance  Visit Diagnosis: Acute pain of left knee  Difficulty in walking, not elsewhere classified  Other symptoms and signs involving the musculoskeletal system  Stiffness of left knee, not elsewhere classified  Muscle weakness (generalized)  Other abnormalities of gait and mobility     Problem List Patient Active Problem List   Diagnosis Date Noted  . S/P left unicompartmental knee replacement 01/13/2018  . OA (osteoarthritis) of knee 01/21/2017  . Left knee pain 04/19/2014  . BRONCHITIS, ACUTE 07/21/2010    Bess Harvest, PTA 04/09/18 1:10 PM    [facility] [address]  [place], [place], [address] Phone: [phone]   Fax:  [phone]  Name: Joanna Willis MRN: [id number] Date of Birth: [birth year]

## 2018-04-14 ENCOUNTER — Ambulatory Visit: Payer: 59

## 2018-04-14 DIAGNOSIS — M25662 Stiffness of left knee, not elsewhere classified: Secondary | ICD-10-CM

## 2018-04-14 DIAGNOSIS — R2689 Other abnormalities of gait and mobility: Secondary | ICD-10-CM | POA: Diagnosis not present

## 2018-04-14 DIAGNOSIS — M25562 Pain in left knee: Secondary | ICD-10-CM

## 2018-04-14 DIAGNOSIS — R262 Difficulty in walking, not elsewhere classified: Secondary | ICD-10-CM | POA: Diagnosis not present

## 2018-04-14 DIAGNOSIS — R29898 Other symptoms and signs involving the musculoskeletal system: Secondary | ICD-10-CM | POA: Diagnosis not present

## 2018-04-14 DIAGNOSIS — M6281 Muscle weakness (generalized): Secondary | ICD-10-CM

## 2018-04-14 NOTE — Therapy (Signed)
[name] [address]  [name], [place], [address] Phone: [phone]   Fax:  [phone]  Physical Therapy Treatment  Patient Details  Name: Joanna Willis MRN: [id number] Date of Birth: 1955-09-02 Referring Provider (PT): Theresa Duty, PA-C for [name], MD   Encounter Date: 04/14/2018  PT End of Session - 04/14/18 1025    Visit Number  28    Number of Visits  30    Date for PT Re-Evaluation  04/15/18    Authorization Type  [facility]    PT Start Time  1016    PT Stop Time  1115    PT Time Calculation (min)  59 min    Activity Tolerance  Patient tolerated treatment well    Behavior During Therapy  [facility] for tasks assessed/performed       Past Medical History:  Diagnosis Date  . Arthritis   . Depression   . GERD (gastroesophageal reflux disease)   . Hypercholesteremia   . PONV (postoperative nausea and vomiting)   . Postmenopausal   . Wears glasses     Past Surgical History:  Procedure Laterality Date  . ABDOMINAL HYSTERECTOMY  2005   did not remove ovaries  . CESAREAN SECTION  2000  . COLONOSCOPY    . CONVERSION TO TOTAL KNEE Left 01/13/2018   Procedure: Revision of Left knee unicompartmental arthroplasty to total knee arthroplasty;  Surgeon: [name], MD;  Location: [facility];  Service: Orthopedics;  Laterality: Left;  . FOOT ARTHRODESIS  1995   right  . KNEE ARTHROSCOPY WITH MEDIAL MENISECTOMY Left 05/17/2014   Procedure: LEFT ARTHROSCOPY KNEE WITH PARTIAL MEDIAL MENISECTOMY, CHONDROPLASTY OF LATERAL FEMORAL CONDYLE;  Surgeon: Alta Corning, MD;  Location: [place];  Service: Orthopedics;  Laterality: Left;  . PARTIAL KNEE ARTHROPLASTY Left 01/21/2017   Procedure: UNICOMPARTMENTAL LEFT KNEE;  Surgeon: [name], MD;  Location: [facility];  Service: Orthopedics;  Laterality: Left;  . Revision left knee     Dr. Wynelle Link 01-13-18    There were no vitals filed for this  visit.  Subjective Assessment - 04/14/18 1019    Subjective  Pt. noting "today is the best i've felt since surgery".      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  No/denies    Pain Score  0-No pain    Multiple Pain Sites  No                       [facility] Adult PT Treatment/Exercise - 04/14/18 1026      Knee/Hip Exercises: Stretches   Quad Stretch  Left;60 seconds;1 rep    Sports administrator Limitations  with bolster under thigh    Hip Flexor Stretch  Left;60 seconds;1 rep    Hip Flexor Stretch Limitations  mod thomas with strap     Knee: Self-Stretch to increase Flexion  Left   5" x 10 reps lunge knee flexion stretch on stool with manual     Knee/Hip Exercises: Aerobic   Elliptical  Lvl 1.0, 3 min       Knee/Hip Exercises: Machines for Strengthening   Cybex Knee Extension  B con/L ecc 15# x 15 rpes       Knee/Hip Exercises: Standing   Heel Raises  15 reps;Both;Left    Heel Raises Limitations  B Con/L ecc   at  UBE   Side Lunges  --    Side Lunges Limitations  --    Terminal Knee Extension  Left;10 reps;Theraband    Theraband Level (Terminal Knee Extension)  Other (comment)    Terminal Knee Extension Limitations  black TB    Lateral Step Up  Left;Right;Step Height: 8";Hand Hold: 0;15 reps    Lateral Step Up Limitations  step up and over sideways    Step Down  Left;10 reps;Step Height: 8";Hand Hold: 1    Step Down Limitations  eccentric heel tap     Functional Squat  15 reps;5 seconds    Functional Squat Limitations  TRX + heel raise at bottom of motion       Vasopneumatic   Number Minutes Vasopneumatic   15 minutes    Vasopnuematic Location   Knee    Vasopneumatic Pressure  Medium    Vasopneumatic Temperature   Lowest      Manual Therapy   Manual Therapy  Joint mobilization    Manual therapy comments  in standing with knee flexion stretch     Joint Mobilization  seat belt assisted A/P mobs with standing  lunge stretch                PT Short Term Goals - 02/23/18 0845      PT SHORT TERM GOAL #1   Title  Pt will be independent with initial HEP    Status  Achieved      PT SHORT TERM GOAL #2   Title  Pt will have 90 degrees of L knee AROM flexion    Status  Achieved        PT Long Term Goals - 04/06/18 0925      PT LONG TERM GOAL #1   Title  Pt to be independent with advanced HEP    Status  Partially Met      PT LONG TERM GOAL #2   Title  Patient to demonstrate 0-120 degrees of AROM at L knee to improve gait and abilty to perform ADLs independently    Status  On-going      PT LONG TERM GOAL #3   Title  Patient to report ability to take care of animals and garden independently with no increased pain.     Status  Partially Met      PT LONG TERM GOAL #4   Title  Pt to report pain at worst 4/10 with activity     Status  Achieved      PT LONG TERM GOAL #5   Title  Pt will demonstrate normal gait pattern with LRAD in order to improve functional mobility and decrease fall risk    Status  On-going      PT LONG TERM GOAL #6   Title  Pt will have LE strength globally of 4+/5 to improve functional mobility and perform ADLs and job-related activities safely    Status  Achieved      PT LONG TERM GOAL #7   Title  Pt will ascend/descend stairs reciprocally with good gait pattern to allow access to all levels of new home    Status  Achieved            Plan - 04/14/18 1025    Clinical Impression Statement  Pt. tolerated progression of eccentric step-down, TRX squat, and TKE with black band resistance without issue today.  Flexion ROM visually seems to be progression however not formally measured today.  Pt. notes improvement  in knee comfort now.  Manual therapy focused on active knee flexion lunge stretch with joint mobs for improved flexion ROM.  Progressing well toward goals.      PT Treatment/Interventions  ADLs/Self Care Home Management;Cryotherapy;Electrical  Stimulation;Iontophoresis 71m/ml Dexamethasone;Moist Heat;Ultrasound;DME Instruction;Gait training;Stair training;Functional mobility training;Therapeutic activities;Therapeutic exercise;Balance training;Neuromuscular re-education;Patient/family education;Manual techniques;Scar mobilization;Passive range of motion;Dry needling;Taping;Vasopneumatic Device    Consulted and Agree with Plan of Care  Patient       Patient will benefit from skilled therapeutic intervention in order to improve the following deficits and impairments:  Abnormal gait, Decreased skin integrity, Hypomobility, Impaired sensation, Increased edema, Decreased activity tolerance, Decreased strength, Increased fascial restricitons, Pain, Decreased mobility, Difficulty walking, Increased muscle spasms, Decreased range of motion, Improper body mechanics, Impaired flexibility, Decreased balance  Visit Diagnosis: Acute pain of left knee  Difficulty in walking, not elsewhere classified  Other symptoms and signs involving the musculoskeletal system  Stiffness of left knee, not elsewhere classified  Muscle weakness (generalized)  Other abnormalities of gait and mobility     Problem List Patient Active Problem List   Diagnosis Date Noted  . S/P left unicompartmental knee replacement 01/13/2018  . OA (osteoarthritis) of knee 01/21/2017  . Left knee pain 04/19/2014  . BRONCHITIS, ACUTE 07/21/2010    MBess Harvest PTA 04/14/18 12:06 PM   [facility] [address] [place] [address]Phone: [phone]  Fax:  [phone] Name: JCHITARA CLONCHMRN: [id number] of Birth: [birth year]

## 2018-04-16 ENCOUNTER — Encounter: Payer: 59 | Admitting: Physical Therapy

## 2018-04-21 ENCOUNTER — Ambulatory Visit: Payer: 59

## 2018-04-21 DIAGNOSIS — R2689 Other abnormalities of gait and mobility: Secondary | ICD-10-CM

## 2018-04-21 DIAGNOSIS — R29898 Other symptoms and signs involving the musculoskeletal system: Secondary | ICD-10-CM | POA: Diagnosis not present

## 2018-04-21 DIAGNOSIS — M25562 Pain in left knee: Secondary | ICD-10-CM

## 2018-04-21 DIAGNOSIS — M25662 Stiffness of left knee, not elsewhere classified: Secondary | ICD-10-CM

## 2018-04-21 DIAGNOSIS — M6281 Muscle weakness (generalized): Secondary | ICD-10-CM

## 2018-04-21 DIAGNOSIS — R262 Difficulty in walking, not elsewhere classified: Secondary | ICD-10-CM | POA: Diagnosis not present

## 2018-04-21 NOTE — Therapy (Signed)
[facility] [address]  [place], [place], [address] Phone: [phone]   Fax:  [phone]  Physical Therapy Treatment  Patient Details  Name: LEIANI ENRIGHT MRN: [id number] Date of Birth: [birth year] Referring Provider (PT): Theresa Duty, PA-C for [name], MD   Encounter Date: 04/21/2018  PT End of Session - 04/21/18 1018    Visit Number  29    Number of Visits  30    Date for PT Re-Evaluation  04/15/18    Authorization Type  [facility]    PT Start Time  3254    PT Stop Time  1058    PT Time Calculation (min)  43 min    Activity Tolerance  Patient tolerated treatment well    Behavior During Therapy  [facility] for tasks assessed/performed       Past Medical History:  Diagnosis Date  . Arthritis   . Depression   . GERD (gastroesophageal reflux disease)   . Hypercholesteremia   . PONV (postoperative nausea and vomiting)   . Postmenopausal   . Wears glasses     Past Surgical History:  Procedure Laterality Date  . ABDOMINAL HYSTERECTOMY  2005   did not remove ovaries  . CESAREAN SECTION  2000  . COLONOSCOPY    . CONVERSION TO TOTAL KNEE Left 01/13/2018   Procedure: Revision of Left knee unicompartmental arthroplasty to total knee arthroplasty;  Surgeon: [name], MD;  Location: [facility];  Service: Orthopedics;  Laterality: Left;  . FOOT ARTHRODESIS  1995   right  . KNEE ARTHROSCOPY WITH MEDIAL MENISECTOMY Left 05/17/2014   Procedure: LEFT ARTHROSCOPY KNEE WITH PARTIAL MEDIAL MENISECTOMY, CHONDROPLASTY OF LATERAL FEMORAL CONDYLE;  Surgeon: Alta Corning, MD;  Location: [facility];  Service: Orthopedics;  Laterality: Left;  . PARTIAL KNEE ARTHROPLASTY Left 01/21/2017   Procedure: UNICOMPARTMENTAL LEFT KNEE;  Surgeon: [name], MD;  Location: [facility];  Service: Orthopedics;  Laterality: Left;  . Revision left knee     Dr. Wynelle Link 01-13-18    There were no vitals filed for this  visit.  Subjective Assessment - 04/21/18 1017    Subjective  Pt. noting she had "restless legs" following last session.      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  No/denies    Pain Score  0-No pain    Multiple Pain Sites  No         [facility] PT Assessment - 04/21/18 1042      AROM   AROM Assessment Site  Knee    Right/Left Knee  Left    Left Knee Extension  3    Left Knee Flexion  104      PROM   PROM Assessment Site  Knee    Right/Left Knee  Left    Left Knee Extension  2    Left Knee Flexion  111                   [facility] Adult PT Treatment/Exercise - 04/21/18 1024      Ambulation/Gait   Stairs  Yes    Stairs Assistance  6: Modified independent (Device/Increase time)    Stair Management Technique  One rail Right    Number of Stairs  14    Gait Comments  Pt. able to ascend/descend reciprocally with only slight evidence of R hip drop; good overall  quad stability; cueing provided to correct hip deviation with pt. verbalizing understanding       Knee/Hip Exercises: Stretches   Sports administrator  Left;60 seconds;1 rep    Sports administrator Limitations  with bolster under thigh    Hip Flexor Stretch  Left;60 seconds;1 rep    Hip Flexor Stretch Limitations  mod thomas with strap     Knee: Self-Stretch to increase Flexion  Left   5" x 10 reps    Gastroc Stretch  Left;1 rep;60 seconds    Gastroc Stretch Limitations  prostretch       Knee/Hip Exercises: Aerobic   Elliptical  Lvl 1.0, 7 min       Knee/Hip Exercises: Machines for Strengthening   Cybex Knee Extension  B con/L ecc 15# x 15 reps     Cybex Knee Flexion  B LE's: 25# 2 x 15 reps       Knee/Hip Exercises: Standing   Step Down  Left;Hand Hold: 4;40 reps;Step Height: 6"    Step Down Limitations  eccentric heel tap     Wall Squat  15 reps;5 seconds    SLS with Vectors  L SLS with red TB pallof press x 10 reps each way       Manual Therapy   Manual Therapy   Joint mobilization    Manual therapy comments  supine     Joint Mobilization  L knee A/P mobs for improved ROM; L patellar mobs all directions for improved ROM                PT Short Term Goals - 02/23/18 0845      PT SHORT TERM GOAL #1   Title  Pt will be independent with initial HEP    Status  Achieved      PT SHORT TERM GOAL #2   Title  Pt will have 90 degrees of L knee AROM flexion    Status  Achieved        PT Long Term Goals - 04/06/18 0925      PT LONG TERM GOAL #1   Title  Pt to be independent with advanced HEP    Status  Partially Met      PT LONG TERM GOAL #2   Title  Patient to demonstrate 0-120 degrees of AROM at L knee to improve gait and abilty to perform ADLs independently    Status  On-going      PT LONG TERM GOAL #3   Title  Patient to report ability to take care of animals and garden independently with no increased pain.     Status  Partially Met      PT LONG TERM GOAL #4   Title  Pt to report pain at worst 4/10 with activity     Status  Achieved      PT LONG TERM GOAL #5   Title  Pt will demonstrate normal gait pattern with LRAD in order to improve functional mobility and decrease fall risk    Status  On-going      PT LONG TERM GOAL #6   Title  Pt will have LE strength globally of 4+/5 to improve functional mobility and perform ADLs and job-related activities safely    Status  Achieved      PT LONG TERM GOAL #7   Title  Pt will ascend/descend stairs reciprocally with good gait pattern to allow access to all levels of new home    Status  Achieved  Plan - 04/21/18 1018    Clinical Impression Statement  Heylee reporting less instances of L calf pain with push-off phase of gait now.  Able to demo improvement in PROM L knee flexion to 111 dg and AROM 104 dg.  Demonstrating improved L quad eccentric control on descending stairs reciprocally today only requiring min cueing to avoid R hip drop.  Pt. progressing well toward goals.       PT Treatment/Interventions  ADLs/Self Care Home Management;Cryotherapy;Electrical Stimulation;Iontophoresis 75m/ml Dexamethasone;Moist Heat;Ultrasound;DME Instruction;Gait training;Stair training;Functional mobility training;Therapeutic activities;Therapeutic exercise;Balance training;Neuromuscular re-education;Patient/family education;Manual techniques;Scar mobilization;Passive range of motion;Dry needling;Taping;Vasopneumatic Device    Consulted and Agree with Plan of Care  Patient       Patient will benefit from skilled therapeutic intervention in order to improve the following deficits and impairments:  Abnormal gait, Decreased skin integrity, Hypomobility, Impaired sensation, Increased edema, Decreased activity tolerance, Decreased strength, Increased fascial restricitons, Pain, Decreased mobility, Difficulty walking, Increased muscle spasms, Decreased range of motion, Improper body mechanics, Impaired flexibility, Decreased balance  Visit Diagnosis: Acute pain of left knee  Difficulty in walking, not elsewhere classified  Other symptoms and signs involving the musculoskeletal system  Stiffness of left knee, not elsewhere classified  Muscle weakness (generalized)  Other abnormalities of gait and mobility     Problem List Patient Active Problem List   Diagnosis Date Noted  . S/P left unicompartmental knee replacement 01/13/2018  . OA (osteoarthritis) of knee 01/21/2017  . Left knee pain 04/19/2014  . BRONCHITIS, ACUTE 07/21/2010    MBess Harvest PTA 04/21/18 12:49 PM   [facility] [address] [place] [address]Phone: [phone]  Fax:  [phone] Name: JAYISHA POLMRN: [id number] of Birth: [birth year]

## 2018-04-23 ENCOUNTER — Encounter: Payer: Self-pay | Admitting: Physical Therapy

## 2018-04-23 ENCOUNTER — Ambulatory Visit: Payer: 59 | Attending: Student | Admitting: Physical Therapy

## 2018-04-23 DIAGNOSIS — R29898 Other symptoms and signs involving the musculoskeletal system: Secondary | ICD-10-CM | POA: Insufficient documentation

## 2018-04-23 DIAGNOSIS — M25562 Pain in left knee: Secondary | ICD-10-CM | POA: Diagnosis not present

## 2018-04-23 DIAGNOSIS — R2689 Other abnormalities of gait and mobility: Secondary | ICD-10-CM | POA: Insufficient documentation

## 2018-04-23 DIAGNOSIS — R262 Difficulty in walking, not elsewhere classified: Secondary | ICD-10-CM | POA: Diagnosis not present

## 2018-04-23 DIAGNOSIS — M25662 Stiffness of left knee, not elsewhere classified: Secondary | ICD-10-CM | POA: Insufficient documentation

## 2018-04-23 DIAGNOSIS — M6281 Muscle weakness (generalized): Secondary | ICD-10-CM | POA: Insufficient documentation

## 2018-04-23 NOTE — Therapy (Signed)
[facility] [address]  [place], [place], [address] Phone: [phone]   Fax:  [phone]  Physical Therapy Treatment  Patient Details  Name: Joanna Willis MRN: [id number] Date of Birth: August 23, 1955 Referring Provider (PT): Joanna Duty, PA-C for Joanna Arabian, MD   Encounter Date: 04/23/2018  PT End of Session - 04/23/18 1023    Visit Number  30    Number of Visits  38    Date for PT Re-Evaluation  05/21/18    Authorization Type  [facility]    PT Start Time  2379    PT Stop Time  1102    PT Time Calculation (min)  39 min    Activity Tolerance  Patient tolerated treatment well    Behavior During Therapy  [facility] for tasks assessed/performed       Past Medical History:  Diagnosis Date  . Arthritis   . Depression   . GERD (gastroesophageal reflux disease)   . Hypercholesteremia   . PONV (postoperative nausea and vomiting)   . Postmenopausal   . Wears glasses     Past Surgical History:  Procedure Laterality Date  . ABDOMINAL HYSTERECTOMY  2005   did not remove ovaries  . CESAREAN SECTION  2000  . COLONOSCOPY    . CONVERSION TO TOTAL KNEE Left 01/13/2018   Procedure: Revision of Left knee unicompartmental arthroplasty to total knee arthroplasty;  Surgeon: Joanna Arabian, MD;  Location: [facility];  Service: Orthopedics;  Laterality: Left;  . FOOT ARTHRODESIS  1995   right  . KNEE ARTHROSCOPY WITH MEDIAL MENISECTOMY Left 05/17/2014   Procedure: LEFT ARTHROSCOPY KNEE WITH PARTIAL MEDIAL MENISECTOMY, CHONDROPLASTY OF LATERAL FEMORAL CONDYLE;  Surgeon: Alta Corning, MD;  Location: [place];  Service: Orthopedics;  Laterality: Left;  . PARTIAL KNEE ARTHROPLASTY Left 01/21/2017   Procedure: UNICOMPARTMENTAL LEFT KNEE;  Surgeon: Joanna Arabian, MD;  Location[facility];  Service: Orthopedics;  Laterality: Left;  . Revision left knee     Dr. Wynelle Link 01-13-18    There were no vitals filed for this  visit.  Subjective Assessment - 04/23/18 1027    Subjective  Pt stating she doesn't feel good today in general and knee is hurting more.    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  Yes    Pain Score  5    4-5/10   Pain Location  Knee    Pain Orientation  Left    Pain Descriptors / Indicators  Aching;Tightness   "stiff"   Pain Type  Surgical pain;Acute pain    Pain Frequency  Intermittent         [facility] PT Assessment - 04/23/18 1023      Assessment   Medical Diagnosis  L TKA    Referring Provider (PT)  Joanna Duty, PA-C for Joanna Arabian, MD    Onset Date/Surgical Date  01/13/18    Next MD Visit  05/04/18      Prior Function   Level of Independence  Independent    Vocation  Full time employment   currenlty only PT due to L knee   Vocation Requirements  Cardiac rehab nurse    Leisure  Garden, quilt, take care of animals including goats and dogs      AROM   Left Knee Extension  4    Left Knee Flexion  105  PROM   Left Knee Extension  2    Left Knee Flexion  110      Strength   Right Hip Flexion  5/5    Right Hip Extension  4+/5    Right Hip ABduction  5/5    Right Hip ADduction  5/5    Left Hip Flexion  4/5    Left Hip Extension  4+/5    Left Hip ABduction  5/5    Left Hip ADduction  4+/5    Right Knee Flexion  5/5    Right Knee Extension  5/5    Left Knee Flexion  4/5   pain   Left Knee Extension  4+/5                   [facility] Adult PT Treatment/Exercise - 04/23/18 1023      Ambulation/Gait   Stairs Assistance  6: Modified independent (Device/Increase time)    Stair Management Technique  One rail Right    Number of Stairs  14    Height of Stairs  7    Gait Comments  Pt utilizing L hip hike to substitute for limited L hip & knee flexion on stair ascent, and R hip drop to minimze need for L knee eccentirc control on descent.      Knee/Hip Exercises: Aerobic   Recumbent Bike  L2 x 6  min      Knee/Hip Exercises: Standing   Step Down  Left;15 reps;Step Height: 6";Hand Hold: 1    Step Down Limitations  lat & fwd eccentric step-down with heel touch - focusing on maintaining level pelvis using mirror for visual feedback               PT Short Term Goals - 02/23/18 0845      PT SHORT TERM GOAL #1   Title  Pt will be independent with initial HEP    Status  Achieved      PT SHORT TERM GOAL #2   Title  Pt will have 90 degrees of L knee AROM flexion    Status  Achieved        PT Long Term Goals - 04/23/18 1031      PT LONG TERM GOAL #1   Title  Pt to be independent with advanced HEP    Status  Partially Met      PT LONG TERM GOAL #2   Title  Patient to demonstrate 0-120 degrees of AROM at L knee to improve gait and abilty to perform ADLs independently    Status  On-going      PT LONG TERM GOAL #3   Title  Patient to report ability to take care of animals and garden independently with no increased pain.     Status  Partially Met      PT LONG TERM GOAL #4   Title  Pt to report pain at worst 4/10 with activity     Status  Partially Met      PT LONG TERM GOAL #5   Title  Pt will demonstrate normal gait pattern with LRAD in order to improve functional mobility and decrease fall risk    Status  Achieved      PT LONG TERM GOAL #6   Title  Pt will have LE strength globally of 4+/5 to improve functional mobility and perform ADLs and job-related activities safely    Status  Partially Met      PT LONG TERM  GOAL #7   Title  Pt will ascend/descend stairs reciprocally with good gait pattern to allow access to all levels of new home    Status  Partially Met            Plan - 04/23/18 1029    Clinical Impression Statement  [name] is continuing to progress with PT, now able to ambulate with normal gait pattern w/o AD with no evidence instability on L - LTG for gait now met. She continues to experience significant fluctations in pain and stiffness/muscle  tightness which limit ROM, strength and activity tolerance at times. L knee AROM currently 4-105 and PROM 2-110. B hip strength now 4+/5 to 5/5, but L knee only 4/5 for flexion and 4+/5 for extension with eccentric control more limited in extension. L knee pain and limited L knee ROM and strength continues to impact stair negoitation, with pt relying on hip substitution, as well as daily tasks around her home and seh remains only part-time at work due to limitations from her L knee, hence remaining LTGs ongoing or only partially met. Based on MD plan as of last office visit, will plan to recert for continued PT to address ongoing deficits.    Rehab Potential  Good    PT Frequency  2x / week    PT Duration  4 weeks    PT Treatment/Interventions  ADLs/Self Care Home Management;Cryotherapy;Electrical Stimulation;Iontophoresis 58m/ml Dexamethasone;Moist Heat;Ultrasound;DME Instruction;Gait training;Stair training;Functional mobility training;Therapeutic activities;Therapeutic exercise;Balance training;Neuromuscular re-education;Patient/family education;Manual techniques;Scar mobilization;Passive range of motion;Dry needling;Taping;Vasopneumatic Device    PT Next Visit Plan  FOTO    Consulted and Agree with Plan of Care  Patient       Patient will benefit from skilled therapeutic intervention in order to improve the following deficits and impairments:  Abnormal gait, Decreased skin integrity, Hypomobility, Impaired sensation, Increased edema, Decreased activity tolerance, Decreased strength, Increased fascial restricitons, Pain, Decreased mobility, Difficulty walking, Increased muscle spasms, Decreased range of motion, Improper body mechanics, Impaired flexibility, Decreased balance  Visit Diagnosis: Acute pain of left knee  Difficulty in walking, not elsewhere classified  Other symptoms and signs involving the musculoskeletal system  Stiffness of left knee, not elsewhere classified  Muscle weakness  (generalized)  Other abnormalities of gait and mobility     Problem List Patient Active Problem List   Diagnosis Date Noted  . S/P left unicompartmental knee replacement 01/13/2018  . OA (osteoarthritis) of knee 01/21/2017  . Left knee pain 04/19/2014  . BRONCHITIS, ACUTE 07/21/2010    JPercival Spanish PT, MPT 04/23/2018, 12:36 PM  [facility] [place] [address]Phone: [phone]  Fax:  [phone] Name: JANAID HANEYMRN: [id number] of Birth: 1February 26, 1957

## 2018-04-28 ENCOUNTER — Ambulatory Visit: Payer: 59

## 2018-04-28 DIAGNOSIS — M25562 Pain in left knee: Secondary | ICD-10-CM | POA: Diagnosis not present

## 2018-04-28 DIAGNOSIS — M25662 Stiffness of left knee, not elsewhere classified: Secondary | ICD-10-CM

## 2018-04-28 DIAGNOSIS — R262 Difficulty in walking, not elsewhere classified: Secondary | ICD-10-CM

## 2018-04-28 DIAGNOSIS — R29898 Other symptoms and signs involving the musculoskeletal system: Secondary | ICD-10-CM | POA: Diagnosis not present

## 2018-04-28 DIAGNOSIS — M6281 Muscle weakness (generalized): Secondary | ICD-10-CM | POA: Diagnosis not present

## 2018-04-28 DIAGNOSIS — R2689 Other abnormalities of gait and mobility: Secondary | ICD-10-CM | POA: Diagnosis not present

## 2018-04-28 MED FILL — OMEPRAZOLE 20 MG CPDR: 20 | 90 days supply | Qty: 90 | Fill #1

## 2018-04-28 NOTE — Therapy (Signed)
[facility] [address]  [place], [place], [address] Phone: [phone]   Fax:  [phone]  Physical Therapy Treatment  Patient Details  Name: Joanna Willis MRN: [id number] Date of Birth: 1955-07-21 Referring Provider (PT): Theresa Duty, PA-C for [name], MD   Encounter Date: 04/28/2018  PT End of Session - 04/28/18 1024    Visit Number  31    Number of Visits  38    Date for PT Re-Evaluation  05/21/18    Authorization Type  [facility]    PT Start Time  1019    PT Stop Time  1057    PT Time Calculation (min)  38 min    Activity Tolerance  Patient tolerated treatment well    Behavior During Therapy  [facility] for tasks assessed/performed       Past Medical History:  Diagnosis Date  . Arthritis   . Depression   . GERD (gastroesophageal reflux disease)   . Hypercholesteremia   . PONV (postoperative nausea and vomiting)   . Postmenopausal   . Wears glasses     Past Surgical History:  Procedure Laterality Date  . ABDOMINAL HYSTERECTOMY  2005   did not remove ovaries  . CESAREAN SECTION  2000  . COLONOSCOPY    . CONVERSION TO TOTAL KNEE Left 01/13/2018   Procedure: Revision of Left knee unicompartmental arthroplasty to total knee arthroplasty;  Surgeon: [name], MD;  Location: [facility];  Service: Orthopedics;  Laterality: Left;  . FOOT ARTHRODESIS  1995   right  . KNEE ARTHROSCOPY WITH MEDIAL MENISECTOMY Left 05/17/2014   Procedure: LEFT ARTHROSCOPY KNEE WITH PARTIAL MEDIAL MENISECTOMY, CHONDROPLASTY OF LATERAL FEMORAL CONDYLE;  Surgeon: Alta Corning, MD;  Location: [place];  Service: Orthopedics;  Laterality: Left;  . PARTIAL KNEE ARTHROPLASTY Left 01/21/2017   Procedure: UNICOMPARTMENTAL LEFT KNEE;  Surgeon: [name], MD;  Location: [facility];  Service: Orthopedics;  Laterality: Left;  . Revision left knee     Dr. Wynelle Link 01-13-18    There were no vitals filed for this  visit.  Subjective Assessment - 04/28/18 1021    Subjective  Pt. noting she feels better as compared to last visit.      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  Yes    Pain Score  2     Pain Location  Knee    Pain Orientation  Left    Pain Descriptors / Indicators  Aching    Pain Type  Surgical pain;Acute pain    Pain Radiating Towards  denies     Pain Onset  More than a month ago    Pain Frequency  Intermittent    Aggravating Factors   toe-off pushing while walking fast     Multiple Pain Sites  No         [facility] PT Assessment - 04/28/18 0001      Observation/Other Assessments   Focus on Therapeutic Outcomes (FOTO)   47% (53% limitation)                   [facility] Adult PT Treatment/Exercise - 04/28/18 1042      Knee/Hip Exercises: Stretches   Passive Hamstring Stretch  Left;1 rep;30 seconds    Passive Hamstring Stretch Limitations  strap + GS      Knee/Hip Exercises: Aerobic   Recumbent Bike  L2 x 6 min      Knee/Hip Exercises: Machines for Strengthening   Cybex Knee Extension  --   Pt. still noting challange with 15#    Cybex Knee Flexion  B con/L ecc: 15# x 15 reps       Knee/Hip Exercises: Standing   Heel Raises  15 reps;Both;Left    Heel Raises Limitations  B Con/L ecc   at UBE   Step Down  Left;Step Height: 6";Hand Hold: 1;2 sets;10 reps    Step Down Limitations  on 7" step in stair well     Other Standing Knee Exercises  L single leg "mini" squat to two airex pads to chair x 10 rpes       Knee/Hip Exercises: Supine   Straight Leg Raises  Left;15 reps    Straight Leg Raises Limitations  3#    Other Supine Knee/Hip Exercises  Alternating hip flexion march with yellow TB at forefoot and heels on peanut p-ball x 10 reps each wya     Other Supine Knee/Hip Exercises  L SL bridge x 10 rpes                PT Short Term Goals - 02/23/18 0845      PT SHORT TERM GOAL #1   Title  Pt will be  independent with initial HEP    Status  Achieved      PT SHORT TERM GOAL #2   Title  Pt will have 90 degrees of L knee AROM flexion    Status  Achieved        PT Long Term Goals - 04/23/18 1031      PT LONG TERM GOAL #1   Title  Pt to be independent with advanced HEP    Status  Partially Met      PT LONG TERM GOAL #2   Title  Patient to demonstrate 0-120 degrees of AROM at L knee to improve gait and abilty to perform ADLs independently    Status  On-going      PT LONG TERM GOAL #3   Title  Patient to report ability to take care of animals and garden independently with no increased pain.     Status  Partially Met      PT LONG TERM GOAL #4   Title  Pt to report pain at worst 4/10 with activity     Status  Partially Met      PT LONG TERM GOAL #5   Title  Pt will demonstrate normal gait pattern with LRAD in order to improve functional mobility and decrease fall risk    Status  Achieved      PT LONG TERM GOAL #6   Title  Pt will have LE strength globally of 4+/5 to improve functional mobility and perform ADLs and job-related activities safely    Status  Partially Met      PT LONG TERM GOAL #7   Title  Pt will ascend/descend stairs reciprocally with good gait pattern to allow access to all levels of new home    Status  Partially Met            Plan - 04/28/18 1046    Clinical Impression Statement  Pt. reporting she feels better today as compared to last visit.  Session focusing on L quad eccentric strengthening, L hip flexion, and L knee flexion strengthening to target remaining deficits.  Pt. still having difficulty controlling L eccentric step down at  7" height and maintaining neutral hip positioning.  Tolerated addition of L SL bridge well today without issue.  Will continue to progress toward goals.      PT Treatment/Interventions  ADLs/Self Care Home Management;Cryotherapy;Electrical Stimulation;Iontophoresis 53m/ml Dexamethasone;Moist Heat;Ultrasound;DME  Instruction;Gait training;Stair training;Functional mobility training;Therapeutic activities;Therapeutic exercise;Balance training;Neuromuscular re-education;Patient/family education;Manual techniques;Scar mobilization;Passive range of motion;Dry needling;Taping;Vasopneumatic Device    Consulted and Agree with Plan of Care  Patient       Patient will benefit from skilled therapeutic intervention in order to improve the following deficits and impairments:  Abnormal gait, Decreased skin integrity, Hypomobility, Impaired sensation, Increased edema, Decreased activity tolerance, Decreased strength, Increased fascial restricitons, Pain, Decreased mobility, Difficulty walking, Increased muscle spasms, Decreased range of motion, Improper body mechanics, Impaired flexibility, Decreased balance  Visit Diagnosis: Acute pain of left knee  Difficulty in walking, not elsewhere classified  Other symptoms and signs involving the musculoskeletal system  Stiffness of left knee, not elsewhere classified  Muscle weakness (generalized)  Other abnormalities of gait and mobility     Problem List Patient Active Problem List   Diagnosis Date Noted  . S/P left unicompartmental knee replacement 01/13/2018  . OA (osteoarthritis) of knee 01/21/2017  . Left knee pain 04/19/2014  . BRONCHITIS, ACUTE 07/21/2010    MBess Harvest PTA 04/28/18 6:21 PM   [facility] [address] [place] [address]Phone: [phone]  Fax:  [phone] Name: JTAYIA STONESIFERMRN: [id number] of Birth: 1Jul 07, 1957

## 2018-04-30 ENCOUNTER — Ambulatory Visit: Payer: 59

## 2018-04-30 DIAGNOSIS — R2689 Other abnormalities of gait and mobility: Secondary | ICD-10-CM

## 2018-04-30 DIAGNOSIS — M6281 Muscle weakness (generalized): Secondary | ICD-10-CM | POA: Diagnosis not present

## 2018-04-30 DIAGNOSIS — M25562 Pain in left knee: Secondary | ICD-10-CM | POA: Diagnosis not present

## 2018-04-30 DIAGNOSIS — R262 Difficulty in walking, not elsewhere classified: Secondary | ICD-10-CM

## 2018-04-30 DIAGNOSIS — M25662 Stiffness of left knee, not elsewhere classified: Secondary | ICD-10-CM

## 2018-04-30 DIAGNOSIS — R29898 Other symptoms and signs involving the musculoskeletal system: Secondary | ICD-10-CM

## 2018-04-30 NOTE — Therapy (Addendum)
[facility] [address]  [place], [place], [address] Phone: [phone]   Fax:  [phone]  Physical Therapy Treatment / Discharge Summary  Patient Details  Name: Joanna Willis MRN: [id number] Date of Birth: 06/09/1956 Referring Provider (PT): Theresa Duty, PA-C for [name], MD   Encounter Date: 04/30/2018  PT End of Session - 04/30/18 0941    Visit Number  32    Number of Visits  38    Date for PT Re-Evaluation  05/21/18    Authorization Type  [facility]    PT Start Time  0932    PT Stop Time  1015    PT Time Calculation (min)  43 min    Activity Tolerance  Patient tolerated treatment well    Behavior During Therapy  [facility] for tasks assessed/performed       Past Medical History:  Diagnosis Date  . Arthritis   . Depression   . GERD (gastroesophageal reflux disease)   . Hypercholesteremia   . PONV (postoperative nausea and vomiting)   . Postmenopausal   . Wears glasses     Past Surgical History:  Procedure Laterality Date  . ABDOMINAL HYSTERECTOMY  2005   did not remove ovaries  . CESAREAN SECTION  2000  . COLONOSCOPY    . CONVERSION TO TOTAL KNEE Left 01/13/2018   Procedure: Revision of Left knee unicompartmental arthroplasty to total knee arthroplasty;  Surgeon: [name], MD;  [facility];  Service: Orthopedics;  Laterality: Left;  . FOOT ARTHRODESIS  1995   right  . KNEE ARTHROSCOPY WITH MEDIAL MENISECTOMY Left 05/17/2014   Procedure: LEFT ARTHROSCOPY KNEE WITH PARTIAL MEDIAL MENISECTOMY, CHONDROPLASTY OF LATERAL FEMORAL CONDYLE;  Surgeon: Alta Corning, MD;  Location: [facility];  Service: Orthopedics;  Laterality: Left;  . PARTIAL KNEE ARTHROPLASTY Left 01/21/2017   Procedure: UNICOMPARTMENTAL LEFT KNEE;  Surgeon: [name], MD;  [facility];  Service: Orthopedics;  Laterality: Left;  . Revision left knee     Dr. Wynelle Link 01-13-18    There were no vitals  filed for this visit.  Subjective Assessment - 04/30/18 0936    Subjective  Pt. noting some L glute soreness after last visit which has lasted two days.      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  Yes    Pain Score  4     Pain Location  Knee    Pain Orientation  Left    Pain Descriptors / Indicators  Aching    Pain Type  Surgical pain;Acute pain    Pain Onset  More than a month ago         [facility] PT Assessment - 04/30/18 0952      AROM   AROM Assessment Site  Knee    Right/Left Knee  Left    Left Knee Extension  2    Left Knee Flexion  106      PROM   PROM Assessment Site  Knee    Right/Left Knee  Left    Left Knee Extension  1    Left Knee Flexion  111      Strength   Right Hip Flexion  5/5    Right Hip Extension  4+/5    Right Hip ABduction  5/5    Right Hip ADduction  5/5    Left Hip Flexion  4+/5   tested in sidelying due to pt. knee pain in seated    Left Hip Extension  4+/5    Left Hip ABduction  5/5    Left Hip ADduction  4+/5    Right/Left Knee  Right;Left    Right Knee Flexion  5/5    Right Knee Extension  5/5    Left Knee Flexion  4/5    Left Knee Extension  4+/5                   [facility] Adult PT Treatment/Exercise - 04/30/18 1008      Ambulation/Gait   Stairs  Yes    Stairs Assistance  6: Modified independent (Device/Increase time)    Stair Management Technique  One rail Right    Number of Stairs  14    Height of Stairs  7    Gait Comments  Decreased use of hip hiking while descending with cueing provided for full eccentric L quad lowering and cueing provided with ascending to avoid hip hiking and increased hip/knee flexion      Knee/Hip Exercises: Stretches   Passive Hamstring Stretch  Left;1 rep;30 seconds    Passive Hamstring Stretch Limitations  strap    Knee: Self-Stretch to increase Flexion  Left    Knee: Self-Stretch Limitations  5" x 10 reps lunge stretch on treadmill      Other Knee/Hip Stretches  L KTOS stretch 3 x 30 sec seated and supine       Knee/Hip Exercises: Aerobic   Recumbent Bike  L2 x 7 min      Knee/Hip Exercises: Standing   Step Down  Left;Hand Hold: 2;Step Height: 8"   x 12 reps    Step Down Limitations  2 HH support light on machine    Other Standing Knee Exercises  B side stepping with green TB at ankles 2 x 25 ft each      Manual Therapy   Manual Therapy  Joint mobilization    Manual therapy comments  supine     Joint Mobilization  L knee A/P mobs for improved ROM; L patellar mobs all directions for improved ROM     Soft tissue mobilization  STM to L HS + glute                PT Short Term Goals - 02/23/18 0845      PT SHORT TERM GOAL #1   Title  Pt will be independent with initial HEP    Status  Achieved      PT SHORT TERM GOAL #2   Title  Pt will have 90 degrees of L knee AROM flexion    Status  Achieved        PT Long Term Goals - 04/30/18 1154      PT LONG TERM GOAL #1   Title  Pt to be independent with advanced HEP    Status  Partially Met      PT LONG TERM GOAL #2   Title  Patient to demonstrate 0-120 degrees of AROM at L knee to improve gait and abilty to perform ADLs independently    Status  On-going      PT LONG TERM GOAL #3   Title  Patient to report ability to take care of animals and garden independently with no increased pain.     Status  Partially Met      PT LONG TERM GOAL #4   Title  Pt  to report pain at worst 4/10 with activity     Status  Partially Met      PT LONG TERM GOAL #5   Title  Pt will demonstrate normal gait pattern with LRAD in order to improve functional mobility and decrease fall risk    Status  Achieved      PT LONG TERM GOAL #6   Title  Pt will have LE strength globally of 4+/5 to improve functional mobility and perform ADLs and job-related activities safely    Status  Partially Met      PT LONG TERM GOAL #7   Title  Pt will ascend/descend stairs reciprocally with  good gait pattern to allow access to all levels of new home    Status  Partially Met            Plan - 04/30/18 0946    Clinical Impression Statement  Pt. able to demo improved walking pattern today noting she is now able to walk at full speed without calf pain and able to demo even stride, heel strike.  Still with slight hip hike/hip drop with stair navigation likely due to slight ongoing limitation with quad strength and proximal hip stability.  Pt. noting pain rising to 5/10 at worst now with activities such as going down stairs and taking care of animal.  Making progress toward strength goal with MMT today able to meet for all groups at 4+/5 or greater with exception of L HS strength still at 4/5.  Pt. noting some L HS muscular soreness today after last visit which may have contributed to strength deficit with MMT.  Pt. ROM able to progress to 1-111 dg PROM, and 2-106 AROM today.  Encouraged pt. to start side stepping with red looped TB at ankles to further target remaining proximal hip weakness and pt. still performing 8" step-down activity at home which she feels is improving her control with descending stairs.  Pt. progressing well toward goals at this point.      PT Treatment/Interventions  ADLs/Self Care Home Management;Cryotherapy;Electrical Stimulation;Iontophoresis 83m/ml Dexamethasone;Moist Heat;Ultrasound;DME Instruction;Gait training;Stair training;Functional mobility training;Therapeutic activities;Therapeutic exercise;Balance training;Neuromuscular re-education;Patient/family education;Manual techniques;Scar mobilization;Passive range of motion;Dry needling;Taping;Vasopneumatic Device    Consulted and Agree with Plan of Care  Patient       Patient will benefit from skilled therapeutic intervention in order to improve the following deficits and impairments:  Abnormal gait, Decreased skin integrity, Hypomobility, Impaired sensation, Increased edema, Decreased activity tolerance,  Decreased strength, Increased fascial restricitons, Pain, Decreased mobility, Difficulty walking, Increased muscle spasms, Decreased range of motion, Improper body mechanics, Impaired flexibility, Decreased balance  Visit Diagnosis: Acute pain of left knee  Difficulty in walking, not elsewhere classified  Other symptoms and signs involving the musculoskeletal system  Stiffness of left knee, not elsewhere classified  Muscle weakness (generalized)  Other abnormalities of gait and mobility     Problem List Patient Active Problem List   Diagnosis Date Noted  . S/P left unicompartmental knee replacement 01/13/2018  . OA (osteoarthritis) of knee 01/21/2017  . Left knee pain 04/19/2014  . BRONCHITIS, ACUTE 07/21/2010    MBess Harvest PTA 04/30/18 12:04 PM   [facility] [address] [place] [address]Phone: [phone]  Fax:  [phone] Name: JAALAYSIA LIGGINSMRN: [id number] of Birth: 111-09-1955 PHYSICAL THERAPY DISCHARGE SUMMARY  Visits from Start of Care: 32  Current functional level related to goals / functional outcomes:  Refer to above clinical impression for status as of last visit on 04/30/18. Pt was seen by MD on 05/04/18 and released from PT at that time.   Remaining deficits:   As above.   Education / Equipment:   HEP  Plan: Patient agrees to discharge.  Patient goals were partially met. Patient is being discharged due to the physician's request.  ?????     Percival Spanish, PT, MPT 06/01/18, 10:04 AM  [facility] [address]  [place], [place], [address] Phone: [phone]   Fax:  [phone]

## 2018-05-05 ENCOUNTER — Ambulatory Visit: Payer: 59 | Admitting: Physical Therapy

## 2018-05-07 ENCOUNTER — Encounter: Payer: Self-pay | Admitting: Physical Therapy

## 2018-05-07 DIAGNOSIS — Z1231 Encounter for screening mammogram for malignant neoplasm of breast: Secondary | ICD-10-CM | POA: Diagnosis not present

## 2018-05-12 ENCOUNTER — Encounter: Payer: 59 | Admitting: Physical Therapy

## 2018-05-18 MED FILL — rOPINIRole HCL 0.25 MG TABS: 0.25 | 90 days supply | Qty: 90 | Fill #1

## 2018-05-18 MED FILL — GABAPENTIN 300 MG CAPSULE: 300 | 30 days supply | Qty: 30 | Fill #0

## 2018-05-19 ENCOUNTER — Encounter: Payer: 59 | Admitting: Physical Therapy

## 2018-05-31 MED FILL — FLUTICASONE PROP 50 MCG SPR: 50 | 30 days supply | Qty: 16 | Fill #2

## 2018-07-02 DIAGNOSIS — Z96652 Presence of left artificial knee joint: Secondary | ICD-10-CM | POA: Diagnosis not present

## 2018-07-02 DIAGNOSIS — Z471 Aftercare following joint replacement surgery: Secondary | ICD-10-CM | POA: Diagnosis not present

## 2018-07-05 MED FILL — ATORVASTATIN CALCIUM 20 MG: 20 | 90 days supply | Qty: 90 | Fill #1

## 2018-07-05 MED FILL — ESTRADIOL 0.025 MG PATCH: 0.025 | 84 days supply | Qty: 24 | Fill #1

## 2018-07-06 ENCOUNTER — Encounter (HOSPITAL_COMMUNITY): Payer: Self-pay | Admitting: *Deleted

## 2018-07-06 ENCOUNTER — Other Ambulatory Visit: Payer: Self-pay

## 2018-07-20 NOTE — Anesthesia Preprocedure Evaluation (Addendum)
Anesthesia Evaluation  Patient identified by MRN, date of birth, ID band Patient awake    Reviewed: Allergy & Precautions, NPO status , Patient's Chart, lab work & pertinent test results  History of Anesthesia Complications (+) PONV and history of anesthetic complications  Airway Mallampati: II  TM Distance: >3 FB Neck ROM: Full    Dental  (+) Dental Advisory Given, Teeth Intact   Pulmonary neg pulmonary ROS,    breath sounds clear to auscultation       Cardiovascular (-) anginanegative cardio ROS   Rhythm:Regular Rate:Normal     Neuro/Psych PSYCHIATRIC DISORDERS Depression negative neurological ROS     GI/Hepatic Neg liver ROS, GERD  Medicated and Controlled,  Endo/Other  negative endocrine ROS  Renal/GU negative Renal ROS     Musculoskeletal  (+) Arthritis ,   Abdominal   Peds  Hematology negative hematology ROS (+)   Anesthesia Other Findings   Reproductive/Obstetrics                            Anesthesia Physical Anesthesia Plan  ASA: II  Anesthesia Plan: General   Post-op Pain Management:    Induction: Intravenous  PONV Risk Score and Plan: 4 or greater and Treatment may vary due to age or medical condition, Ondansetron and Midazolam  Airway Management Planned: Natural Airway and Mask  Additional Equipment: None  Intra-op Plan:   Post-operative Plan:   Informed Consent: I have reviewed the patients History and Physical, chart, labs and discussed the procedure including the risks, benefits and alternatives for the proposed anesthesia with the patient or authorized representative who has indicated his/her understanding and acceptance.     Dental advisory given  Plan Discussed with: CRNA and Anesthesiologist  Anesthesia Plan Comments:       Anesthesia Quick Evaluation

## 2018-07-21 ENCOUNTER — Ambulatory Visit (HOSPITAL_COMMUNITY): Payer: 59 | Admitting: Anesthesiology

## 2018-07-21 ENCOUNTER — Encounter (HOSPITAL_COMMUNITY)
Admission: RE | Disposition: A | Discharge status: Home / Self Care | Payer: Self-pay | Source: Home / Self Care | Attending: Orthopedic Surgery

## 2018-07-21 ENCOUNTER — Encounter (HOSPITAL_COMMUNITY): Payer: Self-pay | Admitting: *Deleted

## 2018-07-21 ENCOUNTER — Other Ambulatory Visit: Payer: Self-pay

## 2018-07-21 ENCOUNTER — Ambulatory Visit (HOSPITAL_COMMUNITY)
Admission: RE | Admit: 2018-07-21 | Discharge: 2018-07-21 | Disposition: A | Discharge status: Home / Self Care | Payer: 59 | Attending: Orthopedic Surgery | Admitting: Orthopedic Surgery

## 2018-07-21 DIAGNOSIS — F329 Major depressive disorder, single episode, unspecified: Secondary | ICD-10-CM | POA: Diagnosis not present

## 2018-07-21 DIAGNOSIS — Z791 Long term (current) use of non-steroidal anti-inflammatories (NSAID): Secondary | ICD-10-CM | POA: Diagnosis not present

## 2018-07-21 DIAGNOSIS — T85828A Fibrosis due to other internal prosthetic devices, implants and grafts, initial encounter: Secondary | ICD-10-CM | POA: Diagnosis present

## 2018-07-21 DIAGNOSIS — T8482XA Fibrosis due to internal orthopedic prosthetic devices, implants and grafts, initial encounter: Secondary | ICD-10-CM | POA: Insufficient documentation

## 2018-07-21 DIAGNOSIS — X58XXXA Exposure to other specified factors, initial encounter: Secondary | ICD-10-CM | POA: Insufficient documentation

## 2018-07-21 DIAGNOSIS — Z96652 Presence of left artificial knee joint: Secondary | ICD-10-CM | POA: Diagnosis not present

## 2018-07-21 DIAGNOSIS — Z79899 Other long term (current) drug therapy: Secondary | ICD-10-CM | POA: Diagnosis not present

## 2018-07-21 DIAGNOSIS — M24662 Ankylosis, left knee: Secondary | ICD-10-CM | POA: Diagnosis not present

## 2018-07-21 DIAGNOSIS — K219 Gastro-esophageal reflux disease without esophagitis: Secondary | ICD-10-CM | POA: Diagnosis not present

## 2018-07-21 DIAGNOSIS — T8482XD Fibrosis due to internal orthopedic prosthetic devices, implants and grafts, subsequent encounter: Secondary | ICD-10-CM

## 2018-07-21 DIAGNOSIS — E7800 Pure hypercholesterolemia, unspecified: Secondary | ICD-10-CM | POA: Diagnosis not present

## 2018-07-21 DIAGNOSIS — Z90710 Acquired absence of both cervix and uterus: Secondary | ICD-10-CM | POA: Insufficient documentation

## 2018-07-21 DIAGNOSIS — M1990 Unspecified osteoarthritis, unspecified site: Secondary | ICD-10-CM | POA: Insufficient documentation

## 2018-07-21 HISTORY — PX: KNEE CLOSED REDUCTION: SHX995

## 2018-07-21 LAB — CBC
HCT: 44.5 % (ref 36.0–46.0)
Hemoglobin: 13.9 g/dL (ref 12.0–15.0)
MCH: 30.2 pg (ref 26.0–34.0)
MCHC: 31.2 g/dL (ref 30.0–36.0)
MCV: 96.7 fL (ref 80.0–100.0)
Platelets: 252 10*3/uL (ref 150–400)
RBC: 4.6 MIL/uL (ref 3.87–5.11)
RDW: 13.3 % (ref 11.5–15.5)
WBC: 4.8 10*3/uL (ref 4.0–10.5)
nRBC: 0 % (ref 0.0–0.2)

## 2018-07-21 SURGERY — MANIPULATION, KNEE, CLOSED
Anesthesia: General | Site: Knee | Laterality: Left

## 2018-07-21 MED ORDER — OXYCODONE HCL 5 MG/5ML PO SOLN
5.0000 mg | Freq: Once | ORAL | Status: AC | PRN
  Filled 2018-07-21: qty 5

## 2018-07-21 MED ORDER — MIDAZOLAM HCL 5 MG/5ML IJ SOLN
INTRAMUSCULAR | Status: DC | PRN
  Administered 2018-07-21: 2 mg via INTRAVENOUS

## 2018-07-21 MED ORDER — FENTANYL CITRATE (PF) 100 MCG/2ML IJ SOLN: 25.0000 ug | INTRAMUSCULAR | Status: DC | PRN

## 2018-07-21 MED ORDER — FENTANYL CITRATE (PF) 100 MCG/2ML IJ SOLN
INTRAMUSCULAR | Status: DC | PRN
  Administered 2018-07-21: 50 ug via INTRAVENOUS

## 2018-07-21 MED ORDER — MIDAZOLAM HCL 2 MG/2ML IJ SOLN
INTRAMUSCULAR | Status: AC
  Filled 2018-07-21: qty 2

## 2018-07-21 MED ORDER — OXYCODONE HCL 5 MG PO TABS
5.0000 mg | ORAL_TABLET | Freq: Once | ORAL | Status: AC | PRN
  Administered 2018-07-21: 5 mg via ORAL

## 2018-07-21 MED ORDER — LIDOCAINE 2% (20 MG/ML) 5 ML SYRINGE
INTRAMUSCULAR | Status: AC
  Filled 2018-07-21: qty 5

## 2018-07-21 MED ORDER — POVIDONE-IODINE 10 % EX SWAB: 2.0000 "application " | Freq: Once | CUTANEOUS | Status: DC

## 2018-07-21 MED ORDER — SODIUM CHLORIDE 0.9 % IV SOLN: INTRAVENOUS | Status: DC

## 2018-07-21 MED ORDER — EPHEDRINE 5 MG/ML INJ
INTRAVENOUS | Status: AC
  Filled 2018-07-21: qty 10

## 2018-07-21 MED ORDER — PROPOFOL 10 MG/ML IV BOLUS
INTRAVENOUS | Status: DC | PRN
  Administered 2018-07-21: 200 mg via INTRAVENOUS

## 2018-07-21 MED ORDER — OXYCODONE HCL 5 MG PO TABS
ORAL_TABLET | ORAL | Status: AC
  Filled 2018-07-21: qty 1

## 2018-07-21 MED ORDER — PROMETHAZINE HCL 25 MG/ML IJ SOLN: 6.2500 mg | INTRAMUSCULAR | Status: DC | PRN

## 2018-07-21 MED ORDER — LACTATED RINGERS IV SOLN
INTRAVENOUS | Status: DC
  Administered 2018-07-21: 06:00:00 via INTRAVENOUS

## 2018-07-21 MED ORDER — FENTANYL CITRATE (PF) 100 MCG/2ML IJ SOLN
INTRAMUSCULAR | Status: AC
  Filled 2018-07-21: qty 2

## 2018-07-21 MED ORDER — PROPOFOL 10 MG/ML IV BOLUS
INTRAVENOUS | Status: AC
  Filled 2018-07-21: qty 20

## 2018-07-21 MED ORDER — EPHEDRINE SULFATE-NACL 50-0.9 MG/10ML-% IV SOSY
PREFILLED_SYRINGE | INTRAVENOUS | Status: DC | PRN
  Administered 2018-07-21: 10 mg via INTRAVENOUS

## 2018-07-21 MED ORDER — CHLORHEXIDINE GLUCONATE 4 % EX LIQD: 60.0000 mL | Freq: Once | CUTANEOUS | Status: DC

## 2018-07-21 MED ORDER — LIDOCAINE 2% (20 MG/ML) 5 ML SYRINGE
INTRAMUSCULAR | Status: DC | PRN
  Administered 2018-07-21: 10 mg via INTRAVENOUS

## 2018-07-21 NOTE — H&P (Signed)
CC- Joanna Willis is a 63 y.o. female who presents with left knee stiffness.  HPI- . Knee Pain: Patient presents stiffness involving the  left knee. Onset of the symptoms was several months ago. Inciting event: She had a left total knee arthroplasty in 12/2017 and has had difficulty gaining flexion with adequate physical therapy. She has maxec out at 100 degrees of flexion and can not do activities she desires. She presents now for closed manipulation.   Past Medical History:  Diagnosis Date  . Arthritis   . Depression   . GERD (gastroesophageal reflux disease)   . Hypercholesteremia   . PONV (postoperative nausea and vomiting)   . Postmenopausal   . Wears glasses     Past Surgical History:  Procedure Laterality Date  . ABDOMINAL HYSTERECTOMY  2005   did not remove ovaries  . CESAREAN SECTION  2000  . COLONOSCOPY    . CONVERSION TO TOTAL KNEE Left 01/13/2018   Procedure: Revision of Left knee unicompartmental arthroplasty to total knee arthroplasty;  Surgeon: [name], MD;  [facility];  Service: Orthopedics;  Laterality: Left;  . FOOT ARTHRODESIS  1995   right  . KNEE ARTHROSCOPY WITH MEDIAL MENISECTOMY Left 05/17/2014   Procedure: LEFT ARTHROSCOPY KNEE WITH PARTIAL MEDIAL MENISECTOMY, CHONDROPLASTY OF LATERAL FEMORAL CONDYLE;  Surgeon: Alta Corning, MD;  Location: [place];  Service: Orthopedics;  Laterality: Left;  . PARTIAL KNEE ARTHROPLASTY Left 01/21/2017   Procedure: UNICOMPARTMENTAL LEFT KNEE;  Surgeon: [name], MD;  [facility];  Service: Orthopedics;  Laterality: Left;  . Revision left knee     Dr. Wynelle Link 01-13-18    Prior to Admission medications   Medication Sig Start Date End Date Taking? Authorizing Provider  atorvastatin (LIPITOR) 20 MG tablet Take 20 mg by mouth at bedtime.    Yes [provider]  citalopram (CELEXA) 20 MG tablet Take 20 mg by mouth at bedtime.    Yes [provider]  estradiol (VIVELLE-DOT)  0.025 MG/24HR Place 1 patch onto the skin 2 (two) times a week.   Yes [provider]  fluticasone (FLONASE) 50 MCG/ACT nasal spray Place 1 spray into both nostrils daily as needed for allergies or rhinitis.   Yes [provider]  ibuprofen (ADVIL,MOTRIN) 200 MG tablet Take 400 mg by mouth every 6 (six) hours as needed for headache or moderate pain.   Yes [provider]  omeprazole (PRILOSEC) 20 MG capsule Take 20 mg by mouth daily.   Yes [provider]  acetaminophen (TYLENOL) 500 MG tablet Take 1,000 mg by mouth every 6 (six) hours as needed for moderate pain or headache.     [provider]   LEFT KNEE EXAM antalgic gait, no effusion, reduced range of motion (0-100), collateral ligaments intact  Physical Examination: General appearance - alert, well appearing, and in no distress Mental status - alert, oriented to person, place, and time Chest - clear to auscultation, no wheezes, rales or rhonchi, symmetric air entry Heart - normal rate, regular rhythm, normal S1, S2, no murmurs, rubs, clicks or gallops Abdomen - soft, nontender, nondistended, no masses or organomegaly Neurological - alert, oriented, normal speech, no focal findings or movement disorder noted   Asessment/Plan--- Left knee arthrofibrosis- - Plan left knee closed maniipulation. Procedure risks and potential comps discussed with patient who elects to proceed. Goals are decreased pain and increased function with a high likelihood of achieving both

## 2018-07-21 NOTE — Anesthesia Postprocedure Evaluation (Signed)
Anesthesia Post Note  Patient: Joanna Willis  Procedure(s) Performed: CLOSED MANIPULATION KNEE (Left Knee)     Patient location during evaluation: PACU Anesthesia Type: General Level of consciousness: awake and alert Pain management: pain level controlled Vital Signs Assessment: post-procedure vital signs reviewed and stable Respiratory status: spontaneous breathing, nonlabored ventilation and respiratory function stable Cardiovascular status: blood pressure returned to baseline and stable Postop Assessment: no apparent nausea or vomiting Anesthetic complications: no    Last Vitals:  Vitals:   07/21/18 0803 07/21/18 0826  BP: 120/83 126/72  Pulse: 67   Resp: 16 18  Temp: (!) 36.2 C (!) 36.3 C  SpO2: 100% 98%    Last Pain:  Vitals:   07/21/18 0826  TempSrc:   PainSc: [place]

## 2018-07-21 NOTE — Anesthesia Procedure Notes (Signed)
Procedure Name: General with mask airway Date/Time: 07/21/2018 7:05 AM Performed by: Lind Covert, CRNA Pre-anesthesia Checklist: Patient identified, Suction available, Emergency Drugs available, Patient being monitored and Timeout performed Patient Re-evaluated:Patient Re-evaluated prior to induction Oxygen Delivery Method: Circle system utilized Preoxygenation: Pre-oxygenation with 100% oxygen Induction Type: IV induction Ventilation: Mask ventilation without difficulty Placement Confirmation: positive ETCO2 and breath sounds checked- equal and bilateral

## 2018-07-21 NOTE — Transfer of Care (Signed)
Immediate Anesthesia Transfer of Care Note  Patient: Joanna Willis  Procedure(s) Performed: CLOSED MANIPULATION KNEE (Left Knee)  Patient Location: PACU  Anesthesia Type:General  Level of Consciousness: sedated  Airway & Oxygen Therapy: Patient Spontanous Breathing and Patient connected to face mask oxygen  Post-op Assessment: Report given to RN and Post -op Vital signs reviewed and stable  Post vital signs: Reviewed and stable  Last Vitals:  Vitals Value Taken Time  BP 144/82 07/21/2018  7:21 AM  Temp    Pulse 59 07/21/2018  7:22 AM  Resp 20 07/21/2018  7:22 AM  SpO2 100 % 07/21/2018  7:22 AM  Vitals shown include unvalidated device data.  Last Pain:  Vitals:   07/21/18 0603  TempSrc:   PainSc: 0-No pain      Patients Stated Pain Goal: 3 (79/15/04 1364)  Complications: No apparent anesthesia complications

## 2018-07-21 NOTE — Anesthesia Procedure Notes (Signed)
Performed by: Maury Groninger R, CRNA       

## 2018-07-21 NOTE — Discharge Instructions (Signed)
Resume activities as tolerated  Resume your exercise bike tomorrow  I was able to flex your knee 130 degrees

## 2018-07-21 NOTE — Op Note (Signed)
  OPERATIVE REPORT   PREOPERATIVE DIAGNOSIS: Arthrofibrosis, Left  knee.   POSTOPERATIVE DIAGNOSIS: Arthrofibrosis, Left knee.   PROCEDURE:  Left  knee closed manipulation.   SURGEON: [name], MD   ASSISTANT: None.   ANESTHESIA: General.   COMPLICATIONS: None.   CONDITION: Stable to Recovery.   Pre-manipulation range of motion is 5-100.  Post-manipulation range of  Motion is 0-130  PROCEDURE IN DETAIL: After successful administration of general  anesthetic, exam under anesthesia was performed showing range of motion  5-100 degrees. I then placed my chest against the proximal tibia,  flexing the knee with audible lysis of adhesions. I was easily able to  get the knee flexed to 130  degrees. I then put the knee back in extension and with some  patellar manipulation and gentle pressure got to full  Extension.The patient was subsequently awakened and transported to Recovery in  stable condition.

## 2018-07-22 ENCOUNTER — Encounter (HOSPITAL_COMMUNITY): Payer: Self-pay | Admitting: Orthopedic Surgery

## 2018-07-29 MED FILL — OMEPRAZOLE 20 MG CPDR: 20 | 90 days supply | Qty: 90 | Fill #0

## 2018-08-17 MED FILL — FLUTICASONE PROP 50 MCG SPR: 50 | 30 days supply | Qty: 16 | Fill #0

## 2018-09-27 MED FILL — ATORVASTATIN 20 MG TABLET: 20 | 90 days supply | Qty: 90 | Fill #0

## 2018-09-27 MED FILL — ESTRADIOL 0.025 MG PATCH: 0.025 | 84 days supply | Qty: 24 | Fill #0

## 2018-09-27 MED FILL — CITALOPRAM HBR 20 MG TABLET: 20 | 90 days supply | Qty: 90 | Fill #0

## 2018-10-25 MED FILL — OMEPRAZOLE 20 MG CPDR: 20 | 90 days supply | Qty: 90 | Fill #0

## 2018-11-30 MED FILL — CLINDAMYCIN HCL 150 MG CAPS: 150 | 1 days supply | Qty: 8 | Fill #0

## 2018-12-29 DIAGNOSIS — Z5189 Encounter for other specified aftercare: Secondary | ICD-10-CM | POA: Insufficient documentation

## 2018-12-30 DIAGNOSIS — Z471 Aftercare following joint replacement surgery: Secondary | ICD-10-CM | POA: Diagnosis not present

## 2018-12-30 DIAGNOSIS — Z96652 Presence of left artificial knee joint: Secondary | ICD-10-CM | POA: Diagnosis not present

## 2019-01-03 MED FILL — MELOXICAM 15 MG TABLET: 15 | 30 days supply | Qty: 30 | Fill #0

## 2019-01-03 MED FILL — DOTTI 0.025 MG/24HR PTTW: 0.025 | 84 days supply | Qty: 24 | Fill #1

## 2019-01-03 MED FILL — ATORVASTATIN 20 MG TABLET: 20 | 90 days supply | Qty: 90 | Fill #1

## 2019-01-18 ENCOUNTER — Ambulatory Visit: Payer: 59 | Admitting: Sports Medicine

## 2019-01-18 ENCOUNTER — Encounter: Payer: Self-pay | Admitting: Sports Medicine

## 2019-01-18 ENCOUNTER — Other Ambulatory Visit: Payer: Self-pay

## 2019-01-18 VITALS — BP 114/70 | Ht 66.0 in | Wt 155.0 lb

## 2019-01-18 DIAGNOSIS — M25562 Pain in left knee: Secondary | ICD-10-CM | POA: Diagnosis not present

## 2019-01-18 NOTE — Progress Notes (Signed)
SUBJECTIVE:      Chief Complaint:  Left knee pain   HPI:  Joanna Willis is a 63 year old female presenting for left knee pain.  In July 2018 she had a partial knee arthroplasty for worsening osteoarthritis.  She completed 4 months of physical therapy, but was still having pain.  After extensive diagnostic testing, she was found to have a left tibial stress fracture.  The decision was made in July 2019 to have a TKA, originally did well postoperatively.  Her goal and passion is cycling, and she was having increased pain when cycling.  The decision was then made in January 2020 to have a arthrofibrosis.  Since that time she is having increased pain when cycling.  She is also reporting some numbness and stinging over her anterior knee.  Still the pain is on the anterior side of her knee, but she cannot pinpoint a specific location.  Aggravating factors include cycling, walking down the steps, and full weight-bear on her left leg.  After the most recent surgery, she took 6 weeks off and was not in any pain, but as soon as she started cycling the pain returned.  Alleviating factors include as needed Mobic, gabapentin 300, and ice.  10 days ago was the last time she tried cycling.  She has been told that she could have tendinitis in the knee which is contributing to her symptoms.  She has not tried physical therapy since after the TKA.  She endorses the swelling in her knee today is at baseline, and what it has looked like the last 2 years.  She denies bruising, bleeding, back pain, or radiating symptoms down her leg.  She had x-rays done in the last month.  Patient has gabapentin and Mobic at home.     ROS:     See HPI. All other reviewed systems negative.   PERTINENT  PMH / PSH FH / / SH:  Past Medical, Surgical, Social, and Family History Reviewed & Updated in the EMR.  Pertinent findings include:  Surgeries as noted above, 1995 foot arthrodesis   OBJECTIVE:  Physical Exam:  Vital signs are reviewed.   GEN:  Alert and oriented, NAD Pulm: Breathing unlabored PSY: normal mood, congruent affect  MSK: General: Alert, well-nourished, afebrile Gait: No limp, ambulated s pain Left knee: Inspection: (-) deformity, (-) genu valgus, (-) genu varus, (-) effusion, (+) swelling, muscle wasting noted at VMO Neuro: Patellar Reflex 2/4 Tenderness:  [Medial]  (+) Joint Line  (-) Tibial Tubercle  (-) MCL      (-) Medial Hamstring         [Lateral]  (-) Joint Line  (-) Fibular Head  (-) LCL      (-) ITB  (-) Lateral Hamstring      [Anterior]  (-) Quadricep  (-) Quadricep Tendon  (-) Patella      (-) MPFL  (-) LPFL  (+) Patellar Tendon      (-) Tibial Tuberosity  (-) Pes Anserine      [Posterior]  (-) Baker's Cyst  (-) Medial Gastrocnemius  (-) Lateral Gastrocnemius   Other   pain with deep squat          Range of Motion: 0-130 degrees (knee)  (+) full hip ROM Strength:    Knee Flexion: 4/5    Knee Extension: 4/5    Hip Flexion: 5/5    Hip Extension: 5/5 Special Tests:  (-) Anterior Drawer  (-) Posterior Drawer  (-) Lachman   (-)  McMurray's  (-) Valgus Stress 0deg  (-) Valgus Stress 30deg   (-) Varus Stress 0deg  (-) Varus Stress 30deg  (-) Patellar Tilt   (-) Patellofemoral Glide  (-) Patellar Apprehension  (-) Tredenlenberg     ASSESSMENT & PLAN:   1. Left knee pain Given extensive surgical history and clinical findings, we will start conservatively with at home quad exercises to increase the strength around the knee.  She is to work her way up to 3 sets of 30 repetitions, and then add ankle weight.  She was scheduled for an appointment next week for diagnostic knee ultrasound.  She will bring the x-rays to her appointment next week.  She can continue using the Mobic and gabapentin as needed, recommended the gabapentin just at night before bed.  If no improvement in symptoms we may want to consider a bone scan.   Lanier Clam, DO, ATC Sports Medicine Fellow  Patient seen and evaluated with the  sports medicine fellow.  I agree with the above plan of care.  Patient will return to the office next week for MSK ultrasound of her left knee.  She will bring her x-rays to that visit.

## 2019-01-26 ENCOUNTER — Encounter: Payer: Self-pay | Admitting: Sports Medicine

## 2019-01-26 ENCOUNTER — Other Ambulatory Visit: Payer: Self-pay

## 2019-01-26 ENCOUNTER — Ambulatory Visit: Payer: Self-pay

## 2019-01-26 ENCOUNTER — Ambulatory Visit (INDEPENDENT_AMBULATORY_CARE_PROVIDER_SITE_OTHER): Payer: 59 | Admitting: Sports Medicine

## 2019-01-26 VITALS — BP 120/70 | Wt 155.0 lb

## 2019-01-26 DIAGNOSIS — M25562 Pain in left knee: Secondary | ICD-10-CM

## 2019-01-26 MED FILL — OMEPRAZOLE 20 MG CAP: 20 | 90 days supply | Qty: 90 | Fill #1

## 2019-01-26 NOTE — Progress Notes (Signed)
Patient presents today for ultrasound of left knee. Of note, patient has TKA of L knee.  Limited L Knee [place]: MSK Limited knee ultrasound performed, suprapatellar pouch visualized in long and short axis with trace effusion in both views. Quadriceps tendon visualized in both long and short axis without abnormality. Patellar Tendon is visualized in long axis with thickening of tendon and trace hypoechoic changes visualized in short axis. Popliteal fossa was visualized, no Baker's cyst  IMPRESSION: Joint effusion with thickening of the patellar tendon, could be consistent with postoperative changes versus overuse injury  Plan: Continue strengthening exercises at home and follow-up in 6 weeks.  No cycling for at least 1 more month.  Lanier Clam, DO, ATC Sports Medicine Fellow  Dictation performed with [employer]. Please excuse typographical errors.  Patient seen and evaluated with the sports medicine fellow.  I agree with the above ultrasound findings.  I was present for the ultrasound and I agree with the plan of care.

## 2019-03-09 ENCOUNTER — Ambulatory Visit (INDEPENDENT_AMBULATORY_CARE_PROVIDER_SITE_OTHER): Payer: 59 | Admitting: Sports Medicine

## 2019-03-09 ENCOUNTER — Other Ambulatory Visit: Payer: Self-pay

## 2019-03-09 VITALS — BP 104/78 | Ht 66.0 in | Wt 160.0 lb

## 2019-03-09 DIAGNOSIS — M25562 Pain in left knee: Secondary | ICD-10-CM

## 2019-03-09 NOTE — Addendum Note (Signed)
Addended by: Jolinda Croak E on: 03/09/2019 02:29 PM   Modules accepted: Orders

## 2019-03-09 NOTE — Patient Instructions (Signed)
We have scheduled you to see Dr. [facility] [address] [place] [phone]  Appt: Tuesday September 29th @ 12:30 pm

## 2019-03-09 NOTE — Progress Notes (Signed)
   Subjective:    Patient ID: Joanna Willis, female    DOB: [birth year], 63 y.o.   MRN: [id number]  HPI   Joanna Willis comes in today for follow-up on left knee pain and stiffness.  She has noticed some improvement in strength with her home exercises.  In fact, she tells me that she is able to do isometric straight leg raises with 4 pounds of resistance quite easily.  Simple things such as getting up from a seated position and walking downstairs has become easier but she still endorses significant stiffness in the left knee, especially difficulty with knee flexion.  It is affecting her quality of life.  She is unable to ride her bike comfortably.  Please see her previous office notes for details regarding her history in regards to this left knee.  In short, she had a unicompartmental replacement in 2018 which was converted to a total knee arthroplasty in July 2019.  She developed adhesions postoperatively and underwent manipulation in January of this year but has continued to struggle.    Review of Systems    As above Objective:   Physical Exam  Well-developed, well-nourished.  No acute distress.  Awake alert and oriented x3.  Vital signs reviewed  Left knee: Patient lacks about 3 degrees of extension.  She is only able to accomplish 98 degrees of flexion.  She does have some increased tone of the VMO when compared to her previous visit.  There is no obvious effusion.  She is tender to palpation along the anterior knee diffusely.  Joint is stable grossly ligamentous exam.  Neurovascularly intact distally.      Assessment & Plan:   Chronic left knee pain and stiffness status post total knee arthroplasty and subsequent postoperative manipulation  Patient definitely has stiffness in this left knee.  I am not sure if she would benefit from a second manipulation but I recommended a referral to Dr. Mayer Camel to discuss further work-up and treatment.  In the meantime, she will continue to strengthen her quads  with her home exercises.  I am happy to see her again after Dr. Damita Dunnings referral but at this point I would defer further work-up and treatment to his discretion.  Patient agrees with this plan.

## 2019-03-29 DIAGNOSIS — Z96652 Presence of left artificial knee joint: Secondary | ICD-10-CM | POA: Diagnosis not present

## 2019-03-29 DIAGNOSIS — M25562 Pain in left knee: Secondary | ICD-10-CM | POA: Diagnosis not present

## 2019-03-30 DIAGNOSIS — E785 Hyperlipidemia, unspecified: Secondary | ICD-10-CM | POA: Diagnosis not present

## 2019-03-30 DIAGNOSIS — K219 Gastro-esophageal reflux disease without esophagitis: Secondary | ICD-10-CM | POA: Diagnosis not present

## 2019-03-30 DIAGNOSIS — M1712 Unilateral primary osteoarthritis, left knee: Secondary | ICD-10-CM | POA: Diagnosis not present

## 2019-03-30 DIAGNOSIS — F4323 Adjustment disorder with mixed anxiety and depressed mood: Secondary | ICD-10-CM | POA: Diagnosis not present

## 2019-03-30 DIAGNOSIS — G2581 Restless legs syndrome: Secondary | ICD-10-CM | POA: Diagnosis not present

## 2019-03-30 DIAGNOSIS — Z79890 Hormone replacement therapy (postmenopausal): Secondary | ICD-10-CM | POA: Diagnosis not present

## 2019-03-30 DIAGNOSIS — Z0000 Encounter for general adult medical examination without abnormal findings: Secondary | ICD-10-CM | POA: Diagnosis not present

## 2019-03-30 DIAGNOSIS — Z1159 Encounter for screening for other viral diseases: Secondary | ICD-10-CM | POA: Diagnosis not present

## 2019-04-01 MED FILL — DOTTI 0.025 MG/24HR PTTW: 0.025 | 84 days supply | Qty: 24 | Fill #0

## 2019-04-01 MED FILL — MELOXICAM 15 MG TABLET: 15 | 90 days supply | Qty: 90 | Fill #0

## 2019-04-13 MED FILL — ATORVASTATIN 20 MG TABLET: 20 | 90 days supply | Qty: 90 | Fill #0

## 2019-04-26 ENCOUNTER — Encounter: Payer: Self-pay | Admitting: Gastroenterology

## 2019-05-13 MED FILL — OMEPRAZOLE 20 MG CAP: 20 | 90 days supply | Qty: 90 | Fill #0

## 2019-06-01 ENCOUNTER — Encounter: Payer: 59 | Admitting: Gastroenterology

## 2019-06-08 MED FILL — CLINDAMYCIN HCL 150 MG CAPS: 150 | 3 days supply | Qty: 12 | Fill #0

## 2019-06-08 MED FILL — CLINDAMYCIN HCL 150 MG CAPS: 150 | 3 days supply | Qty: 12 | Fill #0 | Status: TO

## 2019-07-13 MED FILL — ATORVASTATIN 20 MG TABLET: 20 | 90 days supply | Qty: 90 | Fill #1

## 2019-07-20 DIAGNOSIS — F4323 Adjustment disorder with mixed anxiety and depressed mood: Secondary | ICD-10-CM | POA: Diagnosis not present

## 2019-07-20 DIAGNOSIS — N951 Menopausal and female climacteric states: Secondary | ICD-10-CM | POA: Diagnosis not present

## 2019-07-20 MED FILL — FLUTICASONE PROP 50 MCG SPR: 50 | 30 days supply | Qty: 16 | Fill #0

## 2019-07-20 MED FILL — CloNIDine HCL 0.1 MG TAB: 0.1 | 30 days supply | Qty: 30 | Fill #0

## 2019-07-20 MED FILL — MELOXICAM 15 MG TABLET: 15 | 90 days supply | Qty: 90 | Fill #1

## 2019-07-20 MED FILL — CloNIDine HCL 0.1 MG TAB: 0.1 | 30 days supply | Qty: 30 | Fill #0 | Status: TO

## 2019-07-20 MED FILL — ESTRADIOL 0.025 MG PATCH: 0.025 | 84 days supply | Qty: 24 | Fill #1

## 2019-07-20 MED FILL — CITALOPRAM HBR 20 MG TABLET: 20 | 90 days supply | Qty: 90 | Fill #0

## 2019-08-19 MED FILL — OMEPRAZOLE 20 MG CAP: 20 | 90 days supply | Qty: 90 | Fill #1

## 2019-08-24 DIAGNOSIS — Z1231 Encounter for screening mammogram for malignant neoplasm of breast: Secondary | ICD-10-CM | POA: Diagnosis not present

## 2019-09-09 ENCOUNTER — Encounter: Payer: Self-pay | Admitting: Gastroenterology

## 2019-09-21 DIAGNOSIS — R232 Flushing: Secondary | ICD-10-CM | POA: Diagnosis not present

## 2019-09-21 DIAGNOSIS — Z79890 Hormone replacement therapy (postmenopausal): Secondary | ICD-10-CM | POA: Diagnosis not present

## 2019-10-10 ENCOUNTER — Ambulatory Visit (AMBULATORY_SURGERY_CENTER): Payer: Self-pay | Admitting: *Deleted

## 2019-10-10 ENCOUNTER — Other Ambulatory Visit: Payer: Self-pay

## 2019-10-10 VITALS — Temp 96.2°F | Ht 66.0 in | Wt 151.0 lb

## 2019-10-10 DIAGNOSIS — Z800 Family history of malignant neoplasm of digestive organs: Secondary | ICD-10-CM

## 2019-10-10 MED ORDER — SUPREP BOWEL PREP KIT 17.5-3.13-1.6 GM/177ML PO SOLN: 1.0000 | Freq: Once | ORAL | 0 refills | Status: AC

## 2019-10-10 NOTE — Progress Notes (Signed)
No egg or soy allergy known to patient   issues with past sedation with any surgeries  or procedures- hx  PONV , no intubation problems  No diet pills per patient No home 02 use per patient  No blood thinners per patient  Pt denies issues with constipation  No A fib or A flutter  EMMI video sent to pt's e mail   Due to the COVID-19 pandemic we are asking patients to follow these guidelines. Please only bring one care partner. Please be aware that your care partner may wait in the car in the parking lot or if they feel like they will be too hot to wait in the car, they may wait in the lobby on the 4th floor. All care partners are required to wear a mask the entire time (we do not have any that we can provide them), they need to practice social distancing, and we will do a Covid check for all patient's and care partners when you arrive. Also we will check their temperature and your temperature. If the care partner waits in their car they need to stay in the parking lot the entire time and we will call them on their cell phone when the patient is ready for discharge so they can bring the car to the front of the building. Also all patient's will need to wear a mask into building.

## 2019-10-12 MED FILL — SUPREP BOWEL PREP KIT: 17.5-3.13-1 | 1 days supply | Qty: 354 | Fill #0

## 2019-10-17 MED FILL — ATORVASTATIN 20 MG TABLET: 20 | 90 days supply | Qty: 90 | Fill #0

## 2019-10-18 MED FILL — ESTRADIOL 0.025 MG PATCH: 0.025 | 84 days supply | Qty: 24 | Fill #0

## 2019-10-24 ENCOUNTER — Ambulatory Visit (AMBULATORY_SURGERY_CENTER): Payer: 59 | Admitting: Gastroenterology

## 2019-10-24 ENCOUNTER — Other Ambulatory Visit: Payer: Self-pay

## 2019-10-24 ENCOUNTER — Encounter: Payer: Self-pay | Admitting: Gastroenterology

## 2019-10-24 VITALS — BP 111/68 | HR 57 | Temp 96.6°F | Resp 11 | Ht 66.0 in | Wt 151.0 lb

## 2019-10-24 DIAGNOSIS — D128 Benign neoplasm of rectum: Secondary | ICD-10-CM

## 2019-10-24 DIAGNOSIS — Z800 Family history of malignant neoplasm of digestive organs: Secondary | ICD-10-CM | POA: Diagnosis not present

## 2019-10-24 DIAGNOSIS — K635 Polyp of colon: Secondary | ICD-10-CM | POA: Diagnosis not present

## 2019-10-24 DIAGNOSIS — K621 Rectal polyp: Secondary | ICD-10-CM

## 2019-10-24 DIAGNOSIS — Z1211 Encounter for screening for malignant neoplasm of colon: Secondary | ICD-10-CM

## 2019-10-24 DIAGNOSIS — D123 Benign neoplasm of transverse colon: Secondary | ICD-10-CM

## 2019-10-24 DIAGNOSIS — Z86010 Personal history of colonic polyps: Secondary | ICD-10-CM | POA: Diagnosis not present

## 2019-10-24 MED ORDER — SODIUM CHLORIDE 0.9 % IV SOLN: 500.0000 mL | Freq: Once | INTRAVENOUS | Status: DC

## 2019-10-24 NOTE — Patient Instructions (Signed)
YOU HAD AN ENDOSCOPIC PROCEDURE TODAY AT [facility]:   Refer to the procedure report that was given to you for any specific questions about what was found during the examination.  If the procedure report does not answer your questions, please call your gastroenterologist to clarify.  If you requested that your care partner not be given the details of your procedure findings, then the procedure report has been included in a sealed envelope for you to review at your convenience later.  YOU SHOULD EXPECT: Some feelings of bloating in the abdomen. Passage of more gas than usual.  Walking can help get rid of the air that was put into your GI tract during the procedure and reduce the bloating. If you had a lower endoscopy (such as a colonoscopy or flexible sigmoidoscopy) you may notice spotting of blood in your stool or on the toilet paper. If you underwent a bowel prep for your procedure, you may not have a normal bowel movement for a few days.  Please Note:  You might notice some irritation and congestion in your nose or some drainage.  This is from the oxygen used during your procedure.  There is no need for concern and it should clear up in a day or so.  SYMPTOMS TO REPORT IMMEDIATELY:   Following lower endoscopy (colonoscopy or flexible sigmoidoscopy):  Excessive amounts of blood in the stool  Significant tenderness or worsening of abdominal pains  Swelling of the abdomen that is new, acute  Fever of 100F or higher  gastroenterologist can be reached at any hour by calling [phone]. Do not use MyChart messaging for urgent concerns.    DIET:  We do recommend a small meal at first, but then you may proceed to your regular diet.  Drink plenty of fluids but you should avoid alcoholic beverages for 24 hours.  ACTIVITY:  You should plan to take it easy for the rest of today and you should NOT DRIVE or use heavy machinery until tomorrow (because of the sedation medicines used  during the test).    FOLLOW UP: Our staff will call the number listed on your records 48-72 hours following your procedure to check on you and address any questions or concerns that you may have regarding the information given to you following your procedure. If we do not reach you, we will leave a message.  We will attempt to reach you two times.  During this call, we will ask if you have developed any symptoms of COVID 19. If you develop any symptoms (ie: fever, flu-like symptoms, shortness of breath, cough etc.) before then, please call [phone].  If you test positive for Covid 19 in the 2 weeks post procedure, please call and report this information to [place].    If any biopsies were taken you will be contacted by phone or by letter within the next 1-3 weeks.  Please call us at [phone] if you have not heard about the biopsies in 3 weeks.    SIGNATURES/CONFIDENTIALITY: You and/or your care partner have signed paperwork which will be entered into your electronic medical record.  These signatures attest to the fact that that the information above on your After Visit Summary has been reviewed and is understood.  Full responsibility of the confidentiality of this discharge information lies with you and/or your care-partner.     Handouts were given to you on polyps and hemorrhoids. You may resume your current medications today. Await biopsy results. Please call  if any questions or concerns.   

## 2019-10-24 NOTE — Op Note (Signed)
[facility] Patient Name: Joanna Willis Procedure Date: 10/24/2019 11:37 AM MRN: [id number] Endoscopist: Mauri Pole , MD Age: 64 Referring MD:  Date of Birth: [birth year] Gender: Female Account #: [id number] Procedure:                Colonoscopy Indications:              Screening patient at increased risk: Family history                            of 1st-degree relative with colorectal cancer at                            age 71 years (or older) Medicines:                Monitored Anesthesia Care Procedure:                Pre-Anesthesia Assessment:                           - Prior to the procedure, a History and Physical                            was performed, and patient medications and                            allergies were reviewed. The patient's tolerance of                            previous anesthesia was also reviewed. The risks                            and benefits of the procedure and the sedation                            options and risks were discussed with the patient.                            All questions were answered, and informed consent                            was obtained. Prior Anticoagulants: The patient has                            taken no previous anticoagulant or antiplatelet                            agents. ASA Grade Assessment: II - A patient with                            mild systemic disease. After reviewing the risks                            and benefits, the patient was deemed in  satisfactory condition to undergo the procedure.                           After obtaining informed consent, the colonoscope                            was passed under direct vision. Throughout the                            procedure, the patient's blood pressure, pulse, and                            oxygen saturations were monitored continuously. The                            Colonoscope was introduced  through the anus and                            advanced to the the cecum, identified by                            appendiceal orifice and ileocecal valve. The                            colonoscopy was technically difficult and complex                            due to restricted mobility of the colon. The                            patient tolerated the procedure well. The quality                            of the bowel preparation was excellent. The                            ileocecal valve, appendiceal orifice, and rectum                            were photographed. Scope In: 11:45:00 AM Scope Out: 12:36:33 PM Scope Withdrawal Time: 0 hours 18 minutes 15 seconds  Total Procedure Duration: 0 hours 51 minutes 33 seconds  Findings:                 The perianal and digital rectal examinations were                            normal.                           A 8 mm polyp was found in the rectum. The polyp was                            flat. The polyp was removed with a cold snare.  Resection and retrieval were complete.                           Two sessile polyps were found in the rectum and                            transverse colon. The polyps were 1 to 2 mm in                            size. These polyps were removed with a cold biopsy                            forceps. Resection and retrieval were complete.                           Non-bleeding internal hemorrhoids were found during                            retroflexion. The hemorrhoids were small.                           The exam was otherwise without abnormality. Complications:            No immediate complications. Estimated Blood Loss:     Estimated blood loss was minimal. Impression:               - One 8 mm polyp in the rectum, removed with a cold                            snare. Resected and retrieved.                           - Two 1 to 2 mm polyps in the rectum and in the                             transverse colon, removed with a cold biopsy                            forceps. Resected and retrieved.                           - Non-bleeding internal hemorrhoids.                           - The examination was otherwise normal. Recommendation:           - Patient has a contact number available for                            emergencies. The signs and symptoms of potential                            delayed complications were discussed with the  patient. Return to normal activities tomorrow.                            Written discharge instructions were provided to the                            patient.                           - Resume previous diet.                           - Continue present medications.                           - Await pathology results.                           - Repeat colonoscopy in 3 - 5 years for                            surveillance based on pathology results. Mauri Pole, MD 10/24/2019 12:41:57 PM This report has been signed electronically.

## 2019-10-24 NOTE — Progress Notes (Addendum)
Pt did not pass flatus on her LLD side.  I asked her to roll to her RLD and dropped the [facility] a little bit, pt did expell some flatus. MAW  Pt was taken to the rest room first, before dressing at her request.  Pt said she passed a good amount of flatus in the rest room.  Her Abdomen was soft. I ASKED HER TO CONTINUE TO WORK ON PASSING MORE FLATUS D/T HAVING A TORTUOUS COLON.   She did report a small amount of bright, red, blood that came from her rectum.  I asked her to make sure she monitred the bleeding.  If increasing, clots of blood are being expelled to please call us.  Pt said she would.  No other problems noted. maw

## 2019-10-24 NOTE — Progress Notes (Signed)
A/ox3, pleased with MAC, report to RN 

## 2019-10-24 NOTE — Progress Notes (Signed)
Called to room to assist during endoscopic procedure.  Patient ID and intended procedure confirmed with present staff. Received instructions for my participation in the procedure from the performing physician.  

## 2019-10-24 NOTE — Progress Notes (Signed)
Pt's states no medical or surgical changes since previsit or office visit.  TEMP-JB  V/S-KA

## 2019-10-26 ENCOUNTER — Telehealth: Payer: Self-pay | Admitting: *Deleted

## 2019-10-26 NOTE — Telephone Encounter (Signed)
  Follow up Call-  Call back number 10/24/2019  Post procedure Call Back phone  # [phone]  Permission to leave phone message Yes  Some recent data might be hidden     Patient questions:  Do you have a fever, pain , or abdominal swelling? No. Pain Score  0 *  Have you tolerated food without any problems? Yes.    Have you been able to return to your normal activities? Yes.    Do you have any questions about your discharge instructions: Diet   No. Medications  No. Follow up visit  No.  Do you have questions or concerns about your Care? No.  Actions: * If pain score is 4 or above: No action needed, pain <4.  1. Have you developed a fever since your procedure? no  2.   Have you had an respiratory symptoms (SOB or cough) since your procedure? no  3.   Have you tested positive for COVID 19 since your procedure no  4.   Have you had any family members/close contacts diagnosed with the COVID 19 since your procedure?  no   If yes to any of these questions please route to Joylene John, RN and Erenest Rasher, RN

## 2019-10-27 ENCOUNTER — Encounter: Payer: Self-pay | Admitting: Gastroenterology

## 2019-11-14 MED FILL — OMEPRAZOLE 20 MG CAP: 20 | 90 days supply | Qty: 90 | Fill #2

## 2019-12-15 DIAGNOSIS — M25461 Effusion, right knee: Secondary | ICD-10-CM | POA: Diagnosis not present

## 2019-12-15 DIAGNOSIS — M25561 Pain in right knee: Secondary | ICD-10-CM | POA: Diagnosis not present

## 2019-12-16 ENCOUNTER — Other Ambulatory Visit: Payer: Self-pay | Admitting: Orthopedic Surgery

## 2019-12-16 DIAGNOSIS — M25461 Effusion, right knee: Secondary | ICD-10-CM

## 2019-12-16 DIAGNOSIS — M25561 Pain in right knee: Secondary | ICD-10-CM

## 2019-12-18 ENCOUNTER — Ambulatory Visit (INDEPENDENT_AMBULATORY_CARE_PROVIDER_SITE_OTHER): Payer: 59

## 2019-12-18 ENCOUNTER — Other Ambulatory Visit: Payer: Self-pay

## 2019-12-18 DIAGNOSIS — M25461 Effusion, right knee: Secondary | ICD-10-CM

## 2019-12-18 DIAGNOSIS — M25561 Pain in right knee: Secondary | ICD-10-CM

## 2019-12-23 DIAGNOSIS — M1711 Unilateral primary osteoarthritis, right knee: Secondary | ICD-10-CM | POA: Diagnosis not present

## 2019-12-23 MED FILL — traMADol HCL 50 MG TABS: 50 | 15 days supply | Qty: 60 | Fill #0

## 2019-12-28 ENCOUNTER — Other Ambulatory Visit: Payer: Self-pay | Admitting: Orthopedic Surgery

## 2020-01-04 DIAGNOSIS — H5203 Hypermetropia, bilateral: Secondary | ICD-10-CM | POA: Diagnosis not present

## 2020-01-04 DIAGNOSIS — H52223 Regular astigmatism, bilateral: Secondary | ICD-10-CM | POA: Diagnosis not present

## 2020-01-04 DIAGNOSIS — H524 Presbyopia: Secondary | ICD-10-CM | POA: Diagnosis not present

## 2020-01-04 DIAGNOSIS — H25813 Combined forms of age-related cataract, bilateral: Secondary | ICD-10-CM | POA: Diagnosis not present

## 2020-01-10 DIAGNOSIS — M1711 Unilateral primary osteoarthritis, right knee: Secondary | ICD-10-CM | POA: Diagnosis not present

## 2020-01-10 DIAGNOSIS — E782 Mixed hyperlipidemia: Secondary | ICD-10-CM | POA: Diagnosis not present

## 2020-01-10 DIAGNOSIS — Z01818 Encounter for other preprocedural examination: Secondary | ICD-10-CM | POA: Diagnosis not present

## 2020-01-10 NOTE — Patient Instructions (Addendum)
DUE TO COVID-19 ONLY ONE VISITOR IS ALLOWED TO COME WITH YOU AND STAY IN THE WAITING ROOM ONLY DURING PRE OP AND PROCEDURE DAY OF SURGERY. THE 1 VISITOR MAY VISIT WITH YOU AFTER SURGERY IN YOUR PRIVATE ROOM DURING VISITING HOURS ONLY!  YOU NEED TO HAVE A COVID 19 TEST ON: 01/12/20 @ 2:00 pm , THIS TEST MUST BE DONE BEFORE SURGERY, COME  [place], [place] , [address].  ([place]) ONCE YOUR COVID TEST IS COMPLETED, PLEASE BEGIN THE QUARANTINE INSTRUCTIONS AS OUTLINED IN YOUR HANDOUT.                Joanna Willis    Your procedure is scheduled on: 01/16/20   Report to [facility] Main  Entrance   Report to admitting at: 7:00 AM     Call this number if you have problems the morning of surgery [phone]    Remember:   NO SOLID FOOD AFTER MIDNIGHT THE NIGHT PRIOR TO SURGERY. NOTHING BY MOUTH EXCEPT CLEAR LIQUIDS UNTIL: 6:30 am . PLEASE FINISH ENSURE DRINK PER SURGEON ORDER  WHICH NEEDS TO BE COMPLETED AT: 6:30 am .   CLEAR LIQUID DIET   Foods Allowed                                                                     Foods Excluded  Coffee and tea, regular and decaf                             liquids that you cannot  Plain Jell-O any favor except red or purple                                           see through such as: Fruit ices (not with fruit pulp)                                     milk, soups, orange juice  Iced Popsicles                                    All solid food Carbonated beverages, regular and diet                                    Cranberry, grape and apple juices Sports drinks like Gatorade Lightly seasoned clear broth or consume(fat free) Sugar, honey syrup  Sample Menu Breakfast                                Lunch                                     Supper Cranberry juice  Beef broth                            Chicken broth Jell-O                                     Grape juice                            Apple juice Coffee or tea                        Jell-O                                      Popsicle                                                Coffee or tea                        Coffee or tea  _____________________________________________________________________   BRUSH YOUR TEETH MORNING OF SURGERY AND RINSE YOUR MOUTH OUT, NO CHEWING GUM CANDY OR MINTS.     Take these medicines the morning of surgery with A SIP OF WATER: omeprazole.Use Flonase as usual.                                You may not have any metal on your body including hair pins and              piercings  Do not wear jewelry, make-up, lotions, powders or perfumes, deodorant             Do not wear nail polish on your fingernails.  Do not shave  48 hours prior to surgery.             Do not bring valuables to the hospital. [place].  Contacts, dentures or bridgework may not be worn into surgery.  Leave suitcase in the car. After surgery it may be brought to your room.     Patients discharged the day of surgery will not be allowed to drive home. IF YOU ARE HAVING SURGERY AND GOING HOME THE SAME DAY, YOU MUST HAVE AN ADULT TO DRIVE YOU HOME AND BE WITH YOU FOR 24 HOURS. YOU MAY GO HOME BY TAXI OR UBER OR ORTHERWISE, BUT AN ADULT MUST ACCOMPANY YOU HOME AND STAY WITH YOU FOR 24 HOURS.  Name and phone number of your driver:  Special Instructions: N/A              Please read over the following fact sheets you were given: _____________________________________________________________________         [facility] - Preparing for Surgery Before surgery, you can play an important role.  Because skin is not sterile, your skin needs to be as free of germs as possible.  You can reduce the number of germs on your skin  by washing with CHG (chlorahexidine gluconate) soap before surgery.  CHG is an antiseptic cleaner which kills germs and bonds with the skin to continue  killing germs even after washing. Please DO NOT use if you have an allergy to CHG or antibacterial soaps.  If your skin becomes reddened/irritated stop using the CHG and inform your nurse when you arrive at Short Stay. Do not shave (including legs and underarms) for at least 48 hours prior to the first CHG shower.  You may shave your face/neck. Please follow these instructions carefully:  1.  Shower with CHG Soap the night before surgery and the  morning of Surgery.  2.  If you choose to wash your hair, wash your hair first as usual with your  normal  shampoo.  3.  After you shampoo, rinse your hair and body thoroughly to remove the  shampoo.                           4.  Use CHG as you would any other liquid soap.  You can apply chg directly  to the skin and wash                       Gently with a scrungie or clean washcloth.  5.  Apply the CHG Soap to your body ONLY FROM THE NECK DOWN.   Do not use on face/ open                           Wound or open sores. Avoid contact with eyes, ears mouth and genitals (private parts).                       Wash face,  Genitals (private parts) with your normal soap.             6.  Wash thoroughly, paying special attention to the area where your surgery  will be performed.  7.  Thoroughly rinse your body with warm water from the neck down.  8.  DO NOT shower/wash with your normal soap after using and rinsing off  the CHG Soap.                9.  Pat yourself dry with a clean towel.            10.  Wear clean pajamas.            11.  Place clean sheets on your bed the night of your first shower and do not  sleep with pets. Day of Surgery : Do not apply any lotions/deodorants the morning of surgery.  Please wear clean clothes to the hospital/surgery center.  FAILURE TO FOLLOW THESE INSTRUCTIONS MAY RESULT IN THE CANCELLATION OF YOUR SURGERY PATIENT SIGNATURE_________________________________  NURSE  SIGNATURE__________________________________  ________________________________________________________________________   Joanna Willis  An incentive spirometer is a tool that can help keep your lungs clear and active. This tool measures how well you are filling your lungs with each breath. Taking long deep breaths may help reverse or decrease the chance of developing breathing (pulmonary) problems (especially infection) following:  A long period of time when you are unable to move or be active. BEFORE THE PROCEDURE   If the spirometer includes an indicator to show your best effort, your nurse or respiratory therapist will set it to a desired goal.  If possible, sit  up straight or lean slightly forward. Try not to slouch.  Hold the incentive spirometer in an upright position. INSTRUCTIONS FOR USE  1. Sit on the edge of your bed if possible, or sit up as far as you can in bed or on a chair. 2. Hold the incentive spirometer in an upright position. 3. Breathe out normally. 4. Place the mouthpiece in your mouth and seal your lips tightly around it. 5. Breathe in slowly and as deeply as possible, raising the piston or the ball toward the top of the column. 6. Hold your breath for 3-5 seconds or for as long as possible. Allow the piston or ball to fall to the bottom of the column. 7. Remove the mouthpiece from your mouth and breathe out normally. 8. Rest for a few seconds and repeat Steps 1 through 7 at least 10 times every 1-2 hours when you are awake. Take your time and take a few normal breaths between deep breaths. 9. The spirometer [place] include an indicator to show your best effort. Use the indicator as a goal to work toward during each repetition. 10. After each set of 10 deep breaths, practice coughing to be sure your lungs are clear. If you have an incision (the cut made at the time of surgery), support your incision when coughing by placing a pillow or rolled up towels firmly  against it. Once you are able to get out of bed, walk around indoors and cough well. You Gallery stop using the incentive spirometer when instructed by your caregiver.  RISKS AND COMPLICATIONS  Take your time so you do not get dizzy or light-headed.  If you are in pain, you Rogan need to take or ask for pain medication before doing incentive spirometry. It is harder to take a deep breath if you are having pain. AFTER USE  Rest and breathe slowly and easily.  It can be helpful to keep track of a log of your progress. Your caregiver can provide you with a simple table to help with this. If you are using the spirometer at home, follow these instructions: [place] IF:   You are having difficultly using the spirometer.  You have trouble using the spirometer as often as instructed.  Your pain medication is not giving enough relief while using the spirometer.  You develop fever of 100.5 F (38.1 C) or higher. SEEK IMMEDIATE MEDICAL CARE IF:   You cough up bloody sputum that had not been present before.  You develop fever of 102 F (38.9 C) or greater.  You develop worsening pain at or near the incision site. MAKE SURE YOU:   Understand these instructions.  Will watch your condition.  Will get help right away if you are not doing well or get worse. Document Released: 10/20/2006 Document Revised: 09/01/2011 Document Reviewed: 12/21/2006 [facility] Patient Information 2014 [place], [place].   ________________________________________________________________________

## 2020-01-11 ENCOUNTER — Encounter (HOSPITAL_COMMUNITY)
Admission: RE | Admit: 2020-01-11 | Discharge: 2020-01-11 | Disposition: A | Discharge status: Home / Self Care | Payer: 59 | Source: Ambulatory Visit | Attending: Orthopedic Surgery | Admitting: Orthopedic Surgery

## 2020-01-11 ENCOUNTER — Other Ambulatory Visit: Payer: Self-pay

## 2020-01-11 ENCOUNTER — Encounter (HOSPITAL_COMMUNITY): Payer: Self-pay

## 2020-01-11 ENCOUNTER — Ambulatory Visit (HOSPITAL_COMMUNITY)
Admission: RE | Admit: 2020-01-11 | Discharge: 2020-01-11 | Disposition: A | Discharge status: Home / Self Care | Payer: 59 | Source: Ambulatory Visit | Attending: Orthopedic Surgery | Admitting: Orthopedic Surgery

## 2020-01-11 DIAGNOSIS — Z01810 Encounter for preprocedural cardiovascular examination: Secondary | ICD-10-CM | POA: Diagnosis not present

## 2020-01-11 DIAGNOSIS — M1711 Unilateral primary osteoarthritis, right knee: Secondary | ICD-10-CM | POA: Diagnosis not present

## 2020-01-11 DIAGNOSIS — E782 Mixed hyperlipidemia: Secondary | ICD-10-CM | POA: Diagnosis not present

## 2020-01-11 DIAGNOSIS — Z01818 Encounter for other preprocedural examination: Secondary | ICD-10-CM

## 2020-01-11 DIAGNOSIS — Z8679 Personal history of other diseases of the circulatory system: Secondary | ICD-10-CM | POA: Diagnosis not present

## 2020-01-11 LAB — APTT: aPTT: 28 seconds (ref 24–36)

## 2020-01-11 LAB — CBC WITH DIFFERENTIAL/PLATELET
Abs Immature Granulocytes: 0.01 10*3/uL (ref 0.00–0.07)
Basophils Absolute: 0 10*3/uL (ref 0.0–0.1)
Basophils Relative: 1 %
Eosinophils Absolute: 0.1 10*3/uL (ref 0.0–0.5)
Eosinophils Relative: 2 %
HCT: 43.4 % (ref 36.0–46.0)
Hemoglobin: 13.9 g/dL (ref 12.0–15.0)
Immature Granulocytes: 0 %
Lymphocytes Relative: 28 %
Lymphs Abs: 1.8 10*3/uL (ref 0.7–4.0)
MCH: 31.1 pg (ref 26.0–34.0)
MCHC: 32 g/dL (ref 30.0–36.0)
MCV: 97.1 fL (ref 80.0–100.0)
Monocytes Absolute: 0.6 10*3/uL (ref 0.1–1.0)
Monocytes Relative: 9 %
Neutro Abs: 3.9 10*3/uL (ref 1.7–7.7)
Neutrophils Relative %: 60 %
Platelets: 267 10*3/uL (ref 150–400)
RBC: 4.47 MIL/uL (ref 3.87–5.11)
RDW: 12.4 % (ref 11.5–15.5)
WBC: 6.4 10*3/uL (ref 4.0–10.5)
nRBC: 0 % (ref 0.0–0.2)

## 2020-01-11 LAB — BASIC METABOLIC PANEL
Anion gap: 8 (ref 5–15)
BUN: 17 mg/dL (ref 8–23)
CO2: 31 mmol/L (ref 22–32)
Calcium: 9.2 mg/dL (ref 8.9–10.3)
Chloride: 100 mmol/L (ref 98–111)
Creatinine, Ser: 0.68 mg/dL (ref 0.44–1.00)
GFR calc Af Amer: >60 mL/min (ref >60)
GFR calc non Af Amer: >60 mL/min (ref >60)
Glucose, Bld: 99 mg/dL (ref 70–99)
Potassium: 4.1 mmol/L (ref 3.5–5.1)
Sodium: 139 mmol/L (ref 135–145)

## 2020-01-11 LAB — URINALYSIS, ROUTINE W REFLEX MICROSCOPIC
Bilirubin Urine: NEGATIVE
Glucose, UA: NEGATIVE mg/dL
Hgb urine dipstick: NEGATIVE
Ketones, ur: NEGATIVE mg/dL
Leukocytes,Ua: NEGATIVE
Nitrite: NEGATIVE
Protein, ur: NEGATIVE mg/dL
Specific Gravity, Urine: 1.016 (ref 1.005–1.030)
pH: 5 (ref 5.0–8.0)

## 2020-01-11 LAB — PROTIME-INR
INR: 0.9 (ref 0.8–1.2)
Prothrombin Time: 12.2 seconds (ref 11.4–15.2)

## 2020-01-11 LAB — SURGICAL PCR SCREEN
MRSA, PCR: NEGATIVE
Staphylococcus aureus: NEGATIVE

## 2020-01-11 NOTE — Progress Notes (Addendum)
COVID Vaccine Completed:yes Date COVID Vaccine completed:7/ COVID vaccine manufacturer: [place]   PCP - Dr. Doug Sou. LOV: 01/10/20 Cardiologist -   Chest x-ray -  EKG - 01/10/20. Chart Stress Test -  ECHO -  Cardiac Cath -   Sleep Study -  CPAP -   Fasting Blood Sugar -  Checks Blood Sugar _____ times a day  Blood Thinner Instructions: Aspirin Instructions: Last Dose:  Anesthesia review:   Patient denies shortness of breath, fever, cough and chest pain at PAT appointment   Patient verbalized understanding of instructions that were given to them at the PAT appointment. Patient was also instructed that they will need to review over the PAT instructions again at home before surgery.

## 2020-01-12 ENCOUNTER — Other Ambulatory Visit (HOSPITAL_COMMUNITY)
Admission: RE | Admit: 2020-01-12 | Discharge: 2020-01-12 | Disposition: A | Discharge status: Home / Self Care | Payer: 59 | Source: Ambulatory Visit | Attending: Orthopedic Surgery | Admitting: Orthopedic Surgery

## 2020-01-12 DIAGNOSIS — Z20822 Contact with and (suspected) exposure to covid-19: Secondary | ICD-10-CM | POA: Diagnosis not present

## 2020-01-12 DIAGNOSIS — Z01812 Encounter for preprocedural laboratory examination: Secondary | ICD-10-CM | POA: Diagnosis not present

## 2020-01-12 LAB — SARS CORONAVIRUS 2 (TAT 6-24 HRS): SARS Coronavirus 2: NEGATIVE

## 2020-01-13 DIAGNOSIS — M1711 Unilateral primary osteoarthritis, right knee: Secondary | ICD-10-CM | POA: Diagnosis present

## 2020-01-13 NOTE — Anesthesia Preprocedure Evaluation (Addendum)
Anesthesia Evaluation  Patient identified by MRN, date of birth, ID band Patient awake    Reviewed: Allergy & Precautions, NPO status , Patient's Chart, lab work & pertinent test results  History of Anesthesia Complications (+) PONV and history of anesthetic complications  Airway Mallampati: II  TM Distance: >3 FB Neck ROM: Full    Dental no notable dental hx.    Pulmonary neg pulmonary ROS,    Pulmonary exam normal        Cardiovascular negative cardio ROS Normal cardiovascular exam     Neuro/Psych Depression negative neurological ROS     GI/Hepatic Neg liver ROS, GERD  Medicated and Controlled,  Endo/Other  negative endocrine ROS  Renal/GU negative Renal ROS  negative genitourinary   Musculoskeletal  (+) Arthritis ,   Abdominal   Peds  Hematology negative hematology ROS (+)   Anesthesia Other Findings Day of surgery medications reviewed with patient.  Reproductive/Obstetrics negative OB ROS                            Anesthesia Physical Anesthesia Plan  ASA: II  Anesthesia Plan: Spinal   Post-op Pain Management:  Regional for Post-op pain   Induction:   PONV Risk Score and Plan: 4 or greater and Treatment may vary due to age or medical condition, Ondansetron, Propofol infusion, Dexamethasone and Midazolam  Airway Management Planned: Natural Airway and Simple Face Mask  Additional Equipment: None  Intra-op Plan:   Post-operative Plan:   Informed Consent: I have reviewed the patients History and Physical, chart, labs and discussed the procedure including the risks, benefits and alternatives for the proposed anesthesia with the patient or authorized representative who has indicated his/her understanding and acceptance.     Dental advisory given  Plan Discussed with: CRNA  Anesthesia Plan Comments:        Anesthesia Quick Evaluation

## 2020-01-13 NOTE — H&P (Signed)
TOTAL KNEE ADMISSION H&P  Patient is being admitted for right total knee arthroplasty.  Subjective:  Chief Complaint:right knee pain.  HPI: Joanna Willis, 64 y.o. female, has a history of pain and functional disability in the right knee due to arthritis and has failed non-surgical conservative treatments for greater than 12 weeks to includeNSAID's and/or analgesics, corticosteriod injections, weight reduction as appropriate and activity modification.  Onset of symptoms was gradual, starting several years ago with gradually worsening course since that time. The patient noted prior procedures on the knee to include  arthroscopy on the right knee(s).  Patient currently rates pain in the right knee(s) at 10 out of 10 with activity. Patient has night pain, worsening of pain with activity and weight bearing, pain that interferes with activities of daily living, pain with passive range of motion and joint swelling.  Patient has evidence of joint space narrowing by imaging studies.   There is no active infection.  Patient Active Problem List   Diagnosis Date Noted  . Osteoarthritis of right knee 01/13/2020  . S/P left unicompartmental knee replacement 01/13/2018  . Arthrofibrosis of total knee arthroplasty, subsequent encounter 01/21/2017  . Left knee pain 04/19/2014  . BRONCHITIS, ACUTE 07/21/2010   Past Medical History:  Diagnosis Date  . Allergy   . Arthritis   . Depression   . GERD (gastroesophageal reflux disease)   . Hypercholesteremia   . PONV (postoperative nausea and vomiting)   . Postmenopausal   . Wears glasses     Past Surgical History:  Procedure Laterality Date  . ABDOMINAL HYSTERECTOMY  2005   did not remove ovaries  . CESAREAN SECTION  2000  . COLONOSCOPY    . CONVERSION TO TOTAL KNEE Left 01/13/2018   Procedure: Revision of Left knee unicompartmental arthroplasty to total knee arthroplasty;  Surgeon: [name], MD;  Location: [facility];  Service: Orthopedics;   Laterality: Left;  . FOOT ARTHRODESIS  1995   right  . KNEE ARTHROSCOPY WITH MEDIAL MENISECTOMY Left 05/17/2014   Procedure: LEFT ARTHROSCOPY KNEE WITH PARTIAL MEDIAL MENISECTOMY, CHONDROPLASTY OF LATERAL FEMORAL CONDYLE;  Surgeon: Alta Corning, MD;  Location: [facility];  Service: Orthopedics;  Laterality: Left;  . KNEE CLOSED REDUCTION Left 07/21/2018   Procedure: CLOSED MANIPULATION KNEE;  Surgeon: [name], MD;  Location: [facility];  Service: Orthopedics;  Laterality: Left;  60min  . PARTIAL KNEE ARTHROPLASTY Left 01/21/2017   Procedure: UNICOMPARTMENTAL LEFT KNEE;  Surgeon: [name], MD;  Location: [facility];  Service: Orthopedics;  Laterality: Left;  . Revision left knee     Dr. Wynelle Link 01-13-18    No current facility-administered medications for this encounter.   Current Outpatient Medications  Medication Sig Dispense Refill Last Dose  . acetaminophen (TYLENOL) 500 MG tablet Take 1,000 mg by mouth every 6 (six) hours as needed for moderate pain or headache.      Marland Kitchen atorvastatin (LIPITOR) 20 MG tablet Take 20 mg by mouth at bedtime.      Marland Kitchen estradiol (VIVELLE-DOT) 0.025 MG/24HR Place 1 patch onto the skin 2 (two) times a week. Mondays & Thursdays     . fluticasone (FLONASE) 50 MCG/ACT nasal spray Place 1 spray into both nostrils daily as needed for allergies or rhinitis.     . meloxicam (MOBIC) 15 MG tablet Take 15 mg by mouth at bedtime.      Marland Kitchen omeprazole (PRILOSEC) 20 MG capsule Take 20 mg by mouth daily before breakfast.      .  traMADol (ULTRAM) 50 MG tablet Take 50 mg by mouth every 6 (six) hours as needed (pain.).      Marland Kitchen rOPINIRole (REQUIP) 0.25 MG tablet Take 0.25 mg by mouth at bedtime as needed (restless leg).       Allergies  Allergen Reactions  . Dust Mite Mixed Allergen Ext  [Mite (D. Farinae)] Swelling  . Penicillins Rash and Other (See Comments)    As a child. Has patient had a PCN reaction causing immediate rash, facial/tongue/throat swelling, SOB or  lightheadedness with hypotension: Unknown Has patient had a PCN reaction causing severe rash involving mucus membranes or skin necrosis: Unknown Has patient had a PCN reaction that required hospitalization: Unknown Has patient had a PCN reaction occurring within the last 10 years: Unknown If all of the above answers are "NO", then may proceed with Cephalosporin use.     Social History   Tobacco Use  . Smoking status: Never Smoker  . Smokeless tobacco: Never Used  Substance Use Topics  . Alcohol use: Yes    Alcohol/week: 5.0 standard drinks    Types: 5 Cans of beer per week    Family History  Problem Relation Age of Onset  . Colon cancer Mother 72       lived 6 months after diagnosis  . Colon polyps Sister   . Diverticulosis Sister   . Diverticulitis Sister   . Pancreatic cancer Neg Hx   . Stomach cancer Neg Hx   . Esophageal cancer Neg Hx   . Rectal cancer Neg Hx      Review of Systems  Constitutional: Negative.   Eyes: Negative.   Respiratory: Negative.   Cardiovascular: Negative.   Gastrointestinal: Negative.   Endocrine: Negative.   Genitourinary: Negative.   Musculoskeletal: Positive for arthralgias.  Skin: Negative.   Allergic/Immunologic: Negative.   Neurological: Negative.   Hematological: Negative.   Psychiatric/Behavioral: Negative.     Objective:  Physical Exam Constitutional:      Appearance: Normal appearance. She is normal weight.  HENT:     Head: Normocephalic and atraumatic.     Nose: Nose normal.     Mouth/Throat:     Mouth: Mucous membranes are moist.     Pharynx: Oropharynx is clear.  Eyes:     Pupils: Pupils are equal, round, and reactive to light.  Pulmonary:     Effort: Pulmonary effort is normal.  Musculoskeletal:        General: Tenderness present.     Cervical back: Normal range of motion and neck supple.     Comments: Tender along the medial side of the left knee 1-2+ effusion varus stress exacerbates her pain valgus does not.     Skin:    General: Skin is warm and dry.  Neurological:     General: No focal deficit present.     Mental Status: She is alert and oriented to person, place, and time. Mental status is at baseline.  Psychiatric:        Mood and Affect: Mood normal.        Behavior: Behavior normal.        Thought Content: Thought content normal.        Judgment: Judgment normal.     Vital signs in last 24 hours:    Labs:   Estimated body mass index is 24.37 kg/m as calculated from the following:   Height as of 01/11/20: 5\' 6"  (1.676 m).   Weight as of 01/11/20: 68.5 kg.  Imaging Review Plain radiographs demonstrate  bilateral AP weightbearing, bilateral Rosenberg, lateral sunrise views of the right knee are taken and reviewed in office today.  Patient does have moderate arthritis of the medial compartment right knee with loss of a proximally half of her articular cartilage medial joint line.  An MRI scan has been accomplished showing tearing extrusion of the medial meniscus and more importantly significant loss of articular cartilage to the medial femoral condyle bare bone in areas and subchondral edema throughout the medial femoral condyle.    Assessment/Plan:  End stage arthritis, right knee   The patient history, physical examination, clinical judgment of the provider and imaging studies are consistent with end stage degenerative joint disease of the right knee(s) and total knee arthroplasty is deemed medically necessary. The treatment options including medical management, injection therapy arthroscopy and arthroplasty were discussed at length. The risks and benefits of total knee arthroplasty were presented and reviewed. The risks due to aseptic loosening, infection, stiffness, patella tracking problems, thromboembolic complications and other imponderables were discussed. The patient acknowledged the explanation, agreed to proceed with the plan and consent was signed. Patient is being admitted  for inpatient treatment for surgery, pain control, PT, OT, prophylactic antibiotics, VTE prophylaxis, progressive ambulation and ADL's and discharge planning. The patient is planning to be discharged home with home health services     Patient's anticipated LOS is less than 2 midnights, meeting these requirements: - Younger than 29 - Lives within 1 hour of care - Has a competent adult at home to recover with post-op recover - NO history of  - Chronic pain requiring opiods  - Diabetes  - Coronary Artery Disease  - Heart failure  - Heart attack  - Stroke  - DVT/VTE  - Cardiac arrhythmia  - Respiratory Failure/COPD  - Renal failure  - Anemia  - Advanced Liver disease

## 2020-01-13 NOTE — Progress Notes (Signed)
Called and informed patient of time change for surgery for 01/16/20. Patient to arrive 0515 for 0715 surgery. To complete ensure drink by 0415 that morning. She verbalizes understanding.

## 2020-01-15 MED ORDER — TRANEXAMIC ACID 1000 MG/10ML IV SOLN
2000.0000 mg | INTRAVENOUS | Status: DC
  Filled 2020-01-15: qty 20

## 2020-01-15 MED ORDER — BUPIVACAINE LIPOSOME 1.3 % IJ SUSP
20.0000 mL | Freq: Once | INTRAMUSCULAR | Status: DC
  Filled 2020-01-15: qty 20

## 2020-01-16 ENCOUNTER — Other Ambulatory Visit: Payer: Self-pay

## 2020-01-16 ENCOUNTER — Ambulatory Visit (HOSPITAL_COMMUNITY): Payer: 59 | Admitting: Anesthesiology

## 2020-01-16 ENCOUNTER — Encounter (HOSPITAL_COMMUNITY)
Admission: RE | Disposition: A | Discharge status: Home / Self Care | Payer: Self-pay | Source: Home / Self Care | Attending: Orthopedic Surgery

## 2020-01-16 ENCOUNTER — Encounter (HOSPITAL_COMMUNITY): Payer: Self-pay | Admitting: Orthopedic Surgery

## 2020-01-16 ENCOUNTER — Observation Stay (HOSPITAL_COMMUNITY)
Admission: RE | Admit: 2020-01-16 | Discharge: 2020-01-17 | Disposition: A | Discharge status: Home / Self Care | Payer: 59 | Attending: Orthopedic Surgery | Admitting: Orthopedic Surgery

## 2020-01-16 DIAGNOSIS — M25561 Pain in right knee: Secondary | ICD-10-CM | POA: Diagnosis present

## 2020-01-16 DIAGNOSIS — G8918 Other acute postprocedural pain: Secondary | ICD-10-CM | POA: Diagnosis not present

## 2020-01-16 DIAGNOSIS — Z96651 Presence of right artificial knee joint: Secondary | ICD-10-CM

## 2020-01-16 DIAGNOSIS — E7800 Pure hypercholesterolemia, unspecified: Secondary | ICD-10-CM | POA: Diagnosis not present

## 2020-01-16 DIAGNOSIS — M1711 Unilateral primary osteoarthritis, right knee: Secondary | ICD-10-CM | POA: Diagnosis not present

## 2020-01-16 DIAGNOSIS — M24661 Ankylosis, right knee: Secondary | ICD-10-CM | POA: Diagnosis not present

## 2020-01-16 HISTORY — PX: TOTAL KNEE ARTHROPLASTY: SHX125

## 2020-01-16 LAB — TYPE AND SCREEN
ABO/RH(D): O NEG
Antibody Screen: NEGATIVE

## 2020-01-16 SURGERY — ARTHROPLASTY, KNEE, TOTAL
Anesthesia: Spinal | Site: Knee | Laterality: Right

## 2020-01-16 MED ORDER — TRANEXAMIC ACID-NACL 1000-0.7 MG/100ML-% IV SOLN
1000.0000 mg | INTRAVENOUS | Status: AC
  Administered 2020-01-16: 1000 mg via INTRAVENOUS
  Filled 2020-01-16: qty 100

## 2020-01-16 MED ORDER — ONDANSETRON HCL 4 MG/2ML IJ SOLN: 4.0000 mg | Freq: Four times a day (QID) | INTRAMUSCULAR | Status: DC | PRN

## 2020-01-16 MED ORDER — POLYETHYLENE GLYCOL 3350 17 G PO PACK: 17.0000 g | PACK | Freq: Every day | ORAL | Status: DC | PRN

## 2020-01-16 MED ORDER — SODIUM CHLORIDE (PF) 0.9 % IJ SOLN
INTRAMUSCULAR | Status: AC
  Filled 2020-01-16: qty 20

## 2020-01-16 MED ORDER — BUPIVACAINE-EPINEPHRINE (PF) 0.5% -1:200000 IJ SOLN
INTRAMUSCULAR | Status: DC | PRN
Start: 2020-01-16 — End: 2020-01-16
  Administered 2020-01-16: 15 mL via PERINEURAL

## 2020-01-16 MED ORDER — HYDROMORPHONE HCL 1 MG/ML IJ SOLN: 0.5000 mg | INTRAMUSCULAR | Status: DC | PRN

## 2020-01-16 MED ORDER — METHOCARBAMOL 500 MG PO TABS
500.0000 mg | ORAL_TABLET | Freq: Four times a day (QID) | ORAL | Status: DC | PRN
  Administered 2020-01-16 – 2020-01-17 (×3): 500 mg via ORAL
  Filled 2020-01-16 (×3): qty 1

## 2020-01-16 MED ORDER — TIZANIDINE HCL 2 MG PO TABS
2.0000 mg | ORAL_TABLET | Freq: Four times a day (QID) | ORAL | 0 refills | Status: DC | PRN
Start: 2020-01-16 — End: 2020-04-20

## 2020-01-16 MED ORDER — FENTANYL CITRATE (PF) 100 MCG/2ML IJ SOLN
INTRAMUSCULAR | Status: DC | PRN
  Administered 2020-01-16 (×2): 50 ug via INTRAVENOUS

## 2020-01-16 MED ORDER — ONDANSETRON HCL 4 MG PO TABS
4.0000 mg | ORAL_TABLET | Freq: Four times a day (QID) | ORAL | Status: DC | PRN
  Administered 2020-01-17: 4 mg via ORAL
  Filled 2020-01-16: qty 1

## 2020-01-16 MED ORDER — ASPIRIN EC 81 MG PO TBEC
81.0000 mg | DELAYED_RELEASE_TABLET | Freq: Two times a day (BID) | ORAL | 0 refills | Status: DC
Start: 2020-01-16 — End: 2020-04-20

## 2020-01-16 MED ORDER — TRANEXAMIC ACID-NACL 1000-0.7 MG/100ML-% IV SOLN
1000.0000 mg | Freq: Once | INTRAVENOUS | Status: AC
  Administered 2020-01-16: 1000 mg via INTRAVENOUS
  Filled 2020-01-16: qty 100

## 2020-01-16 MED ORDER — METOCLOPRAMIDE HCL 5 MG PO TABS: 5.0000 mg | ORAL_TABLET | Freq: Three times a day (TID) | ORAL | Status: DC | PRN

## 2020-01-16 MED ORDER — SODIUM CHLORIDE (PF) 0.9 % IJ SOLN
INTRAMUSCULAR | Status: DC | PRN
  Administered 2020-01-16: 70 mL

## 2020-01-16 MED ORDER — MIDAZOLAM HCL 2 MG/2ML IJ SOLN
INTRAMUSCULAR | Status: DC | PRN
  Administered 2020-01-16: 2 mg via INTRAVENOUS

## 2020-01-16 MED ORDER — ROPINIROLE HCL 0.5 MG PO TABS: 0.2500 mg | ORAL_TABLET | Freq: Every evening | ORAL | Status: DC | PRN

## 2020-01-16 MED ORDER — BUPIVACAINE-EPINEPHRINE (PF) 0.25% -1:200000 IJ SOLN
INTRAMUSCULAR | Status: DC | PRN
  Administered 2020-01-16: 30 mL

## 2020-01-16 MED ORDER — LACTATED RINGERS IV SOLN: INTRAVENOUS | Status: DC

## 2020-01-16 MED ORDER — OXYCODONE HCL 5 MG PO TABS: 5.0000 mg | ORAL_TABLET | Freq: Once | ORAL | Status: DC | PRN

## 2020-01-16 MED ORDER — PROPOFOL 10 MG/ML IV BOLUS
INTRAVENOUS | Status: DC | PRN
  Administered 2020-01-16: 30 mg via INTRAVENOUS
  Administered 2020-01-16: 20 mg via INTRAVENOUS

## 2020-01-16 MED ORDER — ATORVASTATIN CALCIUM 20 MG PO TABS
20.0000 mg | ORAL_TABLET | Freq: Every day | ORAL | Status: DC
  Administered 2020-01-16: 20 mg via ORAL
  Filled 2020-01-16: qty 1

## 2020-01-16 MED ORDER — STERILE WATER FOR INJECTION IJ SOLN
INTRAMUSCULAR | Status: AC
  Filled 2020-01-16: qty 70

## 2020-01-16 MED ORDER — SODIUM CHLORIDE 0.9 % IR SOLN
Status: DC | PRN
  Administered 2020-01-16: 2000 mL

## 2020-01-16 MED ORDER — KCL IN DEXTROSE-NACL 20-5-0.45 MEQ/L-%-% IV SOLN
INTRAVENOUS | Status: DC
  Filled 2020-01-16 (×3): qty 1000

## 2020-01-16 MED ORDER — ASPIRIN 81 MG PO CHEW
81.0000 mg | CHEWABLE_TABLET | Freq: Two times a day (BID) | ORAL | Status: DC
  Administered 2020-01-16 – 2020-01-17 (×2): 81 mg via ORAL
  Filled 2020-01-16 (×2): qty 1

## 2020-01-16 MED ORDER — CHLORHEXIDINE GLUCONATE 0.12 % MT SOLN
15.0000 mL | Freq: Once | OROMUCOSAL | Status: AC
  Administered 2020-01-16: 15 mL via OROMUCOSAL

## 2020-01-16 MED ORDER — OXYCODONE HCL 5 MG PO TABS
5.0000 mg | ORAL_TABLET | ORAL | Status: DC | PRN
  Administered 2020-01-16: 5 mg via ORAL
  Administered 2020-01-16: 10 mg via ORAL
  Administered 2020-01-16: 5 mg via ORAL
  Administered 2020-01-17 (×2): 10 mg via ORAL
  Filled 2020-01-16 (×5): qty 2
  Filled 2020-01-16: qty 1

## 2020-01-16 MED ORDER — MIDAZOLAM HCL 2 MG/2ML IJ SOLN
INTRAMUSCULAR | Status: AC
  Filled 2020-01-16: qty 2

## 2020-01-16 MED ORDER — OXYCODONE HCL 5 MG/5ML PO SOLN: 5.0000 mg | Freq: Once | ORAL | Status: DC | PRN

## 2020-01-16 MED ORDER — PROPOFOL 10 MG/ML IV BOLUS
INTRAVENOUS | Status: AC
  Filled 2020-01-16: qty 20

## 2020-01-16 MED ORDER — DIPHENHYDRAMINE HCL 12.5 MG/5ML PO ELIX: 12.5000 mg | ORAL_SOLUTION | ORAL | Status: DC | PRN

## 2020-01-16 MED ORDER — SODIUM CHLORIDE (PF) 0.9 % IJ SOLN
INTRAMUSCULAR | Status: AC
  Filled 2020-01-16: qty 50

## 2020-01-16 MED ORDER — ACETAMINOPHEN 325 MG PO TABS
325.0000 mg | ORAL_TABLET | Freq: Four times a day (QID) | ORAL | Status: DC | PRN
  Administered 2020-01-16 – 2020-01-17 (×2): 650 mg via ORAL
  Filled 2020-01-16 (×2): qty 2

## 2020-01-16 MED ORDER — SODIUM CHLORIDE 0.45 % IV BOLUS: 500.0000 mL | INTRAVENOUS | Status: DC

## 2020-01-16 MED ORDER — BUPIVACAINE LIPOSOME 1.3 % IJ SUSP
INTRAMUSCULAR | Status: DC | PRN
  Administered 2020-01-16: 20 mL

## 2020-01-16 MED ORDER — ALUM & MAG HYDROXIDE-SIMETH 200-200-20 MG/5ML PO SUSP: 30.0000 mL | ORAL | Status: DC | PRN

## 2020-01-16 MED ORDER — ONDANSETRON HCL 4 MG/2ML IJ SOLN
INTRAMUSCULAR | Status: DC | PRN
  Administered 2020-01-16: 4 mg via INTRAVENOUS

## 2020-01-16 MED ORDER — PROMETHAZINE HCL 25 MG/ML IJ SOLN: 6.2500 mg | INTRAMUSCULAR | Status: DC | PRN

## 2020-01-16 MED ORDER — WATER FOR IRRIGATION, STERILE IR SOLN
Status: DC | PRN
  Administered 2020-01-16: 2000 mL

## 2020-01-16 MED ORDER — PANTOPRAZOLE SODIUM 40 MG PO TBEC
40.0000 mg | DELAYED_RELEASE_TABLET | Freq: Every day | ORAL | Status: DC
  Administered 2020-01-17: 40 mg via ORAL
  Filled 2020-01-16: qty 1

## 2020-01-16 MED ORDER — FLUTICASONE PROPIONATE 50 MCG/ACT NA SUSP: 1.0000 | Freq: Every day | NASAL | Status: DC | PRN

## 2020-01-16 MED ORDER — DEXAMETHASONE SODIUM PHOSPHATE 10 MG/ML IJ SOLN
INTRAMUSCULAR | Status: DC | PRN
  Administered 2020-01-16: 10 mg via INTRAVENOUS

## 2020-01-16 MED ORDER — TRAMADOL HCL 50 MG PO TABS: 50.0000 mg | ORAL_TABLET | Freq: Four times a day (QID) | ORAL | Status: DC | PRN

## 2020-01-16 MED ORDER — PROPOFOL 500 MG/50ML IV EMUL
INTRAVENOUS | Status: DC | PRN
  Administered 2020-01-16: 125 ug/kg/min via INTRAVENOUS

## 2020-01-16 MED ORDER — PHENYLEPHRINE HCL-NACL 10-0.9 MG/250ML-% IV SOLN
INTRAVENOUS | Status: DC | PRN
Start: 2020-01-16 — End: 2020-01-16
  Administered 2020-01-16: 25 ug/min via INTRAVENOUS

## 2020-01-16 MED ORDER — TRANEXAMIC ACID 1000 MG/10ML IV SOLN
INTRAVENOUS | Status: DC | PRN
  Administered 2020-01-16: 2000 mg via TOPICAL

## 2020-01-16 MED ORDER — ORAL CARE MOUTH RINSE: 15.0000 mL | Freq: Once | OROMUCOSAL | Status: AC

## 2020-01-16 MED ORDER — METOCLOPRAMIDE HCL 5 MG/ML IJ SOLN: 5.0000 mg | Freq: Three times a day (TID) | INTRAMUSCULAR | Status: DC | PRN

## 2020-01-16 MED ORDER — OXYCODONE-ACETAMINOPHEN 5-325 MG PO TABS: 1.0000 | ORAL_TABLET | ORAL | 0 refills | Status: DC | PRN

## 2020-01-16 MED ORDER — PHENYLEPHRINE HCL (PRESSORS) 10 MG/ML IV SOLN
INTRAVENOUS | Status: AC
  Filled 2020-01-16: qty 1

## 2020-01-16 MED ORDER — BUPIVACAINE-EPINEPHRINE (PF) 0.25% -1:200000 IJ SOLN
INTRAMUSCULAR | Status: AC
  Filled 2020-01-16: qty 30

## 2020-01-16 MED ORDER — FENTANYL CITRATE (PF) 100 MCG/2ML IJ SOLN: 25.0000 ug | INTRAMUSCULAR | Status: DC | PRN

## 2020-01-16 MED ORDER — DOCUSATE SODIUM 100 MG PO CAPS
100.0000 mg | ORAL_CAPSULE | Freq: Two times a day (BID) | ORAL | Status: DC
  Administered 2020-01-16 – 2020-01-17 (×2): 100 mg via ORAL
  Filled 2020-01-16 (×2): qty 1

## 2020-01-16 MED ORDER — BUPIVACAINE IN DEXTROSE 0.75-8.25 % IT SOLN
INTRATHECAL | Status: DC | PRN
  Administered 2020-01-16: 1.6 mL via INTRATHECAL

## 2020-01-16 MED ORDER — FENTANYL CITRATE (PF) 100 MCG/2ML IJ SOLN
INTRAMUSCULAR | Status: AC
  Filled 2020-01-16: qty 2

## 2020-01-16 MED ORDER — GABAPENTIN 100 MG PO CAPS
100.0000 mg | ORAL_CAPSULE | Freq: Three times a day (TID) | ORAL | Status: DC
  Administered 2020-01-16 – 2020-01-17 (×4): 100 mg via ORAL
  Filled 2020-01-16 (×4): qty 1

## 2020-01-16 MED ORDER — PROPOFOL 1000 MG/100ML IV EMUL
INTRAVENOUS | Status: AC
  Filled 2020-01-16: qty 100

## 2020-01-16 MED ORDER — POVIDONE-IODINE 10 % EX SWAB
2.0000 "application " | Freq: Once | CUTANEOUS | Status: AC
  Administered 2020-01-16: 2 via TOPICAL

## 2020-01-16 MED ORDER — FLEET ENEMA 7-19 GM/118ML RE ENEM: 1.0000 | ENEMA | Freq: Once | RECTAL | Status: DC | PRN

## 2020-01-16 MED ORDER — ACETAMINOPHEN 500 MG PO TABS
1000.0000 mg | ORAL_TABLET | Freq: Once | ORAL | Status: AC
  Administered 2020-01-16: 1000 mg via ORAL
  Filled 2020-01-16: qty 2

## 2020-01-16 MED ORDER — CELECOXIB 200 MG PO CAPS
200.0000 mg | ORAL_CAPSULE | Freq: Two times a day (BID) | ORAL | Status: DC
  Administered 2020-01-16 – 2020-01-17 (×3): 200 mg via ORAL
  Filled 2020-01-16 (×3): qty 1

## 2020-01-16 MED ORDER — VANCOMYCIN HCL IN DEXTROSE 1-5 GM/200ML-% IV SOLN
1000.0000 mg | INTRAVENOUS | Status: AC
  Administered 2020-01-16: 1000 mg via INTRAVENOUS
  Filled 2020-01-16: qty 200

## 2020-01-16 MED ORDER — EPHEDRINE SULFATE-NACL 50-0.9 MG/10ML-% IV SOSY
PREFILLED_SYRINGE | INTRAVENOUS | Status: DC | PRN
  Administered 2020-01-16: 15 mg via INTRAVENOUS
  Administered 2020-01-16: 10 mg via INTRAVENOUS

## 2020-01-16 MED ORDER — BISACODYL 5 MG PO TBEC: 5.0000 mg | DELAYED_RELEASE_TABLET | Freq: Every day | ORAL | Status: DC | PRN

## 2020-01-16 MED FILL — tiZANidine HCL 2 MG TABS: 2 | 15 days supply | Qty: 60 | Fill #0

## 2020-01-16 MED FILL — OXYCODONE-APAP 5-325MG: 5-325 | 5 days supply | Qty: 30 | Fill #0

## 2020-01-16 SURGICAL SUPPLY — 53 items
ATTUNE PS FEM RT SZ 5 CEM KNEE (Femur) ×3 IMPLANT
ATTUNE PSRP INSR SZ5 5 KNEE (Insert) ×2 IMPLANT
ATTUNE PSRP INSR SZ5 5MM KNEE (Insert) ×1 IMPLANT
BAG DECANTER FOR FLEXI CONT (MISCELLANEOUS) ×3 IMPLANT
BAG SPEC THK2 15X12 ZIP CLS (MISCELLANEOUS) ×1
BAG ZIPLOCK 12X15 (MISCELLANEOUS) ×3 IMPLANT
BASE TIBIAL ROT PLAT SZ 5 KNEE (Knees) IMPLANT
BLADE SAG 18X100X1.27 (BLADE) ×3 IMPLANT
BLADE SAW SGTL 11.0X1.19X90.0M (BLADE) ×3 IMPLANT
BLADE SURG SZ10 CARB STEEL (BLADE) ×6 IMPLANT
BNDG CMPR MED 10X6 ELC LF (GAUZE/BANDAGES/DRESSINGS) ×1
BNDG ELASTIC 6X10 VLCR STRL LF (GAUZE/BANDAGES/DRESSINGS) ×3 IMPLANT
BOWL SMART MIX CTS (DISPOSABLE) ×3 IMPLANT
BSPLAT TIB 5 CMNT ROT PLAT STR (Knees) ×1 IMPLANT
CEMENT HV SMART SET (Cement) ×6 IMPLANT
COVER SURGICAL LIGHT HANDLE (MISCELLANEOUS) ×3 IMPLANT
COVER WAND RF STERILE (DRAPES) ×3 IMPLANT
CUFF TOURN SGL QUICK 34 (TOURNIQUET CUFF) ×3
CUFF TRNQT CYL 34X4.125X (TOURNIQUET CUFF) ×1 IMPLANT
DECANTER SPIKE VIAL GLASS SM (MISCELLANEOUS) ×9 IMPLANT
DRAPE U-SHAPE 47X51 STRL (DRAPES) ×3 IMPLANT
DRSG AQUACEL AG ADV 3.5X10 (GAUZE/BANDAGES/DRESSINGS) ×3 IMPLANT
DURAPREP 26ML APPLICATOR (WOUND CARE) ×3 IMPLANT
ELECT REM PT RETURN 15FT ADLT (MISCELLANEOUS) ×3 IMPLANT
GLOVE BIO SURGEON STRL SZ7.5 (GLOVE) ×3 IMPLANT
GLOVE BIO SURGEON STRL SZ8.5 (GLOVE) ×3 IMPLANT
GLOVE BIOGEL PI IND STRL 8 (GLOVE) ×1 IMPLANT
GLOVE BIOGEL PI IND STRL 9 (GLOVE) ×1 IMPLANT
GLOVE BIOGEL PI INDICATOR 8 (GLOVE) ×2
GLOVE BIOGEL PI INDICATOR 9 (GLOVE) ×2
GOWN STRL REUS W/TWL XL LVL3 (GOWN DISPOSABLE) ×6 IMPLANT
HANDPIECE INTERPULSE COAX TIP (DISPOSABLE) ×3
HOOD PEEL AWAY FLYTE STAYCOOL (MISCELLANEOUS) ×9 IMPLANT
KIT TURNOVER KIT A (KITS) IMPLANT
NEEDLE HYPO 21X1.5 SAFETY (NEEDLE) ×6 IMPLANT
NS IRRIG 1000ML POUR BTL (IV SOLUTION) ×3 IMPLANT
PACK ICE MAXI GEL EZY WRAP (MISCELLANEOUS) ×3 IMPLANT
PACK TOTAL KNEE CUSTOM (KITS) ×3 IMPLANT
PATELLA MEDIAL ATTUN 35MM KNEE (Knees) ×3 IMPLANT
PENCIL SMOKE EVACUATOR (MISCELLANEOUS) IMPLANT
PIN DRILL FIX HALF THREAD (BIT) ×3 IMPLANT
PIN STEINMAN FIXATION KNEE (PIN) ×2 IMPLANT
PROTECTOR NERVE ULNAR (MISCELLANEOUS) ×3 IMPLANT
SET HNDPC FAN SPRY TIP SCT (DISPOSABLE) ×1 IMPLANT
SUT VIC AB 1 CTX 36 (SUTURE) ×3
SUT VIC AB 1 CTX36XBRD ANBCTR (SUTURE) ×1 IMPLANT
SUT VIC AB 3-0 CT1 27 (SUTURE) ×9
SUT VIC AB 3-0 CT1 TAPERPNT 27 (SUTURE) ×3 IMPLANT
SYR CONTROL 10ML LL (SYRINGE) ×6 IMPLANT
TIBIAL BASE ROT PLAT SZ 5 KNEE (Knees) ×3 IMPLANT
TRAY FOLEY MTR SLVR 14FR STAT (SET/KITS/TRAYS/PACK) ×2 IMPLANT
WATER STERILE IRR 1000ML POUR (IV SOLUTION) ×6 IMPLANT
YANKAUER SUCT BULB TIP 10FT TU (MISCELLANEOUS) ×3 IMPLANT

## 2020-01-16 NOTE — Evaluation (Signed)
Physical Therapy Evaluation Patient Details Name: Joanna Willis MRN: [id number] DOB: October 16, 1955 Today's Date: 01/16/2020   History of Present Illness  s/p  TKA. PMH: L TKA  Clinical Impression  Pt is s/p TKA resulting in the deficits listed below (see PT Problem List).  PT amb ~65' with RW and min/guard, distance limited by PT to keep pain and muscle spasms controlled. Initiated HEP. Anticipate steady progress in acute setting.   Pt will benefit from skilled PT to increase their independence and safety with mobility to allow discharge to the venue listed below.      Follow Up Recommendations Follow surgeon's recommendation for DC plan and follow-up therapies    Equipment Recommendations  None recommended by PT    Recommendations for Other Services       Precautions / Restrictions Precautions Precautions: Fall;Knee Restrictions Weight Bearing Restrictions: No      Mobility  Bed Mobility Overal bed mobility: Needs Assistance Bed Mobility: Supine to Sit     Supine to sit: Min guard     General bed mobility comments: for lines and safety  Transfers Overall transfer level: Needs assistance Equipment used: Rolling walker (2 wheeled) Transfers: Sit to/from Stand Sit to Stand: Min guard         General transfer comment: cues for hand placement  Ambulation/Gait Ambulation/Gait assistance: Min assist;Min guard Gait Distance (Feet): 65 Feet Assistive device: Rolling walker (2 wheeled) Gait Pattern/deviations: Step-to pattern     General Gait Details: cues for sequence initially  Stairs            Wheelchair Mobility    Modified Rankin (Stroke Patients Only)       Balance                                             Pertinent Vitals/Pain Pain Assessment: 0-10 Pain Score: 3  Pain Location: right knee Pain Descriptors / Indicators: Grimacing;Discomfort;Spasm Pain Intervention(s): Limited activity within patient's  tolerance;Monitored during session;Premedicated before session;Repositioned;Ice applied    Home Living Family/patient expects to be discharged to:: Private residence Living Arrangements: Spouse/significant other Available Help at Discharge: Family Type of Home: House Home Access: Stairs to enter   Technical brewer of Steps: 3 Home Layout: One level;Laundry or work area in [place]: Environmental consultant - 2 wheels;Cane - single point      Prior Function Level of Independence: Independent               Hand Dominance        Extremity/Trunk Assessment   Upper Extremity Assessment Upper Extremity Assessment: Overall WFL for tasks assessed    Lower Extremity Assessment Lower Extremity Assessment: RLE deficits/detail RLE Deficits / Details: ankle WFL, knee extension and hip flexion 2+/5, knee AROM ~ 5 to 50 degrees       Communication   Communication: No difficulties  Cognition Arousal/Alertness: Awake/alert Behavior During Therapy: WFL for tasks assessed/performed Overall Cognitive Status: Within Functional Limits for tasks assessed                                        General Comments      Exercises Total Joint Exercises Ankle Circles/Pumps: AROM;Both;10 reps Quad Sets: 5 reps;Both;AROM   Assessment/Plan    PT Assessment Patient needs continued  PT services  PT Problem List Decreased strength;Decreased mobility;Decreased activity tolerance;Decreased knowledge of use of DME;Decreased range of motion       PT Treatment Interventions DME instruction;Therapeutic exercise;Gait training;Functional mobility training;Therapeutic activities;Patient/family education;Stair training    PT Goals (Current goals can be found in the Care Plan section)  Acute Rehab PT Goals Patient Stated Goal: be able to bend knee PT Goal Formulation: With patient Time For Goal Achievement: 01/23/20 Potential to Achieve Goals: Good    Frequency 7X/week    Barriers to discharge        Co-evaluation               AM-PAC PT "6 Clicks" Mobility  Outcome Measure Help needed turning from your back to your side while in a flat bed without using bedrails?: A Little Help needed moving from lying on your back to sitting on the side of a flat bed without using bedrails?: A Little Help needed moving to and from a bed to a chair (including a wheelchair)?: A Little Help needed standing up from a chair using your arms (e.g., wheelchair or bedside chair)?: A Little Help needed to walk in hospital room?: A Little Help needed climbing 3-5 steps with a railing? : A Little 6 Click Score: 18    End of Session Equipment Utilized During Treatment: Gait belt Activity Tolerance: Patient tolerated treatment well Patient left: in chair;with chair alarm set;with call bell/phone within reach Nurse Communication: Mobility status PT Visit Diagnosis: Difficulty in walking, not elsewhere classified (R26.2)    Time: 4076-8088 PT Time Calculation (min) (ACUTE ONLY): 25 min   Charges:   PT Evaluation $PT Eval Low Complexity: 1 Low PT Treatments $Gait Training: 8-22 mins        Baxter Flattery, PT  Acute Rehab Dept ([place]) [phone] Pager [phone]  01/16/2020   [facility] 01/16/2020, 3:29 PM

## 2020-01-16 NOTE — Anesthesia Procedure Notes (Signed)
Anesthesia Regional Block: Adductor canal block   Pre-Anesthetic Checklist: ,, timeout performed, Correct Patient, Correct Site, Correct Laterality, Correct Procedure, Correct Position, site marked, Risks and benefits discussed, pre-op evaluation,  At surgeon's request and post-op pain management  Laterality: Right  Prep: Maximum Sterile Barrier Precautions used, chloraprep       Needles:  Injection technique: Single-shot  Needle Type: Echogenic Stimulator Needle     Needle Length: 9cm  Needle Gauge: 22     Additional Needles:   Procedures:,,,, ultrasound used (permanent image in chart),,,,  Narrative:  Start time: 01/16/2020 7:09 AM End time: 01/16/2020 7:11 AM Injection made incrementally with aspirations every 5 mL.  Performed by: Personally  Anesthesiologist: Brennan Bailey, MD  Additional Notes: Risks, benefits, and alternative discussed. Patient gave consent for procedure. Patient prepped and draped in sterile fashion. Sedation administered, patient remains easily responsive to voice. Relevant anatomy identified with ultrasound guidance. Local anesthetic given in 5cc increments with no signs or symptoms of intravascular injection. No pain or paraesthesias with injection. Patient monitored throughout procedure with signs of LAST or immediate complications. Tolerated well. Ultrasound image placed in chart.  Tawny Asal, MD

## 2020-01-16 NOTE — Interval H&P Note (Signed)
History and Physical Interval Note:  01/16/2020 7:05 AM  Joanna Willis  has presented today for surgery, with the diagnosis of RIGHT KNEE OSTETOARTHRITIS.  The various methods of treatment have been discussed with the patient and family. After consideration of risks, benefits and other options for treatment, the patient has consented to  Procedure(s): RIGHT TOTAL KNEE ARTHROPLASTY (Right) as a surgical intervention.  The patient's history has been reviewed, patient examined, no change in status, stable for surgery.  I have reviewed the patient's chart and labs.  Questions were answered to the patient's satisfaction.     Kerin Salen

## 2020-01-16 NOTE — Anesthesia Postprocedure Evaluation (Signed)
Anesthesia Post Note  Patient: Joanna Willis  Procedure(s) Performed: RIGHT TOTAL KNEE ARTHROPLASTY (Right Knee)     Patient location during evaluation: PACU Anesthesia Type: Spinal Level of consciousness: awake and alert and oriented Pain management: pain level controlled Vital Signs Assessment: post-procedure vital signs reviewed and stable Respiratory status: spontaneous breathing, nonlabored ventilation and respiratory function stable Cardiovascular status: blood pressure returned to baseline Postop Assessment: no apparent nausea or vomiting, spinal receding, no headache and no backache Anesthetic complications: no   No complications documented.  Last Vitals:  Vitals:   01/16/20 0930 01/16/20 0945  BP: 111/68 107/71  Pulse: 71 66  Resp: (!) 11 (!) 10  Temp:  36.4 C  SpO2: 100% 100%    Last Pain:  Vitals:   01/16/20 0945  TempSrc:   PainSc: 0-No pain                 Brennan Bailey

## 2020-01-16 NOTE — Discharge Instructions (Signed)

## 2020-01-16 NOTE — Anesthesia Procedure Notes (Signed)
Spinal  Patient location during procedure: OR Start time: 01/16/2020 7:20 AM End time: 01/16/2020 7:22 AM Staffing Performed: anesthesiologist  Anesthesiologist: Brennan Bailey, MD Preanesthetic Checklist Completed: patient identified, IV checked, risks and benefits discussed, surgical consent, monitors and equipment checked, pre-op evaluation and timeout performed Spinal Block Patient position: sitting Prep: DuraPrep and site prepped and draped Patient monitoring: continuous pulse ox, blood pressure and heart rate Approach: left paramedian Location: L3-4 Injection technique: single-shot Needle Needle type: Pencan  Needle gauge: 24 G Needle length: 9 cm Additional Notes Risks, benefits, and alternative discussed. Patient gave consent to procedure. Prepped and draped in sitting position. Patient sedated but responsive to voice. Clear CSF obtained after 2 needle redirections (left paramedian). Positive terminal aspiration. No pain or paraesthesias with injection. Patient tolerated procedure well. Vital signs stable. Tawny Asal, MD

## 2020-01-16 NOTE — Transfer of Care (Signed)
Immediate Anesthesia Transfer of Care Note  Patient: Joanna Willis  Procedure(s) Performed: RIGHT TOTAL KNEE ARTHROPLASTY (Right Knee)  Patient Location: PACU  Anesthesia Type:Spinal  Level of Consciousness: awake, alert  and oriented  Airway & Oxygen Therapy: Patient Spontanous Breathing and Patient connected to face mask oxygen  Post-op Assessment: Report given to RN and Post -op Vital signs reviewed and stable  Post vital signs: Reviewed and stable  Last Vitals:  Vitals Value Taken Time  BP    Temp    Pulse 79 01/16/20 0906  Resp 14 01/16/20 0906  SpO2 100 % 01/16/20 0906  Vitals shown include unvalidated device data.  Last Pain:  Vitals:   01/16/20 0625  TempSrc:   PainSc: 0-No pain         Complications: No complications documented.

## 2020-01-16 NOTE — Op Note (Signed)
PATIENT ID:      Joanna Willis  MRN:     [id number] DOB/AGE:    08/29/1955 / 64 y.o.       OPERATIVE REPORT   DATE OF PROCEDURE:  01/16/2020      PREOPERATIVE DIAGNOSIS:   RIGHT KNEE OSTETOARTHRITIS      Estimated body mass index is 24.37 kg/m as calculated from the following:   Height as of this encounter: 5\' 6"  (1.676 m).   Weight as of this encounter: 68.5 kg.                                                       POSTOPERATIVE DIAGNOSIS:   Same                                                                  PROCEDURE:  Procedure(s): RIGHT TOTAL KNEE ARTHROPLASTY Using DepuyAttune RP implants #5T Femur, #5Tibia, 5 mm Attune RP bearing, 35 Patella    SURGEON: Kerin Salen  ASSISTANT:   Kerry Hough. [employer]   (Present and scrubbed throughout the case, critical for assistance with exposure, retraction, instrumentation, and closure.)        ANESTHESIA: Spinal, 20cc Exparel, 50cc 0.25% Marcaine EBL: 300 cc FLUID REPLACEMENT: 1500 cc crystaloid TOURNIQUET: DRAINS: None TRANEXAMIC ACID: 1gm IV, 2gm topical COMPLICATIONS:  None         INDICATIONS FOR PROCEDURE: The patient has  RIGHT KNEE OSTETOARTHRITIS, Var deformities, XR shows bone on bone arthritis, lateral subluxation of tibia. Patient has failed all conservative measures including anti-inflammatory medicines, narcotics, attempts at exercise and weight loss, cortisone injections and viscosupplementation.  Risks and benefits of surgery have been discussed, questions answered.   DESCRIPTION OF PROCEDURE: The patient identified by armband, received  IV antibiotics, in the holding area at [facility]. Patient taken to the operating room, appropriate anesthetic monitors were attached, and Spinal anesthesia was  induced. IV Tranexamic acid was given.Tourniquet applied high to the operative thigh. Lateral post and foot positioner applied to the table, the lower extremity was then prepped and draped in usual sterile fashion from  the toes to the tourniquet. Time-out procedure was performed. Kerry Hough. [facility] PAC, was present and scrubbed throughout the case, critical for assistance with, positioning, exposure, retraction, instrumentation, and closure.The skin and subcutaneous tissue along the incision was injected with 20 cc of a mixture of Exparel and Marcaine solution, using a 20-gauge by 1-1/2 inch needle. We began the operation, with the knee flexed 130 degrees, by making the anterior midline incision starting at handbreadth above the patella going over the patella 1 cm medial to and 4 cm distal to the tibial tubercle. Small bleeders in the skin and the subcutaneous tissue identified and cauterized. Transverse retinaculum was incised and reflected medially and a medial parapatellar arthrotomy was accomplished. the patella was everted and theprepatellar fat pad resected. The superficial medial collateral ligament was then elevated from anterior to posterior along the proximal flare of the tibia and anterior half of the menisci resected. The knee was hyperflexed exposing bone on bone arthritis. Peripheral  and notch osteophytes as well as the cruciate ligaments were then resected. We continued to work our way around posteriorly along the proximal tibia, and externally rotated the tibia subluxing it out from underneath the femur. A McHale PCL retractor was placed through the notch and a lateral Hohmann retractor placed, and we then entered the proximal tibia in line with the Depuy starter drill in line with the axis of the tibia followed by an intramedullary guide rod and 0-degree posterior slope cutting guide. The tibial cutting guide, 4 degree posterior sloped, was pinned into place allowing resection of 3 mm of bone medially and 12 mm of bone laterally. Satisfied with the tibial resection, we then entered the distal femur 2 mm anterior to the PCL origin with the intramedullary guide rod and applied the distal femoral cutting guide set at 9  mm, with 5 degrees of valgus. This was pinned along the epicondylar axis. At this point, the distal femoral cut was accomplished without difficulty. We then sized for a #5R femoral component and pinned the guide in 0 degrees of external rotation. The chamfer cutting guide was pinned into place. The anterior, posterior, and chamfer cuts were accomplished without difficulty followed by the Attune RP box cutting guide and the box cut. We also removed posterior osteophytes from the posterior femoral condyles. The posterior capsule was injected with Exparel solution. The knee was brought into full extension. We checked our extension gap and fit a 5 mm bearing. Distracting in extension with a lamina spreader,  bleeders in the posterior capsule, Posterior medial and posterior lateral gutter were cauterized.  The transexamic acid-soaked sponge was then placed in the gap of the knee in extension. The knee was flexed 30. The posterior patella cut was accomplished with the 9.5 mm Attune cutting guide, sized for a 51mm dome, and the fixation pegs drilled.The knee was then once again hyperflexed exposing the proximal tibia. We sized for a # 5 tibial base plate, applied the smokestack and the conical reamer followed by the the Delta fin keel punch. We then hammered into place the Attune RP trial femoral component, drilled the lugs, inserted a  5 mm trial bearing, trial patellar button, and took the knee through range of motion from 0-130 degrees. Medial and lateral ligamentous stability was checked. No thumb pressure was required for patellar Tracking. The tourniquet was not used. All trial components were removed, mating surfaces irrigated with pulse lavage, and dried with suction and sponges. 10 cc of the Exparel solution was applied to the cancellus bone of the patella distal femur and proximal tibia.  After waiting 30 seconds, the bony surfaces were again, dried with sponges. A double batch of DePuy HV cement was mixed and  applied to all bony metallic mating surfaces except for the posterior condyles of the femur itself. In order, we hammered into place the tibial tray and removed excess cement, the femoral component and removed excess cement. The final Attune RP bearing was inserted, and the knee brought to full extension with compression. The patellar button was clamped into place, and excess cement removed. The knee was held at 30 flexion with compression, while the cement cured. The wound was irrigated out with normal saline solution pulse lavage. The rest of the Exparel was injected into the parapatellar arthrotomy, subcutaneous tissues, and periosteal tissues. The parapatellar arthrotomy was closed with running #1 Vicryl suture. The subcutaneous tissue with 3-0 undyed Vicryl suture, and the skin with running 3-0 SQ vicryl. An Aquacil and  Ace wrap were applied. The patient was taken to recovery room without difficulty.   Kerin Salen 01/16/2020, 7:06 AM

## 2020-01-17 ENCOUNTER — Encounter (HOSPITAL_COMMUNITY): Payer: Self-pay | Admitting: Orthopedic Surgery

## 2020-01-17 DIAGNOSIS — Z96651 Presence of right artificial knee joint: Secondary | ICD-10-CM | POA: Diagnosis not present

## 2020-01-17 DIAGNOSIS — M1711 Unilateral primary osteoarthritis, right knee: Secondary | ICD-10-CM | POA: Diagnosis not present

## 2020-01-17 LAB — BASIC METABOLIC PANEL
Anion gap: 6 (ref 5–15)
BUN: 10 mg/dL (ref 8–23)
CO2: 27 mmol/L (ref 22–32)
Calcium: 8.5 mg/dL — ABNORMAL LOW (ref 8.9–10.3)
Chloride: 104 mmol/L (ref 98–111)
Creatinine, Ser: 0.63 mg/dL (ref 0.44–1.00)
GFR calc Af Amer: >60 mL/min (ref >60)
GFR calc non Af Amer: >60 mL/min (ref >60)
Glucose, Bld: 150 mg/dL — ABNORMAL HIGH (ref 70–99)
Potassium: 4.2 mmol/L (ref 3.5–5.1)
Sodium: 137 mmol/L (ref 135–145)

## 2020-01-17 LAB — CBC
HCT: 36.4 % (ref 36.0–46.0)
Hemoglobin: 11.5 g/dL — ABNORMAL LOW (ref 12.0–15.0)
MCH: 31 pg (ref 26.0–34.0)
MCHC: 31.6 g/dL (ref 30.0–36.0)
MCV: 98.1 fL (ref 80.0–100.0)
Platelets: 228 10*3/uL (ref 150–400)
RBC: 3.71 MIL/uL — ABNORMAL LOW (ref 3.87–5.11)
RDW: 12.5 % (ref 11.5–15.5)
WBC: 11.2 10*3/uL — ABNORMAL HIGH (ref 4.0–10.5)
nRBC: 0 % (ref 0.0–0.2)

## 2020-01-17 NOTE — Discharge Summary (Signed)
Patient ID: Joanna Willis MRN: [id number] DOB/AGE: 63-30-1957 64 y.o.  Admit date: 01/16/2020 Discharge date: 01/17/2020  Admission Diagnoses:  Principal Problem:   Osteoarthritis of right knee Active Problems:   Status post right knee replacement   Discharge Diagnoses:  Same  Past Medical History:  Diagnosis Date  . Allergy   . Arthritis   . Depression   . GERD (gastroesophageal reflux disease)   . Hypercholesteremia   . PONV (postoperative nausea and vomiting)   . Postmenopausal   . Wears glasses     Surgeries: Procedure(s): RIGHT TOTAL KNEE ARTHROPLASTY on 01/16/2020   Consultants:   Discharged Condition: Improved  Hospital Course: Joanna Willis is an 64 y.o. female who was admitted 01/16/2020 for operative treatment ofOsteoarthritis of right knee. Patient has severe unremitting pain that affects sleep, daily activities, and work/hobbies. After pre-op clearance the patient was taken to the operating room on 01/16/2020 and underwent  Procedure(s): RIGHT TOTAL KNEE ARTHROPLASTY.    Patient was given perioperative antibiotics:  Anti-infectives (From admission, onward)   Start     Dose/Rate Route Frequency Ordered Stop   01/16/20 0600  vancomycin (VANCOCIN) IVPB 1000 mg/200 mL premix        1,000 mg 200 mL/hr over 60 Minutes Intravenous On call to O.R. 01/16/20 0630 01/16/20 0715       Patient was given sequential compression devices, early ambulation, and chemoprophylaxis to prevent DVT.  Patient benefited maximally from hospital stay and there were no complications.    Recent vital signs:  Patient Vitals for the past 24 hrs:  BP Temp Temp src Pulse Resp SpO2  01/17/20 0553 (Abnormal) 96/63 97.9 F (36.6 C) no documentation 62 18 100 %  01/17/20 0124 106/66 98.1 F (36.7 C) no documentation 64 20 97 %  01/16/20 2149 111/70 98.2 F (36.8 C) no documentation 74 20 100 %  01/16/20 1255 124/72 97.6 F (36.4 C) Oral 62 17 100 %  01/16/20 1208 113/66 98 F (36.7  C) no documentation 62 14 100 %  01/16/20 1102 125/79 97.7 F (36.5 C) no documentation 66 14 100 %  01/16/20 1006 114/81 (Abnormal) 97.5 F (36.4 C) no documentation 70 16 100 %  01/16/20 0945 107/71 97.6 F (36.4 C) no documentation 66 (Abnormal) 10 100 %  01/16/20 0930 111/68 no documentation no documentation 71 (Abnormal) 11 100 %  01/16/20 0915 110/65 no documentation no documentation 71 17 100 %  01/16/20 0904 (Abnormal) 104/61 (Abnormal) 97.5 F (36.4 C) no documentation 72 12 100 %     Recent laboratory studies:  Recent Labs    01/17/20 0306  WBC 11.2*  HGB 11.5*  HCT 36.4  PLT 228  NA 137  K 4.2  CL 104  CO2 27  BUN 10  CREATININE 0.63  GLUCOSE 150*  CALCIUM 8.5*     Discharge Medications:   Allergies as of 01/17/2020    Allergen Reactions Comment   Dust Mite Mixed Allergen Ext  [mite (d. Farinae)] Swelling    Penicillins Rash, Other (See Comments) As a child. Has patient had a PCN reaction causing immediate rash, facial/tongue/throat swelling, SOB or lightheadedness with hypotension: Unknown Has patient had a PCN reaction causing severe rash involving mucus membranes or skin necrosis: Unknown Has patient had a PCN reaction that required hospitalization: Unknown Has patient had a PCN reaction occurring within the last 10 years: Unknown If all of the above answers are "NO", then may proceed with Cephalosporin use.  Medication List    Stop taking these medications   acetaminophen 500 MG tablet Commonly known as: TYLENOL   meloxicam 15 MG tablet Commonly known as: MOBIC   traMADol 50 MG tablet Commonly known as: ULTRAM     Take these medications   aspirin EC 81 MG tablet Take 1 tablet (81 mg total) by mouth 2 (two) times daily.   atorvastatin 20 MG tablet Commonly known as: LIPITOR Take 20 mg by mouth at bedtime.   estradiol 0.025 MG/24HR Commonly known as: VIVELLE-DOT Place 1 patch onto the skin 2 (two) times a week. Mondays & Thursdays    fluticasone 50 MCG/ACT nasal spray Commonly known as: FLONASE Place 1 spray into both nostrils daily as needed for allergies or rhinitis.   omeprazole 20 MG capsule Commonly known as: PRILOSEC Take 20 mg by mouth daily before breakfast.   oxyCODONE-acetaminophen 5-325 MG tablet Commonly known as: PERCOCET/ROXICET Take 1 tablet by mouth every 4 (four) hours as needed for severe pain.   rOPINIRole 0.25 MG tablet Commonly known as: REQUIP Take 0.25 mg by mouth at bedtime as needed (restless leg).   tiZANidine 2 MG tablet Commonly known as: ZANAFLEX Take 1 tablet (2 mg total) by mouth every 6 (six) hours as needed.        Durable Medical Equipment  (From admission, onward)         Start     Ordered   01/16/20 1003  DME Walker rolling  Once       Question:  Patient needs a walker to treat with the following condition  Answer:  Status post right knee replacement   01/16/20 1002   01/16/20 1003  DME 3 n 1  Once        01/16/20 1002           Discharge Care Instructions  (From admission, onward)         Start     Ordered   01/17/20 0000  Change dressing       Comments: Change dressing Only if drainage exceeds 40% of window on dressing   01/17/20 0801          Diagnostic Studies: DG Chest 2 View  Result Date: 01/12/2020 CLINICAL DATA:  Preoperative, right knee replacement EXAM: CHEST - 2 VIEW COMPARISON:  None. FINDINGS: The heart size and mediastinal contours are within normal limits. Both lungs are clear. The visualized skeletal structures are unremarkable. IMPRESSION: No acute abnormality of the lungs. Electronically Signed   By: Eddie Candle M.D.   On: 01/12/2020 16:47   MR KNEE RIGHT WO CONTRAST  Result Date: 12/19/2019 CLINICAL DATA:  Right knee pain.  Medial pain for 5 weeks. EXAM: MRI OF THE RIGHT KNEE WITHOUT CONTRAST TECHNIQUE: Multiplanar, multisequence MR imaging of the knee was performed. No intravenous contrast was administered. COMPARISON:  None.  FINDINGS: MENISCI Medial meniscus: Large radial tear of the posterior horn of the medial meniscus with peripheral meniscal extrusion. Degeneration of the body and posterior horn of the medial meniscus. Under surface tear of the posterior horn-body junction of the medial meniscus. Lateral meniscus: Degeneration of the anterior horn of the lateral meniscus. Otherwise intact. LIGAMENTS Cruciates: Intact ACL and PCL. Increased signal and mild expansion of the ACL consistent with mucinous degeneration. Collaterals: Medial collateral ligament is intact. Lateral collateral ligament complex is intact. CARTILAGE Patellofemoral: Partial-thickness cartilage loss of the patellofemoral compartment with high-grade partial-thickness cartilage loss of the lateral patellofemoral compartment. Medial: Extensive full-thickness cartilage loss  of the medial femorotibial compartment with severe subchondral marrow edema in the medial femoral condyle. Lateral: Partial-thickness cartilage loss of the lateral femorotibial compartment. Joint: Large joint effusion. Minimal edema in Hoffa's fat. No plical thickening. Popliteal Fossa: No significant Baker's cyst. Intact popliteus tendon. Extensor Mechanism: Intact quadriceps tendon. Intact patellar tendon. Intact medial patellar retinaculum. Intact lateral patellar retinaculum. Intact MPFL. Bones:  No acute osseous abnormality.  No aggressive osseous lesion. Other: No fluid collection or hematoma. Muscles are normal. Small amount of fluid in the pes answering bursa. IMPRESSION: 1. Large radial tear of the posterior horn of the medial meniscus with peripheral meniscal extrusion. Degeneration of the body and posterior horn of the medial meniscus. Under surface tear of the posterior horn-body junction of the medial meniscus. 2. Tricompartmental cartilage abnormalities as described above most severe in the medial femorotibial compartment. 3. Large joint effusion. Electronically Signed   By: Kathreen Devoid   On: 12/19/2019 08:14    Disposition: Discharge disposition: 01-Home or Self Care       Discharge Instructions    Call MD / Call 911   Complete by: As directed    If you experience chest pain or shortness of breath, CALL 911 and be transported to the hospital emergency room.  If you develope a fever above 101 F, pus (white drainage) or increased drainage or redness at the wound, or calf pain, call your surgeon's office.   Change dressing   Complete by: As directed    Change dressing Only if drainage exceeds 40% of window on dressing   Constipation Prevention   Complete by: As directed    Drink plenty of fluids.  Prune juice may be helpful.  You may use a stool softener, such as Colace (over the counter) 100 mg twice a day.  Use MiraLax (over the counter) for constipation as needed.   Diet - low sodium heart healthy   Complete by: As directed    Increase activity slowly as tolerated   Complete by: As directed        Follow-up Information    Frederik Pear, MD In 2 weeks.   Specialty: Orthopedic Surgery Contact information: [place] [id number] [phone]                Signed: Kerin Salen 01/17/2020, 8:02 AM

## 2020-01-17 NOTE — Progress Notes (Signed)
Patient ID: Joanna Willis, female   DOB: Jan 25, 1956, 64 y.o.   MRN: [id number] PATIENT ID: Joanna Willis  MRN: [id number]  DOB/AGE:  1956/01/07 / 64 y.o.  1 Day Post-Op Procedure(s) (LRB): RIGHT TOTAL KNEE ARTHROPLASTY (Right)    PROGRESS NOTE Subjective: Patient is alert, oriented, no Nausea, no Vomiting, yes passing gas. Taking PO well. Denies SOB, Chest or Calf Pain. Using Incentive Spirometer, PAS in place. Ambulate 60, Patient reports pain as 2/10 .    Objective: Vital signs in last 24 hours: Vitals:   01/16/20 1255 01/16/20 2149 01/17/20 0124 01/17/20 0553  BP: 124/72 111/70 106/66 (Abnormal) 96/63  Pulse: 62 74 64 62  Resp: 17 20 20 18   Temp: 97.6 F (36.4 C) 98.2 F (36.8 C) 98.1 F (36.7 C) 97.9 F (36.6 C)  TempSrc: Oral     SpO2: 100% 100% 97% 100%  Weight:      Height:          Intake/Output from previous day: I/O last 3 completed shifts: In: 3414.1 [P.O.:360; I.V.:2954.1; IV Piggyback:100] Out: 3007 [Urine:4050; Blood:300]   Intake/Output this shift: No intake/output data recorded.   LABORATORY DATA: Recent Labs    01/17/20 0306  WBC 11.2*  HGB 11.5*  HCT 36.4  PLT 228  NA 137  K 4.2  CL 104  CO2 27  BUN 10  CREATININE 0.63  GLUCOSE 150*  CALCIUM 8.5*    Examination: Neurologically intact ABD soft Neurovascular intact Sensation intact distally Intact pulses distally Dorsiflexion/Plantar flexion intact Incision: dressing C/D/I No cellulitis present Compartment soft}  Assessment:   1 Day Post-Op Procedure(s) (LRB): RIGHT TOTAL KNEE ARTHROPLASTY (Right) ADDITIONAL DIAGNOSIS: Expected Acute Blood Loss Anemia,   Patient's anticipated LOS is less than 2 midnights, meeting these requirements: - Younger than 12 - Lives within 1 hour of care - Has a competent adult at home to recover with post-op recover - NO history of  - Chronic pain requiring opiods  - Diabetes  - Coronary Artery Disease  - Heart failure  - Heart attack  -  Stroke  - DVT/VTE  - Cardiac arrhythmia  - Respiratory Failure/COPD  - Renal failure  - Anemia  - Advanced Liver disease       Plan: PT/OT WBAT, AROM and PROM  DVT Prophylaxis:  SCDx72hrs, ASA 81 mg BID x 2 weeks DISCHARGE PLAN: Home, after PT DISCHARGE NEEDS: HHPT, Walker and 3-in-1 comode seat     Kerin Salen 01/17/2020, 7:59 AM

## 2020-01-17 NOTE — Progress Notes (Signed)
RN reviewed discharge instructions with patient and family. All questions answered.   Paperwork given. Prescriptions electronically sent to patient pharmacy.    NT rolled patient down with all belongings to family car.     Chianna Spirito, RN  

## 2020-01-17 NOTE — Progress Notes (Signed)
Physical Therapy Treatment Patient Details Name: Joanna Willis MRN: [id number] DOB: September 03, 1955 Today's Date: 01/17/2020    History of Present Illness s/p  TKA. PMH: L TKA    PT Comments    Pt progressing well today. Has some mild dizziness d/t meds. amb hallway distance, reviewed stairs and TKA HEP. Pt is ready for d/c from PT standpoint.  Follow Up Recommendations  Follow surgeon's recommendation for DC plan and follow-up therapies     Equipment Recommendations  None recommended by PT    Recommendations for Other Services       Precautions / Restrictions Precautions Precautions: Fall;Knee Restrictions Weight Bearing Restrictions: No    Mobility  Bed Mobility Overal bed mobility: Modified Independent                Transfers Overall transfer level: Needs assistance Equipment used: Rolling walker (2 wheeled) Transfers: Sit to/from Stand Sit to Stand: Supervision         General transfer comment: cues for hand placement  Ambulation/Gait Ambulation/Gait assistance: Supervision;Min guard Gait Distance (Feet): 160 Feet Assistive device: Rolling walker (2 wheeled) Gait Pattern/deviations: Step-through pattern     General Gait Details: supervision for safety, mild dizziness d/t meds   Stairs Stairs: Yes Stairs assistance: Min guard;Min assist Stair Management: One rail Right;Two rails;Forwards (one rail and HHA) Number of Stairs: 3 General stair comments: cues for sequence. reviewed use of cane and rail. pt familiar with techniques from previous knee surgeries   Wheelchair Mobility    Modified Rankin (Stroke Patients Only)       Balance                                            Cognition Arousal/Alertness: Awake/alert Behavior During Therapy: WFL for tasks assessed/performed Overall Cognitive Status: Within Functional Limits for tasks assessed                                        Exercises Total  Joint Exercises Ankle Circles/Pumps: AROM;Both;10 reps Quad Sets: AROM;Both;10 reps Short Arc Quad: AROM;Right;10 reps Heel Slides: AAROM;Right;10 reps Hip ABduction/ADduction: AROM;Right;10 reps Straight Leg Raises: AROM;Right;10 reps Long Arc Quad: AROM;Right;10 reps;Seated Knee Flexion: Right;10 reps;AROM Goniometric ROM: grossly 6 to 70 degrees active right knee flexion     General Comments        Pertinent Vitals/Pain Pain Assessment: 0-10 Pain Score: 4  Pain Location: right knee Pain Descriptors / Indicators: Grimacing;Discomfort;Spasm Pain Intervention(s): Limited activity within patient's tolerance;Monitored during session;Premedicated before session;Repositioned    Home Living                      Prior Function            PT Goals (current goals can now be found in the care plan section) Acute Rehab PT Goals Patient Stated Goal: be able to bend knee PT Goal Formulation: With patient Time For Goal Achievement: 01/23/20 Potential to Achieve Goals: Good Progress towards PT goals: Progressing toward goals    Frequency    7X/week      PT Plan Current plan remains appropriate    Co-evaluation              AM-PAC PT "6 Clicks" Mobility   Outcome Measure  Help needed  turning from your back to your side while in a flat bed without using bedrails?: A Little Help needed moving from lying on your back to sitting on the side of a flat bed without using bedrails?: A Little Help needed moving to and from a bed to a chair (including a wheelchair)?: A Little Help needed standing up from a chair using your arms (e.g., wheelchair or bedside chair)?: A Little Help needed to walk in hospital room?: A Little Help needed climbing 3-5 steps with a railing? : A Little 6 Click Score: 18    End of Session Equipment Utilized During Treatment: Gait belt Activity Tolerance: Patient tolerated treatment well Patient left: in chair;with chair alarm set;with call  bell/phone within reach Nurse Communication: Mobility status PT Visit Diagnosis: Difficulty in walking, not elsewhere classified (R26.2)     Time: 0388-8280 PT Time Calculation (min) (ACUTE ONLY): 23 min  Charges:  $Gait Training: 8-22 mins $Therapeutic Exercise: 8-22 mins                     Baxter Flattery, PT  Acute Rehab Dept ([place]) [phone] Pager [phone]  01/17/2020    [facility] 01/17/2020, 11:46 AM

## 2020-01-17 NOTE — Plan of Care (Signed)
Plan of care reviewed and discussed with the patient. 

## 2020-01-17 NOTE — TOC Transition Note (Signed)
Transition of Care [facility]) - CM/SW Discharge Note   Patient Details  Name: Joanna Willis MRN: [id number] Date of Birth: Jun 11, 1956  [facility]) CM/SW Contact:  Lia Hopping, [place] Phone Number: 01/17/2020, 10:04 AM   Clinical Narrative:    Patient confirm her therapy Plan is: OPPT @ [place] Patient has RW and High toilet seats   Final next level of care: OP Rehab Barriers to Discharge: No Barriers Identified   Patient Goals and CMS Choice     Choice offered to / list presented to : NA  Discharge Placement                       Discharge Plan and Services                DME Arranged: N/A DME Agency: NA       HH Arranged: NA HH Agency: NA        Social Determinants of Health (SDOH) Interventions     Readmission Risk Interventions No flowsheet data found.

## 2020-01-18 ENCOUNTER — Other Ambulatory Visit: Payer: Self-pay

## 2020-01-18 ENCOUNTER — Ambulatory Visit: Payer: 59 | Attending: Orthopedic Surgery | Admitting: Physical Therapy

## 2020-01-18 ENCOUNTER — Encounter: Payer: Self-pay | Admitting: Physical Therapy

## 2020-01-18 DIAGNOSIS — M25661 Stiffness of right knee, not elsewhere classified: Secondary | ICD-10-CM | POA: Diagnosis not present

## 2020-01-18 DIAGNOSIS — R2689 Other abnormalities of gait and mobility: Secondary | ICD-10-CM | POA: Diagnosis not present

## 2020-01-18 DIAGNOSIS — M6281 Muscle weakness (generalized): Secondary | ICD-10-CM | POA: Diagnosis not present

## 2020-01-18 DIAGNOSIS — M25561 Pain in right knee: Secondary | ICD-10-CM | POA: Insufficient documentation

## 2020-01-18 NOTE — Therapy (Signed)
[facility] [address]  [place], [place], [address] Phone: [phone]   Fax:  [phone]  Physical Therapy Evaluation  Patient Details  Name: Joanna Willis MRN: [id number] Date of Birth: [birth year] Referring Provider (PT): Frederik Pear, MD   Encounter Date: 01/18/2020   PT End of Session - 01/18/20 1354    Visit Number 1    Number of Visits 17    Date for PT Re-Evaluation 03/14/20    Authorization Type [facility]    PT Start Time 5462    PT Stop Time 1346    PT Time Calculation (min) 28 min    Equipment Utilized During Treatment Gait belt    Activity Tolerance Patient limited by pain;Patient tolerated treatment well    Behavior During Therapy [facility] for tasks assessed/performed           Past Medical History:  Diagnosis Date   Allergy    Arthritis    Depression    GERD (gastroesophageal reflux disease)    Hypercholesteremia    PONV (postoperative nausea and vomiting)    Postmenopausal    Wears glasses     Past Surgical History:  Procedure Laterality Date   ABDOMINAL HYSTERECTOMY  2005   did not remove ovaries   CESAREAN SECTION  2000   COLONOSCOPY     CONVERSION TO TOTAL KNEE Left 01/13/2018   Procedure: Revision of Left knee unicompartmental arthroplasty to total knee arthroplasty;  Surgeon: [name], MD;  Location: [facility];  Service: Orthopedics;  Laterality: Left;   FOOT ARTHRODESIS  1995   right   KNEE ARTHROSCOPY WITH MEDIAL MENISECTOMY Left 05/17/2014   Procedure: LEFT ARTHROSCOPY KNEE WITH PARTIAL MEDIAL MENISECTOMY, CHONDROPLASTY OF LATERAL FEMORAL CONDYLE;  Surgeon: Alta Corning, MD;  Location: [place];  Service: Orthopedics;  Laterality: Left;   KNEE CLOSED REDUCTION Left 07/21/2018   Procedure: CLOSED MANIPULATION KNEE;  Surgeon: [name], MD;  Location: [facility];  Service: Orthopedics;  Laterality: Left;  62min   PARTIAL KNEE ARTHROPLASTY Left  01/21/2017   Procedure: UNICOMPARTMENTAL LEFT KNEE;  Surgeon: [name], MD;  Location[facility];  Service: Orthopedics;  Laterality: Left;   Revision left knee     Dr. Wynelle Link 01-13-18   TOTAL KNEE ARTHROPLASTY Right 01/16/2020   Procedure: RIGHT TOTAL KNEE ARTHROPLASTY;  Surgeon: Frederik Pear, MD;  Location: [facility];  Service: Orthopedics;  Laterality: Right;    There were no vitals filed for this visit.    Subjective Assessment - 01/18/20 1319    Subjective Present presenting in wheelchair with husband. Reporting "this is not a good day" d/t pain levels from a 50 minute car ride to her appointment and from feeling woozy from the pain meds she took. Reports that she underwent R TKA on 01/16/20. Has been walking with RW but not currently d/t feeling woozy. Denies calf pain, redness, warmth. Denies fever and chills.    Patient is accompained by: Family member   husband   Pertinent History HLD, depression, GERD, L TKA with revision and closed manipulation 2018-2020    Limitations Sitting;Lifting;Standing;Walking    Diagnostic tests none recent    Patient Stated Goals get rid of pain    Currently in Pain? Yes    Pain Score 5     Pain Location Knee    Pain Orientation Right    Pain Descriptors / Indicators Aching    Pain Type Acute pain;Surgical pain  [facility] PT Assessment - 01/18/20 1323      Assessment   Medical Diagnosis s/p R TKA    Referring Provider (PT) Frederik Pear, MD    Onset Date/Surgical Date 01/16/20    Next MD Visit pt unsure    Prior Therapy yes- L TKA      Balance Screen   Has the patient fallen in the past 6 months No    Has the patient had a decrease in activity level because of a fear of falling?  No    Is the patient reluctant to leave their home because of a fear of falling?  No      Home Ecologist residence    Living Arrangements Spouse/significant other    Available Help at Discharge Family    Type of [place] to enter    Entrance Stairs-Number of Steps [place] One level    [place] - 2 wheels;[place] - single point      Prior Function   Level of Independence Independent    Vocation Part time employment    Engineer, mining at [employer]    Leisure riding a bike      Cognition   Overall Cognitive Status Within Functional Limits for tasks assessed      Sensation   Light Touch Appears Intact      Coordination   Gross Motor Movements are Fluid and Coordinated Yes      Posture/Postural Control   Posture/Postural Control Postural limitations    Postural Limitations Rounded Shoulders;Forward head      ROM / Strength   AROM / PROM / Strength AROM;PROM;Strength      AROM   AROM Assessment Site Knee    Right/Left Knee Right    Right Knee Extension 10   pain   Right Knee Flexion 45   pain     PROM   PROM Assessment Site Knee    Right/Left Knee Right    Right Knee Extension 9   pain   Right Knee Flexion 60   pain     Strength   Strength Assessment Site Hip;Knee;Ankle    Right/Left Hip Right;Left    Right Hip Flexion 3-/5    Right Hip ABduction 4-/5    Right Hip ADduction 4-/5    Left Hip Flexion 4+/5    Left Hip ABduction 4+/5    Left Hip ADduction 4+/5    Right/Left Knee Right;Left    Right Knee Flexion 2+/5    Right Knee Extension 2+/5    Left Knee Flexion 4+/5    Left Knee Extension 4+/5    Right/Left Ankle Right;Left    Right Ankle Dorsiflexion 4/5    Right Ankle Plantar Flexion 4-/5    Left Ankle Dorsiflexion 4+/5    Left Ankle Plantar Flexion 4+/5      Palpation   Palpation comment moderately edematous in the R knee and thigh; R calf supple and nontender      Ambulation/Gait   Assistive device Rolling walker    Gait Pattern Step-to pattern;Decreased stance time - right;Decreased step length - left;Decreased hip/knee flexion - right;Decreased weight shift to  right;Trunk flexed    Ambulation Surface Level;Indoor    Gait velocity decreased  Objective measurements completed on examination: See above findings.               PT Education - 01/18/20 1354    Education Details prognosis, POC, HEP    Person(s) Educated Patient    Methods Explanation;Demonstration;Tactile cues;Verbal cues;Handout    Comprehension Verbalized understanding            PT Short Term Goals - 01/18/20 1657      PT SHORT TERM GOAL #1   Title Pt will be independent with initial HEP    Period Weeks    Status New    Target Date 02/08/20             PT Long Term Goals - 01/18/20 1657      PT LONG TERM GOAL #1   Title Pt to be independent with advanced HEP    Time 8    Period Weeks    Status New    Target Date 03/14/20      PT LONG TERM GOAL #2   Title Patient to demonstrate 0-120 degrees of AROM at R knee to improve gait and abilty to perform ADLs independently.    Time 8    Period Weeks    Status New    Target Date 03/14/20      PT LONG TERM GOAL #3   Title Patient to demonstrate B LE strength >/=4+/5.    Time 8    Period Weeks    Status New    Target Date 03/14/20      PT LONG TERM GOAL #4   Title Patient to demonstrate R SLR without quad lag.    Time 8    Period Weeks    Status New    Target Date 03/14/20      PT LONG TERM GOAL #5   Title Pt will demonstrate normal gait pattern with LRAD in order to improve functional mobility and decrease fall risk    Time 8    Period Weeks    Status New    Target Date 03/14/20                  Plan - 01/18/20 1651    Clinical Impression Statement Patient is a 64y/o F presenting to OPPT with husband with c/o R knee pain s/p R TKA on 01/16/20. Patient arrived in w/c d/t pain levels and c/o wooziness from her pain meds. Noting increased pain and edema in the R knee today compared to yesterday, however denies fever or chills. R LE also without  redness, warmth, or tenderness. D/t pain levels and poor positional tolerance, assessment was condensed today. Patient presenting with moderate R knee edema, limited and painful R knee ROM, decreased R LE strength, and gait deviations. Patient was educated on gentle stretching and strengthening HEP- patient reported understanding. Patient also demonstrated good knowledge of post-op precautions. Would benefit from skilled PT services 2x/week for 8 weeks to address aforementioned impairments.    Personal Factors and Comorbidities Age;Behavior Pattern;Comorbidity 3+;Past/Current Experience;Profession;Time since onset of injury/illness/exacerbation    Comorbidities HLD, depression, GERD, L TKA with revision and closed manipulation 2018-2020    Examination-Activity Limitations Sit;Bathing;Bed Mobility;Sleep;Squat;Bend;Caring for Others;Stairs;Stand;Carry;Toileting;Dressing;Transfers;Hygiene/Grooming;Lift;Locomotion Level;Reach Overhead    Examination-Participation Restrictions Church;Cleaning;Shop;Community Activity;Volunteer;Driving;Yard Work;Laundry;Meal Prep    Stability/Clinical Decision Making Stable/Uncomplicated    Clinical Decision Making Low    Rehab Potential Good    PT Frequency 2x / week    PT Duration 8 weeks    PT Treatment/Interventions ADLs/Self  Care Home Management;Cryotherapy;Electrical Stimulation;Moist Heat;Balance training;Therapeutic exercise;Therapeutic activities;Functional mobility training;Stair training;Gait training;Ultrasound;Neuromuscular re-education;Patient/family education;Manual techniques;Vasopneumatic Device;Taping;Energy conservation;Dry needling;Passive range of motion;Scar mobilization    PT Next Visit Plan knee FOTO; add heel slides into HEP, progress R knee ROM and strength, gait training with RW    Consulted and Agree with Plan of Care Patient           Patient will benefit from skilled therapeutic intervention in order to improve the following deficits and  impairments:  Hypomobility, Decreased endurance, Abnormal gait, Increased edema, Decreased scar mobility, Decreased activity tolerance, Decreased strength, Increased fascial restricitons, Pain, Decreased balance, Difficulty walking, Increased muscle spasms, Improper body mechanics, Decreased range of motion, Impaired flexibility, Postural dysfunction  Visit Diagnosis: Acute pain of right knee  Stiffness of right knee, not elsewhere classified  Muscle weakness (generalized)  Other abnormalities of gait and mobility     Problem List Patient Active Problem List   Diagnosis Date Noted   Status post right knee replacement 01/16/2020   Osteoarthritis of right knee 01/13/2020   Hormone replacement therapy (HRT) 03/03/2018   S/P left unicompartmental knee replacement 01/13/2018   Arthrofibrosis of total knee arthroplasty, subsequent encounter 01/21/2017   Adjustment disorder with mixed anxiety and depressed mood 02/22/2016   Mixed hyperlipidemia 08/22/2015   Left knee pain 04/19/2014   BRONCHITIS, ACUTE 07/21/2010     Janene Harvey, PT, DPT 01/18/20 5:01 PM   [facility] [address]  [place], [place], [address] Phone: [phone]   Fax:  [phone]  Name: AALLIYAH KILKER MRN: [id number] Date of Birth: [birth year]

## 2020-01-20 ENCOUNTER — Ambulatory Visit: Payer: 59

## 2020-01-20 ENCOUNTER — Other Ambulatory Visit: Payer: Self-pay

## 2020-01-20 DIAGNOSIS — M6281 Muscle weakness (generalized): Secondary | ICD-10-CM | POA: Diagnosis not present

## 2020-01-20 DIAGNOSIS — M25561 Pain in right knee: Secondary | ICD-10-CM

## 2020-01-20 DIAGNOSIS — M25661 Stiffness of right knee, not elsewhere classified: Secondary | ICD-10-CM

## 2020-01-20 DIAGNOSIS — R2689 Other abnormalities of gait and mobility: Secondary | ICD-10-CM

## 2020-01-20 NOTE — Therapy (Signed)
[facility] [address]  [place], [place], [address] Phone: [phone]   Fax:  [phone]  Physical Therapy Treatment  Patient Details  Name: Joanna Willis MRN: [id number] Date of Birth: 05-Apr-1956 Referring Provider (PT): Frederik Pear, MD   Encounter Date: 01/20/2020   PT End of Session - 01/20/20 0853    Visit Number 2    Number of Visits 17    Date for PT Re-Evaluation 03/14/20    Authorization Type [facility]    PT Start Time 0845    PT Stop Time 0933    PT Time Calculation (min) 48 min    Equipment Utilized During Treatment --    Activity Tolerance Patient limited by pain;Patient tolerated treatment well    Behavior During Therapy [facility] for tasks assessed/performed           Past Medical History:  Diagnosis Date  . Allergy   . Arthritis   . Depression   . GERD (gastroesophageal reflux disease)   . Hypercholesteremia   . PONV (postoperative nausea and vomiting)   . Postmenopausal   . Wears glasses     Past Surgical History:  Procedure Laterality Date  . ABDOMINAL HYSTERECTOMY  2005   did not remove ovaries  . CESAREAN SECTION  2000  . COLONOSCOPY    . CONVERSION TO TOTAL KNEE Left 01/13/2018   Procedure: Revision of Left knee unicompartmental arthroplasty to total knee arthroplasty;  Surgeon: [name], MD;  [facility];  Service: Orthopedics;  Laterality: Left;  . FOOT ARTHRODESIS  1995   right  . KNEE ARTHROSCOPY WITH MEDIAL MENISECTOMY Left 05/17/2014   Procedure: LEFT ARTHROSCOPY KNEE WITH PARTIAL MEDIAL MENISECTOMY, CHONDROPLASTY OF LATERAL FEMORAL CONDYLE;  Surgeon: Alta Corning, MD;  Location: [place];  Service: Orthopedics;  Laterality: Left;  . KNEE CLOSED REDUCTION Left 07/21/2018   Procedure: CLOSED MANIPULATION KNEE;  Surgeon: [name], MD;  Location[facility];  Service: Orthopedics;  Laterality: Left;  17min  . PARTIAL KNEE ARTHROPLASTY Left 01/21/2017    Procedure: UNICOMPARTMENTAL LEFT KNEE;  Surgeon: [name], MD;  [facility];  Service: Orthopedics;  Laterality: Left;  . Revision left knee     Dr. Wynelle Link 01-13-18  . TOTAL KNEE ARTHROPLASTY Right 01/16/2020   Procedure: RIGHT TOTAL KNEE ARTHROPLASTY;  Surgeon: Frederik Pear, MD;  [facility];  Service: Orthopedics;  Laterality: Right;    There were no vitals filed for this visit.   Subjective Assessment - 01/20/20 0852    Subjective Took muscle relaxer before she cam to session and feeling better than last tx.    Pertinent History HLD, depression, GERD, L TKA with revision and closed manipulation 2018-2020    Diagnostic tests none recent    Patient Stated Goals get rid of pain    Currently in Pain? Yes    Pain Score 4     Pain Location Knee    Pain Orientation Right    Pain Descriptors / Indicators Aching    Pain Type Acute pain;Surgical pain    Multiple Pain Sites No              [facility] PT Assessment - 01/20/20 0001      Observation/Other Assessments   Focus on Therapeutic Outcomes (FOTO)  knee: 23% (77 % limitation)      PROM   PROM Assessment Site Knee    Right Knee Flexion 70  [facility] PT Treatment/Exercise - 01/20/20 0001      Ambulation/Gait   Ambulation/Gait Yes    Ambulation/Gait Assistance 5: Supervision    Ambulation/Gait Assistance Details cues for increased R weight shift and step-through pattern     Ambulation Distance (Feet) 90 Feet    Assistive device Rolling walker    Gait Pattern Step-to pattern;Decreased stance time - right;Decreased step length - left;Decreased hip/knee flexion - right;Decreased weight shift to right;Trunk flexed;Step-through pattern    Ambulation Surface Level;Indoor      Knee/Hip Exercises: Aerobic   Nustep Lvl 1, 6 min (LE/UE)      Knee/Hip Exercises: Standing   Other Standing Knee Exercises R/L standing weight shift in the RW with mirror x 10 reps       Knee/Hip  Exercises: Supine   Quad Sets Right;10 reps;Strengthening    Quad Sets Limitations 5" hold     Heel Slides Right;10 reps;AAROM    Heel Slides Limitations heel slide with strap     Knee Flexion AAROM;Left;10 reps    Knee Flexion Limitations peanut p-ball and strap     Other Supine Knee/Hip Exercises Ankle pumps supine x 20      Modalities   Modalities Vasopneumatic      Vasopneumatic   Number Minutes Vasopneumatic  10 minutes    Vasopnuematic Location  Knee   R   Vasopneumatic Pressure Low    Vasopneumatic Temperature  lower temp.                    PT Education - 01/20/20 0929    Education Details HEP update;  heel slide    Person(s) Educated Patient    Methods Explanation;Demonstration;Verbal cues;Handout    Comprehension Verbalized understanding;Returned demonstration;Verbal cues required            PT Short Term Goals - 01/20/20 0854      PT SHORT TERM GOAL #1   Title Pt will be independent with initial HEP    Period Weeks    Status On-going    Target Date 02/08/20             PT Long Term Goals - 01/20/20 0854      PT LONG TERM GOAL #1   Title Pt to be independent with advanced HEP    Time 8    Period Weeks    Status On-going      PT LONG TERM GOAL #2   Title Patient to demonstrate 0-120 degrees of AROM at R knee to improve gait and abilty to perform ADLs independently.    Time 8    Period Weeks    Status On-going      PT LONG TERM GOAL #3   Title Patient to demonstrate B LE strength >/=4+/5.    Time 8    Period Weeks    Status On-going      PT LONG TERM GOAL #4   Title Patient to demonstrate R SLR without quad lag.    Time 8    Period Weeks    Status On-going      PT LONG TERM GOAL #5   Title Pt will demonstrate normal gait pattern with LRAD in order to improve functional mobility and decrease fall risk    Time 8    Period Weeks    Status On-going                 Plan - 01/20/20 0855    Clinical Impression  Statement  Reports she is having a better day than last she was session as she is now not taking codeine.  Pt. progressing toward LTG #2 demonstrating strap assisted R knee flexion to 70 dg today.  Tolerated progression of knee flexion AROM heel slide activities well and HEP updated.  Still having significant difficulty raising R LE to mat table and unable to perform unassisted SLR yet.  Ended visit with ice/compression to R knee to reduce pain and swelling.    Comorbidities HLD, depression, GERD, L TKA with revision and closed manipulation 2018-2020    Rehab Potential Good    PT Treatment/Interventions ADLs/Self Care Home Management;Cryotherapy;Electrical Stimulation;Moist Heat;Balance training;Therapeutic exercise;Therapeutic activities;Functional mobility training;Stair training;Gait training;Ultrasound;Neuromuscular re-education;Patient/family education;Manual techniques;Vasopneumatic Device;Taping;Energy conservation;Dry needling;Passive range of motion;Scar mobilization    PT Next Visit Plan Add heel slides into HEP, progress R knee ROM and strength, gait training with RW    Consulted and Agree with Plan of Care Patient           Patient will benefit from skilled therapeutic intervention in order to improve the following deficits and impairments:  Hypomobility, Decreased endurance, Abnormal gait, Increased edema, Decreased scar mobility, Decreased activity tolerance, Decreased strength, Increased fascial restricitons, Pain, Decreased balance, Difficulty walking, Increased muscle spasms, Improper body mechanics, Decreased range of motion, Impaired flexibility, Postural dysfunction  Visit Diagnosis: Acute pain of right knee  Stiffness of right knee, not elsewhere classified  Muscle weakness (generalized)  Other abnormalities of gait and mobility     Problem List Patient Active Problem List   Diagnosis Date Noted  . Status post right knee replacement 01/16/2020  . Osteoarthritis of right knee  01/13/2020  . Hormone replacement therapy (HRT) 03/03/2018  . S/P left unicompartmental knee replacement 01/13/2018  . Arthrofibrosis of total knee arthroplasty, subsequent encounter 01/21/2017  . Adjustment disorder with mixed anxiety and depressed mood 02/22/2016  . Mixed hyperlipidemia 08/22/2015  . Left knee pain 04/19/2014  . BRONCHITIS, ACUTE 07/21/2010    Bess Harvest, PTA 01/20/20 12:11 PM   [facility] [address]  [place], [place], [address] Phone: [phone]   Fax:  [phone]  Name: Joanna Willis MRN: [id number] Date of Birth: 09-14-1955

## 2020-01-23 ENCOUNTER — Telehealth: Payer: Self-pay | Admitting: Gastroenterology

## 2020-01-23 ENCOUNTER — Other Ambulatory Visit: Payer: Self-pay

## 2020-01-23 ENCOUNTER — Ambulatory Visit: Payer: 59 | Attending: Orthopedic Surgery | Admitting: Physical Therapy

## 2020-01-23 ENCOUNTER — Encounter: Payer: Self-pay | Admitting: Physical Therapy

## 2020-01-23 DIAGNOSIS — M25661 Stiffness of right knee, not elsewhere classified: Secondary | ICD-10-CM | POA: Diagnosis not present

## 2020-01-23 DIAGNOSIS — M25561 Pain in right knee: Secondary | ICD-10-CM | POA: Insufficient documentation

## 2020-01-23 DIAGNOSIS — M6281 Muscle weakness (generalized): Secondary | ICD-10-CM | POA: Insufficient documentation

## 2020-01-23 DIAGNOSIS — R2689 Other abnormalities of gait and mobility: Secondary | ICD-10-CM | POA: Insufficient documentation

## 2020-01-23 NOTE — Telephone Encounter (Signed)
Spoke with the patient. She is passing liquid stool. Feeling much better. Thanks me for the help and the follow up.

## 2020-01-23 NOTE — Telephone Encounter (Signed)
Spoke with this really sweet patient. She has had knee surgery. She knew she was prone to constipation and has been through this before with her last knee replacement.  She has used daily Miralax BID, and Colace BID. As the bowel movements decreased she has added Dulcolax suppositories and mineral oil suppositories. The most recent thing she has tried is half a bottle of Mag Citrate which was this morning.  (she tried that last week as well, but vomited it back up) With the saline enema, she has passed a small amount of hard formed balls of stool. She is passing some gas. Her abdomen is gurgling. 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  Appointment tomorrow. She wants to know if there is anything she should do tonight. Would not like to go to the ED. She is concerned about an intestinal blockage or ileus.

## 2020-01-23 NOTE — Telephone Encounter (Signed)
Please advise her to do bowel purge with MiraLAX and Gatorade to help relieve constipation.  Stay on liquid diet today while she is doing the bowel purge.  If her symptoms worsen or if unable to pass gas, will need to come to ER for further evaluation. Thank you

## 2020-01-23 NOTE — Therapy (Signed)
[facility] [address]  [place], [place], [address] Phone: [phone]   Fax:  [phone]  Physical Therapy Treatment  Patient Details  Name: Joanna Willis MRN: [id number] Date of Birth: 1955/10/29 Referring Provider (PT): Frederik Pear, MD   Encounter Date: 01/23/2020   PT End of Session - 01/23/20 0933    Visit Number 3    Number of Visits 17    Date for PT Re-Evaluation 03/14/20    Authorization Type [facility]    PT Start Time 0808    PT Stop Time 0900    PT Time Calculation (min) 52 min    Activity Tolerance Patient limited by pain;Patient tolerated treatment well    Behavior During Therapy [facility] for tasks assessed/performed           Past Medical History:  Diagnosis Date  . Allergy   . Arthritis   . Depression   . GERD (gastroesophageal reflux disease)   . Hypercholesteremia   . PONV (postoperative nausea and vomiting)   . Postmenopausal   . Wears glasses     Past Surgical History:  Procedure Laterality Date  . ABDOMINAL HYSTERECTOMY  2005   did not remove ovaries  . CESAREAN SECTION  2000  . COLONOSCOPY    . CONVERSION TO TOTAL KNEE Left 01/13/2018   Procedure: Revision of Left knee unicompartmental arthroplasty to total knee arthroplasty;  Surgeon: [name], MD;  Location: [facility];  Service: Orthopedics;  Laterality: Left;  . FOOT ARTHRODESIS  1995   right  . KNEE ARTHROSCOPY WITH MEDIAL MENISECTOMY Left 05/17/2014   Procedure: LEFT ARTHROSCOPY KNEE WITH PARTIAL MEDIAL MENISECTOMY, CHONDROPLASTY OF LATERAL FEMORAL CONDYLE;  Surgeon: Alta Corning, MD;  Location: [place];  Service: Orthopedics;  Laterality: Left;  . KNEE CLOSED REDUCTION Left 07/21/2018   Procedure: CLOSED MANIPULATION KNEE;  Surgeon: [name], MD;  Location: [facility];  Service: Orthopedics;  Laterality: Left;  57min  . PARTIAL KNEE ARTHROPLASTY Left 01/21/2017   Procedure: UNICOMPARTMENTAL LEFT KNEE;   Surgeon: [name], MD;  Location: [facility];  Service: Orthopedics;  Laterality: Left;  . Revision left knee     Dr. Wynelle Link 01-13-18  . TOTAL KNEE ARTHROPLASTY Right 01/16/2020   Procedure: RIGHT TOTAL KNEE ARTHROPLASTY;  Surgeon: Frederik Pear, MD;  Location: [facility];  Service: Orthopedics;  Laterality: Right;    There were no vitals filed for this visit.   Subjective Assessment - 01/23/20 0810    Subjective Having some trouble performing SLRs on her own, having to have her husband help her.    Pertinent History HLD, depression, GERD, L TKA with revision and closed manipulation 2018-2020    Diagnostic tests none recent    Patient Stated Goals get rid of pain    Currently in Pain? Yes    Pain Score 2     Pain Location Knee    Pain Orientation Right    Pain Descriptors / Indicators Aching    Pain Type Acute pain;Surgical pain                             [facility] Adult PT Treatment/Exercise - 01/23/20 0001      Knee/Hip Exercises: Stretches   Quad Stretch Right;2 reps;30 seconds    Quad Stretch Limitations prone with strap    Hip Flexor Stretch Right;1 rep;30 seconds    Hip Flexor Stretch Limitations mod thomas  unable to tolerate     Knee/Hip Exercises: Aerobic   Nustep Lvl 1, 6 min (LE/UE)      Knee/Hip Exercises: Seated   Long Arc Quad AROM;Right;1 set;5 reps    Long Arc Quad Limitations limited ROM, sitting on airex    Hamstring Curl Strengthening;Right;2 sets;5 reps    Hamstring Limitations red TB      Knee/Hip Exercises: Supine   Quad Sets Right;10 reps;Strengthening    Quad Sets Limitations 5x10" with towel roll under knee, 5x10" with towel under ankle    Heel Slides Right;10 reps;AAROM    Heel Slides Limitations heel slide with strap and orang epball    Straight Leg Raises Strengthening;Right;2 sets;5 reps    Straight Leg Raises Limitations PT assist    hip flexor soreness     Vasopneumatic   Number Minutes Vasopneumatic  15 minutes     Vasopnuematic Location  Knee   R   Vasopneumatic Pressure Low    Vasopneumatic Temperature  lower temp.                    PT Education - 01/23/20 0933    Education Details update to HEP    Person(s) Educated Patient    Methods Explanation;Demonstration;Tactile cues;Verbal cues;Handout    Comprehension Verbalized understanding;Returned demonstration            PT Short Term Goals - 01/20/20 0854      PT SHORT TERM GOAL #1   Title Pt will be independent with initial HEP    Period Weeks    Status On-going    Target Date 02/08/20             PT Long Term Goals - 01/20/20 0854      PT LONG TERM GOAL #1   Title Pt to be independent with advanced HEP    Time 8    Period Weeks    Status On-going      PT LONG TERM GOAL #2   Title Patient to demonstrate 0-120 degrees of AROM at R knee to improve gait and abilty to perform ADLs independently.    Time 8    Period Weeks    Status On-going      PT LONG TERM GOAL #3   Title Patient to demonstrate B LE strength >/=4+/5.    Time 8    Period Weeks    Status On-going      PT LONG TERM GOAL #4   Title Patient to demonstrate R SLR without quad lag.    Time 8    Period Weeks    Status On-going      PT LONG TERM GOAL #5   Title Pt will demonstrate normal gait pattern with LRAD in order to improve functional mobility and decrease fall risk    Time 8    Period Weeks    Status On-going                 Plan - 01/23/20 0934    Clinical Impression Statement Patient reporting continued difficulty with quad activation and stability with HEP and functional activities. Notes instances of R knee buckling but denies falls. Patient demonstrating improvement in ease of transfers and mobility throughout session. Assisted patient with SLRs d/t weakness, with patient noting hip flexor soreness towards last reps. Demonstrated LAQ and HS curls with report of more difficulty with quad vs. HS activation. Ended session with  Gameready to R knee for pain relief. No complaints  at end of session. Patient progressing well towards goals.    Comorbidities HLD, depression, GERD, L TKA with revision and closed manipulation 2018-2020    Rehab Potential Good    PT Treatment/Interventions ADLs/Self Care Home Management;Cryotherapy;Electrical Stimulation;Moist Heat;Balance training;Therapeutic exercise;Therapeutic activities;Functional mobility training;Stair training;Gait training;Ultrasound;Neuromuscular re-education;Patient/family education;Manual techniques;Vasopneumatic Device;Taping;Energy conservation;Dry needling;Passive range of motion;Scar mobilization    PT Next Visit Plan progress R knee ROM and strength, gait training with RW    Consulted and Agree with Plan of Care Patient           Patient will benefit from skilled therapeutic intervention in order to improve the following deficits and impairments:  Hypomobility, Decreased endurance, Abnormal gait, Increased edema, Decreased scar mobility, Decreased activity tolerance, Decreased strength, Increased fascial restricitons, Pain, Decreased balance, Difficulty walking, Increased muscle spasms, Improper body mechanics, Decreased range of motion, Impaired flexibility, Postural dysfunction  Visit Diagnosis: Acute pain of right knee  Stiffness of right knee, not elsewhere classified  Muscle weakness (generalized)  Other abnormalities of gait and mobility     Problem List Patient Active Problem List   Diagnosis Date Noted  . Status post right knee replacement 01/16/2020  . Osteoarthritis of right knee 01/13/2020  . Hormone replacement therapy (HRT) 03/03/2018  . S/P left unicompartmental knee replacement 01/13/2018  . Arthrofibrosis of total knee arthroplasty, subsequent encounter 01/21/2017  . Adjustment disorder with mixed anxiety and depressed mood 02/22/2016  . Mixed hyperlipidemia 08/22/2015  . Left knee pain 04/19/2014  . BRONCHITIS, ACUTE 07/21/2010       Janene Harvey, PT, DPT 01/23/20 9:35 AM   [facility] [address]  [place], [place], [address] Phone: [phone]   Fax:  [phone]  Name: Joanna Willis MRN: [id number] Date of Birth: Oct 20, 1955

## 2020-01-23 NOTE — Telephone Encounter (Signed)
Patient called states she is having a blockage and is constipated please advise

## 2020-01-24 ENCOUNTER — Ambulatory Visit: Payer: 59 | Admitting: Nurse Practitioner

## 2020-01-25 ENCOUNTER — Ambulatory Visit: Payer: 59 | Admitting: Physical Therapy

## 2020-01-25 ENCOUNTER — Other Ambulatory Visit: Payer: Self-pay

## 2020-01-25 ENCOUNTER — Encounter: Payer: Self-pay | Admitting: Physical Therapy

## 2020-01-25 DIAGNOSIS — R2689 Other abnormalities of gait and mobility: Secondary | ICD-10-CM

## 2020-01-25 DIAGNOSIS — M6281 Muscle weakness (generalized): Secondary | ICD-10-CM | POA: Diagnosis not present

## 2020-01-25 DIAGNOSIS — M25661 Stiffness of right knee, not elsewhere classified: Secondary | ICD-10-CM

## 2020-01-25 DIAGNOSIS — M25561 Pain in right knee: Secondary | ICD-10-CM

## 2020-01-25 NOTE — Therapy (Signed)
[facility] [address]  [place], [place], [address] Phone: [phone]   Fax:  [phone]  Physical Therapy Treatment  Patient Details  Name: Joanna Willis MRN: [id number] Date of Birth: 03/12/1956 Referring Provider (PT): Frederik Pear, MD   Encounter Date: 01/25/2020   PT End of Session - 01/25/20 1522    Visit Number 4    Number of Visits 17    Date for PT Re-Evaluation 03/14/20    Authorization Type [facility]    PT Start Time 1440    PT Stop Time 1535    PT Time Calculation (min) 55 min    Activity Tolerance Patient limited by pain;Patient tolerated treatment well    Behavior During Therapy [facility] for tasks assessed/performed           Past Medical History:  Diagnosis Date  . Allergy   . Arthritis   . Depression   . GERD (gastroesophageal reflux disease)   . Hypercholesteremia   . PONV (postoperative nausea and vomiting)   . Postmenopausal   . Wears glasses     Past Surgical History:  Procedure Laterality Date  . ABDOMINAL HYSTERECTOMY  2005   did not remove ovaries  . CESAREAN SECTION  2000  . COLONOSCOPY    . CONVERSION TO TOTAL KNEE Left 01/13/2018   Procedure: Revision of Left knee unicompartmental arthroplasty to total knee arthroplasty;  Surgeon: [name], MD;  Location[facility];  Service: Orthopedics;  Laterality: Left;  . FOOT ARTHRODESIS  1995   right  . KNEE ARTHROSCOPY WITH MEDIAL MENISECTOMY Left 05/17/2014   Procedure: LEFT ARTHROSCOPY KNEE WITH PARTIAL MEDIAL MENISECTOMY, CHONDROPLASTY OF LATERAL FEMORAL CONDYLE;  Surgeon: Alta Corning, MD;  Location: [place];  Service: Orthopedics;  Laterality: Left;  . KNEE CLOSED REDUCTION Left 07/21/2018   Procedure: CLOSED MANIPULATION KNEE;  Surgeon: [name], MD;  [facility];  Service: Orthopedics;  Laterality: Left;  16mn  . PARTIAL KNEE ARTHROPLASTY Left 01/21/2017   Procedure: UNICOMPARTMENTAL LEFT KNEE;   Surgeon: AGaynelle Arabian MD;  [facility];  Service: Orthopedics;  Laterality: Left;  . Revision left knee     Dr. AWynelle Link7-24-19  . TOTAL KNEE ARTHROPLASTY Right 01/16/2020   Procedure: RIGHT TOTAL KNEE ARTHROPLASTY;  Surgeon: RFrederik Pear MD;  [facility];  Service: Orthopedics;  Laterality: Right;    There were no vitals filed for this visit.   Subjective Assessment - 01/25/20 1441    Subjective Feels like she was on that leg a lot yesterday- had a lot of nerve pain last night. Took some leftover Gabapentin which eventually helped.    Patient is accompained by: Family member   husband   Pertinent History HLD, depression, GERD, L TKA with revision and closed manipulation 2018-2020    Diagnostic tests none recent    Patient Stated Goals get rid of pain    Currently in Pain? Yes    Pain Score 4     Pain Location Knee    Pain Orientation Right    Pain Descriptors / Indicators Aching    Pain Type Acute pain;Surgical pain              [facility] PT Assessment - 01/25/20 0001      Assessment   Medical Diagnosis s/p R TKA    Referring Provider (PT) FFrederik Pear MD    Onset Date/Surgical Date 01/16/20      AROM  Right Knee Extension 5    Right Knee Flexion 75      PROM   Right Knee Extension 5    Right Knee Flexion 77      Strength   Right Hip Flexion 3+/5    Right Hip ABduction 4/5    Right Hip ADduction 4/5    Left Hip Flexion 4+/5    Left Hip ABduction 4+/5    Left Hip ADduction 4+/5    Right Knee Flexion 3+/5    Right Knee Extension 3/5    Left Knee Flexion 4+/5    Left Knee Extension 4+/5    Right Ankle Dorsiflexion 4/5    Right Ankle Plantar Flexion 4/5    Left Ankle Dorsiflexion 4+/5    Left Ankle Plantar Flexion 4+/5                         [facility] Adult PT Treatment/Exercise - 01/25/20 0001      Ambulation/Gait   Ambulation Distance (Feet) 90 Feet    Assistive device Standard walker;Straight cane    Gait Pattern Step-to  pattern;Decreased stance time - right;Decreased step length - left;Decreased hip/knee flexion - right;Decreased weight shift to right;Trunk flexed;Step-through pattern    Ambulation Surface Level;Indoor    Gait velocity decreased    Gait Comments intermittent R knee buckling with self-correction of LOB- advised patient to continue using 2WW d/t poor quad instability       Exercises   Exercises Knee/Hip      Knee/Hip Exercises: Stretches   Passive Hamstring Stretch Right;2 reps;30 seconds    Passive Hamstring Stretch Limitations supine with strap      Knee/Hip Exercises: Aerobic   Nustep Lvl 2, 6 min (LE/UE)      Knee/Hip Exercises: Standing   Heel Raises Both;2 sets;10 reps    Heel Raises Limitations at TM rail    Knee Flexion Strengthening;Right;1 set;15 reps    Knee Flexion Limitations HS curl 5x with yellow TB, 2x10 with 1#    Functional Squat 1 set;10 reps    Functional Squat Limitations at TM tail; mini squat      Knee/Hip Exercises: Supine   Heel Slides Right;10 reps;AAROM    Heel Slides Limitations 10x3" heel slide with strap and orange pball    Straight Leg Raises Strengthening;Right;2 sets;5 reps    Straight Leg Raises Limitations 1 finger PT assist       Vasopneumatic   Number Minutes Vasopneumatic  15 minutes    Vasopnuematic Location  Knee   R   Vasopneumatic Pressure Low    Vasopneumatic Temperature  lower temp.                    PT Education - 01/25/20 1521    Education Details update to HEP; discussion on objective progress and remaining impairments    Person(s) Educated Patient    Methods Explanation;Demonstration;Tactile cues;Verbal cues;Handout    Comprehension Verbalized understanding;Returned demonstration            PT Short Term Goals - 01/25/20 1524      PT SHORT TERM GOAL #1   Title Pt will be independent with initial HEP    Period Weeks    Status Achieved    Target Date 02/08/20             PT Long Term Goals - 01/25/20  1524      PT LONG TERM GOAL #1   Title Pt  to be independent with advanced HEP    Time 8    Period Weeks    Status Partially Met   met for current     PT LONG TERM GOAL #2   Title Patient to demonstrate 0-120 degrees of AROM at R knee to improve gait and abilty to perform ADLs independently.    Time 8    Period Weeks    Status On-going   01/25/20: 5-75 degrees AROM, 5-77 degrees PROM     PT LONG TERM GOAL #3   Title Patient to demonstrate B LE strength >/=4+/5.    Time 8    Period Weeks    Status On-going   improving- see objective measurements     PT LONG TERM GOAL #4   Title Patient to demonstrate R SLR without quad lag.    Time 8    Period Weeks    Status On-going   still requiring 1 finger assistance with SLR d/t quad weakness     PT LONG TERM GOAL #5   Title Pt will demonstrate normal gait pattern with LRAD in order to improve functional mobility and decrease fall risk    Time 8    Period Weeks    Status On-going   anterior trunk lean, decreased L step length, and lack of R TKE at heel strike with 2WW and [facility]                Plan - 01/25/20 1526    Clinical Impression Statement Patient reporting increased R knee nerve pain last night which she was able to get under control with some left over Gabapentin. Patient today able to demonstrate R knee 5-75 degrees AROM, 5-77 degrees PROM. LE strength testing has also improved considerable in several muscle groups, but with R quad and hip flexor most limiting. Patient requiring 1 finger assistance with SLR d/t quad weakness, which is an improved from previous sessions. Trial gait training with SPC with patient demonstrating intermittent R knee buckling with self-correction of LOB, thus advised patient to continue using 2WW d/t poor quad instability. Patient reported understanding of all edu provided today. Ended session with Gameready to R knee for pain relief. Patient progressing towards goals, with quad weakness and pain levels  as main barriers to progress.    Comorbidities HLD, depression, GERD, L TKA with revision and closed manipulation 2018-2020    Rehab Potential Good    PT Treatment/Interventions ADLs/Self Care Home Management;Cryotherapy;Electrical Stimulation;Moist Heat;Balance training;Therapeutic exercise;Therapeutic activities;Functional mobility training;Stair training;Gait training;Ultrasound;Neuromuscular re-education;Patient/family education;Manual techniques;Vasopneumatic Device;Taping;Energy conservation;Dry needling;Passive range of motion;Scar mobilization    PT Next Visit Plan progress R knee ROM and strength, gait training with RW    Consulted and Agree with Plan of Care Patient           Patient will benefit from skilled therapeutic intervention in order to improve the following deficits and impairments:  Hypomobility, Decreased endurance, Abnormal gait, Increased edema, Decreased scar mobility, Decreased activity tolerance, Decreased strength, Increased fascial restricitons, Pain, Decreased balance, Difficulty walking, Increased muscle spasms, Improper body mechanics, Decreased range of motion, Impaired flexibility, Postural dysfunction  Visit Diagnosis: Acute pain of right knee  Stiffness of right knee, not elsewhere classified  Muscle weakness (generalized)  Other abnormalities of gait and mobility     Problem List Patient Active Problem List   Diagnosis Date Noted  . Status post right knee replacement 01/16/2020  . Osteoarthritis of right knee 01/13/2020  . Hormone replacement therapy (HRT) 03/03/2018  .  S/P left unicompartmental knee replacement 01/13/2018  . Arthrofibrosis of total knee arthroplasty, subsequent encounter 01/21/2017  . Adjustment disorder with mixed anxiety and depressed mood 02/22/2016  . Mixed hyperlipidemia 08/22/2015  . Left knee pain 04/19/2014  . BRONCHITIS, ACUTE 07/21/2010     Joanna Willis, PT, DPT 01/25/20 3:38 PM   [facility] [address]  [place], [place], [address] Phone: [phone]   Fax:  [phone]  Name: Joanna Willis MRN: [id number] Date of Birth: 08-11-1955

## 2020-01-26 DIAGNOSIS — Z471 Aftercare following joint replacement surgery: Secondary | ICD-10-CM | POA: Diagnosis not present

## 2020-01-26 DIAGNOSIS — Z96651 Presence of right artificial knee joint: Secondary | ICD-10-CM | POA: Diagnosis not present

## 2020-01-26 MED FILL — GABAPENTIN 300 MG CAPSULE: 300 | 30 days supply | Qty: 90 | Fill #0

## 2020-01-30 ENCOUNTER — Ambulatory Visit: Payer: 59

## 2020-01-30 ENCOUNTER — Other Ambulatory Visit: Payer: Self-pay

## 2020-01-30 DIAGNOSIS — R2689 Other abnormalities of gait and mobility: Secondary | ICD-10-CM | POA: Diagnosis not present

## 2020-01-30 DIAGNOSIS — M25561 Pain in right knee: Secondary | ICD-10-CM

## 2020-01-30 DIAGNOSIS — M25661 Stiffness of right knee, not elsewhere classified: Secondary | ICD-10-CM | POA: Diagnosis not present

## 2020-01-30 DIAGNOSIS — M6281 Muscle weakness (generalized): Secondary | ICD-10-CM

## 2020-01-30 NOTE — Therapy (Signed)
[facility] [address]  [place], [place], [address] Phone: [phone]   Fax:  [phone]  Physical Therapy Treatment  Patient Details  Name: Joanna Willis MRN: [id number] Date of Birth: [birth year] Referring Provider (PT): Frederik Pear, MD   Encounter Date: 01/30/2020   PT End of Session - 01/30/20 0811    Visit Number 5    Number of Visits 17    Date for PT Re-Evaluation 03/14/20    Authorization Type [facility]    PT Start Time 0805    PT Stop Time 3295    PT Time Calculation (min) 50 min    Activity Tolerance Patient limited by pain;Patient tolerated treatment well    Behavior During Therapy [facility] for tasks assessed/performed           Past Medical History:  Diagnosis Date   Allergy    Arthritis    Depression    GERD (gastroesophageal reflux disease)    Hypercholesteremia    PONV (postoperative nausea and vomiting)    Postmenopausal    Wears glasses     Past Surgical History:  Procedure Laterality Date   ABDOMINAL HYSTERECTOMY  2005   did not remove ovaries   CESAREAN SECTION  2000   COLONOSCOPY     CONVERSION TO TOTAL KNEE Left 01/13/2018   Procedure: Revision of Left knee unicompartmental arthroplasty to total knee arthroplasty;  Surgeon: [name], MD;  Location: [facility];  Service: Orthopedics;  Laterality: Left;   FOOT ARTHRODESIS  1995   right   KNEE ARTHROSCOPY WITH MEDIAL MENISECTOMY Left 05/17/2014   Procedure: LEFT ARTHROSCOPY KNEE WITH PARTIAL MEDIAL MENISECTOMY, CHONDROPLASTY OF LATERAL FEMORAL CONDYLE;  Surgeon: Alta Corning, MD;  Location: [place] [facility];  Service: Orthopedics;  Laterality: Left;   KNEE CLOSED REDUCTION Left 07/21/2018   Procedure: CLOSED MANIPULATION KNEE;  Surgeon: [name], MD;  Location: [facility];  Service: Orthopedics;  Laterality: Left;  59mn   PARTIAL KNEE ARTHROPLASTY Left 01/21/2017   Procedure: UNICOMPARTMENTAL LEFT KNEE;   Surgeon: AGaynelle Arabian MD;  Location: [facility];  Service: Orthopedics;  Laterality: Left;   Revision left knee     Dr. AWynelle [id number]   TOTAL KNEE ARTHROPLASTY Right 01/16/2020   Procedure: RIGHT TOTAL KNEE ARTHROPLASTY;  Surgeon: RFrederik Pear MD;  Location: [facility];  Service: Orthopedics;  Laterality: Right;    There were no vitals filed for this visit.   Subjective Assessment - 01/30/20 0810    Subjective Doing better.  Walking with SPC over last three days.    Pertinent History HLD, depression, GERD, L TKA with revision and closed manipulation 2018-2020    Diagnostic tests none recent    Patient Stated Goals get rid of pain    Currently in Pain? Yes    Pain Score 4     Pain Location Knee    Pain Orientation Right    Pain Descriptors / Indicators Aching    Pain Type Acute pain;Surgical pain              [facility] PT Assessment - 01/30/20 0001      Assessment   Medical Diagnosis s/p R TKA    Referring Provider (PT) FFrederik Pear MD    Onset Date/Surgical Date 01/16/20    Hand Dominance Right    Next MD Visit 02/28/20    Prior Therapy yes- L TKA  [place] Adult PT Treatment/Exercise - 01/30/20 0001      Ambulation/Gait   Ambulation/Gait Yes    Ambulation/Gait Assistance 5: Supervision    Ambulation/Gait Assistance Details Cues for even step-length and upright posture with R TKE with SPC    Ambulation Distance (Feet) 90 Feet    Assistive device Straight cane      Knee/Hip Exercises: Stretches   Hip Flexor Stretch Right;30 seconds;2 reps    Hip Flexor Stretch Limitations mod thomas +     Knee: Self-Stretch Limitations 5" x 10 standing knee flexion stretch on 9" stool at Electrical engineer Right;1 rep    Gastroc Stretch Limitations seated with strap       Knee/Hip Exercises: Aerobic   Nustep Lvl 2, 7 min (LE/UE)      Knee/Hip Exercises: Standing   Heel Raises Both;15 reps    Heel Raises Limitations at counter + quad set        Knee/Hip Exercises: Seated   Long Arc Quad Right;10 reps;2 sets;Strengthening    Long Arc Quad Limitations Improved TKE      Knee/Hip Exercises: Supine   Straight Leg Raises Right   x 7 reps    Straight Leg Raises Limitations no assistance required; minimal quad lag     Knee Flexion AAROM;Left;10 reps    Knee Flexion Limitations peanut p-ball and strap       Vasopneumatic   Number Minutes Vasopneumatic  10 minutes    Vasopnuematic Location  Knee   R   Vasopneumatic Pressure Low    Vasopneumatic Temperature  lower temp.                    PT Education - 01/30/20 1337    Education Details HEP review: hip flexor mod thos stretch, standing knee flexion stretch    Person(s) Educated Patient    Methods Explanation;Demonstration;Verbal cues;Handout    Comprehension Verbalized understanding;Returned demonstration;Verbal cues required            PT Short Term Goals - 01/25/20 1524      PT SHORT TERM GOAL #1   Title Pt will be independent with initial HEP    Period Weeks    Status Achieved    Target Date 02/08/20             PT Long Term Goals - 01/25/20 1524      PT LONG TERM GOAL #1   Title Pt to be independent with advanced HEP    Time 8    Period Weeks    Status Partially Met   met for current     PT LONG TERM GOAL #2   Title Patient to demonstrate 0-120 degrees of AROM at R knee to improve gait and abilty to perform ADLs independently.    Time 8    Period Weeks    Status On-going   01/25/20: 5-75 degrees AROM, 5-77 degrees PROM     PT LONG TERM GOAL #3   Title Patient to demonstrate B LE strength >/=4+/5.    Time 8    Period Weeks    Status On-going   improving- see objective measurements     PT LONG TERM GOAL #4   Title Patient to demonstrate R SLR without quad lag.    Time 8    Period Weeks    Status On-going   still requiring 1 finger assistance with SLR d/t quad weakness     PT LONG TERM  GOAL #5   Title Pt will demonstrate normal gait  pattern with LRAD in order to improve functional mobility and decrease fall risk    Time 8    Period Weeks    Status On-going   anterior trunk lean, decreased L step length, and lack of R TKE at heel strike with 2WW and [facility]                Plan - 01/30/20 0835    Clinical Impression Statement Jaylyne reporting improved functional mobility and pain levels over this past week.  Started ambulating at home with [facility] and did gait train with [facility] today for improved R heel strike and upright posture.  Duration of session focused on R knee flexion/extension ROM activities with pt. making visible progress with R knee AROM flexion.  Updated HEP.  Ended visit with ice/compression to R knee to address swelling and pain.    Comorbidities HLD, depression, GERD, L TKA with revision and closed manipulation 2018-2020    Rehab Potential Good    PT Frequency 2x / week    PT Duration 8 weeks    PT Treatment/Interventions ADLs/Self Care Home Management;Cryotherapy;Electrical Stimulation;Moist Heat;Balance training;Therapeutic exercise;Therapeutic activities;Functional mobility training;Stair training;Gait training;Ultrasound;Neuromuscular re-education;Patient/family education;Manual techniques;Vasopneumatic Device;Taping;Energy conservation;Dry needling;Passive range of motion;Scar mobilization    PT Next Visit Plan progress R knee ROM and strength, gait training with SPC    Consulted and Agree with Plan of Care Patient           Patient will benefit from skilled therapeutic intervention in order to improve the following deficits and impairments:  Hypomobility, Decreased endurance, Abnormal gait, Increased edema, Decreased scar mobility, Decreased activity tolerance, Decreased strength, Increased fascial restricitons, Pain, Decreased balance, Difficulty walking, Increased muscle spasms, Improper body mechanics, Decreased range of motion, Impaired flexibility, Postural dysfunction  Visit Diagnosis: Acute pain of  right knee  Stiffness of right knee, not elsewhere classified  Muscle weakness (generalized)  Other abnormalities of gait and mobility     Problem List Patient Active Problem List   Diagnosis Date Noted   Status post right knee replacement 01/16/2020   Osteoarthritis of right knee 01/13/2020   Hormone replacement therapy (HRT) 03/03/2018   S/P left unicompartmental knee replacement 01/13/2018   Arthrofibrosis of total knee arthroplasty, subsequent encounter 01/21/2017   Adjustment disorder with mixed anxiety and depressed mood 02/22/2016   Mixed hyperlipidemia 08/22/2015   Left knee pain 04/19/2014   BRONCHITIS, ACUTE 07/21/2010    Bess Harvest, PTA 01/30/20 1:45 PM   [facility] [address]  [place], [place], [address] Phone: [phone]   Fax:  [phone]  Name: CHARLENE DETTER MRN: [id number] Date of Birth: [birth year]

## 2020-02-02 ENCOUNTER — Ambulatory Visit: Payer: 59 | Admitting: Physical Therapy

## 2020-02-02 ENCOUNTER — Encounter: Payer: Self-pay | Admitting: Physical Therapy

## 2020-02-02 ENCOUNTER — Other Ambulatory Visit: Payer: Self-pay

## 2020-02-02 DIAGNOSIS — M6281 Muscle weakness (generalized): Secondary | ICD-10-CM

## 2020-02-02 DIAGNOSIS — M25561 Pain in right knee: Secondary | ICD-10-CM

## 2020-02-02 DIAGNOSIS — M25661 Stiffness of right knee, not elsewhere classified: Secondary | ICD-10-CM

## 2020-02-02 DIAGNOSIS — R2689 Other abnormalities of gait and mobility: Secondary | ICD-10-CM | POA: Diagnosis not present

## 2020-02-02 NOTE — Therapy (Signed)
[facility] [address]  [place], [place], [address] Phone: [phone]   Fax:  [phone]  Physical Therapy Treatment  Patient Details  Name: Joanna Willis MRN: [id number] Date of Birth: 1956-03-23 Referring Provider (PT): Frederik Pear, MD   Encounter Date: 02/02/2020   PT End of Session - 02/02/20 1034    Visit Number 6    Number of Visits 17    Date for PT Re-Evaluation 03/14/20    Authorization Type [facility]    PT Start Time 1275    PT Stop Time 0856    PT Time Calculation (min) 61 min    Activity Tolerance Patient limited by pain;Patient tolerated treatment well    Behavior During Therapy [facility] for tasks assessed/performed           Past Medical History:  Diagnosis Date  . Allergy   . Arthritis   . Depression   . GERD (gastroesophageal reflux disease)   . Hypercholesteremia   . PONV (postoperative nausea and vomiting)   . Postmenopausal   . Wears glasses     Past Surgical History:  Procedure Laterality Date  . ABDOMINAL HYSTERECTOMY  2005   did not remove ovaries  . CESAREAN SECTION  2000  . COLONOSCOPY    . CONVERSION TO TOTAL KNEE Left 01/13/2018   Procedure: Revision of Left knee unicompartmental arthroplasty to total knee arthroplasty;  Surgeon: [name], MD;  [facility]: [facility];  Service: Orthopedics;  Laterality: Left;  . FOOT ARTHRODESIS  1995   right  . KNEE ARTHROSCOPY WITH MEDIAL MENISECTOMY Left 05/17/2014   Procedure: LEFT ARTHROSCOPY KNEE WITH PARTIAL MEDIAL MENISECTOMY, CHONDROPLASTY OF LATERAL FEMORAL CONDYLE;  Surgeon: Alta Corning, MD;  Location: [facility];  Service: Orthopedics;  Laterality: Left;  . KNEE CLOSED REDUCTION Left 07/21/2018   Procedure: CLOSED MANIPULATION KNEE;  Surgeon: [name], MD;  Location: [facility];  Service: Orthopedics;  Laterality: Left;  20mn  . PARTIAL KNEE ARTHROPLASTY Left 01/21/2017   Procedure: UNICOMPARTMENTAL LEFT KNEE;   Surgeon: AGaynelle Arabian MD;  [facility];  Service: Orthopedics;  Laterality: Left;  . Revision left knee     Dr. AWynelle [id number]  . TOTAL KNEE ARTHROPLASTY Right 01/16/2020   Procedure: RIGHT TOTAL KNEE ARTHROPLASTY;  Surgeon: RFrederik Pear MD;  [facility];  Service: Orthopedics;  Laterality: Right;    There were no vitals filed for this visit.   Subjective Assessment - 02/02/20 0756    Subjective Had trouble sleeping d/t medial knee pain after performing some standing exercises during the day.    Pertinent History HLD, depression, GERD, L TKA with revision and closed manipulation 2018-2020    Diagnostic tests none recent    Patient Stated Goals get rid of pain    Currently in Pain? Yes    Pain Score 5     Pain Location Knee    Pain Orientation Right;Medial    Pain Descriptors / Indicators Aching    Pain Type Acute pain;Surgical pain                             [facility] Adult PT Treatment/Exercise - 02/02/20 0001      Neuro Re-ed    Neuro Re-ed Details  alternating foot tap on 9" step with intermittent 1 UE support on TM rail   good effort, no compensations at hip     Knee/Hip Exercises:  Stretches   Active Hamstring Stretch Right;1 rep;30 seconds    Active Hamstring Stretch Limitations sitting      Knee/Hip Exercises: Aerobic   Nustep L2 x 2 minutes LEs only, L1 x 4 minutes (UEs/LEs)      Knee/Hip Exercises: Standing   Terminal Knee Extension Strengthening;Right;1 set;10 reps;Theraband    Theraband Level (Terminal Knee Extension) Level 2 (Red)    Terminal Knee Extension Limitations 5x3"    Forward Step Up Right;1 set;10 reps;Hand Hold: 1;Step Height: 4"    Forward Step Up Limitations 5x R up, R down, 5x R up, L down   cues to "unlock" R knee upon step back   Functional Squat 1 set;10 reps    Functional Squat Limitations at TM tail; mini squat   cues to shift R     Knee/Hip Exercises: Supine   Heel Slides Right;10 reps;AAROM    Heel Slides  Limitations 10x3" heel slide with strap and peanut ball    Straight Leg Raises Right;1 set;10 reps    Straight Leg Raises Limitations cues for quad set before each rep    Straight Leg Raise with External Rotation Strengthening;Right;1 set;5 reps    Straight Leg Raise with External Rotation Limitations cues for improved eccentric lower      Vasopneumatic   Number Minutes Vasopneumatic  15 minutes    Vasopnuematic Location  Knee   R   Vasopneumatic Pressure Low    Vasopneumatic Temperature  lower temp.        Manual Therapy   Manual Therapy Joint mobilization    Manual therapy comments supine    Joint Mobilization R patellar mobs M/L and superior/inferior directions grade 3 to tolerance; instruction on self-mobs                  PT Education - 02/02/20 1034    Education Details update to HEP, consolidation of HEP    Person(s) Educated Patient    Methods Explanation;Demonstration;Tactile cues;Verbal cues;Handout    Comprehension Verbalized understanding;Returned demonstration            PT Short Term Goals - 01/25/20 1524      PT SHORT TERM GOAL #1   Title Pt will be independent with initial HEP    Period Weeks    Status Achieved    Target Date 02/08/20             PT Long Term Goals - 01/25/20 1524      PT LONG TERM GOAL #1   Title Pt to be independent with advanced HEP    Time 8    Period Weeks    Status Partially Met   met for current     PT LONG TERM GOAL #2   Title Patient to demonstrate 0-120 degrees of AROM at R knee to improve gait and abilty to perform ADLs independently.    Time 8    Period Weeks    Status On-going   01/25/20: 5-75 degrees AROM, 5-77 degrees PROM     PT LONG TERM GOAL #3   Title Patient to demonstrate B LE strength >/=4+/5.    Time 8    Period Weeks    Status On-going   improving- see objective measurements     PT LONG TERM GOAL #4   Title Patient to demonstrate R SLR without quad lag.    Time 8    Period Weeks     Status On-going   still requiring 1 finger assistance with  SLR d/t quad weakness     PT LONG TERM GOAL #5   Title Pt will demonstrate normal gait pattern with LRAD in order to improve functional mobility and decrease fall risk    Time 8    Period Weeks    Status On-going   anterior trunk lean, decreased L step length, and lack of R TKE at heel strike with 2WW and [facility]                Plan - 02/02/20 1035    Clinical Impression Statement Patient noting trouble sleeping last night d/t R medial knee pain after performing some standing exercises during the day. Patient requesting consolidation of HEP, thus reviewed and omitted a few "easier" exercises. Initiated patellar mobilizations with patient demonstrating mild-moderate hypomobility in all directions. Instructed patient on self-mobs- patient reported understanding. Patient demonstrated much improved ability to perform SLRs today, now no longer requiring assistance; minor quad lag still evident. Initiated short step ups with patient requiring cueing to "unlock" R knee upon stepping back d/t hesitancy. Patient reported quad fatigue after standing ther-ex. Ended session with [place] to R knee. No complaints at end of session. Patient tolerated increased challenge with ther-ex today and progressing well towards goals.    Comorbidities HLD, depression, GERD, L TKA with revision and closed manipulation 2018-2020    Rehab Potential Good    PT Frequency 2x / week    PT Duration 8 weeks    PT Treatment/Interventions ADLs/Self Care Home Management;Cryotherapy;Electrical Stimulation;Moist Heat;Balance training;Therapeutic exercise;Therapeutic activities;Functional mobility training;Stair training;Gait training;Ultrasound;Neuromuscular re-education;Patient/family education;Manual techniques;Vasopneumatic Device;Taping;Energy conservation;Dry needling;Passive range of motion;Scar mobilization    PT Next Visit Plan progress R knee ROM and strength, gait  training with SPC    Consulted and Agree with Plan of Care Patient           Patient will benefit from skilled therapeutic intervention in order to improve the following deficits and impairments:  Hypomobility, Decreased endurance, Abnormal gait, Increased edema, Decreased scar mobility, Decreased activity tolerance, Decreased strength, Increased fascial restricitons, Pain, Decreased balance, Difficulty walking, Increased muscle spasms, Improper body mechanics, Decreased range of motion, Impaired flexibility, Postural dysfunction  Visit Diagnosis: Acute pain of right knee  Stiffness of right knee, not elsewhere classified  Muscle weakness (generalized)  Other abnormalities of gait and mobility     Problem List Patient Active Problem List   Diagnosis Date Noted  . Status post right knee replacement 01/16/2020  . Osteoarthritis of right knee 01/13/2020  . Hormone replacement therapy (HRT) 03/03/2018  . S/P left unicompartmental knee replacement 01/13/2018  . Arthrofibrosis of total knee arthroplasty, subsequent encounter 01/21/2017  . Adjustment disorder with mixed anxiety and depressed mood 02/22/2016  . Mixed hyperlipidemia 08/22/2015  . Left knee pain 04/19/2014  . BRONCHITIS, ACUTE 07/21/2010     Janene Harvey, PT, DPT 02/02/20 10:37 AM   [facility] [address]  [place], [place], [address] Phone: [phone]   Fax:  [phone]  Name: Joanna Willis MRN: [id number] Date of Birth: 1956/05/02

## 2020-02-06 ENCOUNTER — Other Ambulatory Visit: Payer: Self-pay

## 2020-02-06 ENCOUNTER — Ambulatory Visit: Payer: 59

## 2020-02-06 DIAGNOSIS — R2689 Other abnormalities of gait and mobility: Secondary | ICD-10-CM | POA: Diagnosis not present

## 2020-02-06 DIAGNOSIS — M25561 Pain in right knee: Secondary | ICD-10-CM

## 2020-02-06 DIAGNOSIS — M6281 Muscle weakness (generalized): Secondary | ICD-10-CM | POA: Diagnosis not present

## 2020-02-06 DIAGNOSIS — M25661 Stiffness of right knee, not elsewhere classified: Secondary | ICD-10-CM

## 2020-02-06 MED FILL — OMEPRAZOLE 20 MG CAP: 20 | 90 days supply | Qty: 90 | Fill #3

## 2020-02-06 MED FILL — ATORVASTATIN CALCIUM 20 MG: 20 | 90 days supply | Qty: 90 | Fill #1

## 2020-02-06 NOTE — Therapy (Signed)
[facility] [address]  [place], [place], [address] Phone: [phone]   Fax:  [phone]  Physical Therapy Treatment  Patient Details  Name: Joanna Willis MRN: [id number] Date of Birth: April 24, 1956 Referring Provider (PT): Frederik Pear, MD   Encounter Date: 02/06/2020   PT End of Session - 02/06/20 0803    Visit Number 7    Number of Visits 17    Date for PT Re-Evaluation 03/14/20    Authorization Type [facility]    PT Start Time 0800    PT Stop Time 4742    PT Time Calculation (min) 55 min    Activity Tolerance Patient limited by pain;Patient tolerated treatment well    Behavior During Therapy [facility] for tasks assessed/performed           Past Medical History:  Diagnosis Date  . Allergy   . Arthritis   . Depression   . GERD (gastroesophageal reflux disease)   . Hypercholesteremia   . PONV (postoperative nausea and vomiting)   . Postmenopausal   . Wears glasses     Past Surgical History:  Procedure Laterality Date  . ABDOMINAL HYSTERECTOMY  2005   did not remove ovaries  . CESAREAN SECTION  2000  . COLONOSCOPY    . CONVERSION TO TOTAL KNEE Left 01/13/2018   Procedure: Revision of Left knee unicompartmental arthroplasty to total knee arthroplasty;  Surgeon: [name], MD;  Location: [facility];  Service: Orthopedics;  Laterality: Left;  . FOOT ARTHRODESIS  1995   right  . KNEE ARTHROSCOPY WITH MEDIAL MENISECTOMY Left 05/17/2014   Procedure: LEFT ARTHROSCOPY KNEE WITH PARTIAL MEDIAL MENISECTOMY, CHONDROPLASTY OF LATERAL FEMORAL CONDYLE;  Surgeon: Alta Corning, MD;  Location: [place];  Service: Orthopedics;  Laterality: Left;  . KNEE CLOSED REDUCTION Left 07/21/2018   Procedure: CLOSED MANIPULATION KNEE;  Surgeon: [name], MD;  Location: [facility];  Service: Orthopedics;  Laterality: Left;  43mn  . PARTIAL KNEE ARTHROPLASTY Left 01/21/2017   Procedure: UNICOMPARTMENTAL LEFT KNEE;   Surgeon: AGaynelle Arabian MD;  Location: [facility];  Service: Orthopedics;  Laterality: Left;  . Revision left knee     Dr. AWynelle [id number]  . TOTAL KNEE ARTHROPLASTY Right 01/16/2020   Procedure: RIGHT TOTAL KNEE ARTHROPLASTY;  Surgeon: RFrederik Pear MD;  Location: [facility];  Service: Orthopedics;  Laterality: Right;    There were no vitals filed for this visit.   Subjective Assessment - 02/06/20 0802    Subjective Had increased pain for duration of day after last session however this subsided next day.    Pertinent History HLD, depression, GERD, L TKA with revision and closed manipulation 2018-2020    Diagnostic tests none recent    Patient Stated Goals get rid of pain    Currently in Pain? Yes    Pain Score 4     Pain Location Knee    Pain Orientation Right;Medial    Pain Descriptors / Indicators Aching    Pain Type Acute pain;Surgical pain    Multiple Pain Sites No              [facility] PT Assessment - 02/06/20 0001      AROM   AROM Assessment Site Knee    Right/Left Knee Right    Right Knee Extension 5    Right Knee Flexion 88      PROM   PROM Assessment Site Knee    Right/Left Knee Right  Right Knee Extension 4    Right Knee Flexion 93                         [facility] Adult PT Treatment/Exercise - 02/06/20 0001      Knee/Hip Exercises: Stretches   Passive Hamstring Stretch Right;1 rep;30 seconds    Passive Hamstring Stretch Limitations supine with strap    Hip Flexor Stretch Right;30 seconds;1 rep    Hip Flexor Stretch Limitations mod thomas + with strap     Knee: Self-Stretch Limitations 5" x 10 standing knee flexion stretch on 9" stool at counter    Progressed to ~ 90 dg    ITB Stretch Right;1 rep;30 seconds    ITB Stretch Limitations supine with strap     Piriformis Stretch Right;1 rep;30 seconds    Piriformis Stretch Limitations KTOS    Gastroc Stretch Right;2 reps;30 seconds    Gastroc Stretch Limitations into counter       Knee/Hip Exercises:  Aerobic   Recumbent Bike Lvl 1, 6 min - partial recolutions       Knee/Hip Exercises: Standing   Hip Flexion Right;Left;10 reps;Knee bent;Stengthening    Hip Flexion Limitations 8" step     Functional Squat 1 set;10 reps    Functional Squat Limitations counter       Vasopneumatic   Number Minutes Vasopneumatic  10 minutes    Vasopnuematic Location  Knee   R   Vasopneumatic Pressure Low    Vasopneumatic Temperature  lower temp.        Manual Therapy   Manual Therapy Joint mobilization    Manual therapy comments supine    Joint Mobilization R patellar mobs M/L and superior/inferior directions grade 3 to tolerance; instruction on self-mobs                  PT Education - 02/06/20 1148    Education Details HEP update    Person(s) Educated Patient    Methods Explanation;Demonstration;Verbal cues;Handout    Comprehension Verbalized understanding;Returned demonstration;Verbal cues required            PT Short Term Goals - 01/25/20 1524      PT SHORT TERM GOAL #1   Title Pt will be independent with initial HEP    Period Weeks    Status Achieved    Target Date 02/08/20             PT Long Term Goals - 01/25/20 1524      PT LONG TERM GOAL #1   Title Pt to be independent with advanced HEP    Time 8    Period Weeks    Status Partially Met   met for current     PT LONG TERM GOAL #2   Title Patient to demonstrate 0-120 degrees of AROM at R knee to improve gait and abilty to perform ADLs independently.    Time 8    Period Weeks    Status On-going   01/25/20: 5-75 degrees AROM, 5-77 degrees PROM     PT LONG TERM GOAL #3   Title Patient to demonstrate B LE strength >/=4+/5.    Time 8    Period Weeks    Status On-going   improving- see objective measurements     PT LONG TERM GOAL #4   Title Patient to demonstrate R SLR without quad lag.    Time 8    Period Weeks    Status On-going  still requiring 1 finger assistance with SLR d/t quad weakness     PT LONG  TERM GOAL #5   Title Pt will demonstrate normal gait pattern with LRAD in order to improve functional mobility and decrease fall risk    Time 8    Period Weeks    Status On-going   anterior trunk lean, decreased L step length, and lack of R TKE at heel strike with 2WW and [facility]                Plan - 02/06/20 0806    Clinical Impression Statement Pt. demonstrating improved R knee ROM.  R knee AROM 5-88 dg, PROM 4-93 dg.  Progressing toward LTG #2.  Progressed quad stretching and knee flexion ROM activities with good tolerance.  Ended visit with ice/compression to R knee to reduce post-exercise swelling and pain.    Comorbidities HLD, depression, GERD, L TKA with revision and closed manipulation 2018-2020    Rehab Potential Good    PT Treatment/Interventions ADLs/Self Care Home Management;Cryotherapy;Electrical Stimulation;Moist Heat;Balance training;Therapeutic exercise;Therapeutic activities;Functional mobility training;Stair training;Gait training;Ultrasound;Neuromuscular re-education;Patient/family education;Manual techniques;Vasopneumatic Device;Taping;Energy conservation;Dry needling;Passive range of motion;Scar mobilization    PT Next Visit Plan progress R knee ROM and strength, gait training with SPC    Consulted and Agree with Plan of Care Patient           Patient will benefit from skilled therapeutic intervention in order to improve the following deficits and impairments:  Hypomobility, Decreased endurance, Abnormal gait, Increased edema, Decreased scar mobility, Decreased activity tolerance, Decreased strength, Increased fascial restricitons, Pain, Decreased balance, Difficulty walking, Increased muscle spasms, Improper body mechanics, Decreased range of motion, Impaired flexibility, Postural dysfunction  Visit Diagnosis: Acute pain of right knee  Stiffness of right knee, not elsewhere classified  Muscle weakness (generalized)  Other abnormalities of gait and  mobility     Problem List Patient Active Problem List   Diagnosis Date Noted  . Status post right knee replacement 01/16/2020  . Osteoarthritis of right knee 01/13/2020  . Hormone replacement therapy (HRT) 03/03/2018  . S/P left unicompartmental knee replacement 01/13/2018  . Arthrofibrosis of total knee arthroplasty, subsequent encounter 01/21/2017  . Adjustment disorder with mixed anxiety and depressed mood 02/22/2016  . Mixed hyperlipidemia 08/22/2015  . Left knee pain 04/19/2014  . BRONCHITIS, ACUTE 07/21/2010    Bess Harvest, PTA 02/06/20 11:50 AM    [facility] [address]  [place], [place], [address] Phone: [phone]   Fax:  [phone]  Name: Joanna Willis MRN: [id number] Date of Birth: 01-18-1956

## 2020-02-09 ENCOUNTER — Encounter: Payer: Self-pay | Admitting: Physical Therapy

## 2020-02-09 ENCOUNTER — Other Ambulatory Visit: Payer: Self-pay

## 2020-02-09 ENCOUNTER — Ambulatory Visit: Payer: 59 | Admitting: Physical Therapy

## 2020-02-09 DIAGNOSIS — R2689 Other abnormalities of gait and mobility: Secondary | ICD-10-CM

## 2020-02-09 DIAGNOSIS — M25661 Stiffness of right knee, not elsewhere classified: Secondary | ICD-10-CM

## 2020-02-09 DIAGNOSIS — M25561 Pain in right knee: Secondary | ICD-10-CM | POA: Diagnosis not present

## 2020-02-09 DIAGNOSIS — M6281 Muscle weakness (generalized): Secondary | ICD-10-CM | POA: Diagnosis not present

## 2020-02-09 NOTE — Therapy (Signed)
[facility] [address]  [place], [place], [address] Phone: [phone]   Fax:  [phone]  Physical Therapy Treatment  Patient Details  Name: Joanna Willis MRN: [id number] Date of Birth: 1955-10-21 Referring Provider (PT): Frederik Pear, MD   Encounter Date: 02/09/2020   PT End of Session - 02/09/20 0838    Visit Number 8    Number of Visits 17    Date for PT Re-Evaluation 03/14/20    Authorization Type [facility]    PT Start Time 0756    PT Stop Time 0852    PT Time Calculation (min) 56 min    Activity Tolerance Patient limited by pain;Patient tolerated treatment well    Behavior During Therapy [facility] for tasks assessed/performed           Past Medical History:  Diagnosis Date  . Allergy   . Arthritis   . Depression   . GERD (gastroesophageal reflux disease)   . Hypercholesteremia   . PONV (postoperative nausea and vomiting)   . Postmenopausal   . Wears glasses     Past Surgical History:  Procedure Laterality Date  . ABDOMINAL HYSTERECTOMY  2005   did not remove ovaries  . CESAREAN SECTION  2000  . COLONOSCOPY    . CONVERSION TO TOTAL KNEE Left 01/13/2018   Procedure: Revision of Left knee unicompartmental arthroplasty to total knee arthroplasty;  Surgeon: [name], MD;  Location: [facility];  Service: Orthopedics;  Laterality: Left;  . FOOT ARTHRODESIS  1995   right  . KNEE ARTHROSCOPY WITH MEDIAL MENISECTOMY Left 05/17/2014   Procedure: LEFT ARTHROSCOPY KNEE WITH PARTIAL MEDIAL MENISECTOMY, CHONDROPLASTY OF LATERAL FEMORAL CONDYLE;  Surgeon: Alta Corning, MD;  Location: [place];  Service: Orthopedics;  Laterality: Left;  . KNEE CLOSED REDUCTION Left 07/21/2018   Procedure: CLOSED MANIPULATION KNEE;  Surgeon: [name], MD;  Location: [facility];  Service: Orthopedics;  Laterality: Left;  56mn  . PARTIAL KNEE ARTHROPLASTY Left 01/21/2017   Procedure: UNICOMPARTMENTAL LEFT KNEE;   Surgeon: AGaynelle Arabian MD;  [facility];  Service: Orthopedics;  Laterality: Left;  . Revision left knee     Dr. AWynelle [id number]  . TOTAL KNEE ARTHROPLASTY Right 01/16/2020   Procedure: RIGHT TOTAL KNEE ARTHROPLASTY;  Surgeon: RFrederik Pear MD;  [facility];  Service: Orthopedics;  Laterality: Right;    There were no vitals filed for this visit.   Subjective Assessment - 02/09/20 0758    Subjective Requesting not to warm up on the bike today d/t not getting a good stretch from it. Still having trouble sleeping.    Pertinent History HLD, depression, GERD, L TKA with revision and closed manipulation 2018-2020    Diagnostic tests none recent    Patient Stated Goals get rid of pain    Currently in Pain? Yes    Pain Score 7     Pain Location Knee    Pain Orientation Right;Medial    Pain Descriptors / Indicators Aching    Pain Type Acute pain;Surgical pain                             [facility] Adult PT Treatment/Exercise - 02/09/20 0001      Ambulation/Gait   Assistive device None    Gait Pattern Step-through pattern;Decreased hip/knee flexion - right;Decreased weight shift to right;Right flexed knee in stance;Trunk flexed    Ambulation Surface  Level;Indoor    Gait velocity decreased    Gait Comments good effort to maintain symmetrical step length and dorsiflexion at IC      Knee/Hip Exercises: Stretches   Hip Flexor Stretch Right;30 seconds;2 reps    Hip Flexor Stretch Limitations mod thomas + with strap       Knee/Hip Exercises: Aerobic   Nustep L3x2 min (LEs), L1 x 4 min (UEs/LEs)      Knee/Hip Exercises: Machines for Strengthening   Cybex Knee Extension B LEs x10 5#    Cybex Knee Flexion B LEs 2x10 10#      Knee/Hip Exercises: Standing   Terminal Knee Extension Strengthening;Right;1 set;10 reps;Theraband    Theraband Level (Terminal Knee Extension) Level 3 (Green)    Terminal Knee Extension Limitations 10x5"    Forward Step Up Right;Hand Hold:  1;Step Height: 4";15 reps;Step Height: 6"    Forward Step Up Limitations 5x R up, L back, 5x R up, L down, 5x R up, L back with 6"    Wall Squat 1 set;10 reps    Wall Squat Limitations mini wall squat   good wt shift     Knee/Hip Exercises: Supine   Straight Leg Raises Right;1 set;10 reps    Straight Leg Raises Limitations very slight quad lag    Straight Leg Raise with External Rotation Strengthening;Right;1 set;10 reps    Straight Leg Raise with External Rotation Limitations no quad lag      Vasopneumatic   Number Minutes Vasopneumatic  15 minutes    Vasopnuematic Location  Knee    Vasopneumatic Pressure Low    Vasopneumatic Temperature  lowest temp      Manual Therapy   Manual Therapy Joint mobilization;Myofascial release    Manual therapy comments supine    Joint Mobilization R patellar mobs M/L and superior/inferior directions grade 3 to tolerance    Myofascial Release R knee scar massage                     PT Short Term Goals - 01/25/20 1524      PT SHORT TERM GOAL #1   Title Pt will be independent with initial HEP    Period Weeks    Status Achieved    Target Date 02/08/20             PT Long Term Goals - 01/25/20 1524      PT LONG TERM GOAL #1   Title Pt to be independent with advanced HEP    Time 8    Period Weeks    Status Partially Met   met for current     PT LONG TERM GOAL #2   Title Patient to demonstrate 0-120 degrees of AROM at R knee to improve gait and abilty to perform ADLs independently.    Time 8    Period Weeks    Status On-going   01/25/20: 5-75 degrees AROM, 5-77 degrees PROM     PT LONG TERM GOAL #3   Title Patient to demonstrate B LE strength >/=4+/5.    Time 8    Period Weeks    Status On-going   improving- see objective measurements     PT LONG TERM GOAL #4   Title Patient to demonstrate R SLR without quad lag.    Time 8    Period Weeks    Status On-going   still requiring 1 finger assistance with SLR d/t quad  weakness     PT LONG  TERM GOAL #5   Title Pt will demonstrate normal gait pattern with LRAD in order to improve functional mobility and decrease fall risk    Time 8    Period Weeks    Status On-going   anterior trunk lean, decreased L step length, and lack of R TKE at heel strike with 2WW and [facility]                Plan - 02/09/20 0839    Clinical Impression Statement Patient without new complaints- still noting medial knee pain with movement. Worked on patellar mobs with patient demonstrating mild hypomobility in all directions. Provided scar massage to healed portions of incision for improvement in scar mobility. Patient with good weight shift with shallow wall squats today. Increased height of step and challenge with step ups with patient demonstrating hesitancy in unlocking R knee upon step down and demonstrating decreased eccentric control. Light machine strengthening initiated with good muscle control, but with c/o medial knee pain with leg extensions. Ended session with [employer] to R knee for post-exercises edema. No complaints at end of session. Patient progressing well with PT despite R knee pain.    Comorbidities HLD, depression, GERD, L TKA with revision and closed manipulation 2018-2020    Rehab Potential Good    PT Treatment/Interventions ADLs/Self Care Home Management;Cryotherapy;Electrical Stimulation;Moist Heat;Balance training;Therapeutic exercise;Therapeutic activities;Functional mobility training;Stair training;Gait training;Ultrasound;Neuromuscular re-education;Patient/family education;Manual techniques;Vasopneumatic Device;Taping;Energy conservation;Dry needling;Passive range of motion;Scar mobilization    PT Next Visit Plan progress R knee ROM and strength, gait training with/without SPC    Consulted and Agree with Plan of Care Patient           Patient will benefit from skilled therapeutic intervention in order to improve the following deficits and impairments:   Hypomobility, Decreased endurance, Abnormal gait, Increased edema, Decreased scar mobility, Decreased activity tolerance, Decreased strength, Increased fascial restricitons, Pain, Decreased balance, Difficulty walking, Increased muscle spasms, Improper body mechanics, Decreased range of motion, Impaired flexibility, Postural dysfunction  Visit Diagnosis: Acute pain of right knee  Stiffness of right knee, not elsewhere classified  Muscle weakness (generalized)  Other abnormalities of gait and mobility     Problem List Patient Active Problem List   Diagnosis Date Noted  . Status post right knee replacement 01/16/2020  . Osteoarthritis of right knee 01/13/2020  . Hormone replacement therapy (HRT) 03/03/2018  . S/P left unicompartmental knee replacement 01/13/2018  . Arthrofibrosis of total knee arthroplasty, subsequent encounter 01/21/2017  . Adjustment disorder with mixed anxiety and depressed mood 02/22/2016  . Mixed hyperlipidemia 08/22/2015  . Left knee pain 04/19/2014  . BRONCHITIS, ACUTE 07/21/2010     Janene Harvey, PT, DPT 02/09/20 9:46 AM   [facility] [address]  [place], [place], [address] Phone: [phone]   Fax:  [phone]  Name: Joanna Willis MRN: [id number] Date of Birth: Jul 11, 1955

## 2020-02-13 ENCOUNTER — Encounter: Payer: Self-pay | Admitting: Physical Therapy

## 2020-02-13 ENCOUNTER — Ambulatory Visit: Payer: 59 | Admitting: Physical Therapy

## 2020-02-13 ENCOUNTER — Other Ambulatory Visit: Payer: Self-pay

## 2020-02-13 DIAGNOSIS — R2689 Other abnormalities of gait and mobility: Secondary | ICD-10-CM | POA: Diagnosis not present

## 2020-02-13 DIAGNOSIS — M25561 Pain in right knee: Secondary | ICD-10-CM | POA: Diagnosis not present

## 2020-02-13 DIAGNOSIS — M6281 Muscle weakness (generalized): Secondary | ICD-10-CM | POA: Diagnosis not present

## 2020-02-13 DIAGNOSIS — M25661 Stiffness of right knee, not elsewhere classified: Secondary | ICD-10-CM | POA: Diagnosis not present

## 2020-02-13 NOTE — Therapy (Signed)
[facility] [address]  [place], [place], [address] Phone: [phone]   Fax:  [phone]  Physical Therapy Treatment  Patient Details  Name: Joanna Willis MRN: [id number] Date of Birth: January 31, 1956 Referring Provider (PT): Frederik Pear, MD   Encounter Date: 02/13/2020   PT End of Session - 02/13/20 0850    Visit Number 9    Number of Visits 17    Date for PT Re-Evaluation 03/14/20    Authorization Type [facility]    PT Start Time 0759    PT Stop Time 0853    PT Time Calculation (min) 54 min    Activity Tolerance Patient limited by pain;Patient tolerated treatment well    Behavior During Therapy [facility] for tasks assessed/performed           Past Medical History:  Diagnosis Date  . Allergy   . Arthritis   . Depression   . GERD (gastroesophageal reflux disease)   . Hypercholesteremia   . PONV (postoperative nausea and vomiting)   . Postmenopausal   . Wears glasses     Past Surgical History:  Procedure Laterality Date  . ABDOMINAL HYSTERECTOMY  2005   did not remove ovaries  . CESAREAN SECTION  2000  . COLONOSCOPY    . CONVERSION TO TOTAL KNEE Left 01/13/2018   Procedure: Revision of Left knee unicompartmental arthroplasty to total knee arthroplasty;  Surgeon: [name], MD;  Location: [facility];  Service: Orthopedics;  Laterality: Left;  . FOOT ARTHRODESIS  1995   right  . KNEE ARTHROSCOPY WITH MEDIAL MENISECTOMY Left 05/17/2014   Procedure: LEFT ARTHROSCOPY KNEE WITH PARTIAL MEDIAL MENISECTOMY, CHONDROPLASTY OF LATERAL FEMORAL CONDYLE;  Surgeon: Alta Corning, MD;  Location: [facility];  Service: Orthopedics;  Laterality: Left;  . KNEE CLOSED REDUCTION Left 07/21/2018   Procedure: CLOSED MANIPULATION KNEE;  Surgeon: [name], MD;  Location[facility];  Service: Orthopedics;  Laterality: Left;  75mn  . PARTIAL KNEE ARTHROPLASTY Left 01/21/2017   Procedure: UNICOMPARTMENTAL LEFT KNEE;   Surgeon: AGaynelle Arabian MD;  Location: [facility];  Service: Orthopedics;  Laterality: Left;  . Revision left knee     Dr. AWynelle [id number]  . TOTAL KNEE ARTHROPLASTY Right 01/16/2020   Procedure: RIGHT TOTAL KNEE ARTHROPLASTY;  Surgeon: RFrederik Pear MD;  Location[facility];  Service: Orthopedics;  Laterality: Right;    There were no vitals filed for this visit.   Subjective Assessment - 02/13/20 0801    Subjective Had 2 really good nights of sleep followed by not being able to go back to sleep after 2am last night. Unsure of cause.    Pertinent History HLD, depression, GERD, L TKA with revision and closed manipulation 2018-2020    Diagnostic tests none recent    Patient Stated Goals get rid of pain    Currently in Pain? Yes    Pain Score 8     Pain Location Knee    Pain Orientation Right;Medial    Pain Descriptors / Indicators Aching    Pain Type Acute pain;Surgical pain                             [facility] Adult PT Treatment/Exercise - 02/13/20 0001      Knee/Hip Exercises: Stretches   Passive Hamstring Stretch --    Passive Hamstring Stretch Limitations --    Quad Stretch Right;2 reps;30 seconds  Quad Stretch Limitations prone with strap and with/without folded pillow under knee    Other Knee/Hip Stretches R knee flexion stretch at [employer] rail 10x5"    Other Knee/Hip Stretches B butterfly stretch 2x30"      Knee/Hip Exercises: Aerobic   Nustep L3x6 min (UEs/LEs)      Knee/Hip Exercises: Standing   Lateral Step Up Right;1 set;10 reps;Hand Hold: 2;Step Height: 4"    Lateral Step Up Limitations R lateral step up/down    cues to avoid stepping toes down first   Forward Step Up Right;Hand Hold: 1;Step Height: 4";15 reps;Step Height: 6"    Forward Step Up Limitations 5x R up, L back, 5x R up, L down, 5x R up, L back with 6"      Knee/Hip Exercises: Supine   Straight Leg Raises Right;1 set;10 reps    Straight Leg Raises Limitations 1#, no quad lag      Knee/Hip  Exercises: Sidelying   Hip ABduction Strengthening;Right;Left;1 set;10 reps    Hip ABduction Limitations cues for alignment    Hip ADduction Strengthening;Right;Left;1 set;10 reps    Hip ADduction Limitations opposite LE elevated on bolster      Vasopneumatic   Number Minutes Vasopneumatic  10 minutes    Vasopnuematic Location  Knee   R   Vasopneumatic Pressure Low    Vasopneumatic Temperature  lowest temp                  PT Education - 02/13/20 0849    Education Details update to HEP; edu on ice massage to R pes anserine for 5 min max    Person(s) Educated Patient    Methods Explanation;Demonstration;Tactile cues;Verbal cues;Handout    Comprehension Verbalized understanding;Returned demonstration            PT Short Term Goals - 01/25/20 1524      PT SHORT TERM GOAL #1   Title Pt will be independent with initial HEP    Period Weeks    Status Achieved    Target Date 02/08/20             PT Long Term Goals - 01/25/20 1524      PT LONG TERM GOAL #1   Title Pt to be independent with advanced HEP    Time 8    Period Weeks    Status Partially Met   met for current     PT LONG TERM GOAL #2   Title Patient to demonstrate 0-120 degrees of AROM at R knee to improve gait and abilty to perform ADLs independently.    Time 8    Period Weeks    Status On-going   01/25/20: 5-75 degrees AROM, 5-77 degrees PROM     PT LONG TERM GOAL #3   Title Patient to demonstrate B LE strength >/=4+/5.    Time 8    Period Weeks    Status On-going   improving- see objective measurements     PT LONG TERM GOAL #4   Title Patient to demonstrate R SLR without quad lag.    Time 8    Period Weeks    Status On-going   still requiring 1 finger assistance with SLR d/t quad weakness     PT LONG TERM GOAL #5   Title Pt will demonstrate normal gait pattern with LRAD in order to improve functional mobility and decrease fall risk    Time 8    Period Weeks    Status On-going  anterior  trunk lean, decreased L step length, and lack of R TKE at heel strike with 2WW and [facility]                Plan - 02/13/20 0850    Clinical Impression Statement Patient reporting slight improvement in sleeping tolerance and ROM in R knee, but still noting considerable R medial knee pain. Upon observation, patient with localized swelling specific to pes anserine, thus worked on adductor stretching. Initiated sidelying hip strengthening with patient requiring minor cues for alignment, but otherwise with excellent form. Able to demonstrate SLR with small added weight with no quad lag evident. Continued to work on stair climbing activities with short step. Patient still demonstrating hesitancy with eccentric phase of anterior and lateral step down. Updated HEP with lateral step downs for continued practice at home- patient reported understanding. Ended session with Gameready to R knee for edema and pain relief. Patient without complaints at end of session.    Comorbidities HLD, depression, GERD, L TKA with revision and closed manipulation 2018-2020    Rehab Potential Good    PT Treatment/Interventions ADLs/Self Care Home Management;Cryotherapy;Electrical Stimulation;Moist Heat;Balance training;Therapeutic exercise;Therapeutic activities;Functional mobility training;Stair training;Gait training;Ultrasound;Neuromuscular re-education;Patient/family education;Manual techniques;Vasopneumatic Device;Taping;Energy conservation;Dry needling;Passive range of motion;Scar mobilization    PT Next Visit Plan progress R knee ROM and strength, gait training with/without SPC    Consulted and Agree with Plan of Care Patient           Patient will benefit from skilled therapeutic intervention in order to improve the following deficits and impairments:  Hypomobility, Decreased endurance, Abnormal gait, Increased edema, Decreased scar mobility, Decreased activity tolerance, Decreased strength, Increased fascial  restricitons, Pain, Decreased balance, Difficulty walking, Increased muscle spasms, Improper body mechanics, Decreased range of motion, Impaired flexibility, Postural dysfunction  Visit Diagnosis: Acute pain of right knee  Stiffness of right knee, not elsewhere classified  Muscle weakness (generalized)  Other abnormalities of gait and mobility     Problem List Patient Active Problem List   Diagnosis Date Noted  . Status post right knee replacement 01/16/2020  . Osteoarthritis of right knee 01/13/2020  . Hormone replacement therapy (HRT) 03/03/2018  . S/P left unicompartmental knee replacement 01/13/2018  . Arthrofibrosis of total knee arthroplasty, subsequent encounter 01/21/2017  . Adjustment disorder with mixed anxiety and depressed mood 02/22/2016  . Mixed hyperlipidemia 08/22/2015  . Left knee pain 04/19/2014  . BRONCHITIS, ACUTE 07/21/2010     Janene Harvey, PT, DPT 02/13/20 9:01 AM   [facility] [address]  [place], [place], [address] Phone: [phone]   Fax:  [phone]  Name: Joanna Willis MRN: [id number] Date of Birth: 02-03-1956

## 2020-02-16 ENCOUNTER — Ambulatory Visit: Payer: 59 | Admitting: Physical Therapy

## 2020-02-16 ENCOUNTER — Other Ambulatory Visit: Payer: Self-pay

## 2020-02-16 ENCOUNTER — Encounter: Payer: Self-pay | Admitting: Physical Therapy

## 2020-02-16 DIAGNOSIS — M6281 Muscle weakness (generalized): Secondary | ICD-10-CM | POA: Diagnosis not present

## 2020-02-16 DIAGNOSIS — M25561 Pain in right knee: Secondary | ICD-10-CM | POA: Diagnosis not present

## 2020-02-16 DIAGNOSIS — Z760 Encounter for issue of repeat prescription: Secondary | ICD-10-CM | POA: Diagnosis not present

## 2020-02-16 DIAGNOSIS — M25661 Stiffness of right knee, not elsewhere classified: Secondary | ICD-10-CM | POA: Diagnosis not present

## 2020-02-16 DIAGNOSIS — R2689 Other abnormalities of gait and mobility: Secondary | ICD-10-CM

## 2020-02-16 NOTE — Therapy (Signed)
[facility] [address]  [place], [place], [address] Phone: [phone]   Fax:  [phone]  Physical Therapy Progress Note  Patient Details  Name: Joanna Willis MRN: [id number] Date of Birth: 03/21/1956 Referring Provider (PT): Frederik Pear, MD   Encounter Date: 02/16/2020   PT End of Session - 02/16/20 0938    Visit Number 10    Number of Visits 17    Date for PT Re-Evaluation 03/14/20    Authorization Type [facility]    PT Start Time 0802    PT Stop Time 3734    PT Time Calculation (min) 53 min    Activity Tolerance Patient limited by pain;Patient tolerated treatment well    Behavior During Therapy [facility] for tasks assessed/performed           Past Medical History:  Diagnosis Date  . Allergy   . Arthritis   . Depression   . GERD (gastroesophageal reflux disease)   . Hypercholesteremia   . PONV (postoperative nausea and vomiting)   . Postmenopausal   . Wears glasses     Past Surgical History:  Procedure Laterality Date  . ABDOMINAL HYSTERECTOMY  2005   did not remove ovaries  . CESAREAN SECTION  2000  . COLONOSCOPY    . CONVERSION TO TOTAL KNEE Left 01/13/2018   Procedure: Revision of Left knee unicompartmental arthroplasty to total knee arthroplasty;  Surgeon: [name], MD;  [facility];  Service: Orthopedics;  Laterality: Left;  . FOOT ARTHRODESIS  1995   right  . KNEE ARTHROSCOPY WITH MEDIAL MENISECTOMY Left 05/17/2014   Procedure: LEFT ARTHROSCOPY KNEE WITH PARTIAL MEDIAL MENISECTOMY, CHONDROPLASTY OF LATERAL FEMORAL CONDYLE;  Surgeon: Alta Corning, MD;  Location: [facility];  Service: Orthopedics;  Laterality: Left;  . KNEE CLOSED REDUCTION Left 07/21/2018   Procedure: CLOSED MANIPULATION KNEE;  Surgeon: [name], MD;  [facility];  Service: Orthopedics;  Laterality: Left;  49mn  . PARTIAL KNEE ARTHROPLASTY Left 01/21/2017   Procedure: UNICOMPARTMENTAL LEFT KNEE;   Surgeon: AGaynelle Arabian MD;  [facility];  Service: Orthopedics;  Laterality: Left;  . Revision left knee     Dr. AWynelle [id number]  . TOTAL KNEE ARTHROPLASTY Right 01/16/2020   Procedure: RIGHT TOTAL KNEE ARTHROPLASTY;  Surgeon: RFrederik Pear MD;  [facility];  Service: Orthopedics;  Laterality: Right;    There were no vitals filed for this visit.   Subjective Assessment - 02/16/20 0803    Subjective "It's a good day." Slept longer last night than she has since surgery. The R knee is still buckling once in a while but feels like her legs are getting stronger. Reports 50% improvement since initial eval.    Pertinent History HLD, depression, GERD, L TKA with revision and closed manipulation 2018-2020    Diagnostic tests none recent    Patient Stated Goals get rid of pain    Currently in Pain? Yes    Pain Score 4     Pain Location Knee    Pain Orientation Left;Medial    Pain Descriptors / Indicators Aching    Pain Type Acute pain;Surgical pain              [facility] PT Assessment - 02/16/20 0001      Assessment   Medical Diagnosis s/p R TKA    Referring Provider (PT) FFrederik Pear MD    Onset Date/Surgical Date 01/16/20      Observation/Other  Assessments   Focus on Therapeutic Outcomes (FOTO)  knee: 39% (61 % limitation)      AROM   Right Knee Extension 2    Right Knee Flexion 92      PROM   Right Knee Extension 2    Right Knee Flexion 96      Strength   Right Hip Flexion 4+/5    Right Hip ABduction 4+/5    Right Hip ADduction 4/5    Left Hip Flexion 4+/5    Left Hip ABduction 4+/5    Left Hip ADduction 4+/5    Right Knee Flexion 4/5    Right Knee Extension 4/5    Left Knee Flexion 4+/5    Left Knee Extension 5/5    Right Ankle Dorsiflexion 4+/5    Right Ankle Plantar Flexion 4/5    Left Ankle Dorsiflexion 4+/5    Left Ankle Plantar Flexion 4+/5                         [facility] Adult PT Treatment/Exercise - 02/16/20 0001      Ambulation/Gait    Assistive device Straight cane    Gait Pattern Step-through pattern;Decreased hip/knee flexion - right;Decreased weight shift to right;Right flexed knee in stance;Trunk flexed    Ambulation Surface Level;Indoor    Gait velocity decreased    Gait Comments still demonstrating lack of TKE at IC, but improves fluidity of stepping      Knee/Hip Exercises: Stretches   Active Hamstring Stretch Right;1 rep;30 seconds    Active Hamstring Stretch Limitations sitting, with DF    Other Knee/Hip Stretches R knee flexion stretch at TM rail 10x5"    Other Knee/Hip Stretches B butterfly stretch sitting 30"      Knee/Hip Exercises: Aerobic   Nustep L3x6 min (UEs/LEs)      Knee/Hip Exercises: Standing   Lateral Step Up Right;1 set;Hand Hold: 2;Step Height: 4";5 reps    Lateral Step Up Limitations R lateral step up/down    limited by R knee pain     Knee/Hip Exercises: Supine   Heel Slides Right;10 reps;AAROM    Heel Slides Limitations 10x3" heel slide with strap and peanut ball    Straight Leg Raises Strengthening;Right;1 set;5 reps    Straight Leg Raises Limitations very minor quad lag   frequent rest breaks d/t pain over distal quad     Vasopneumatic   Number Minutes Vasopneumatic  10 minutes    Vasopnuematic Location  Knee   R   Vasopneumatic Pressure Low    Vasopneumatic Temperature  lowest temp      Manual Therapy   Manual Therapy Joint mobilization    Manual therapy comments supine    Joint Mobilization R patellar mobs M/L and superior/inferior directions grade 3 to tolerance   good mobility                 PT Education - 02/16/20 0937    Education Details update to HEP- advised patient to discontinue lateral step down d/t pain and feeling of knee instability; discussion on objective progress and remaining impairments    Person(s) Educated Patient    Methods Explanation;Demonstration;Tactile cues;Verbal cues;Handout    Comprehension Verbalized understanding;Returned  demonstration            PT Short Term Goals - 02/16/20 0938      PT SHORT TERM GOAL #1   Title Pt will be independent with initial HEP    Period   Weeks    Status Achieved    Target Date 02/08/20             PT Long Term Goals - 02/16/20 0939      PT LONG TERM GOAL #1   Title Pt to be independent with advanced HEP    Time 8    Period Weeks    Status Partially Met   met for current     PT LONG TERM GOAL #2   Title Patient to demonstrate 0-120 degrees of AROM at R knee to improve gait and abilty to perform ADLs independently.    Time 8    Period Weeks    Status On-going   02/16/20: 2-92 degrees AROM, 2-96 degrees PROM     PT LONG TERM GOAL #3   Title Patient to demonstrate B LE strength >/=4+/5.    Time 8    Period Weeks    Status Partially Met   improved in R hip flexion and abduction, B knee extension, R knee flexion, and R ankle dorsiflexion strength     PT LONG TERM GOAL #4   Title Patient to demonstrate R SLR without quad lag.    Time 8    Period Weeks    Status On-going   demonstrating very slight quad lag     PT LONG TERM GOAL #5   Title Pt will demonstrate normal gait pattern with LRAD in order to improve functional mobility and decrease fall risk    Time 8    Period Weeks    Status On-going   still demonstrating lack of R TKE at IC, but improved in speed and fluidity of stepping with [facility]                Plan - 02/16/20 0943    Clinical Impression Statement Patient reporting 50% improvement since initial eval. Still noting occasional R knee buckling but reports that she feels like her legs are getting stronger. Patient now reaching R knee AROM 2-92 degrees, PROM 2-96 degrees after stretching and patellar mobilizations. Strength has improved in R hip flexion and abduction, B knee extension, R knee flexion, and R ankle dorsiflexion strength. Now most limited in R hip adduction, knee flexion, and knee extension strength. SLR today showed very slight quad  lag- likely d/t pain as patient noted increased distal quad discomfort with this activity today. Gait pattern has improved in speed and fluidity of stepping with SPC, however patient still lacking full TKE on R LE at initial contact. Reviewed LE stretching and lateral step downs for max benefit- patient painful and reporting R knee instability with step downs today, thus advised her to discontinue this exercise at home for now. Patient reported understanding. Ended session with Gameready to R knee for pain relief- no complaints at end of session. Patient is progressing well towards goals, with pain being biggest barrier to progress at this time. Would continue to benefit from skilled PT services to address remaining goals.    Comorbidities HLD, depression, GERD, L TKA with revision and closed manipulation 2018-2020    Rehab Potential Good    PT Treatment/Interventions ADLs/Self Care Home Management;Cryotherapy;Electrical Stimulation;Moist Heat;Balance training;Therapeutic exercise;Therapeutic activities;Functional mobility training;Stair training;Gait training;Ultrasound;Neuromuscular re-education;Patient/family education;Manual techniques;Vasopneumatic Device;Taping;Energy conservation;Dry needling;Passive range of motion;Scar mobilization    PT Next Visit Plan review 4" lateral step downs; progress R knee ROM and strength, gait training with/without SPC    Consulted and Agree with Plan of Care Patient  Patient will benefit from skilled therapeutic intervention in order to improve the following deficits and impairments:  Hypomobility, Decreased endurance, Abnormal gait, Increased edema, Decreased scar mobility, Decreased activity tolerance, Decreased strength, Increased fascial restricitons, Pain, Decreased balance, Difficulty walking, Increased muscle spasms, Improper body mechanics, Decreased range of motion, Impaired flexibility, Postural dysfunction  Visit Diagnosis: Acute pain of right  knee  Stiffness of right knee, not elsewhere classified  Muscle weakness (generalized)  Other abnormalities of gait and mobility     Problem List Patient Active Problem List   Diagnosis Date Noted  . Status post right knee replacement 01/16/2020  . Osteoarthritis of right knee 01/13/2020  . Hormone replacement therapy (HRT) 03/03/2018  . S/P left unicompartmental knee replacement 01/13/2018  . Arthrofibrosis of total knee arthroplasty, subsequent encounter 01/21/2017  . Adjustment disorder with mixed anxiety and depressed mood 02/22/2016  . Mixed hyperlipidemia 08/22/2015  . Left knee pain 04/19/2014  . BRONCHITIS, ACUTE 07/21/2010     Janene Harvey, PT, DPT 02/16/20 9:50 AM   [facility] [address]  [place], [place], [address] Phone: [phone]   Fax:  [phone]  Name: KAHLIYAH DICK MRN: [id number] Date of Birth: 07-28-1955

## 2020-02-20 ENCOUNTER — Other Ambulatory Visit: Payer: Self-pay

## 2020-02-20 ENCOUNTER — Ambulatory Visit: Payer: 59

## 2020-02-20 DIAGNOSIS — M6281 Muscle weakness (generalized): Secondary | ICD-10-CM | POA: Diagnosis not present

## 2020-02-20 DIAGNOSIS — M25661 Stiffness of right knee, not elsewhere classified: Secondary | ICD-10-CM | POA: Diagnosis not present

## 2020-02-20 DIAGNOSIS — M25561 Pain in right knee: Secondary | ICD-10-CM | POA: Diagnosis not present

## 2020-02-20 DIAGNOSIS — R2689 Other abnormalities of gait and mobility: Secondary | ICD-10-CM | POA: Diagnosis not present

## 2020-02-20 NOTE — Therapy (Signed)
[facility] [address]  [place], [place], [address] Phone: [phone]   Fax:  [phone]  Physical Therapy Treatment  Patient Details  Name: Joanna Willis MRN: [id number] Date of Birth: 08-Jul-1955 Referring Provider (PT): Frederik Pear, MD   Encounter Date: 02/20/2020   PT End of Session - 02/20/20 0812    Visit Number 11    Number of Visits 17    Date for PT Re-Evaluation 03/14/20    Authorization Type [facility]    PT Start Time 0800    PT Stop Time 3748    PT Time Calculation (min) 53 min    Activity Tolerance Patient limited by pain;Patient tolerated treatment well    Behavior During Therapy [facility] for tasks assessed/performed           Past Medical History:  Diagnosis Date  . Allergy   . Arthritis   . Depression   . GERD (gastroesophageal reflux disease)   . Hypercholesteremia   . PONV (postoperative nausea and vomiting)   . Postmenopausal   . Wears glasses     Past Surgical History:  Procedure Laterality Date  . ABDOMINAL HYSTERECTOMY  2005   did not remove ovaries  . CESAREAN SECTION  2000  . COLONOSCOPY    . CONVERSION TO TOTAL KNEE Left 01/13/2018   Procedure: Revision of Left knee unicompartmental arthroplasty to total knee arthroplasty;  Surgeon: [name], MD;  Location: [facility];  Service: Orthopedics;  Laterality: Left;  . FOOT ARTHRODESIS  1995   right  . KNEE ARTHROSCOPY WITH MEDIAL MENISECTOMY Left 05/17/2014   Procedure: LEFT ARTHROSCOPY KNEE WITH PARTIAL MEDIAL MENISECTOMY, CHONDROPLASTY OF LATERAL FEMORAL CONDYLE;  Surgeon: Alta Corning, MD;  Location: [place];  Service: Orthopedics;  Laterality: Left;  . KNEE CLOSED REDUCTION Left 07/21/2018   Procedure: CLOSED MANIPULATION KNEE;  Surgeon: [name], MD;  Location: [facility];  Service: Orthopedics;  Laterality: Left;  100mn  . PARTIAL KNEE ARTHROPLASTY Left 01/21/2017   Procedure: UNICOMPARTMENTAL LEFT KNEE;   Surgeon: AGaynelle Arabian MD;  Location: [facility];  Service: Orthopedics;  Laterality: Left;  . Revision left knee     Dr. AWynelle [id number]  . TOTAL KNEE ARTHROPLASTY Right 01/16/2020   Procedure: RIGHT TOTAL KNEE ARTHROPLASTY;  Surgeon: RFrederik Pear MD;  Location: [facility];  Service: Orthopedics;  Laterality: Right;    There were no vitals filed for this visit.   Subjective Assessment - 02/20/20 0809    Subjective Notes she is sleeping better due to improved pain.    Pertinent History HLD, depression, GERD, L TKA with revision and closed manipulation 2018-2020    Diagnostic tests none recent    Patient Stated Goals get rid of pain    Currently in Pain? No/denies    Pain Score 0-No pain   up to a 7/10   Pain Location Knee    Pain Orientation Right;Medial    Pain Descriptors / Indicators Aching    Multiple Pain Sites No                             [facility] Adult PT Treatment/Exercise - 02/20/20 0001      Ambulation/Gait   Ambulation/Gait Yes    Ambulation/Gait Assistance 5: Supervision    Ambulation/Gait Assistance Details cues for upright posture and B TKE without AD    Ambulation Distance (Feet) 90 Feet    Assistive  device Straight cane      Knee/Hip Exercises: Stretches   Passive Hamstring Stretch Right;1 rep;30 seconds    Passive Hamstring Stretch Limitations supine with strap    Hip Flexor Stretch Right;30 seconds;2 reps    Hip Flexor Stretch Limitations mod thomas + with strap     Gastroc Stretch Right;2 reps;30 seconds    Gastroc Stretch Limitations runners stretch into counter     Other Knee/Hip Stretches R HS+groin stretch supine with strap x 30 sec       Knee/Hip Exercises: Aerobic   Nustep L4x6 min (UEs/LEs)      Knee/Hip Exercises: Standing   Heel Raises Both;15 reps    Heel Raises Limitations at machine     Lateral Step Up Right;10 reps;Hand Hold: 2;Step Height: 8"    Lateral Step Up Limitations cues for slow step-down     Forward Step Up Right;10  reps;Step Height: 8";Hand Hold: 1    Forward Step Up Limitations slow step-back     Step Down Right;10 reps;Step Height: 4";Hand Hold: 2    Step Down Limitations Heavy UE support       Vasopneumatic   Number Minutes Vasopneumatic  10 minutes    Vasopnuematic Location  Knee   R   Vasopneumatic Pressure Low    Vasopneumatic Temperature  lowest temp                    PT Short Term Goals - 02/16/20 0938      PT SHORT TERM GOAL #1   Title Pt will be independent with initial HEP    Period Weeks    Status Achieved    Target Date 02/08/20             PT Long Term Goals - 02/16/20 0939      PT LONG TERM GOAL #1   Title Pt to be independent with advanced HEP    Time 8    Period Weeks    Status Partially Met   met for current     PT LONG TERM GOAL #2   Title Patient to demonstrate 0-120 degrees of AROM at R knee to improve gait and abilty to perform ADLs independently.    Time 8    Period Weeks    Status On-going   02/16/20: 2-92 degrees AROM, 2-96 degrees PROM     PT LONG TERM GOAL #3   Title Patient to demonstrate B LE strength >/=4+/5.    Time 8    Period Weeks    Status Partially Met   improved in R hip flexion and abduction, B knee extension, R knee flexion, and R ankle dorsiflexion strength     PT LONG TERM GOAL #4   Title Patient to demonstrate R SLR without quad lag.    Time 8    Period Weeks    Status On-going   demonstrating very slight quad lag     PT LONG TERM GOAL #5   Title Pt will demonstrate normal gait pattern with LRAD in order to improve functional mobility and decrease fall risk    Time 8    Period Weeks    Status On-going   still demonstrating lack of R TKE at IC, but improved in speed and fluidity of stepping with [facility]                Plan - 02/20/20 0812    Clinical Impression Statement Joanna Willis reporting that her R knee pain has  improved to the place where she is able to sleep better.  MT did address soft tissue restriction in R  Gastroc and R HS/adductors with TPs noted throughout.  Instructed pt. in alternative LE stretching to address LE tightness in adductors with good understanding.  Progressed step-training focusing on improving eccentric quad control with good tolerance.  Did have increased R medial knee pain up to 7/10 for short periods with therex today which recovered with rest.  Ended visit with ice/compression to R knee to reduce pain and swelling.    Comorbidities HLD, depression, GERD, L TKA with revision and closed manipulation 2018-2020    Rehab Potential Good    PT Frequency 2x / week    PT Duration 8 weeks    PT Treatment/Interventions ADLs/Self Care Home Management;Cryotherapy;Electrical Stimulation;Moist Heat;Balance training;Therapeutic exercise;Therapeutic activities;Functional mobility training;Stair training;Gait training;Ultrasound;Neuromuscular re-education;Patient/family education;Manual techniques;Vasopneumatic Device;Taping;Energy conservation;Dry needling;Passive range of motion;Scar mobilization    PT Next Visit Plan Progress R knee ROM and strength, gait training with/without SPC    Consulted and Agree with Plan of Care Patient           Patient will benefit from skilled therapeutic intervention in order to improve the following deficits and impairments:  Hypomobility, Decreased endurance, Abnormal gait, Increased edema, Decreased scar mobility, Decreased activity tolerance, Decreased strength, Increased fascial restricitons, Pain, Decreased balance, Difficulty walking, Increased muscle spasms, Improper body mechanics, Decreased range of motion, Impaired flexibility, Postural dysfunction  Visit Diagnosis: Acute pain of right knee  Stiffness of right knee, not elsewhere classified  Muscle weakness (generalized)  Other abnormalities of gait and mobility     Problem List Patient Active Problem List   Diagnosis Date Noted  . Status post right knee replacement 01/16/2020  .  Osteoarthritis of right knee 01/13/2020  . Hormone replacement therapy (HRT) 03/03/2018  . S/P left unicompartmental knee replacement 01/13/2018  . Arthrofibrosis of total knee arthroplasty, subsequent encounter 01/21/2017  . Adjustment disorder with mixed anxiety and depressed mood 02/22/2016  . Mixed hyperlipidemia 08/22/2015  . Left knee pain 04/19/2014  . BRONCHITIS, ACUTE 07/21/2010    Bess Harvest, PTA 02/20/20 11:32 AM   [facility] [address]  [place], [place], [address] Phone: [phone]   Fax:  [phone]  Name: Joanna Willis MRN: [id number] Date of Birth: October 03, 1955

## 2020-02-22 ENCOUNTER — Encounter: Payer: Self-pay | Admitting: Physical Therapy

## 2020-02-22 ENCOUNTER — Ambulatory Visit: Payer: 59 | Attending: Orthopedic Surgery | Admitting: Physical Therapy

## 2020-02-22 ENCOUNTER — Other Ambulatory Visit: Payer: Self-pay

## 2020-02-22 DIAGNOSIS — R2689 Other abnormalities of gait and mobility: Secondary | ICD-10-CM | POA: Diagnosis not present

## 2020-02-22 DIAGNOSIS — M25561 Pain in right knee: Secondary | ICD-10-CM | POA: Insufficient documentation

## 2020-02-22 DIAGNOSIS — M6281 Muscle weakness (generalized): Secondary | ICD-10-CM | POA: Diagnosis not present

## 2020-02-22 DIAGNOSIS — M25661 Stiffness of right knee, not elsewhere classified: Secondary | ICD-10-CM | POA: Diagnosis not present

## 2020-02-22 NOTE — Therapy (Signed)
[facility] [address]  [place], [place], [address] Phone: [phone]   Fax:  [phone]  Physical Therapy Treatment  Patient Details  Name: Joanna Willis MRN: [id number] Date of Birth: 1956-05-04 Referring Provider (PT): Frederik Pear, MD   Encounter Date: 02/22/2020   PT End of Session - 02/22/20 1153    Visit Number 12    Number of Visits 17    Date for PT Re-Evaluation 03/14/20    Authorization Type [facility]    PT Start Time 0841    PT Stop Time 0933    PT Time Calculation (min) 52 min    Activity Tolerance Patient limited by pain;Patient tolerated treatment well    Behavior During Therapy [facility] for tasks assessed/performed           Past Medical History:  Diagnosis Date  . Allergy   . Arthritis   . Depression   . GERD (gastroesophageal reflux disease)   . Hypercholesteremia   . PONV (postoperative nausea and vomiting)   . Postmenopausal   . Wears glasses     Past Surgical History:  Procedure Laterality Date  . ABDOMINAL HYSTERECTOMY  2005   did not remove ovaries  . CESAREAN SECTION  2000  . COLONOSCOPY    . CONVERSION TO TOTAL KNEE Left 01/13/2018   Procedure: Revision of Left knee unicompartmental arthroplasty to total knee arthroplasty;  Surgeon: [name], MD;  Location: [facility];  Service: Orthopedics;  Laterality: Left;  . FOOT ARTHRODESIS  1995   right  . KNEE ARTHROSCOPY WITH MEDIAL MENISECTOMY Left 05/17/2014   Procedure: LEFT ARTHROSCOPY KNEE WITH PARTIAL MEDIAL MENISECTOMY, CHONDROPLASTY OF LATERAL FEMORAL CONDYLE;  Surgeon: Alta Corning, MD;  Location: [place];  Service: Orthopedics;  Laterality: Left;  . KNEE CLOSED REDUCTION Left 07/21/2018   Procedure: CLOSED MANIPULATION KNEE;  Surgeon: [name], MD;  Location: [facility];  Service: Orthopedics;  Laterality: Left;  61mn  . PARTIAL KNEE ARTHROPLASTY Left 01/21/2017   Procedure: UNICOMPARTMENTAL LEFT KNEE;   Surgeon: AGaynelle Arabian MD;  Location[facility];  Service: Orthopedics;  Laterality: Left;  . Revision left knee     Dr. AWynelle [id number]  . TOTAL KNEE ARTHROPLASTY Right 01/16/2020   Procedure: RIGHT TOTAL KNEE ARTHROPLASTY;  Surgeon: RFrederik Pear MD;  Location: [facility];  Service: Orthopedics;  Laterality: Right;    There were no vitals filed for this visit.   Subjective Assessment - 02/22/20 0843    Subjective Had some muscle spasms in the calf last session which seemed to travel up the buttock later that day. Feeling a little better now after some massage.    Pertinent History HLD, depression, GERD, L TKA with revision and closed manipulation 2018-2020    Diagnostic tests none recent    Patient Stated Goals get rid of pain    Currently in Pain? Yes    Pain Score 3     Pain Location Knee    Pain Orientation Right;Medial    Pain Descriptors / Indicators Aching    Pain Type Acute pain;Surgical pain              [facility] PT Assessment - 02/22/20 0001      Assessment   Medical Diagnosis s/p R TKA    Referring Provider (PT) FFrederik Pear MD    Onset Date/Surgical Date 01/16/20      AROM   Overall AROM Comments objective measures carried over from 02/16/20  Right Knee Extension 2    Right Knee Flexion 92      PROM   Right Knee Extension 2    Right Knee Flexion 96      Strength   Overall Strength Comments carried over from 02/16/20    Right Hip Flexion 4+/5    Right Hip ABduction 4+/5    Right Hip ADduction 4/5    Left Hip Flexion 4+/5    Left Hip ABduction 4+/5    Left Hip ADduction 4+/5    Right Knee Flexion 4/5    Right Knee Extension 4/5    Left Knee Flexion 4+/5    Left Knee Extension 5/5    Right Ankle Dorsiflexion 4+/5    Right Ankle Plantar Flexion 4/5    Left Ankle Dorsiflexion 4+/5    Left Ankle Plantar Flexion 4+/5                         [facility] Adult PT Treatment/Exercise - 02/22/20 0001      Knee/Hip Exercises: Stretches   Active  Hamstring Stretch Right;30 seconds;2 reps    Active Hamstring Stretch Limitations sitting, with DF    Other Knee/Hip Stretches R modified ER stretch with folded pillow under R knee 30"      Knee/Hip Exercises: Aerobic   Recumbent Bike Lvl 1, 6 min - partial recolutions       Knee/Hip Exercises: Machines for Strengthening   Cybex Knee Extension B LEs conc, R ecc x10 5#   limited ROM and pain   Cybex Knee Flexion B LEs conc, R ecc x10 10#      Knee/Hip Exercises: Standing   Heel Raises Both;10 reps;2 sets    Heel Raises Limitations B conc, R ecc; 2 ski poles   limited R LE wt shift    Lateral Step Up Right;10 reps;Hand Hold: 2;Step Height: 4";Step Height: 6"    Lateral Step Up Limitations more focus on step down with 4", focus on step up with 6"   anterior trunk lean and hesitation to unlock R knee     Knee/Hip Exercises: Sidelying   Hip ADduction Strengthening;Right;1 set;15 reps    Hip ADduction Limitations opposite LE elevated on bolster   cues for TKE     Vasopneumatic   Number Minutes Vasopneumatic  10 minutes    Vasopnuematic Location  Knee   R   Vasopneumatic Pressure Low    Vasopneumatic Temperature  lowest temp                  PT Education - 02/22/20 1108    Education Details update/consolidation of HEP    Person(s) Educated Patient    Methods Explanation;Demonstration;Tactile cues;Verbal cues;Handout    Comprehension Verbalized understanding            PT Short Term Goals - 02/16/20 0938      PT SHORT TERM GOAL #1   Title Pt will be independent with initial HEP    Period Weeks    Status Achieved    Target Date 02/08/20             PT Long Term Goals - 02/16/20 0939      PT LONG TERM GOAL #1   Title Pt to be independent with advanced HEP    Time 8    Period Weeks    Status Partially Met   met for current     PT LONG TERM GOAL #2  Title Patient to demonstrate 0-120 degrees of AROM at R knee to improve gait and abilty to perform ADLs  independently.    Time 8    Period Weeks    Status On-going   02/16/20: 2-92 degrees AROM, 2-96 degrees PROM     PT LONG TERM GOAL #3   Title Patient to demonstrate B LE strength >/=4+/5.    Time 8    Period Weeks    Status Partially Met   improved in R hip flexion and abduction, B knee extension, R knee flexion, and R ankle dorsiflexion strength     PT LONG TERM GOAL #4   Title Patient to demonstrate R SLR without quad lag.    Time 8    Period Weeks    Status On-going   demonstrating very slight quad lag     PT LONG TERM GOAL #5   Title Pt will demonstrate normal gait pattern with LRAD in order to improve functional mobility and decrease fall risk    Time 8    Period Weeks    Status On-going   still demonstrating lack of R TKE at IC, but improved in speed and fluidity of stepping with [facility]                Plan - 02/22/20 1153    Clinical Impression Statement Patient reports that she had some muscle spasms in the R calf last session which seemed to travel up the buttock later that day. Feeling a little better now after some massage. Machine strengthening was progressed with increased isolation of R LE musculature. Patient demonstrated lack of TKE with leg extensions but able to continue despite R knee pain. Continued with gentle LE stretching with patient noting good benefit from hip ER stretching. Initiated heel raises with R LE bias, with patient demonstrating limited weight shift d/t pain. Re-visited lateral step up/downs with patient demonstrating hesitancy to unlock R knee upon step down as well as anterior trunk lean. Ended session with [place] to R knee for post-exercise soreness. Patient is demonstrating good effort with therapy, with continued R knee pain as biggest barrier to progress at this time.    Comorbidities HLD, depression, GERD, L TKA with revision and closed manipulation 2018-2020    Rehab Potential Good    PT Frequency 2x / week    PT Duration 8 weeks    PT  Treatment/Interventions ADLs/Self Care Home Management;Cryotherapy;Electrical Stimulation;Moist Heat;Balance training;Therapeutic exercise;Therapeutic activities;Functional mobility training;Stair training;Gait training;Ultrasound;Neuromuscular re-education;Patient/family education;Manual techniques;Vasopneumatic Device;Taping;Energy conservation;Dry needling;Passive range of motion;Scar mobilization    PT Next Visit Plan Progress R knee ROM and strength, gait training with/without SPC    Consulted and Agree with Plan of Care Patient           Patient will benefit from skilled therapeutic intervention in order to improve the following deficits and impairments:  Hypomobility, Decreased endurance, Abnormal gait, Increased edema, Decreased scar mobility, Decreased activity tolerance, Decreased strength, Increased fascial restricitons, Pain, Decreased balance, Difficulty walking, Increased muscle spasms, Improper body mechanics, Decreased range of motion, Impaired flexibility, Postural dysfunction  Visit Diagnosis: Acute pain of right knee  Stiffness of right knee, not elsewhere classified  Muscle weakness (generalized)  Other abnormalities of gait and mobility     Problem List Patient Active Problem List   Diagnosis Date Noted  . Status post right knee replacement 01/16/2020  . Osteoarthritis of right knee 01/13/2020  . Hormone replacement therapy (HRT) 03/03/2018  . S/P left unicompartmental knee replacement 01/13/2018  .  Arthrofibrosis of total knee arthroplasty, subsequent encounter 01/21/2017  . Adjustment disorder with mixed anxiety and depressed mood 02/22/2016  . Mixed hyperlipidemia 08/22/2015  . Left knee pain 04/19/2014  . BRONCHITIS, ACUTE 07/21/2010     Janene Harvey, PT, DPT 02/22/20 11:59 AM   [facility] [address]  [place], [place], [address] Phone: [phone]   Fax:   [phone]  Name: Joanna Willis MRN: [id number] Date of Birth: [birth year]

## 2020-02-23 ENCOUNTER — Encounter: Payer: 59 | Admitting: Physical Therapy

## 2020-02-28 ENCOUNTER — Other Ambulatory Visit: Payer: Self-pay

## 2020-02-28 ENCOUNTER — Ambulatory Visit: Payer: 59

## 2020-02-28 DIAGNOSIS — M6281 Muscle weakness (generalized): Secondary | ICD-10-CM | POA: Diagnosis not present

## 2020-02-28 DIAGNOSIS — M25661 Stiffness of right knee, not elsewhere classified: Secondary | ICD-10-CM | POA: Diagnosis not present

## 2020-02-28 DIAGNOSIS — M25561 Pain in right knee: Secondary | ICD-10-CM | POA: Diagnosis not present

## 2020-02-28 DIAGNOSIS — R2689 Other abnormalities of gait and mobility: Secondary | ICD-10-CM

## 2020-02-28 NOTE — Therapy (Signed)
[facility] [address]  [place], [place], [address] Phone: [phone]   Fax:  [phone]  Physical Therapy Treatment  Patient Details  Name: Joanna Willis MRN: [id number] Date of Birth: [birth year] Referring Provider (PT): Frederik Pear, MD   Encounter Date: 02/28/2020   PT End of Session - 02/28/20 0813    Visit Number 13    Number of Visits 17    Date for PT Re-Evaluation 03/14/20    Authorization Type [facility]    PT Start Time 0802    PT Stop Time 3710    PT Time Calculation (min) 53 min    Activity Tolerance Patient limited by pain;Patient tolerated treatment well    Behavior During Therapy [facility] for tasks assessed/performed           Past Medical History:  Diagnosis Date  . Allergy   . Arthritis   . Depression   . GERD (gastroesophageal reflux disease)   . Hypercholesteremia   . PONV (postoperative nausea and vomiting)   . Postmenopausal   . Wears glasses     Past Surgical History:  Procedure Laterality Date  . ABDOMINAL HYSTERECTOMY  2005   did not remove ovaries  . CESAREAN SECTION  2000  . COLONOSCOPY    . CONVERSION TO TOTAL KNEE Left 01/13/2018   Procedure: Revision of Left knee unicompartmental arthroplasty to total knee arthroplasty;  Surgeon: [name], MD;  Location: [facility];  Service: Orthopedics;  Laterality: Left;  . FOOT ARTHRODESIS  1995   right  . KNEE ARTHROSCOPY WITH MEDIAL MENISECTOMY Left 05/17/2014   Procedure: LEFT ARTHROSCOPY KNEE WITH PARTIAL MEDIAL MENISECTOMY, CHONDROPLASTY OF LATERAL FEMORAL CONDYLE;  Surgeon: Alta Corning, MD;  Location: [facility];  Service: Orthopedics;  Laterality: Left;  . KNEE CLOSED REDUCTION Left 07/21/2018   Procedure: CLOSED MANIPULATION KNEE;  Surgeon: [name], MD;  Location: [facility];  Service: Orthopedics;  Laterality: Left;  75mn  . PARTIAL KNEE ARTHROPLASTY Left 01/21/2017   Procedure: UNICOMPARTMENTAL LEFT KNEE;   Surgeon: AGaynelle Arabian MD;  Location: [facility];  Service: Orthopedics;  Laterality: Left;  . Revision left knee     Dr. AWynelle [id number]  . TOTAL KNEE ARTHROPLASTY Right 01/16/2020   Procedure: RIGHT TOTAL KNEE ARTHROPLASTY;  Surgeon: RFrederik Pear MD;  Location: [facility];  Service: Orthopedics;  Laterality: Right;    There were no vitals filed for this visit.   Subjective Assessment - 02/28/20 0807    Subjective Pt. reporting she is now taking Meloxicam and voltaren cream for pain relief with good results since f/u.    Pertinent History HLD, depression, GERD, L TKA with revision and closed manipulation 2018-2020    Diagnostic tests none recent    Patient Stated Goals get rid of pain    Currently in Pain? Yes    Pain Score 4     Pain Location Knee    Pain Orientation Right;Medial    Pain Descriptors / Indicators Sharp    Pain Type Acute pain;Surgical pain    Multiple Pain Sites No              [facility] PT Assessment - 02/28/20 0001      AROM   AROM Assessment Site Knee    Right/Left Knee Right    Right Knee Flexion 106      PROM   PROM Assessment Site Knee    Right/Left Knee Right    Right Knee  Flexion 108                         [facility] Adult PT Treatment/Exercise - 02/28/20 0001      Ambulation/Gait   Ambulation/Gait Yes    Ambulation/Gait Assistance 5: Supervision    Ambulation/Gait Assistance Details cues for increased R hip/knee flexion avoiding circumduction     Ambulation Distance (Feet) 300 Feet    Assistive device None      Knee/Hip Exercises: Stretches   Knee: Self-Stretch Limitations 5" x 10 standing knee flexion stretch into TM platform     Gastroc Stretch Right;2 reps;30 seconds    Gastroc Stretch Limitations prostretch, runners stretch       Knee/Hip Exercises: Aerobic   Recumbent Bike Lvl 1, 6 min - full revolutions       Knee/Hip Exercises: Standing   Step Down Right;10 reps;Step Height: 6";Hand Hold: 2    Step Down Limitations mod UE  support and some hip compensation     Wall Squat --   x 12 reps    Wall Squat Limitations leaning on green p-ball     SLS with Vectors Alternating cone knock over/righting 2 x 7 cones each LE   did not use AD     Knee/Hip Exercises: Supine   Bridges Both;15 reps;Strengthening      Vasopneumatic   Number Minutes Vasopneumatic  10 minutes    Vasopnuematic Location  Knee   R   Vasopneumatic Pressure Low    Vasopneumatic Temperature  lowest temp                    PT Short Term Goals - 02/16/20 0938      PT SHORT TERM GOAL #1   Title Pt will be independent with initial HEP    Period Weeks    Status Achieved    Target Date 02/08/20             PT Long Term Goals - 02/28/20 0914      PT LONG TERM GOAL #1   Title Pt to be independent with advanced HEP    Time 8    Period Weeks    Status Partially Met   met for current     PT LONG TERM GOAL #2   Title Patient to demonstrate 0-120 degrees of AROM at R knee to improve gait and abilty to perform ADLs independently.    Time 8    Period Weeks    Status On-going   02/28/20: 2-106 degrees AROM, 2-108 degrees PROM     PT LONG TERM GOAL #3   Title Patient to demonstrate B LE strength >/=4+/5.    Time 8    Period Weeks    Status Partially Met   improved in R hip flexion and abduction, B knee extension, R knee flexion, and R ankle dorsiflexion strength     PT LONG TERM GOAL #4   Title Patient to demonstrate R SLR without quad lag.    Time 8    Period Weeks    Status On-going   demonstrating very slight quad lag     PT LONG TERM GOAL #5   Title Pt will demonstrate normal gait pattern with LRAD in order to improve functional mobility and decrease fall risk    Time 8    Period Weeks    Status On-going   still demonstrating lack of R TKE at IC, but improved in speed  and fluidity of stepping with [facility]                Plan - 02/28/20 0812    Clinical Impression Statement Joanna Willis reporting she had a good f/u with MD  who prescribed her Meloxicam which has improved her R knee swelling and comfort.  Able to demo improved R knee AROM flexion to 106 dg, PROM flexion to 108 dg.  Progressing toward LTG #2. Pt. noting improved tolerance for hip/knee flexion with gait cycle able to demo overall improved gait mechanics without AD.  Ended visit with ice/compression to R knee to reduce post-exercise swelling.    Comorbidities HLD, depression, GERD, L TKA with revision and closed manipulation 2018-2020    Rehab Potential Good    PT Frequency 2x / week    PT Duration 8 weeks    PT Treatment/Interventions ADLs/Self Care Home Management;Cryotherapy;Electrical Stimulation;Moist Heat;Balance training;Therapeutic exercise;Therapeutic activities;Functional mobility training;Stair training;Gait training;Ultrasound;Neuromuscular re-education;Patient/family education;Manual techniques;Vasopneumatic Device;Taping;Energy conservation;Dry needling;Passive range of motion;Scar mobilization    PT Next Visit Plan Progress R knee ROM and strength, gait training with/without SPC    Consulted and Agree with Plan of Care Patient           Patient will benefit from skilled therapeutic intervention in order to improve the following deficits and impairments:  Hypomobility, Decreased endurance, Abnormal gait, Increased edema, Decreased scar mobility, Decreased activity tolerance, Decreased strength, Increased fascial restricitons, Pain, Decreased balance, Difficulty walking, Increased muscle spasms, Improper body mechanics, Decreased range of motion, Impaired flexibility, Postural dysfunction  Visit Diagnosis: Acute pain of right knee  Stiffness of right knee, not elsewhere classified  Muscle weakness (generalized)  Other abnormalities of gait and mobility     Problem List Patient Active Problem List   Diagnosis Date Noted  . Status post right knee replacement 01/16/2020  . Osteoarthritis of right knee 01/13/2020  . Hormone  replacement therapy (HRT) 03/03/2018  . S/P left unicompartmental knee replacement 01/13/2018  . Arthrofibrosis of total knee arthroplasty, subsequent encounter 01/21/2017  . Adjustment disorder with mixed anxiety and depressed mood 02/22/2016  . Mixed hyperlipidemia 08/22/2015  . Left knee pain 04/19/2014  . BRONCHITIS, ACUTE 07/21/2010    Bess Harvest, PTA 02/28/20 9:16 AM   [facility] [address]  [place], [place], [address] Phone: [phone]   Fax:  [phone]  Name: Joanna Willis MRN: [id number] Date of Birth: Oct 27, 1955

## 2020-03-01 ENCOUNTER — Encounter: Payer: Self-pay | Admitting: Physical Therapy

## 2020-03-01 ENCOUNTER — Ambulatory Visit: Payer: 59 | Admitting: Physical Therapy

## 2020-03-01 ENCOUNTER — Other Ambulatory Visit: Payer: Self-pay

## 2020-03-01 DIAGNOSIS — M25561 Pain in right knee: Secondary | ICD-10-CM

## 2020-03-01 DIAGNOSIS — M25661 Stiffness of right knee, not elsewhere classified: Secondary | ICD-10-CM

## 2020-03-01 DIAGNOSIS — M6281 Muscle weakness (generalized): Secondary | ICD-10-CM

## 2020-03-01 DIAGNOSIS — R2689 Other abnormalities of gait and mobility: Secondary | ICD-10-CM | POA: Diagnosis not present

## 2020-03-01 NOTE — Therapy (Signed)
[facility] [address]  [place], [place], [address] Phone: [phone]   Fax:  [phone]  Physical Therapy Treatment  Patient Details  Name: Joanna Willis MRN: [id number] Date of Birth: 07/19/1955 Referring Provider (PT): Frederik Pear, MD   Encounter Date: 03/01/2020   PT End of Session - 03/01/20 0841    Visit Number 14    Number of Visits 17    Date for PT Re-Evaluation 03/14/20    Authorization Type [facility]    PT Start Time 0801    PT Stop Time 0850    PT Time Calculation (min) 49 min    Activity Tolerance Patient limited by pain;Patient tolerated treatment well    Behavior During Therapy [facility] for tasks assessed/performed           Past Medical History:  Diagnosis Date  . Allergy   . Arthritis   . Depression   . GERD (gastroesophageal reflux disease)   . Hypercholesteremia   . PONV (postoperative nausea and vomiting)   . Postmenopausal   . Wears glasses     Past Surgical History:  Procedure Laterality Date  . ABDOMINAL HYSTERECTOMY  2005   did not remove ovaries  . CESAREAN SECTION  2000  . COLONOSCOPY    . CONVERSION TO TOTAL KNEE Left 01/13/2018   Procedure: Revision of Left knee unicompartmental arthroplasty to total knee arthroplasty;  Surgeon: [name], MD;  Location: [facility];  Service: Orthopedics;  Laterality: Left;  . FOOT ARTHRODESIS  1995   right  . KNEE ARTHROSCOPY WITH MEDIAL MENISECTOMY Left 05/17/2014   Procedure: LEFT ARTHROSCOPY KNEE WITH PARTIAL MEDIAL MENISECTOMY, CHONDROPLASTY OF LATERAL FEMORAL CONDYLE;  Surgeon: Alta Corning, MD;  Location: [facility];  Service: Orthopedics;  Laterality: Left;  . KNEE CLOSED REDUCTION Left 07/21/2018   Procedure: CLOSED MANIPULATION KNEE;  Surgeon: [name], MD;  Location: [facility];  Service: Orthopedics;  Laterality: Left;  56mn  . PARTIAL KNEE ARTHROPLASTY Left 01/21/2017   Procedure: UNICOMPARTMENTAL LEFT KNEE;   Surgeon: AGaynelle Arabian MD;  [facility];  Service: Orthopedics;  Laterality: Left;  . Revision left knee     Dr. AWynelle [id number]  . TOTAL KNEE ARTHROPLASTY Right 01/16/2020   Procedure: RIGHT TOTAL KNEE ARTHROPLASTY;  Surgeon: RFrederik Pear MD;  [facility];  Service: Orthopedics;  Laterality: Right;    There were no vitals filed for this visit.   Subjective Assessment - 03/01/20 0803    Subjective Has been trying to walk without her cane which worked out well yesterday. Still having a very small amount of knee buckling.    Pertinent History HLD, depression, GERD, L TKA with revision and closed manipulation 2018-2020    Diagnostic tests none recent    Patient Stated Goals get rid of pain    Currently in Pain? Yes    Pain Score 3     Pain Location Knee    Pain Orientation Right    Pain Descriptors / Indicators Sharp    Pain Type Acute pain;Surgical pain                             [facility] Adult PT Treatment/Exercise - 03/01/20 0001      Ambulation/Gait   Ambulation/Gait Yes    Ambulation/Gait Assistance 6: Modified independent (Device/Increase time)    Ambulation/Gait Assistance Details improved posture, R knee flexion, and more even step  length; good knee stability throughout    Ambulation Distance (Feet) 90 Feet    Assistive device None      Knee/Hip Exercises: Stretches   Hip Flexor Stretch Right;30 seconds;2 reps    Hip Flexor Stretch Limitations mod thomas + with strap       Knee/Hip Exercises: Aerobic   Recumbent Bike Lvl 1, 6 min - full revolutions    sitting on pillow     Knee/Hip Exercises: Standing   Lateral Step Up Right;10 reps;Hand Hold: 2;Step Height: 4";Hand Hold: 0    Lateral Step Up Limitations step downs with cueing to descend with control    Forward Step Up Right;2 sets;5 reps;Hand Hold: 1;Step Height: 4";Step Height: 6"    Forward Step Up Limitations cues to slow eccentric lower    Wall Squat 1 set;15 reps    Wall Squat  Limitations leaning on green p-ball    cues to shift to R LE   Other Standing Knee Exercises R foot step up, L foot back 8" x5   improved tolerance, decreased hesitation     Vasopneumatic   Number Minutes Vasopneumatic  10 minutes    Vasopnuematic Location  Knee   R   Vasopneumatic Pressure Low    Vasopneumatic Temperature  lowest temp      Manual Therapy   Manual Therapy Joint mobilization    Manual therapy comments sit    Joint Mobilization R patellar mobs M/L and superior/inferior directions grade 4 to tolerance- good motion; R knee flexion mobs with seat belt grade III/IV to tolerance                    PT Short Term Goals - 02/16/20 0938      PT SHORT TERM GOAL #1   Title Pt will be independent with initial HEP    Period Weeks    Status Achieved    Target Date 02/08/20             PT Long Term Goals - 02/28/20 0914      PT LONG TERM GOAL #1   Title Pt to be independent with advanced HEP    Time 8    Period Weeks    Status Partially Met   met for current     PT LONG TERM GOAL #2   Title Patient to demonstrate 0-120 degrees of AROM at R knee to improve gait and abilty to perform ADLs independently.    Time 8    Period Weeks    Status On-going   02/28/20: 2-106 degrees AROM, 2-108 degrees PROM     PT LONG TERM GOAL #3   Title Patient to demonstrate B LE strength >/=4+/5.    Time 8    Period Weeks    Status Partially Met   improved in R hip flexion and abduction, B knee extension, R knee flexion, and R ankle dorsiflexion strength     PT LONG TERM GOAL #4   Title Patient to demonstrate R SLR without quad lag.    Time 8    Period Weeks    Status On-going   demonstrating very slight quad lag     PT LONG TERM GOAL #5   Title Pt will demonstrate normal gait pattern with LRAD in order to improve functional mobility and decrease fall risk    Time 8    Period Weeks    Status On-going   still demonstrating lack of R TKE at IC, but improved  in speed and  fluidity of stepping with [facility]                Plan - 03/01/20 0843    Clinical Impression Statement Patient reports trying to walk without [facility] which worked out well yesterday. Notes that she is still having a very small amount of knee buckling with ambulation, but not to the point of falling. Upon observation of patient's gait pattern, patient now showing improved posture and R knee flexion, more even step length, and good knee stability throughout gait cycle. Advised patient to continue using SPC with long periods of walking, but okay to go without AD for shorter periods- patient agreeable. Patient initially apprehensive to knee flexion joint mobilizations today, but was actually able to tolerate this well. Followed with step up/down activities and LE strengthening with patient still intermittently demonstrating L weight shift and hesitancy to bend R knee upon step down. Ended session with [place] to R knee for post-exercise soreness. Patient without complaints at end of session.    Comorbidities HLD, depression, GERD, L TKA with revision and closed manipulation 2018-2020    Rehab Potential Good    PT Frequency 2x / week    PT Duration 8 weeks    PT Treatment/Interventions ADLs/Self Care Home Management;Cryotherapy;Electrical Stimulation;Moist Heat;Balance training;Therapeutic exercise;Therapeutic activities;Functional mobility training;Stair training;Gait training;Ultrasound;Neuromuscular re-education;Patient/family education;Manual techniques;Vasopneumatic Device;Taping;Energy conservation;Dry needling;Passive range of motion;Scar mobilization    PT Next Visit Plan Progress R knee ROM and strength, gait training with/without SPC    Consulted and Agree with Plan of Care Patient           Patient will benefit from skilled therapeutic intervention in order to improve the following deficits and impairments:  Hypomobility, Decreased endurance, Abnormal gait, Increased edema, Decreased scar  mobility, Decreased activity tolerance, Decreased strength, Increased fascial restricitons, Pain, Decreased balance, Difficulty walking, Increased muscle spasms, Improper body mechanics, Decreased range of motion, Impaired flexibility, Postural dysfunction  Visit Diagnosis: Acute pain of right knee  Stiffness of right knee, not elsewhere classified  Muscle weakness (generalized)  Other abnormalities of gait and mobility     Problem List Patient Active Problem List   Diagnosis Date Noted  . Status post right knee replacement 01/16/2020  . Osteoarthritis of right knee 01/13/2020  . Hormone replacement therapy (HRT) 03/03/2018  . S/P left unicompartmental knee replacement 01/13/2018  . Arthrofibrosis of total knee arthroplasty, subsequent encounter 01/21/2017  . Adjustment disorder with mixed anxiety and depressed mood 02/22/2016  . Mixed hyperlipidemia 08/22/2015  . Left knee pain 04/19/2014  . BRONCHITIS, ACUTE 07/21/2010     Janene Harvey, PT, DPT 03/01/20 8:56 AM   [facility] [address]  [place], [place], [address] Phone: [phone]   Fax:  [phone]  Name: Joanna Willis MRN: [id number] Date of Birth: [birth year]

## 2020-03-05 ENCOUNTER — Ambulatory Visit: Payer: 59

## 2020-03-05 ENCOUNTER — Other Ambulatory Visit: Payer: Self-pay

## 2020-03-05 DIAGNOSIS — M25561 Pain in right knee: Secondary | ICD-10-CM | POA: Diagnosis not present

## 2020-03-05 DIAGNOSIS — M6281 Muscle weakness (generalized): Secondary | ICD-10-CM

## 2020-03-05 DIAGNOSIS — R2689 Other abnormalities of gait and mobility: Secondary | ICD-10-CM | POA: Diagnosis not present

## 2020-03-05 DIAGNOSIS — M25661 Stiffness of right knee, not elsewhere classified: Secondary | ICD-10-CM

## 2020-03-05 NOTE — Therapy (Addendum)
[facility] [address]  [place], [place], [address] Phone: [phone]   Fax:  [phone]  Physical Therapy Treatment  Patient Details  Name: Joanna Willis MRN: [id number] Date of Birth: 04/21/1956 Referring Provider (PT): Frederik Pear, MD   Encounter Date: 03/05/2020   PT End of Session - 03/05/20 0819    Visit Number 15    Number of Visits 17    Date for PT Re-Evaluation 03/14/20    Authorization Type [facility]    PT Start Time 0801    PT Stop Time 1517    PT Time Calculation (min) 54 min    Activity Tolerance Patient tolerated treatment well    Behavior During Therapy [facility] for tasks assessed/performed           Past Medical History:  Diagnosis Date  . Allergy   . Arthritis   . Depression   . GERD (gastroesophageal reflux disease)   . Hypercholesteremia   . PONV (postoperative nausea and vomiting)   . Postmenopausal   . Wears glasses     Past Surgical History:  Procedure Laterality Date  . ABDOMINAL HYSTERECTOMY  2005   did not remove ovaries  . CESAREAN SECTION  2000  . COLONOSCOPY    . CONVERSION TO TOTAL KNEE Left 01/13/2018   Procedure: Revision of Left knee unicompartmental arthroplasty to total knee arthroplasty;  Surgeon: [name], MD;  Location: [facility];  Service: Orthopedics;  Laterality: Left;  . FOOT ARTHRODESIS  1995   right  . KNEE ARTHROSCOPY WITH MEDIAL MENISECTOMY Left 05/17/2014   Procedure: LEFT ARTHROSCOPY KNEE WITH PARTIAL MEDIAL MENISECTOMY, CHONDROPLASTY OF LATERAL FEMORAL CONDYLE;  Surgeon: Alta Corning, MD;  Location: [facility];  Service: Orthopedics;  Laterality: Left;  . KNEE CLOSED REDUCTION Left 07/21/2018   Procedure: CLOSED MANIPULATION KNEE;  Surgeon: [name], MD;  Location: [facility];  Service: Orthopedics;  Laterality: Left;  33mn  . PARTIAL KNEE ARTHROPLASTY Left 01/21/2017   Procedure: UNICOMPARTMENTAL LEFT KNEE;  Surgeon: AGaynelle Arabian MD;   Location: [facility];  Service: Orthopedics;  Laterality: Left;  . Revision left knee     Dr. AWynelle [id number]  . TOTAL KNEE ARTHROPLASTY Right 01/16/2020   Procedure: RIGHT TOTAL KNEE ARTHROPLASTY;  Surgeon: RFrederik Pear MD;  Location: [facility];  Service: Orthopedics;  Laterality: Right;    There were no vitals filed for this visit.   Subjective Assessment - 03/05/20 0810    Subjective Pt. reporting her brother-in-law passed away on F02-23-24    Pertinent History HLD, depression, GERD, L TKA with revision and closed manipulation 2018-2020    Diagnostic tests none recent    Patient Stated Goals get rid of pain    Currently in Pain? Yes    Pain Score 2     Pain Location Knee    Pain Orientation Right    Pain Descriptors / Indicators Sharp    Pain Type Acute pain;Surgical pain    Multiple Pain Sites No              [facility] PT Assessment - 03/05/20 0001      AROM   Right/Left Knee Right    Right Knee Flexion 113      PROM   Right/Left Knee Right    Right Knee Flexion 116                         [facility]  Adult PT Treatment/Exercise - 03/05/20 0001      Ambulation/Gait   Stairs Yes    Stairs Assistance 5: Supervision    Stairs Assistance Details (indicate cue type and reason) able to ascend/descend with light rail use with reciprocal and only min       Knee/Hip Exercises: Stretches   Knee: Self-Stretch Limitations 5" x 10 standing knee flexion stretch into TM platform     Gastroc Stretch Right;2 reps;30 seconds    Gastroc Stretch Limitations runners stretch       Knee/Hip Exercises: Aerobic   Recumbent Bike Lvl 1, 6 min - full revolutions       Knee/Hip Exercises: Machines for Strengthening   Cybex Leg Press B LEs: 25# x 15      Knee/Hip Exercises: Standing   Hip Flexion Right;Left;10 reps;Knee straight;Stengthening    Hip Flexion Limitations looped yellow TB at ankles     Hip Abduction Right;Left;10 reps;Knee straight    Abduction Limitations looped yellow TB  at ankles    Hip Extension Right;Left;10 reps;Knee straight    Extension Limitations looped yellow TB at ankles     Lateral Step Up Right;1 set;5 reps;Step Height: 8";Hand Hold: 1    Forward Step Up Right;1 set;5 reps;Step Height: 8";Hand Hold: 1    Functional Squat 1 set;10 reps   increased depth    Functional Squat Limitations TM rail       Knee/Hip Exercises: Supine   Bridges Both;15 reps;Strengthening      Vasopneumatic   Number Minutes Vasopneumatic  10 minutes    Vasopnuematic Location  Knee    Vasopneumatic Pressure Low    Vasopneumatic Temperature  lowest temp                  PT Education - 03/05/20 1507    Education Details HEP update; 3-way hip kicker with yellow TB    Person(s) Educated Patient    Methods Explanation;Demonstration;Verbal cues;Handout    Comprehension Returned demonstration;Verbal cues required;Verbalized understanding            PT Short Term Goals - 02/16/20 0938      PT SHORT TERM GOAL #1   Title Pt will be independent with initial HEP    Period Weeks    Status Achieved    Target Date 02/08/20             PT Long Term Goals - 02/28/20 0914      PT LONG TERM GOAL #1   Title Pt to be independent with advanced HEP    Time 8    Period Weeks    Status Partially Met   met for current     PT LONG TERM GOAL #2   Title Patient to demonstrate 0-120 degrees of AROM at R knee to improve gait and abilty to perform ADLs independently.    Time 8    Period Weeks    Status On-going   02/28/20: 2-106 degrees AROM, 2-108 degrees PROM     PT LONG TERM GOAL #3   Title Patient to demonstrate B LE strength >/=4+/5.    Time 8    Period Weeks    Status Partially Met   improved in R hip flexion and abduction, B knee extension, R knee flexion, and R ankle dorsiflexion strength     PT LONG TERM GOAL #4   Title Patient to demonstrate R SLR without quad lag.    Time 8    Period Weeks  Status On-going   demonstrating very slight quad lag      PT LONG TERM GOAL #5   Title Pt will demonstrate normal gait pattern with LRAD in order to improve functional mobility and decrease fall risk    Time 8    Period Weeks    Status On-going   still demonstrating lack of R TKE at IC, but improved in speed and fluidity of stepping with [facility]                Plan - 03/05/20 0826    Clinical Impression Statement Shalissa making good R knee ROM progress with physical therapy thus far.  Able to demo R knee AROM flexion progress to 113 dg, PROM flexion 116 dg today following flexibility therex.  Demonstrating greater comfort ambulating without AD and verbalizing improved R knee comfort overall since being prescribed/taking Meloxicam.  Ended visit with ice/compression to R knee to reduce post-exercise swelling.    Comorbidities HLD, depression, GERD, L TKA with revision and closed manipulation 2018-2020    Rehab Potential Good    PT Treatment/Interventions ADLs/Self Care Home Management;Cryotherapy;Electrical Stimulation;Moist Heat;Balance training;Therapeutic exercise;Therapeutic activities;Functional mobility training;Stair training;Gait training;Ultrasound;Neuromuscular re-education;Patient/family education;Manual techniques;Vasopneumatic Device;Taping;Energy conservation;Dry needling;Passive range of motion;Scar mobilization    PT Next Visit Plan Progress R knee ROM and strength, gait training with/without SPC    Consulted and Agree with Plan of Care Patient           Patient will benefit from skilled therapeutic intervention in order to improve the following deficits and impairments:  Hypomobility, Decreased endurance, Abnormal gait, Increased edema, Decreased scar mobility, Decreased activity tolerance, Decreased strength, Increased fascial restricitons, Pain, Decreased balance, Difficulty walking, Increased muscle spasms, Improper body mechanics, Decreased range of motion, Impaired flexibility, Postural dysfunction  Visit Diagnosis: Acute pain of  right knee  Stiffness of right knee, not elsewhere classified  Muscle weakness (generalized)  Other abnormalities of gait and mobility     Problem List Patient Active Problem List   Diagnosis Date Noted  . Status post right knee replacement 01/16/2020  . Osteoarthritis of right knee 01/13/2020  . Hormone replacement therapy (HRT) 03/03/2018  . S/P left unicompartmental knee replacement 01/13/2018  . Arthrofibrosis of total knee arthroplasty, subsequent encounter 01/21/2017  . Adjustment disorder with mixed anxiety and depressed mood 02/22/2016  . Mixed hyperlipidemia 08/22/2015  . Left knee pain 04/19/2014  . BRONCHITIS, ACUTE 07/21/2010    Bess Harvest, PTA 03/05/20 3:09 PM   [facility] [address]  [place], [place], [address] Phone: [phone]   Fax:  [phone]  Name: KARRY BARRILLEAUX MRN: [id number] Date of Birth: 02-08-1956

## 2020-03-07 ENCOUNTER — Encounter: Payer: 59 | Admitting: Physical Therapy

## 2020-03-08 ENCOUNTER — Ambulatory Visit: Payer: 59

## 2020-03-08 ENCOUNTER — Other Ambulatory Visit: Payer: Self-pay

## 2020-03-08 DIAGNOSIS — R2689 Other abnormalities of gait and mobility: Secondary | ICD-10-CM

## 2020-03-08 DIAGNOSIS — M25661 Stiffness of right knee, not elsewhere classified: Secondary | ICD-10-CM | POA: Diagnosis not present

## 2020-03-08 DIAGNOSIS — M6281 Muscle weakness (generalized): Secondary | ICD-10-CM

## 2020-03-08 DIAGNOSIS — M25561 Pain in right knee: Secondary | ICD-10-CM

## 2020-03-08 NOTE — Therapy (Signed)
[facility] [address]  [place], [place], [address] Phone: [phone]   Fax:  [phone]  Physical Therapy Treatment  Patient Details  Name: Joanna Willis MRN: [id number] Date of Birth: 08-09-1955 Referring Provider (PT): Frederik Pear, MD   Encounter Date: 03/08/2020   PT End of Session - 03/08/20 0810    Visit Number 16    Number of Visits 17    Date for PT Re-Evaluation 03/14/20    Authorization Type [facility]    PT Start Time 0806    PT Stop Time 9735    PT Time Calculation (min) 49 min    Activity Tolerance Patient tolerated treatment well    Behavior During Therapy [facility] for tasks assessed/performed           Past Medical History:  Diagnosis Date  . Allergy   . Arthritis   . Depression   . GERD (gastroesophageal reflux disease)   . Hypercholesteremia   . PONV (postoperative nausea and vomiting)   . Postmenopausal   . Wears glasses     Past Surgical History:  Procedure Laterality Date  . ABDOMINAL HYSTERECTOMY  2005   did not remove ovaries  . CESAREAN SECTION  2000  . COLONOSCOPY    . CONVERSION TO TOTAL KNEE Left 01/13/2018   Procedure: Revision of Left knee unicompartmental arthroplasty to total knee arthroplasty;  Surgeon: [name], MD;  Location[facility];  Service: Orthopedics;  Laterality: Left;  . FOOT ARTHRODESIS  1995   right  . KNEE ARTHROSCOPY WITH MEDIAL MENISECTOMY Left 05/17/2014   Procedure: LEFT ARTHROSCOPY KNEE WITH PARTIAL MEDIAL MENISECTOMY, CHONDROPLASTY OF LATERAL FEMORAL CONDYLE;  Surgeon: Alta Corning, MD;  Location: [facility];  Service: Orthopedics;  Laterality: Left;  . KNEE CLOSED REDUCTION Left 07/21/2018   Procedure: CLOSED MANIPULATION KNEE;  Surgeon: [name], MD;  Location: [facility];  Service: Orthopedics;  Laterality: Left;  60mn  . PARTIAL KNEE ARTHROPLASTY Left 01/21/2017   Procedure: UNICOMPARTMENTAL LEFT KNEE;  Surgeon: AGaynelle Arabian MD;   [facility];  Service: Orthopedics;  Laterality: Left;  . Revision left knee     Dr. AWynelle [id number]  . TOTAL KNEE ARTHROPLASTY Right 01/16/2020   Procedure: RIGHT TOTAL KNEE ARTHROPLASTY;  Surgeon: RFrederik Pear MD;  [facility];  Service: Orthopedics;  Laterality: Right;    There were no vitals filed for this visit.   Subjective Assessment - 03/08/20 0809    Subjective Pt. reporting R knee doing well today.    Patient is accompained by: Family member    Pertinent History HLD, depression, GERD, L TKA with revision and closed manipulation 2018-2020    Diagnostic tests none recent    Patient Stated Goals get rid of pain    Currently in Pain? Yes    Pain Score 2     Pain Location Knee    Pain Orientation Right    Pain Descriptors / Indicators Sharp    Pain Type Acute pain;Surgical pain    Multiple Pain Sites No              [facility] PT Assessment - 03/08/20 0001      AROM   Right/Left Knee Right    Right Knee Flexion 116      PROM   Right Knee Flexion 120  [facility] Adult PT Treatment/Exercise - 03/08/20 0001      Knee/Hip Exercises: Stretches   Pension scheme manager reps;30 seconds    Quad Stretch Limitations prone with strap and with/without folded pillow under knee    Piriformis Stretch Right;1 rep;30 seconds    Piriformis Stretch Limitations Manual with therapist     Other Knee/Hip Stretches Seated FROG groin stretch with UE pressure at knees 2 x 30 sec     Other Knee/Hip Stretches R supine groin stretch with strap x 30 sec       Knee/Hip Exercises: Aerobic   Recumbent Bike Lvl 1, 6 min - full revolutions       Knee/Hip Exercises: Machines for Strengthening   Cybex Leg Press B LEs: 35# x 15      Knee/Hip Exercises: Standing   Heel Raises Both;10 reps;1 set    Heel Raises Limitations B conc, R ecc; counter support     SLS with Vectors B SLS with oppo toe-touch to cones 2 x 15 cones each at counter       Knee/Hip  Exercises: Seated   Sit to Sand 10 reps;without UE support   from wooden box + airex pad      Knee/Hip Exercises: Supine   Bridges Both;15 reps;Strengthening    Bridges Limitations 3" hold       Vasopneumatic   Number Minutes Vasopneumatic  10 minutes    Vasopnuematic Location  Knee    Vasopneumatic Pressure Low    Vasopneumatic Temperature  lowest temp      Manual Therapy   Manual Therapy Joint mobilization;Soft tissue mobilization    Manual therapy comments supine, and mod thomas position     Joint Mobilization R knee flexion mobs with seat belt grade III/IV to tolerance    Soft tissue mobilization STM/strumming to R distal quad                     PT Short Term Goals - 02/16/20 0938      PT SHORT TERM GOAL #1   Title Pt will be independent with initial HEP    Period Weeks    Status Achieved    Target Date 02/08/20             PT Long Term Goals - 02/28/20 0914      PT LONG TERM GOAL #1   Title Pt to be independent with advanced HEP    Time 8    Period Weeks    Status Partially Met   met for current     PT LONG TERM GOAL #2   Title Patient to demonstrate 0-120 degrees of AROM at R knee to improve gait and abilty to perform ADLs independently.    Time 8    Period Weeks    Status On-going   02/28/20: 2-106 degrees AROM, 2-108 degrees PROM     PT LONG TERM GOAL #3   Title Patient to demonstrate B LE strength >/=4+/5.    Time 8    Period Weeks    Status Partially Met   improved in R hip flexion and abduction, B knee extension, R knee flexion, and R ankle dorsiflexion strength     PT LONG TERM GOAL #4   Title Patient to demonstrate R SLR without quad lag.    Time 8    Period Weeks    Status On-going   demonstrating very slight quad lag     PT LONG TERM GOAL #5  Title Pt will demonstrate normal gait pattern with LRAD in order to improve functional mobility and decrease fall risk    Time 8    Period Weeks    Status On-going   still demonstrating  lack of R TKE at IC, but improved in speed and fluidity of stepping with [facility]                Plan - 03/08/20 0811    Clinical Impression Statement Joanna Willis R knee pain minimal today.  Had her brother in-law's funeral a few days ago and had some difficulty balancing while walking across graveyard.  Progressed dynamic SLS activities today along with progression of leg press and low sit<>stand from wooden box for improved ability to weed garden and get up from gardening stool.  Pt. with intermittent "shots" of R superior knee pain in distal quad thus addressed this with LE stretching +roller stick massage.  Able to demonstrate 120 dg PROM flexion today after manual therapy.  R knee AROM flexion up to 116 dg.  Ended visit with ice/compression to R knee to reduce pain and tone.    Comorbidities HLD, depression, GERD, L TKA with revision and closed manipulation 2018-2020    Rehab Potential Good    PT Frequency 2x / week    PT Duration 8 weeks    PT Treatment/Interventions ADLs/Self Care Home Management;Cryotherapy;Electrical Stimulation;Moist Heat;Balance training;Therapeutic exercise;Therapeutic activities;Functional mobility training;Stair training;Gait training;Ultrasound;Neuromuscular re-education;Patient/family education;Manual techniques;Vasopneumatic Device;Taping;Energy conservation;Dry needling;Passive range of motion;Scar mobilization    PT Next Visit Plan Progress R knee ROM and strength, gait training with/without SPC    Consulted and Agree with Plan of Care Patient           Patient will benefit from skilled therapeutic intervention in order to improve the following deficits and impairments:  Hypomobility, Decreased endurance, Abnormal gait, Increased edema, Decreased scar mobility, Decreased activity tolerance, Decreased strength, Increased fascial restricitons, Pain, Decreased balance, Difficulty walking, Increased muscle spasms, Improper body mechanics, Decreased range of motion,  Impaired flexibility, Postural dysfunction  Visit Diagnosis: Acute pain of right knee  Stiffness of right knee, not elsewhere classified  Muscle weakness (generalized)  Other abnormalities of gait and mobility     Problem List Patient Active Problem List   Diagnosis Date Noted  . Status post right knee replacement 01/16/2020  . Osteoarthritis of right knee 01/13/2020  . Hormone replacement therapy (HRT) 03/03/2018  . S/P left unicompartmental knee replacement 01/13/2018  . Arthrofibrosis of total knee arthroplasty, subsequent encounter 01/21/2017  . Adjustment disorder with mixed anxiety and depressed mood 02/22/2016  . Mixed hyperlipidemia 08/22/2015  . Left knee pain 04/19/2014  . BRONCHITIS, ACUTE 07/21/2010    Joanna Willis, PTA 03/08/20 9:09 AM   [facility] [address]  [place], [place], [address] Phone: [phone]   Fax:  [phone]  Name: Joanna Willis MRN: [id number] Date of Birth: Apr 20, 1956

## 2020-03-12 ENCOUNTER — Other Ambulatory Visit: Payer: Self-pay

## 2020-03-12 ENCOUNTER — Ambulatory Visit: Payer: 59 | Admitting: Physical Therapy

## 2020-03-12 ENCOUNTER — Encounter: Payer: Self-pay | Admitting: Physical Therapy

## 2020-03-12 DIAGNOSIS — M6281 Muscle weakness (generalized): Secondary | ICD-10-CM

## 2020-03-12 DIAGNOSIS — R2689 Other abnormalities of gait and mobility: Secondary | ICD-10-CM

## 2020-03-12 DIAGNOSIS — M25561 Pain in right knee: Secondary | ICD-10-CM

## 2020-03-12 DIAGNOSIS — M25661 Stiffness of right knee, not elsewhere classified: Secondary | ICD-10-CM

## 2020-03-12 NOTE — Therapy (Signed)
[facility] [address]  [place], [place], [address] Phone: [phone]   Fax:  [phone]  Physical Therapy Treatment  Patient Details  Name: Joanna Willis MRN: [id number] Date of Birth: 1956/04/21 Referring Provider (PT): Frederik Pear, MD   Encounter Date: 03/12/2020   PT End of Session - 03/12/20 0939    Visit Number 17    Number of Visits 21    Date for PT Re-Evaluation 04/09/20    Authorization Type [facility]    PT Start Time 0801    PT Stop Time 0852    PT Time Calculation (min) 51 min    Activity Tolerance Patient tolerated treatment well    Behavior During Therapy [facility] for tasks assessed/performed           Past Medical History:  Diagnosis Date  . Allergy   . Arthritis   . Depression   . GERD (gastroesophageal reflux disease)   . Hypercholesteremia   . PONV (postoperative nausea and vomiting)   . Postmenopausal   . Wears glasses     Past Surgical History:  Procedure Laterality Date  . ABDOMINAL HYSTERECTOMY  2005   did not remove ovaries  . CESAREAN SECTION  2000  . COLONOSCOPY    . CONVERSION TO TOTAL KNEE Left 01/13/2018   Procedure: Revision of Left knee unicompartmental arthroplasty to total knee arthroplasty;  Surgeon: [name], MD;  Location: [facility];  Service: Orthopedics;  Laterality: Left;  . FOOT ARTHRODESIS  1995   right  . KNEE ARTHROSCOPY WITH MEDIAL MENISECTOMY Left 05/17/2014   Procedure: LEFT ARTHROSCOPY KNEE WITH PARTIAL MEDIAL MENISECTOMY, CHONDROPLASTY OF LATERAL FEMORAL CONDYLE;  Surgeon: Alta Corning, MD;  Location: [facility];  Service: Orthopedics;  Laterality: Left;  . KNEE CLOSED REDUCTION Left 07/21/2018   Procedure: CLOSED MANIPULATION KNEE;  Surgeon: [name], MD;  Location: [facility];  Service: Orthopedics;  Laterality: Left;  18mn  . PARTIAL KNEE ARTHROPLASTY Left 01/21/2017   Procedure: UNICOMPARTMENTAL LEFT KNEE;  Surgeon: AGaynelle Arabian MD;   Location: [facility];  Service: Orthopedics;  Laterality: Left;  . Revision left knee     Dr. AWynelle [id number]  . TOTAL KNEE ARTHROPLASTY Right 01/16/2020   Procedure: RIGHT TOTAL KNEE ARTHROPLASTY;  Surgeon: RFrederik Pear MD;  Location: [facility];  Service: Orthopedics;  Laterality: Right;    There were no vitals filed for this visit.   Subjective Assessment - 03/12/20 0802    Subjective Knee is feeling pretty good today. Worked in the garden over the weekend and didn't have any lasting pain. Reports that new pain medication has made all the difference. Reports 75% improvement since surgery.    Pertinent History HLD, depression, GERD, L TKA with revision and closed manipulation 2018-2020    Diagnostic tests none recent    Patient Stated Goals get rid of pain    Currently in Pain? No/denies              [facility] - 03/12/20 0001      Assessment   Medical Diagnosis s/p R TKA    Referring Provider (PT) FFrederik Pear MD    Onset Date/Surgical Date 01/16/20      Observation/Other Assessments   Focus on Therapeutic Outcomes (FOTO)  knee: 56% (44 % limitation)      AROM   Right Knee Extension 0    Right Knee Flexion 114      PROM   Right  Knee Extension 0    Right Knee Flexion 120      Strength   Right Hip Flexion 4+/5    Right Hip ABduction 4+/5    Right Hip ADduction 4+/5    Left Hip Flexion 4+/5    Left Hip ABduction 4+/5    Left Hip ADduction 4+/5    Right Knee Flexion 4+/5   mild medial knee pain   Right Knee Extension 4+/5    Left Knee Flexion 4+/5    Left Knee Extension 5/5    Right Ankle Dorsiflexion 4+/5    Right Ankle Plantar Flexion 4+/5    Left Ankle Dorsiflexion 4+/5    Left Ankle Plantar Flexion 4+/5                         [facility] Adult PT Treatment/Exercise - 03/12/20 0001      Ambulation/Gait   Ambulation/Gait Yes    Ambulation/Gait Assistance 6: Modified independent (Device/Increase time)    Ambulation Distance (Feet) 100 Feet     Assistive device None    Gait Pattern Step-through pattern;Trunk flexed;Lateral trunk lean to left    Stairs Yes    Stairs Assistance 7: Independent    Number of Stairs 13    Height of Stairs 8    Gait Comments reciprocal ascending and descending pattern with decreased eccentric control R>L       Knee/Hip Exercises: Stretches   Passive Hamstring Stretch Right;30 seconds;2 reps    Passive Hamstring Stretch Limitations supine with strap    Hip Flexor Stretch Right;30 seconds;2 reps    Hip Flexor Stretch Limitations mod thomas + with strap       Knee/Hip Exercises: Aerobic   Recumbent Bike Lvl 1, 6 min - full revolutions       Knee/Hip Exercises: Standing   Terminal Knee Extension Strengthening;Right;1 set;10 reps;Theraband    Theraband Level (Terminal Knee Extension) Level 4 (Blue)    Terminal Knee Extension Limitations squat + TKE 2 sec    Forward Step Up Right;1 set;10 reps;Hand Hold: 1;Step Height: 6"    Forward Step Up Limitations heel touch      Knee/Hip Exercises: Supine   Straight Leg Raises Strengthening;Right;1 set;10 reps    Straight Leg Raises Limitations barely noticeable quad lag                  PT Education - 03/12/20 0939    Education Details discussion on objective progress and remaining impairments; update/consolidation of HEP    Person(s) Educated Patient    Methods Explanation;Demonstration;Tactile cues;Verbal cues;Handout    Comprehension Verbalized understanding;Returned demonstration            PT Short Term Goals - 03/12/20 0803      PT SHORT TERM GOAL #1   Title Pt will be independent with initial HEP    Period Weeks    Status Achieved    Target Date 02/08/20             PT Long Term Goals - 03/12/20 0803      PT LONG TERM GOAL #1   Title Pt to be independent with advanced HEP    Time 4    Period Weeks    Status Partially Met   met for current   Target Date 04/09/20      PT LONG TERM GOAL #2   Title Patient to demonstrate  0-120 degrees of AROM at R knee to improve gait and  abilty to perform ADLs independently.    Time 8    Period Weeks    Status Achieved      PT LONG TERM GOAL #3   Title Patient to demonstrate B LE strength >/=4+/5.    Time 8    Period Weeks    Status Achieved      PT LONG TERM GOAL #4   Title Patient to demonstrate R SLR without quad lag.    Time 4    Period Weeks    Status On-going   demonstrating very slight quad lag   Target Date 04/09/20      PT LONG TERM GOAL #5   Title Pt will demonstrate normal gait pattern with LRAD in order to improve functional mobility and decrease fall risk    Time 8    Period Weeks    Status Partially Met   stil demonstrating flexed posture with L lateral lean; very small antalgia still remaining on R LE     Additional Long Term Goals   Additional Long Term Goals Yes      PT LONG TERM GOAL #6   Title Patient to demonstrate alternating reciprocal stair climbing pattern with good eccentric control on R LE.    Time 4    Period Weeks    Status New    Target Date 04/09/20                 Plan - 03/12/20 0955    Clinical Impression Statement Patient reporting 75% improvement since surgery. Noting good improvement in pain levels since starting on her new pain medication and was able to work in her garden without issues over the weekend. Also reporting improved sleeping and standing tolerance. Does note some remaining difficulty descending stairs. Patient now demonstrating R knee AROM 0-114 degrees, 0-120 PROM degrees.  Strength goal has been met at this time. However, patient still demonstrating very minor quad lag with SLR, indicating weakness. Patient is demonstrating improved TKE on R LE throughout gait cycle, but still demonstrating flexed posture with L lateral lean and very small antalgia still remaining on R LE. Updated goals with stair goal to continue addressing tasks that still challenge patient. Updated and consolidated HEP with quad  strengthening exercises- patient reported understanding and without complaints at end of session. Patient is demonstrating great progress towards goals. Would benefit from continued skilled PT services 1x/week for 4 weeks to address remaining goals and return to work.    Comorbidities HLD, depression, GERD, L TKA with revision and closed manipulation 2018-2020    Rehab Potential Good    PT Frequency 1x / week    PT Duration 4 weeks    PT Treatment/Interventions ADLs/Self Care Home Management;Cryotherapy;Electrical Stimulation;Moist Heat;Balance training;Therapeutic exercise;Therapeutic activities;Functional mobility training;Stair training;Gait training;Ultrasound;Neuromuscular re-education;Patient/family education;Manual techniques;Vasopneumatic Device;Taping;Energy conservation;Dry needling;Passive range of motion;Scar mobilization    PT Next Visit Plan stair training, quad/HS strengthening    Consulted and Agree with Plan of Care Patient           Patient will benefit from skilled therapeutic intervention in order to improve the following deficits and impairments:  Hypomobility, Decreased endurance, Abnormal gait, Increased edema, Decreased scar mobility, Decreased activity tolerance, Decreased strength, Increased fascial restricitons, Pain, Decreased balance, Difficulty walking, Increased muscle spasms, Improper body mechanics, Decreased range of motion, Impaired flexibility, Postural dysfunction  Visit Diagnosis: Acute pain of right knee  Stiffness of right knee, not elsewhere classified  Muscle weakness (generalized)  Other abnormalities of gait and mobility  Problem List Patient Active Problem List   Diagnosis Date Noted  . Status post right knee replacement 01/16/2020  . Osteoarthritis of right knee 01/13/2020  . Hormone replacement therapy (HRT) 03/03/2018  . S/P left unicompartmental knee replacement 01/13/2018  . Arthrofibrosis of total knee arthroplasty, subsequent  encounter 01/21/2017  . Adjustment disorder with mixed anxiety and depressed mood 02/22/2016  . Mixed hyperlipidemia 08/22/2015  . Left knee pain 04/19/2014  . BRONCHITIS, ACUTE 07/21/2010    Janene Harvey, PT, DPT 03/12/20 9:57 AM   [facility] [address]  [place], [place], [address] Phone: [phone]   Fax:  [phone]  Name: Joanna Willis MRN: [id number] Date of Birth: 07-12-1955

## 2020-03-19 MED FILL — MELOXICAM 15 MG TABLET: 15 | 90 days supply | Qty: 90 | Fill #1

## 2020-03-20 ENCOUNTER — Other Ambulatory Visit: Payer: Self-pay

## 2020-03-20 ENCOUNTER — Ambulatory Visit: Payer: 59

## 2020-03-20 DIAGNOSIS — M25561 Pain in right knee: Secondary | ICD-10-CM | POA: Diagnosis not present

## 2020-03-20 DIAGNOSIS — M25661 Stiffness of right knee, not elsewhere classified: Secondary | ICD-10-CM | POA: Diagnosis not present

## 2020-03-20 DIAGNOSIS — R2689 Other abnormalities of gait and mobility: Secondary | ICD-10-CM

## 2020-03-20 DIAGNOSIS — M6281 Muscle weakness (generalized): Secondary | ICD-10-CM

## 2020-03-20 NOTE — Therapy (Signed)
[facility] [address]  [place], [place], [address] Phone: [phone]   Fax:  [phone]  Physical Therapy Treatment  Patient Details  Name: Joanna Willis MRN: [id number] Date of Birth: [birth year] Referring Provider (PT): Frederik Pear, MD   Encounter Date: 03/20/2020   PT End of Session - 03/20/20 0852    Visit Number 18    Number of Visits 21    Date for PT Re-Evaluation 04/09/20    Authorization Type [facility]    PT Start Time 0845    PT Stop Time 0927    PT Time Calculation (min) 42 min    Activity Tolerance Patient tolerated treatment well    Behavior During Therapy [facility] for tasks assessed/performed           Past Medical History:  Diagnosis Date   Allergy    Arthritis    Depression    GERD (gastroesophageal reflux disease)    Hypercholesteremia    PONV (postoperative nausea and vomiting)    Postmenopausal    Wears glasses     Past Surgical History:  Procedure Laterality Date   ABDOMINAL HYSTERECTOMY  2005   did not remove ovaries   CESAREAN SECTION  2000   COLONOSCOPY     CONVERSION TO TOTAL KNEE Left 01/13/2018   Procedure: Revision of Left knee unicompartmental arthroplasty to total knee arthroplasty;  Surgeon: [name], MD;  Location: [facility];  Service: Orthopedics;  Laterality: Left;   FOOT ARTHRODESIS  1995   right   KNEE ARTHROSCOPY WITH MEDIAL MENISECTOMY Left 05/17/2014   Procedure: LEFT ARTHROSCOPY KNEE WITH PARTIAL MEDIAL MENISECTOMY, CHONDROPLASTY OF LATERAL FEMORAL CONDYLE;  Surgeon: Alta Corning, MD;  Location: [facility];  Service: Orthopedics;  Laterality: Left;   KNEE CLOSED REDUCTION Left 07/21/2018   Procedure: CLOSED MANIPULATION KNEE;  Surgeon: [name], MD;  Location: [facility];  Service: Orthopedics;  Laterality: Left;  47mn   PARTIAL KNEE ARTHROPLASTY Left 01/21/2017   Procedure: UNICOMPARTMENTAL LEFT KNEE;  Surgeon: AGaynelle Arabian MD;   Location[facility];  Service: Orthopedics;  Laterality: Left;   Revision left knee     Dr. AWynelle [id number]   TOTAL KNEE ARTHROPLASTY Right 01/16/2020   Procedure: RIGHT TOTAL KNEE ARTHROPLASTY;  Surgeon: RFrederik Pear MD;  Location[facility];  Service: Orthopedics;  Laterality: Right;    There were no vitals filed for this visit.   Subjective Assessment - 03/20/20 0852    Subjective Doing ok.    Pertinent History HLD, depression, GERD, L TKA with revision and closed manipulation 2018-2020    Diagnostic tests none recent    Patient Stated Goals get rid of pain    Currently in Pain? No/denies    Pain Score 0-No pain    Pain Location Knee    Pain Orientation Right                             [facility] Adult PT Treatment/Exercise - 03/20/20 0001      Transfers   Comments standing to kneeling/floor transfer with use of mat table UE support and red mat       Ambulation/Gait   Ambulation/Gait Yes    Ambulation/Gait Assistance 6: Modified independent (Device/Increase time)    Ambulation Distance (Feet) 200 Feet    Assistive device None    Gait Pattern Step-through pattern;Trunk flexed;Lateral trunk lean to left    Stairs  Yes    Stairs Assistance 7: Independent    Stairs Assistance Details (indicate cue type and reason) visible improvement in quad control descending stairs     Number of Stairs 13    Height of Stairs 8    Pre-Gait Activities worked on fast walking for improved swing phase hip/knee flexion      Knee/Hip Exercises: Stretches   Hip Flexor Stretch Right;30 seconds;2 reps    Hip Flexor Stretch Limitations mod thomas + with strap     ITB Stretch Right;1 rep;30 seconds    ITB Stretch Limitations supine with strap       Knee/Hip Exercises: Aerobic   Recumbent Bike Lvl 1, 6 min - full revolutions       Knee/Hip Exercises: Standing   Heel Raises Both;1 set;15 reps    Heel Raises Limitations B conc, R ecc; counter support     Step Down Right;10 reps;Step  Height: 6";Hand Hold: 2    Step Down Limitations step-down       Knee/Hip Exercises: Seated   Sit to Sand 10 reps;without UE support   from low wooden box + airex pad                    PT Short Term Goals - 03/12/20 0803      PT SHORT TERM GOAL #1   Title Pt will be independent with initial HEP    Period Weeks    Status Achieved    Target Date 02/08/20             PT Long Term Goals - 03/12/20 0803      PT LONG TERM GOAL #1   Title Pt to be independent with advanced HEP    Time 4    Period Weeks    Status Partially Met   met for current   Target Date 04/09/20      PT LONG TERM GOAL #2   Title Patient to demonstrate 0-120 degrees of AROM at R knee to improve gait and abilty to perform ADLs independently.    Time 8    Period Weeks    Status Achieved      PT LONG TERM GOAL #3   Title Patient to demonstrate B LE strength >/=4+/5.    Time 8    Period Weeks    Status Achieved      PT LONG TERM GOAL #4   Title Patient to demonstrate R SLR without quad lag.    Time 4    Period Weeks    Status On-going   demonstrating very slight quad lag   Target Date 04/09/20      PT LONG TERM GOAL #5   Title Pt will demonstrate normal gait pattern with LRAD in order to improve functional mobility and decrease fall risk    Time 8    Period Weeks    Status Partially Met   stil demonstrating flexed posture with L lateral lean; very small antalgia still remaining on R LE     Additional Long Term Goals   Additional Long Term Goals Yes      PT LONG TERM GOAL #6   Title Patient to demonstrate alternating reciprocal stair climbing pattern with good eccentric control on R LE.    Time 4    Period Weeks    Status New    Target Date 04/09/20                 Plan -  03/20/20 0910    Clinical Impression Statement Joanna Willis doing well.  Wishes to finish up with physical therapy over the next two visits.  Demonstrating improved quad/hip stability descending stairs today  progressing toward LTG #6.  Progressed step training focusing on eccentric control and did review getting up and down from floor while maintaining TKA precautions with use of mat.  Pt. verbalizing improved understanding of safe mechanics with floor transfers and leaving session pain free.    Comorbidities HLD, depression, GERD, L TKA with revision and closed manipulation 2018-2020    Rehab Potential Good    PT Frequency 1x / week    PT Duration 4 weeks    PT Treatment/Interventions ADLs/Self Care Home Management;Cryotherapy;Electrical Stimulation;Moist Heat;Balance training;Therapeutic exercise;Therapeutic activities;Functional mobility training;Stair training;Gait training;Ultrasound;Neuromuscular re-education;Patient/family education;Manual techniques;Vasopneumatic Device;Taping;Energy conservation;Dry needling;Passive range of motion;Scar mobilization    PT Next Visit Plan stair training, quad/HS strengthening    Consulted and Agree with Plan of Care Patient           Patient will benefit from skilled therapeutic intervention in order to improve the following deficits and impairments:  Hypomobility, Decreased endurance, Abnormal gait, Increased edema, Decreased scar mobility, Decreased activity tolerance, Decreased strength, Increased fascial restricitons, Pain, Decreased balance, Difficulty walking, Increased muscle spasms, Improper body mechanics, Decreased range of motion, Impaired flexibility, Postural dysfunction  Visit Diagnosis: Acute pain of right knee  Stiffness of right knee, not elsewhere classified  Muscle weakness (generalized)  Other abnormalities of gait and mobility     Problem List Patient Active Problem List   Diagnosis Date Noted   Status post right knee replacement 01/16/2020   Osteoarthritis of right knee 01/13/2020   Hormone replacement therapy (HRT) 03/03/2018   S/P left unicompartmental knee replacement 01/13/2018   Arthrofibrosis of total knee  arthroplasty, subsequent encounter 01/21/2017   Adjustment disorder with mixed anxiety and depressed mood 02/22/2016   Mixed hyperlipidemia 08/22/2015   Left knee pain 04/19/2014   BRONCHITIS, ACUTE 07/21/2010    Bess Harvest, PTA 03/20/20 12:34 PM   [facility] [address]  [place], [place], [address] Phone: [phone]   Fax:  [phone]  Name: Joanna Willis MRN: [id number] Date of Birth: [birth year]

## 2020-03-28 ENCOUNTER — Ambulatory Visit: Payer: 59

## 2020-04-04 ENCOUNTER — Ambulatory Visit: Payer: 59 | Attending: Orthopedic Surgery | Admitting: Physical Therapy

## 2020-04-04 ENCOUNTER — Other Ambulatory Visit: Payer: Self-pay

## 2020-04-04 ENCOUNTER — Encounter: Payer: Self-pay | Admitting: Physical Therapy

## 2020-04-04 DIAGNOSIS — R2689 Other abnormalities of gait and mobility: Secondary | ICD-10-CM | POA: Diagnosis not present

## 2020-04-04 DIAGNOSIS — M6281 Muscle weakness (generalized): Secondary | ICD-10-CM | POA: Diagnosis not present

## 2020-04-04 DIAGNOSIS — M25661 Stiffness of right knee, not elsewhere classified: Secondary | ICD-10-CM | POA: Insufficient documentation

## 2020-04-04 DIAGNOSIS — M25561 Pain in right knee: Secondary | ICD-10-CM | POA: Insufficient documentation

## 2020-04-04 NOTE — Therapy (Signed)
[facility] [address]  [place], [place], [address] Phone: [phone]   Fax:  [phone]  Physical Therapy Discharge Summary  Patient Details  Name: Joanna Willis MRN: [id number] Date of Birth: 1956-02-07 Referring Provider (PT): Frederik Pear, MD   Encounter Date: 04/04/2020   PT End of Session - 04/04/20 0916    Visit Number 19    Number of Visits 21    Date for PT Re-Evaluation 04/09/20    Authorization Type [facility]    PT Start Time 0848    PT Stop Time 0914    PT Time Calculation (min) 26 min    Activity Tolerance Patient tolerated treatment well    Behavior During Therapy [facility] for tasks assessed/performed           Past Medical History:  Diagnosis Date  . Allergy   . Arthritis   . Depression   . GERD (gastroesophageal reflux disease)   . Hypercholesteremia   . PONV (postoperative nausea and vomiting)   . Postmenopausal   . Wears glasses     Past Surgical History:  Procedure Laterality Date  . ABDOMINAL HYSTERECTOMY  2005   did not remove ovaries  . CESAREAN SECTION  2000  . COLONOSCOPY    . CONVERSION TO TOTAL KNEE Left 01/13/2018   Procedure: Revision of Left knee unicompartmental arthroplasty to total knee arthroplasty;  Surgeon: [name], MD;  Location: [facility];  Service: Orthopedics;  Laterality: Left;  . FOOT ARTHRODESIS  1995   right  . KNEE ARTHROSCOPY WITH MEDIAL MENISECTOMY Left 05/17/2014   Procedure: LEFT ARTHROSCOPY KNEE WITH PARTIAL MEDIAL MENISECTOMY, CHONDROPLASTY OF LATERAL FEMORAL CONDYLE;  Surgeon: Alta Corning, MD;  Location: [facility];  Service: Orthopedics;  Laterality: Left;  . KNEE CLOSED REDUCTION Left 07/21/2018   Procedure: CLOSED MANIPULATION KNEE;  Surgeon: [name], MD;  Location: [facility];  Service: Orthopedics;  Laterality: Left;  73mn  . PARTIAL KNEE ARTHROPLASTY Left 01/21/2017   Procedure: UNICOMPARTMENTAL LEFT KNEE;  Surgeon: AGaynelle Arabian MD;  Location: [facility];  Service: Orthopedics;  Laterality: Left;  . Revision left knee     Dr. AWynelle [id number]  . TOTAL KNEE ARTHROPLASTY Right 01/16/2020   Procedure: RIGHT TOTAL KNEE ARTHROPLASTY;  Surgeon: RFrederik Pear MD;  Location: [facility];  Service: Orthopedics;  Laterality: Right;    There were no vitals filed for this visit.   Subjective Assessment - 04/04/20 0849    Subjective Will be seeing her MD tomorrow and anticipates being released to work. Reporting 85-90% improvement in R knee since intial eval. Still has not returned to ride a bike, but feels that she will be able to complete her job requirements without much issue.    Pertinent History HLD, depression, GERD, L TKA with revision and closed manipulation 2018-2020    Diagnostic tests none recent    Patient Stated Goals get rid of pain    Currently in Pain? No/denies              [facility]      Assessment   Medical Diagnosis s/p R TKA    Referring Provider (PT) FFrederik Pear MD    Onset Date/Surgical Date 01/16/20      Observation/Other Assessments   Focus on Therapeutic Outcomes (FOTO)  knee: 69% (31 % limitation)      AROM   Overall AROM Comments ROM carried over from 09/28  Right Knee Extension 0    Right Knee Flexion 114      PROM   Right Knee Extension 0    Right Knee Flexion 120                         [facility] Adult PT Treatment/Exercise - 04/04/20 0001      Ambulation/Gait   Ambulation/Gait Yes    Ambulation/Gait Assistance 7: Independent    Ambulation Distance (Feet) 90 Feet    Assistive device None    Gait Pattern Step-through pattern;Trunk flexed   much improved continuity of stepping   Stairs Yes    Stairs Assistance 7: Independent    Number of Stairs 13    Height of Stairs 8    Gait Comments much improved continuity of stepping when ascending and descending- only mild eccentric quad instability remianing B      Knee/Hip Exercises: Aerobic     Recumbent Bike Lvl 1, 6 min - full revolutions       Knee/Hip Exercises: Supine   Straight Leg Raises Strengthening;Right;1 set;10 reps    Straight Leg Raises Limitations barely noticeable quad lag                  PT Education - 04/04/20 0914    Education Details discussion on objective progress and remaining impairments; verbal review of previous HEP handout; encouraged patient to frequently change positions while at work and utilize garden stool when gardening to improve tolerance for these activities    Person(s) Educated Patient    Methods Explanation    Comprehension Verbalized understanding            PT Short Term Goals - 04/04/20 0852      PT SHORT TERM GOAL #1   Title Pt will be independent with initial HEP    Period Weeks    Status Achieved    Target Date 02/08/20             PT Long Term Goals - 04/04/20 0853      PT LONG TERM GOAL #1   Title Pt to be independent with advanced HEP    Time 4    Period Weeks    Status Achieved      PT LONG TERM GOAL #2   Title Patient to demonstrate 0-120 degrees of AROM at R knee to improve gait and abilty to perform ADLs independently.    Time 8    Period Weeks    Status Achieved      PT LONG TERM GOAL #3   Title Patient to demonstrate B LE strength >/=4+/5.    Time 8    Period Weeks    Status Achieved      PT LONG TERM GOAL #4   Title Patient to demonstrate R SLR without quad lag.    Time 4    Period Weeks    Status Partially Met   demonstrating very slight quad lag     PT LONG TERM GOAL #5   Title Pt will demonstrate normal gait pattern with LRAD in order to improve functional mobility and decrease fall risk    Time 8    Period Weeks    Status Achieved      PT LONG TERM GOAL #6   Title Patient to demonstrate alternating reciprocal stair climbing pattern with good eccentric control on R LE.    Time 4    Period Weeks  Status Partially Met   good stability on R LE when ascending, very mild  eccentric instability descending                Plan - 04/04/20 0917    Clinical Impression Statement Patient reporting 85-90% improvement in R knee since initial eval. Still has not returned to ride a bike, but feels that she will be able to complete her job requirements without much issue and anticipates being released to work soon. Patient still demonstrates a very mild quad lag with SLRs. Requested to skip ROM measurements today, as patient has already met this goal. Gait pattern today demonstrates much improved fluidity and continuity of stepping, with near full resolution of antalgia. Stair climbing has also improved, as patient is now demonstrating only mild eccentric B quad instability remaining when descending. Reviewed HEP, which patient reported good understanding of. Discussed activity modifications at work and when gardening to improve functional activity tolerance. Patient reported understanding of all edu provided today. Now ready for D/C d/t progress with therapy.    Comorbidities HLD, depression, GERD, L TKA with revision and closed manipulation 2018-2020    Rehab Potential Good    PT Frequency 1x / week    PT Duration 4 weeks    PT Treatment/Interventions ADLs/Self Care Home Management;Cryotherapy;Electrical Stimulation;Moist Heat;Balance training;Therapeutic exercise;Therapeutic activities;Functional mobility training;Stair training;Gait training;Ultrasound;Neuromuscular re-education;Patient/family education;Manual techniques;Vasopneumatic Device;Taping;Energy conservation;Dry needling;Passive range of motion;Scar mobilization    PT Next Visit Plan DC at this time    Consulted and Agree with Plan of Care Patient           Patient will benefit from skilled therapeutic intervention in order to improve the following deficits and impairments:  Hypomobility, Decreased endurance, Abnormal gait, Increased edema, Decreased scar mobility, Decreased activity tolerance, Decreased  strength, Increased fascial restricitons, Pain, Decreased balance, Difficulty walking, Increased muscle spasms, Improper body mechanics, Decreased range of motion, Impaired flexibility, Postural dysfunction  Visit Diagnosis: Acute pain of right knee  Stiffness of right knee, not elsewhere classified  Muscle weakness (generalized)  Other abnormalities of gait and mobility     Problem List Patient Active Problem List   Diagnosis Date Noted  . Status post right knee replacement 01/16/2020  . Osteoarthritis of right knee 01/13/2020  . Hormone replacement therapy (HRT) 03/03/2018  . S/P left unicompartmental knee replacement 01/13/2018  . Arthrofibrosis of total knee arthroplasty, subsequent encounter 01/21/2017  . Adjustment disorder with mixed anxiety and depressed mood 02/22/2016  . Mixed hyperlipidemia 08/22/2015  . Left knee pain 04/19/2014  . BRONCHITIS, ACUTE 07/21/2010     PHYSICAL THERAPY DISCHARGE SUMMARY  Visits from Start of Care: 19  Current functional level related to goals / functional outcomes: See above clinical impression   Remaining deficits: Decreased eccentric control with stairs, quad lag with SLR   Education / Equipment: HEP  Plan: Patient agrees to discharge.  Patient goals were partially met. Patient is being discharged due to meeting the stated rehab goals.  ?????     Janene Harvey, PT, DPT 04/04/20 9:23 AM   [facility] [address]  [place], [place], [address] Phone: [phone]   Fax:  [phone]  Name: MARCENA DIAS MRN: [id number] Date of Birth: 01-24-1956

## 2020-04-05 DIAGNOSIS — Z9889 Other specified postprocedural states: Secondary | ICD-10-CM | POA: Diagnosis not present

## 2020-04-09 ENCOUNTER — Ambulatory Visit: Payer: 59 | Admitting: Physical Therapy

## 2020-04-20 ENCOUNTER — Other Ambulatory Visit: Payer: Self-pay

## 2020-04-20 ENCOUNTER — Ambulatory Visit (INDEPENDENT_AMBULATORY_CARE_PROVIDER_SITE_OTHER): Payer: 59 | Admitting: Medical-Surgical

## 2020-04-20 ENCOUNTER — Encounter: Payer: Self-pay | Admitting: Medical-Surgical

## 2020-04-20 VITALS — BP 134/86 | HR 64 | Temp 98.0°F | Ht 65.25 in | Wt 150.9 lb

## 2020-04-20 DIAGNOSIS — Z1329 Encounter for screening for other suspected endocrine disorder: Secondary | ICD-10-CM | POA: Diagnosis not present

## 2020-04-20 DIAGNOSIS — Z7689 Persons encountering health services in other specified circumstances: Secondary | ICD-10-CM

## 2020-04-20 DIAGNOSIS — Z0000 Encounter for general adult medical examination without abnormal findings: Secondary | ICD-10-CM

## 2020-04-20 NOTE — Patient Instructions (Signed)
Preventive Care 40-64 Years Old, Female °Preventive care refers to visits with your health care provider and lifestyle choices that can promote health and wellness. This includes: °· A yearly physical exam. This may also be called an annual well check. °· Regular dental visits and eye exams. °· Immunizations. °· Screening for certain conditions. °· Healthy lifestyle choices, such as eating a healthy diet, getting regular exercise, not using drugs or products that contain nicotine and tobacco, and limiting alcohol use. °What can I expect for my preventive care visit? °Physical exam °Your health care provider will check your: °· Height and weight. This may be used to calculate body mass index (BMI), which tells if you are at a healthy weight. °· Heart rate and blood pressure. °· Skin for abnormal spots. °Counseling °Your health care provider may ask you questions about your: °· Alcohol, tobacco, and drug use. °· Emotional well-being. °· Home and relationship well-being. °· Sexual activity. °· Eating habits. °· Work and work environment. °· Method of birth control. °· Menstrual cycle. °· Pregnancy history. °What immunizations do I need? ° °Influenza (flu) vaccine °· This is recommended every year. °Tetanus, diphtheria, and pertussis (Tdap) vaccine °· You may need a Td booster every 10 years. °Varicella (chickenpox) vaccine °· You may need this if you have not been vaccinated. °Zoster (shingles) vaccine °· You may need this after age 60. °Measles, mumps, and rubella (MMR) vaccine °· You may need at least one dose of MMR if you were born in 1956 or later. You may also need a second dose. °Pneumococcal conjugate (PCV13) vaccine °· You may need this if you have certain conditions and were not previously vaccinated. °Pneumococcal polysaccharide (PPSV23) vaccine °· You may need one or two doses if you smoke cigarettes or if you have certain conditions. °Meningococcal conjugate (MenACWY) vaccine °· You may need this if you  have certain conditions. °Hepatitis A vaccine °· You may need this if you have certain conditions or if you travel or work in places where you may be exposed to hepatitis A. °Hepatitis B vaccine °· You may need this if you have certain conditions or if you travel or work in places where you may be exposed to hepatitis B. °Haemophilus influenzae type b (Hib) vaccine °· You may need this if you have certain conditions. °Human papillomavirus (HPV) vaccine °· If recommended by your health care provider, you may need three doses over 6 months. °You may receive vaccines as individual doses or as more than one vaccine together in one shot (combination vaccines). Talk with your health care provider about the risks and benefits of combination vaccines. °What tests do I need? °Blood tests °· Lipid and cholesterol levels. These may be checked every 5 years, or more frequently if you are over 50 years old. °· Hepatitis C test. °· Hepatitis B test. °Screening °· Lung cancer screening. You may have this screening every year starting at age 55 if you have a 30-pack-year history of smoking and currently smoke or have quit within the past 15 years. °· Colorectal cancer screening. All adults should have this screening starting at age 50 and continuing until age 75. Your health care provider may recommend screening at age 45 if you are at increased risk. You will have tests every 1-10 years, depending on your results and the type of screening test. °· Diabetes screening. This is done by checking your blood sugar (glucose) after you have not eaten for a while (fasting). You may have this   done every 1-3 years.  Mammogram. This may be done every 1-2 years. Talk with your health care provider about when you should start having regular mammograms. This may depend on whether you have a family history of breast cancer.  BRCA-related cancer screening. This may be done if you have a family history of breast, ovarian, tubal, or peritoneal  cancers.  Pelvic exam and Pap test. This may be done every 3 years starting at age 76. Starting at age 89, this may be done every 5 years if you have a Pap test in combination with an HPV test. Other tests  Sexually transmitted disease (STD) testing.  Bone density scan. This is done to screen for osteoporosis. You may have this scan if you are at high risk for osteoporosis. Follow these instructions at home: Eating and drinking  Eat a diet that includes fresh fruits and vegetables, whole grains, lean protein, and low-fat dairy.  Take vitamin and mineral supplements as recommended by your health care provider.  Do not drink alcohol if: ? Your health care provider tells you not to drink. ? You are pregnant, may be pregnant, or are planning to become pregnant.  If you drink alcohol: ? Limit how much you have to 0-1 drink a day. ? Be aware of how much alcohol is in your drink. In the [place] one drink equals one 12 oz bottle of beer (355 mL), one 5 oz glass of wine (148 mL), or one 1 oz glass of hard liquor (44 mL). Lifestyle  Take daily care of your teeth and gums.  Stay active. Exercise for at least 30 minutes on 5 or more days each week.  Do not use any products that contain nicotine or tobacco, such as cigarettes, e-cigarettes, and chewing tobacco. If you need help quitting, ask your health care provider.  If you are sexually active, practice safe sex. Use a condom or other form of birth control (contraception) in order to prevent pregnancy and STIs (sexually transmitted infections).  If told by your health care provider, take low-dose aspirin daily starting at age 37. What's next?  Visit your health care provider once a year for a well check visit.  Ask your health care provider how often you should have your eyes and teeth checked.  Stay up to date on all vaccines. This information is not intended to replace advice given to you by your health care provider. Make sure you  discuss any questions you have with your health care provider. Document Revised: 02/18/2018 Document Reviewed: 02/18/2018 Elsevier Patient Education  2020 [employer].

## 2020-04-20 NOTE — Progress Notes (Signed)
HPI: Joanna Willis is a 64 y.o. female who  has a past medical history of Allergy, Arthritis, Colon polyps, Depression, GERD (gastroesophageal reflux disease), Hypercholesteremia, PONV (postoperative nausea and vomiting), Postmenopausal, and Wears glasses.  she presents to [facility] -  today, 04/20/20,  for chief complaint of: Annual physical exam  Dentist: twice a year, no concerns Eye exam: this year, wears glasses, no concerns Exercise: walking regularly, recent right knee replacement, left knee replacement but some complications Diet: eats all food groups, cut back on carbs Pap smear: 2019, still has cervix but had history Mammogram: UTD Colon cancer screening: UTD COVID vaccine: UTD  Concerns: Hot flashes  Past medical, surgical, social and family history reviewed:  Patient Active Problem List   Diagnosis Date Noted  . Status post right knee replacement 01/16/2020  . Osteoarthritis of right knee 01/13/2020  . Hormone replacement therapy (HRT) 03/03/2018  . S/P left unicompartmental knee replacement 01/13/2018  . Arthrofibrosis of total knee arthroplasty, subsequent encounter 01/21/2017  . Adjustment disorder with mixed anxiety and depressed mood 02/22/2016  . Mixed hyperlipidemia 08/22/2015  . Left knee pain 04/19/2014  . BRONCHITIS, ACUTE 07/21/2010    Past Surgical History:  Procedure Laterality Date  . ABDOMINAL HYSTERECTOMY  2005   did not remove ovaries  . CESAREAN SECTION  2000  . COLONOSCOPY    . CONVERSION TO TOTAL KNEE Left 01/13/2018   Procedure: Revision of Left knee unicompartmental arthroplasty to total knee arthroplasty;  Surgeon: [name], MD;  Location: [facility];  Service: Orthopedics;  Laterality: Left;  . FOOT ARTHRODESIS  1995   right  . KNEE ARTHROSCOPY WITH MEDIAL MENISECTOMY Left 05/17/2014   Procedure: LEFT ARTHROSCOPY KNEE WITH PARTIAL MEDIAL MENISECTOMY, CHONDROPLASTY OF LATERAL FEMORAL CONDYLE;   Surgeon: Alta Corning, MD;  Location: [place];  Service: Orthopedics;  Laterality: Left;  . KNEE CLOSED REDUCTION Left 07/21/2018   Procedure: CLOSED MANIPULATION KNEE;  Surgeon: [name], MD;  Location: [facility];  Service: Orthopedics;  Laterality: Left;  6mn  . PARTIAL KNEE ARTHROPLASTY Left 01/21/2017   Procedure: UNICOMPARTMENTAL LEFT KNEE;  Surgeon: AGaynelle Arabian MD;  Location: [facility];  Service: Orthopedics;  Laterality: Left;  . Revision left knee     Dr. [id number] [id number]  . TOTAL KNEE ARTHROPLASTY Right 01/16/2020   Procedure: RIGHT TOTAL KNEE ARTHROPLASTY;  Surgeon: RFrederik Pear MD;  Location: [facility];  Service: Orthopedics;  Laterality: Right;    Social History   Tobacco Use  . Smoking status: Never Smoker  . Smokeless tobacco: Never Used  Substance Use Topics  . Alcohol use: Yes    Alcohol/week: 7.0 standard drinks    Types: 7 Shots of liquor per week    Comment: 1 shot daily    Family History  Problem Relation Age of Onset  . Colon cancer Mother [id number]      lived 6 months after diagnosis  . Colon polyps Sister   . Diverticulosis Sister   . Diverticulitis Sister   . Coronary artery disease Father   . Pancreatic cancer Neg Hx   . Stomach cancer Neg Hx   . Esophageal cancer Neg Hx   . Rectal cancer Neg Hx      Current medication list and allergy/intolerance information reviewed:    Current Outpatient Medications  Medication Sig Dispense Refill  . atorvastatin (LIPITOR) 20 MG tablet Take 20 mg by mouth at bedtime.     . fluticasone (FLONASE) 50 MCG/ACT nasal  spray Place 1 spray into both nostrils daily as needed for allergies or rhinitis.    Marland Kitchen MELATONIN PO Take by mouth at bedtime.    . meloxicam (MOBIC) 15 MG tablet Take 15 mg by mouth daily.    Marland Kitchen omeprazole (PRILOSEC) 20 MG capsule Take 20 mg by mouth daily before breakfast.     . rOPINIRole (REQUIP) 0.25 MG tablet Take 0.25 mg by mouth at bedtime as needed (restless leg).     Marland Kitchen aspirin EC 81  MG tablet Take 1 tablet (81 mg total) by mouth 2 (two) times daily. (Patient not taking: Reported on 04/20/2020) 60 tablet 0  . estradiol (VIVELLE-DOT) 0.025 MG/24HR Place 1 patch onto the skin 2 (two) times a week. Mondays & Thursdays (Patient not taking: Reported on 04/20/2020)    . oxyCODONE-acetaminophen (PERCOCET/ROXICET) 5-325 MG tablet Take 1 tablet by mouth every 4 (four) hours as needed for severe pain. (Patient not taking: Reported on 04/20/2020) 30 tablet 0  . tiZANidine (ZANAFLEX) 2 MG tablet Take 1 tablet (2 mg total) by mouth every 6 (six) hours as needed. (Patient not taking: Reported on 04/20/2020) 60 tablet 0   No current facility-administered medications for this visit.    Allergies  Allergen Reactions  . Codeine Nausea Only    Severe constipation  . Dust Mite Mixed Allergen Ext  [Mite (D. Farinae)] Swelling  . Penicillins Rash      Review of Systems:  Constitutional:  No  fever, no chills, No recent illness, No unintentional weight changes. No significant fatigue.   HEENT: No  headache, no vision change, no hearing change, No sore throat, No  sinus pressure  Cardiac: No  chest pain, No  pressure, No palpitations, No  Orthopnea  Respiratory:  No  shortness of breath. No  Cough  Gastrointestinal: No  abdominal pain, No  nausea, No  vomiting,  No  blood in stool, No  diarrhea, No  constipation   Musculoskeletal: No new myalgia/arthralgia  Skin: No  Rash, No other wounds/concerning lesions  Genitourinary: No  incontinence, No  abnormal genital bleeding, No abnormal genital discharge  Hem/Onc: No  easy bruising/bleeding, No  abnormal lymph node  Endocrine: No cold intolerance,  No heat intolerance. No polyuria/polydipsia/polyphagia   Neurologic: No  weakness, No  dizziness, No  slurred speech/focal weakness/facial droop  Psychiatric: No  concerns with depression, No  concerns with anxiety, No sleep problems, No mood problems  Exam:  BP 134/86   Pulse 64    Temp 98 F (36.7 C) (Oral)   Ht 5' 5.25" (1.657 m)   Wt 150 lb 14.4 oz (68.4 kg)   SpO2 99%   BMI 24.92 kg/m   Constitutional: VS see above. General Appearance: alert, well-developed, well-nourished, NAD  Eyes: Normal lids and conjunctive, non-icteric sclera  Ears, Nose, Mouth, Throat: MMM, Normal external inspection ears/nares/mouth/lips/gums. TM normal bilaterally.    Neck: No masses, trachea midline. No thyroid enlargement. No tenderness/mass appreciated. No lymphadenopathy  Respiratory: Normal respiratory effort. no wheeze, no rhonchi, no rales  Cardiovascular: S1/S2 normal, no murmur, no rub/gallop auscultated. RRR. No lower extremity edema. Pedal pulse II/IV bilaterally DP and PT. No carotid bruit or JVD. No abdominal aortic bruit.  Gastrointestinal: Nontender, no masses. No hepatomegaly, no splenomegaly. No hernia appreciated. Bowel sounds normal. Rectal exam deferred.   Musculoskeletal: Gait normal. No clubbing/cyanosis of digits.   Neurological: Normal balance/coordination. No tremor. No cranial nerve deficit on limited exam. Motor and sensation intact and symmetric. Cerebellar reflexes  intact.   Skin: warm, dry, intact. No rash/ulcer. No concerning nevi or subq nodules on limited exam.    Psychiatric: Normal judgment/insight. Normal mood and affect. Oriented x3.    Results for orders placed or performed in visit on 04/20/20 (from the past 72 hour(s))  CBC     Status: None   Collection Time: 04/20/20 12:09 PM  Result Value Ref Range   WBC 4.4 3.8 - 10.8 Thousand/uL   RBC 4.52 3.80 - 5.10 Million/uL   Hemoglobin 13.8 11.7 - 15.5 g/dL   HCT 40.7 35 - 45 %   MCV 90.0 80.0 - 100.0 fL   MCH 30.5 27.0 - 33.0 pg   MCHC 33.9 32.0 - 36.0 g/dL   RDW 11.6 11.0 - 15.0 %   Platelets 259 140 - 400 Thousand/uL   MPV 10.1 7.5 - 12.5 fL    No results found.   ASSESSMENT/PLAN:   1. Encounter to establish care Reviewed available information discussed health care concerns  with patient.  Her biggest concern today was hot flashes.  She used to take hormone replacement therapy for this but has since stopped it.  Her hot flashes and night sweats have resumed.  She is interested in other options for her symptoms, especially Effexor, but would like to wait for now.  She will let me know if she decides this is something that she would like to do in the future.  2. Annual physical exam Checking CBC, CMP, and lipid panel today. - Lipid panel - COMPLETE METABOLIC PANEL WITH GFR - CBC  3. Screening for endocrine disorder Checking TSH today. - TSH  Orders Placed This Encounter  Procedures  . Lipid panel  . COMPLETE METABOLIC PANEL WITH GFR  . CBC  . TSH    No orders of the defined types were placed in this encounter.   Patient Instructions  Preventive Care 62-6 Years Old, Female Preventive care refers to visits with your health care provider and lifestyle choices that can promote health and wellness. This includes:  A yearly physical exam. This may also be called an annual well check.  Regular dental visits and eye exams.  Immunizations.  Screening for certain conditions.  Healthy lifestyle choices, such as eating a healthy diet, getting regular exercise, not using drugs or products that contain nicotine and tobacco, and limiting alcohol use. What can I expect for my preventive care visit? Physical exam Your health care provider will check your:  Height and weight. This may be used to calculate body mass index (BMI), which tells if you are at a healthy weight.  Heart rate and blood pressure.  Skin for abnormal spots. Counseling Your health care provider may ask you questions about your:  Alcohol, tobacco, and drug use.  Emotional well-being.  Home and relationship well-being.  Sexual activity.  Eating habits.  Work and work Statistician.  Method of birth control.  Menstrual cycle.  Pregnancy history. What immunizations do I  need?  Influenza (flu) vaccine  This is recommended every year. Tetanus, diphtheria, and pertussis (Tdap) vaccine  You may need a Td booster every 10 years. Varicella (chickenpox) vaccine  You may need this if you have not been vaccinated. Zoster (shingles) vaccine  You may need this after age 34. Measles, mumps, and rubella (MMR) vaccine  You may need at least one dose of MMR if you were born in 1957 or later. You may also need a second dose. Pneumococcal conjugate (PCV13) vaccine  You may need  this if you have certain conditions and were not previously vaccinated. Pneumococcal polysaccharide (PPSV23) vaccine  You may need one or two doses if you smoke cigarettes or if you have certain conditions. Meningococcal conjugate (MenACWY) vaccine  You may need this if you have certain conditions. Hepatitis A vaccine  You may need this if you have certain conditions or if you travel or work in places where you may be exposed to hepatitis A. Hepatitis B vaccine  You may need this if you have certain conditions or if you travel or work in places where you may be exposed to hepatitis B. Haemophilus influenzae type b (Hib) vaccine  You may need this if you have certain conditions. Human papillomavirus (HPV) vaccine  If recommended by your health care provider, you may need three doses over 6 months. You may receive vaccines as individual doses or as more than one vaccine together in one shot (combination vaccines). Talk with your health care provider about the risks and benefits of combination vaccines. What tests do I need? Blood tests  Lipid and cholesterol levels. These may be checked every 5 years, or more frequently if you are over 80 years old.  Hepatitis C test.  Hepatitis B test. Screening  Lung cancer screening. You may have this screening every year starting at age 40 if you have a 30-pack-year history of smoking and currently smoke or have quit within the past 15  years.  Colorectal cancer screening. All adults should have this screening starting at age 10 and continuing until age 58. Your health care provider may recommend screening at age 69 if you are at increased risk. You will have tests every 1-10 years, depending on your results and the type of screening test.  Diabetes screening. This is done by checking your blood sugar (glucose) after you have not eaten for a while (fasting). You may have this done every 1-3 years.  Mammogram. This may be done every 1-2 years. Talk with your health care provider about when you should start having regular mammograms. This may depend on whether you have a family history of breast cancer.  BRCA-related cancer screening. This may be done if you have a family history of breast, ovarian, tubal, or peritoneal cancers.  Pelvic exam and Pap test. This may be done every 3 years starting at age 23. Starting at age 22, this may be done every 5 years if you have a Pap test in combination with an HPV test. Other tests  Sexually transmitted disease (STD) testing.  Bone density scan. This is done to screen for osteoporosis. You may have this scan if you are at high risk for osteoporosis. Follow these instructions at home: Eating and drinking  Eat a diet that includes fresh fruits and vegetables, whole grains, lean protein, and low-fat dairy.  Take vitamin and mineral supplements as recommended by your health care provider.  Do not drink alcohol if: ? Your health care provider tells you not to drink. ? You are pregnant, may be pregnant, or are planning to become pregnant.  If you drink alcohol: ? Limit how much you have to 0-1 drink a day. ? Be aware of how much alcohol is in your drink. In the [place] one drink equals one 12 oz bottle of beer (355 mL), one 5 oz glass of wine (148 mL), or one 1 oz glass of hard liquor (44 mL). Lifestyle  Take daily care of your teeth and gums.  Stay active. Exercise for at least  30  minutes on 5 or more days each week.  Do not use any products that contain nicotine or tobacco, such as cigarettes, e-cigarettes, and chewing tobacco. If you need help quitting, ask your health care provider.  If you are sexually active, practice safe sex. Use a condom or other form of birth control (contraception) in order to prevent pregnancy and STIs (sexually transmitted infections).  If told by your health care provider, take low-dose aspirin daily starting at age 45. What's next?  Visit your health care provider once a year for a well check visit.  Ask your health care provider how often you should have your eyes and teeth checked.  Stay up to date on all vaccines. This information is not intended to replace advice given to you by your health care provider. Make sure you discuss any questions you have with your health care provider. Document Revised: 02/18/2018 Document Reviewed: 02/18/2018 Elsevier Patient Education  [place].  Follow-up plan: Return in about 1 year (around 04/20/2021) for annual physical exam or sooner if needed.  Clearnce Sorrel, DNP, APRN, FNP-BC [facility]

## 2020-04-21 LAB — COMPLETE METABOLIC PANEL WITH GFR
AG Ratio: 1.7 (calc) (ref 1.0–2.5)
ALT: 16 U/L (ref 6–29)
AST: 18 U/L (ref 10–35)
Albumin: 4.3 g/dL (ref 3.6–5.1)
Alkaline phosphatase (APISO): 62 U/L (ref 37–153)
BUN: 15 mg/dL (ref 7–25)
CO2: 29 mmol/L (ref 20–32)
Calcium: 9.7 mg/dL (ref 8.6–10.4)
Chloride: 101 mmol/L (ref 98–110)
Creat: 0.66 mg/dL (ref 0.50–0.99)
GFR, Est African American: 109 mL/min/{1.73_m2} (ref >=60)
GFR, Est Non African American: 94 mL/min/{1.73_m2} (ref >=60)
Globulin: 2.6 g/dL (calc) (ref 1.9–3.7)
Glucose, Bld: 94 mg/dL (ref 65–99)
Potassium: 4.1 mmol/L (ref 3.5–5.3)
Sodium: 140 mmol/L (ref 135–146)
Total Bilirubin: 0.8 mg/dL (ref 0.2–1.2)
Total Protein: 6.9 g/dL (ref 6.1–8.1)

## 2020-04-21 LAB — CBC
HCT: 40.7 % (ref 35.0–45.0)
Hemoglobin: 13.8 g/dL (ref 11.7–15.5)
MCH: 30.5 pg (ref 27.0–33.0)
MCHC: 33.9 g/dL (ref 32.0–36.0)
MCV: 90 fL (ref 80.0–100.0)
MPV: 10.1 fL (ref 7.5–12.5)
Platelets: 259 10*3/uL (ref 140–400)
RBC: 4.52 10*6/uL (ref 3.80–5.10)
RDW: 11.6 % (ref 11.0–15.0)
WBC: 4.4 10*3/uL (ref 3.8–10.8)

## 2020-04-21 LAB — LIPID PANEL
Cholesterol: 174 mg/dL (ref <200)
HDL: 79 mg/dL (ref >=50)
LDL Cholesterol (Calc): 79 mg/dL (calc)
Non-HDL Cholesterol (Calc): 95 mg/dL (calc) (ref <130)
Total CHOL/HDL Ratio: 2.2 (calc) (ref <5.0)
Triglycerides: 81 mg/dL (ref <150)

## 2020-04-21 LAB — TSH: TSH: 1.16 mIU/L (ref 0.40–4.50)

## 2020-05-18 ENCOUNTER — Other Ambulatory Visit: Payer: Self-pay | Admitting: Medical-Surgical

## 2020-05-22 ENCOUNTER — Other Ambulatory Visit: Payer: Self-pay

## 2020-05-22 ENCOUNTER — Other Ambulatory Visit: Payer: Self-pay | Admitting: Medical-Surgical

## 2020-05-22 DIAGNOSIS — E782 Mixed hyperlipidemia: Secondary | ICD-10-CM

## 2020-05-22 DIAGNOSIS — K219 Gastro-esophageal reflux disease without esophagitis: Secondary | ICD-10-CM

## 2020-05-22 MED ORDER — OMEPRAZOLE 20 MG PO CPDR: 20.0000 mg | DELAYED_RELEASE_CAPSULE | Freq: Every day | ORAL | 3 refills | Status: DC

## 2020-05-22 MED ORDER — ATORVASTATIN CALCIUM 20 MG PO TABS: 20.0000 mg | ORAL_TABLET | Freq: Every day | ORAL | 3 refills | Status: DC

## 2020-05-22 MED FILL — OMEPRAZOLE 20 MG CAP: 20 | 90 days supply | Qty: 90 | Fill #0

## 2020-05-22 MED FILL — ATORVASTATIN CALCIUM 20 MG: 20 | 90 days supply | Qty: 90 | Fill #0

## 2020-06-27 ENCOUNTER — Other Ambulatory Visit (HOSPITAL_COMMUNITY): Payer: Self-pay

## 2020-06-27 MED FILL — AZITHROMYCIN 500 MG TABS: 500 | 4 days supply | Qty: 4 | Fill #0

## 2020-07-05 DIAGNOSIS — Z96651 Presence of right artificial knee joint: Secondary | ICD-10-CM | POA: Diagnosis not present

## 2020-07-19 ENCOUNTER — Other Ambulatory Visit: Payer: Self-pay

## 2020-07-19 MED ORDER — FLUTICASONE PROPIONATE 50 MCG/ACT NA SUSP: 1.0000 | Freq: Every day | NASAL | 2 refills | Status: DC | PRN

## 2020-07-20 MED FILL — FLUTICASONE PROP 50 MCG SPR: 50 | 60 days supply | Qty: 16 | Fill #0

## 2020-08-08 ENCOUNTER — Other Ambulatory Visit: Payer: Self-pay | Admitting: Medical-Surgical

## 2020-08-08 MED FILL — MELOXICAM 15 MG TABLET: 15 | 90 days supply | Qty: 90 | Fill #0

## 2020-08-17 MED FILL — OMEPRAZOLE 20 MG CAP: 20 | 90 days supply | Qty: 90 | Fill #1

## 2020-08-17 MED FILL — ATORVASTATIN CALCIUM 20 MG: 20 | 90 days supply | Qty: 90 | Fill #1

## 2020-08-24 DIAGNOSIS — H5712 Ocular pain, left eye: Secondary | ICD-10-CM | POA: Diagnosis not present

## 2020-08-24 DIAGNOSIS — H10022 Other mucopurulent conjunctivitis, left eye: Secondary | ICD-10-CM | POA: Diagnosis not present

## 2020-09-05 ENCOUNTER — Other Ambulatory Visit: Payer: Self-pay | Admitting: Sports Medicine

## 2020-09-05 ENCOUNTER — Other Ambulatory Visit: Payer: Self-pay

## 2020-09-05 ENCOUNTER — Encounter: Payer: Self-pay | Admitting: Sports Medicine

## 2020-09-05 ENCOUNTER — Ambulatory Visit: Payer: 59 | Admitting: Sports Medicine

## 2020-09-05 DIAGNOSIS — S90415A Abrasion, left lesser toe(s), initial encounter: Secondary | ICD-10-CM

## 2020-09-05 DIAGNOSIS — M79675 Pain in left toe(s): Secondary | ICD-10-CM | POA: Diagnosis not present

## 2020-09-05 DIAGNOSIS — M2042 Other hammer toe(s) (acquired), left foot: Secondary | ICD-10-CM | POA: Diagnosis not present

## 2020-09-05 DIAGNOSIS — M79671 Pain in right foot: Secondary | ICD-10-CM

## 2020-09-05 NOTE — Progress Notes (Signed)
Subjective: Joanna Willis is a 65 y.o. female patient who presents to office for evaluation of Left foot pain. 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  Patient denies any other pedal complaints.   Review of system noncontributory.  Patient Active Problem List   Diagnosis Date Noted  . Status post right knee replacement 01/16/2020  . Osteoarthritis of right knee 01/13/2020  . Aftercare 12/29/2018  . Hormone replacement therapy (HRT) 03/03/2018  . S/P left unicompartmental knee replacement 01/13/2018  . Arthrofibrosis of total knee arthroplasty, subsequent encounter 01/21/2017  . Adjustment disorder with mixed anxiety and depressed mood 02/22/2016  . Mixed hyperlipidemia 08/22/2015  . GERD (gastroesophageal reflux disease) 08/22/2015  . Left knee pain 04/19/2014  . BRONCHITIS, ACUTE 07/21/2010    Current Outpatient Medications on File Prior to Visit  Medication Sig Dispense Refill  . citalopram (CELEXA) 20 MG tablet Take 1 tablet by mouth daily.    Marland Kitchen atorvastatin (LIPITOR) 20 MG tablet Take 1 tablet (20 mg total) by mouth at bedtime. 90 tablet 3  . fluticasone (FLONASE) 50 MCG/ACT nasal spray Place 1 spray into both nostrils daily as needed for allergies or rhinitis. 16 g 2  . meloxicam (MOBIC) 15 MG tablet TAKE 1 TABLET BY MOUTH DAILY AS NEEDED FOR PAIN 90 tablet 1  . omeprazole (PRILOSEC) 20 MG capsule Take 1 capsule (20 mg total) by mouth daily before breakfast. 90 capsule 3    No current facility-administered medications on file prior to visit.    Allergies  Allergen Reactions  . Codeine Nausea Only    Severe constipation  . Dust Mite Mixed Allergen Ext  [Mite (D. Farinae)] Swelling  . Penicillins Rash    Objective:  General: Alert and oriented x3 in no acute distress  Dermatology: There is a small abrasion noted to the left fifth toe dorsal aspect over the interphalangeal joint self-inflicted from patient excessively using corn pads that have caused a abrasion/wound to the toe. No webspace macerations, no ecchymosis bilateral, all nails are well manicured.  Vascular: Dorsalis Pedis and Posterior Tibial pedal pulses 2/4, Capillary Fill Time 3 seconds,(+) pedal hair growth bilateral, no edema bilateral lower extremities, Temperature gradient within normal limits.  Neurology: Gross sensation intact via light touch.  Musculoskeletal: Left fifth hammertoe with mild tenderness with palpation at PIPJ and elevation of toe. No pain with calf compression bilateral.  Strength within normal limits in all groups bilateral.        Assessment and Plan: Problem List Items Addressed This Visit   None   Visit Diagnoses    Hammertoe of left foot    -  Primary   Abrasion of fifth toe of left foot, initial encounter       Toe pain, left           -Complete examination performed -Patient declines x-rays at this time -Discussed treatment options -Advised patient to refrain  from using corn remover or corn pad to the left fifth toe since now it is raw with an abrasion to the area advised patient to use Betadine at bedtime until the skin has healed over -Advised patient during the day to use a Band-Aid to splint the toe to prevent rubbing once the toe is healed may use a toe cap -Advised patient to wear shoes that are good and supportive that do not rub or irritate the toe -Advised patient of toe worsens to return to office and to consider surgery in the future if  problem continues  Landis Martins, DPM

## 2020-09-05 NOTE — Patient Instructions (Signed)
https://orthoinfo.aaos.org/en/diseases--conditions/hammer-toe">  Hammer Toe Hammer toe is a change in the shape, or a deformity, of the toe. The deformity causes the middle joint of the toe to stay bent. Hammer toe starts gradually. At first, the toe can be straightened. Then over time, the toe deformity becomes stiff, inflexible, and permanently bent. Hammer toe usually affects the second, third, or fourth toe. A hammer toe causes pain, especially when wearing shoes. Corns and calluses can result from the toe rubbing against the inside of the shoe. Early treatments to keep the toe straight may relieve pain. As the deformity of the toe becomes stiff and permanent, surgery may be needed to straighten the toe. What are the causes? This condition is caused by abnormal bending of the toe joint that is closest to your foot. Over time, the toe bending downward pulls on the muscles and connections (tendons) of the toe joint, making them weak and stiff. Wearing shoes that are too narrow in the toe box and do not allow toes to fully straighten can cause this condition. What increases the risk? You are more likely to develop this condition if you:  Are an older female.  Wear shoes that are too small, or wear high-heeled shoes that pinch your toes.  Have a second toe that is longer than your big toe (first toe).  Injure your foot or toe.  Have arthritis, or have a nerve or muscle disorder.  Have diabetes or a condition known as Charcot joint, which may cause you to walk abnormally.  Have a family history of hammer toe.  Are a Engineer, mining. What are the signs or symptoms? Pain and deformity of the toe are the main symptoms of this condition. The pain is worse when wearing shoes, walking, or running. Other symptoms may include:  A thickened patch of skin, called a corn or callus, that forms over the top of the bent part of the toe or between the toes.  Redness and a burning feeling on the bent  toe.  An open sore that forms on the top of the bent toe.  Not being able to straighten the affected toe.   How is this diagnosed? This condition is diagnosed based on your symptoms and a physical exam. During the exam, your health care provider will try to straighten your toe to see how stiff the deformity is. You may also have tests, such as:  A blood test to check for rheumatoid arthritis or diabetes.  An X-ray to show how severe the toe deformity is. How is this treated? Treatment for this condition depends on whether the toe is flexible or deformed and no longer moveable. In less severe cases, a hammer toe can be straightened without surgery. These treatments include:  Taping the toe into a straightened position.  Using pads and cushions to protect the bent toe.  Wearing shoes that provide enough room for the toes.  Doing toe-stretching exercises at home.  Taking an NSAID, such as ibuprofen, to reduce pain and swelling.  Using special orthotics or insoles for pain relief and to improve walking. If these treatments do not help or the toe has a severe deformity and cannot be straightened, surgery is the next option. The most common surgeries used to straighten a hammer toe include:  Arthroplasty or osteotomy. Part of the toe joint is reconstructed or removed, which allows the toe to straighten.  Fusion. Cartilage between the two bones of the joint is taken out, and the bones are fused together  into one longer bone.  Implantation. Part of the bone is removed and replaced with an implant to allow the toe to move again.  Flexor tendon transfer. The tendons that curl the toes down (flexor tendons) are repositioned. Follow these instructions at home:  Take over-the-counter and prescription medicines only as told by your health care provider.  Do toe-straightening and stretching exercises as told by your health care provider.  Keep all follow-up visits. This is important. How is  this prevented?  Wear shoes that fit properly and give your toes enough room. Shoes should not cause pain.  Buy shoes at the end of the day to make sure they fit well, since your foot may swell during the day. Make sure they are comfortable before you buy them.  As you age, your shoe size might change, including the width. Measure both feet and buy shoes for the larger foot.  A shoe repair store might be able to stretch shoes that feel tight in spots.  Do not wear high-heeled shoes or shoes with pointed toes. Contact a health care provider if:  Your pain gets worse.  Your toe becomes red or swollen.  You develop an open sore on your toe. Summary  Hammer toe is a condition that gradually causes your toe to become bent and stiff.  Hammer toe can be treated by taping the toe into a straightened position and doing toe-stretching exercises. If these treatments do not help, surgery may be needed.  To prevent this condition, wear shoes that fit properly, give your toes enough room, and do not cause pain. This information is not intended to replace advice given to you by your health care provider. Make sure you discuss any questions you have with your health care provider. Document Revised: 09/15/2019 Document Reviewed: 09/15/2019 Elsevier Patient Education  2021 [employer].

## 2020-09-10 ENCOUNTER — Other Ambulatory Visit (HOSPITAL_BASED_OUTPATIENT_CLINIC_OR_DEPARTMENT_OTHER): Payer: Self-pay

## 2020-11-07 DIAGNOSIS — Z1231 Encounter for screening mammogram for malignant neoplasm of breast: Secondary | ICD-10-CM | POA: Diagnosis not present

## 2020-11-07 LAB — HM MAMMOGRAPHY: HM Mammogram: NORMAL (ref 0–4)

## 2020-11-20 ENCOUNTER — Other Ambulatory Visit (HOSPITAL_COMMUNITY): Payer: Self-pay

## 2020-11-20 MED FILL — Omeprazole Cap Delayed Release 20 MG: ORAL | 90 days supply | Qty: 90 | Fill #0 | Status: AC

## 2020-11-20 MED FILL — Atorvastatin Calcium Tab 20 MG (Base Equivalent): ORAL | 90 days supply | Qty: 90 | Fill #0 | Status: AC

## 2020-12-19 ENCOUNTER — Other Ambulatory Visit (HOSPITAL_COMMUNITY): Payer: Self-pay

## 2020-12-19 ENCOUNTER — Encounter: Payer: Self-pay | Admitting: Medical-Surgical

## 2020-12-19 MED FILL — Fluticasone Propionate Nasal Susp 50 MCG/ACT: NASAL | 30 days supply | Qty: 16 | Fill #0 | Status: AC

## 2020-12-19 MED FILL — Meloxicam Tab 15 MG: ORAL | 90 days supply | Qty: 90 | Fill #0 | Status: AC

## 2020-12-19 NOTE — Telephone Encounter (Signed)
Virtual visit has been scheduled with PCP on 12/21/20. AM

## 2020-12-21 ENCOUNTER — Telehealth (INDEPENDENT_AMBULATORY_CARE_PROVIDER_SITE_OTHER): Payer: 59 | Admitting: Medical-Surgical

## 2020-12-21 ENCOUNTER — Other Ambulatory Visit (HOSPITAL_COMMUNITY): Payer: Self-pay

## 2020-12-21 ENCOUNTER — Encounter: Payer: Self-pay | Admitting: Medical-Surgical

## 2020-12-21 DIAGNOSIS — F4323 Adjustment disorder with mixed anxiety and depressed mood: Secondary | ICD-10-CM | POA: Diagnosis not present

## 2020-12-21 MED ORDER — CITALOPRAM HYDROBROMIDE 20 MG PO TABS
20.0000 mg | ORAL_TABLET | Freq: Every day | ORAL | 3 refills | Status: DC
  Filled 2020-12-21: qty 90, 90d supply, fill #0

## 2020-12-21 NOTE — Progress Notes (Signed)
Virtual Visit via Video Note  I connected with Joanna Willis on 12/21/20 at  9:30 AM EDT by a video enabled telemedicine application and verified that I am speaking with the correct person using two identifiers.   I discussed the limitations of evaluation and management by telemedicine and the availability of in person appointments. The patient expressed understanding and agreed to proceed.  Patient location: home Provider locations: office  Subjective:    CC: depression  HPI: Pleasant 65 year old female presenting via [facility] video visit for follow up on depression. Has a long history of recurrent situational depression and has been treated off and on for nearly 10 years. Over the years, she has taken Celexa 20mg  daily. She tolerates the medication well and has no side effects. Knows the process for tapering on and off the medication to prevent withdrawal and resurgence of symptoms from abrupt cessation. With her previous provider, she was able to determine when and if she needed to return to treatment and when it was time to discontinue. Never had any issues coming off the medication. She would like to get restarted on the Celexa at this point since she has had quite a bit of family stress over the past few months and knows that she feels better on the medication. At times, she will take 1/2 dose rather than the whole dose, especially if she feels she is staying to sleepy or if her symptoms aren't bad. Denies SI/HI.   Past medical history, Surgical history, Family history not pertinant except as noted below, Social history, Allergies, and medications have been entered into the medical record, reviewed, and corrections made.   Review of Systems: See HPI for pertinent positives and negatives.   Objective:    General: Speaking clearly in complete sentences without any shortness of breath.  Alert and oriented x3.  Normal judgment. No apparent acute distress.  Impression and Recommendations:     1. Adjustment disorder with mixed anxiety and depressed mood Restarting Celexa 20mg  daily. Discussed starting and stopping medication and she is well aware of appropriate taper measures. As she is quite knowledgeable and knows her response, okay to titrate dose depending on her need and symptom severity. Refills sent to pharmacy.   I discussed the assessment and treatment plan with the patient. The patient was provided an opportunity to ask questions and all were answered. The patient agreed with the plan and demonstrated an understanding of the instructions.   The patient was advised to call back or seek an in-person evaluation if the symptoms worsen or if the condition fails to improve as anticipated.  20 minutes of non-face-to-face time was provided during this encounter.  Return for annual wellness appointment as scheduled.  Clearnce Sorrel, DNP, APRN, FNP-BC [facility]

## 2020-12-25 ENCOUNTER — Other Ambulatory Visit (HOSPITAL_COMMUNITY): Payer: Self-pay

## 2021-01-08 DIAGNOSIS — Z96651 Presence of right artificial knee joint: Secondary | ICD-10-CM | POA: Diagnosis not present

## 2021-02-18 ENCOUNTER — Other Ambulatory Visit (HOSPITAL_COMMUNITY): Payer: Self-pay

## 2021-02-18 MED FILL — Omeprazole Cap Delayed Release 20 MG: ORAL | 90 days supply | Qty: 90 | Fill #1 | Status: AC

## 2021-02-18 MED FILL — Atorvastatin Calcium Tab 20 MG (Base Equivalent): ORAL | 90 days supply | Qty: 90 | Fill #1 | Status: AC

## 2021-03-06 DIAGNOSIS — H52223 Regular astigmatism, bilateral: Secondary | ICD-10-CM | POA: Diagnosis not present

## 2021-03-06 DIAGNOSIS — H524 Presbyopia: Secondary | ICD-10-CM | POA: Diagnosis not present

## 2021-03-06 DIAGNOSIS — H25813 Combined forms of age-related cataract, bilateral: Secondary | ICD-10-CM | POA: Diagnosis not present

## 2021-03-06 DIAGNOSIS — H5203 Hypermetropia, bilateral: Secondary | ICD-10-CM | POA: Diagnosis not present

## 2021-04-24 ENCOUNTER — Ambulatory Visit (INDEPENDENT_AMBULATORY_CARE_PROVIDER_SITE_OTHER): Payer: 59 | Admitting: Medical-Surgical

## 2021-04-24 ENCOUNTER — Other Ambulatory Visit (HOSPITAL_COMMUNITY): Payer: Self-pay

## 2021-04-24 ENCOUNTER — Encounter: Payer: Self-pay | Admitting: Medical-Surgical

## 2021-04-24 ENCOUNTER — Other Ambulatory Visit: Payer: Self-pay

## 2021-04-24 VITALS — BP 109/74 | HR 66 | Resp 20 | Ht 65.25 in | Wt 147.4 lb

## 2021-04-24 DIAGNOSIS — E782 Mixed hyperlipidemia: Secondary | ICD-10-CM

## 2021-04-24 DIAGNOSIS — G2581 Restless legs syndrome: Secondary | ICD-10-CM | POA: Diagnosis not present

## 2021-04-24 DIAGNOSIS — Z0000 Encounter for general adult medical examination without abnormal findings: Secondary | ICD-10-CM | POA: Diagnosis not present

## 2021-04-24 DIAGNOSIS — K219 Gastro-esophageal reflux disease without esophagitis: Secondary | ICD-10-CM | POA: Diagnosis not present

## 2021-04-24 DIAGNOSIS — Z131 Encounter for screening for diabetes mellitus: Secondary | ICD-10-CM

## 2021-04-24 DIAGNOSIS — Z1329 Encounter for screening for other suspected endocrine disorder: Secondary | ICD-10-CM | POA: Diagnosis not present

## 2021-04-24 LAB — CBC WITH DIFFERENTIAL/PLATELET
Absolute Monocytes: 357 cells/uL (ref 200–950)
Basophils Absolute: 38 cells/uL (ref 0–200)
Basophils Relative: 1 %
Eosinophils Absolute: 179 cells/uL (ref 15–500)
Eosinophils Relative: 4.7 %
HCT: 43.4 % (ref 35.0–45.0)
Hemoglobin: 14.3 g/dL (ref 11.7–15.5)
Lymphs Abs: 1406 cells/uL (ref 850–3900)
MCH: 30.3 pg (ref 27.0–33.0)
MCHC: 32.9 g/dL (ref 32.0–36.0)
MCV: 91.9 fL (ref 80.0–100.0)
MPV: 10.1 fL (ref 7.5–12.5)
Monocytes Relative: 9.4 %
Neutro Abs: 1820 cells/uL (ref 1500–7800)
Neutrophils Relative %: 47.9 %
Platelets: 230 10*3/uL (ref 140–400)
RBC: 4.72 10*6/uL (ref 3.80–5.10)
RDW: 11.8 % (ref 11.0–15.0)
Total Lymphocyte: 37 %
WBC: 3.8 10*3/uL (ref 3.8–10.8)

## 2021-04-24 LAB — COMPLETE METABOLIC PANEL WITH GFR
AG Ratio: 1.8 (calc) (ref 1.0–2.5)
ALT: 17 U/L (ref 6–29)
AST: 19 U/L (ref 10–35)
Albumin: 4.6 g/dL (ref 3.6–5.1)
Alkaline phosphatase (APISO): 59 U/L (ref 37–153)
BUN: 14 mg/dL (ref 7–25)
CO2: 31 mmol/L (ref 20–32)
Calcium: 9.7 mg/dL (ref 8.6–10.4)
Chloride: 101 mmol/L (ref 98–110)
Creat: 0.71 mg/dL (ref 0.50–1.05)
Globulin: 2.5 g/dL (calc) (ref 1.9–3.7)
Glucose, Bld: 97 mg/dL (ref 65–99)
Potassium: 4.4 mmol/L (ref 3.5–5.3)
Sodium: 141 mmol/L (ref 135–146)
Total Bilirubin: 0.6 mg/dL (ref 0.2–1.2)
Total Protein: 7.1 g/dL (ref 6.1–8.1)
eGFR: 95 mL/min/{1.73_m2} (ref >=60)

## 2021-04-24 LAB — LIPID PANEL
Cholesterol: 203 mg/dL — ABNORMAL HIGH (ref <200)
HDL: 82 mg/dL (ref >=50)
LDL Cholesterol (Calc): 105 mg/dL (calc) — ABNORMAL HIGH
Non-HDL Cholesterol (Calc): 121 mg/dL (calc) (ref <130)
Total CHOL/HDL Ratio: 2.5 (calc) (ref <5.0)
Triglycerides: 73 mg/dL (ref <150)

## 2021-04-24 LAB — TSH: TSH: 1.33 mIU/L (ref 0.40–4.50)

## 2021-04-24 MED ORDER — ROPINIROLE HCL 0.5 MG PO TABS
0.5000 mg | ORAL_TABLET | Freq: Every evening | ORAL | 1 refills | Status: DC | PRN
  Filled 2021-04-24: qty 90, 90d supply, fill #0

## 2021-04-24 MED ORDER — MELOXICAM 15 MG PO TABS
ORAL_TABLET | Freq: Every day | ORAL | 1 refills | Status: DC | PRN
  Filled 2021-04-24: qty 90, 90d supply, fill #0

## 2021-04-24 MED ORDER — FLUTICASONE PROPIONATE 50 MCG/ACT NA SUSP
NASAL | 2 refills | Status: DC
  Filled 2021-04-24: qty 16, 60d supply, fill #0

## 2021-04-24 MED ORDER — ATORVASTATIN CALCIUM 20 MG PO TABS
ORAL_TABLET | Freq: Every day | ORAL | 3 refills | Status: DC
  Filled 2021-04-24: qty 90, fill #0
  Filled 2021-05-24: qty 90, 90d supply, fill #0

## 2021-04-24 MED ORDER — CITALOPRAM HYDROBROMIDE 20 MG PO TABS
20.0000 mg | ORAL_TABLET | Freq: Every day | ORAL | 3 refills | Status: DC
  Filled 2021-04-24: qty 90, 90d supply, fill #0

## 2021-04-24 MED ORDER — OMEPRAZOLE 20 MG PO CPDR
DELAYED_RELEASE_CAPSULE | ORAL | 3 refills | Status: DC
  Filled 2021-04-24: qty 90, fill #0
  Filled 2021-05-24: qty 90, 90d supply, fill #0

## 2021-04-24 NOTE — Progress Notes (Signed)
HPI: Joanna Willis is a 65 y.o. female who  has a past medical history of Allergy, Arthritis, Colon polyps, Depression, GERD (gastroesophageal reflux disease), Hypercholesteremia, PONV (postoperative nausea and vomiting), Postmenopausal, and Wears glasses.  she presents to [facility] today, 04/24/21,  for chief complaint of: Annual physical exam  Dentist: UTD Eye exam: UTD Exercise: staying active Diet: no restrictions Mammogram: 10/2020 Colon cancer screening: 10/2019 COVID vaccine: done, no booster  Concerns: Knot on forehead- been there for  years. Saw dermatology but they said it is a bony calcification and would not remove it. Interested in getting it taken off and wonders who she should see.   Restless legs- had trouble with this for years but got better. After her knee replacements, got worse. Has an old prescription for Requip that she has been using prn with good relief. Would like a new prescription today.   Past medical, surgical, social and family history reviewed:  Patient Active Problem List   Diagnosis Date Noted   Status post right knee replacement 01/16/2020   Osteoarthritis of right knee 01/13/2020   Aftercare 12/29/2018   Hormone replacement therapy (HRT) 03/03/2018   S/P left unicompartmental knee replacement 01/13/2018   Arthrofibrosis of total knee arthroplasty, subsequent encounter 01/21/2017   Adjustment disorder with mixed anxiety and depressed mood 02/22/2016   Mixed hyperlipidemia 08/22/2015   GERD (gastroesophageal reflux disease) 08/22/2015   Left knee pain 04/19/2014   BRONCHITIS, ACUTE 07/21/2010    Past Surgical History:  Procedure Laterality Date   ABDOMINAL HYSTERECTOMY  2005   did not remove ovaries   CESAREAN SECTION  2000   COLONOSCOPY     CONVERSION TO TOTAL KNEE Left 01/13/2018   Procedure: Revision of Left knee unicompartmental arthroplasty to total knee arthroplasty;  Surgeon: [name], MD;   Location: [facility];  Service: Orthopedics;  Laterality: Left;   FOOT ARTHRODESIS  1995   right   KNEE ARTHROSCOPY WITH MEDIAL MENISECTOMY Left 05/17/2014   Procedure: LEFT ARTHROSCOPY KNEE WITH PARTIAL MEDIAL MENISECTOMY, CHONDROPLASTY OF LATERAL FEMORAL CONDYLE;  Surgeon: Alta Corning, MD;  Location: [facility];  Service: Orthopedics;  Laterality: Left;   KNEE CLOSED REDUCTION Left 07/21/2018   Procedure: CLOSED MANIPULATION KNEE;  Surgeon: [name], MD;  Location: [facility];  Service: Orthopedics;  Laterality: Left;  76mn   PARTIAL KNEE ARTHROPLASTY Left 01/21/2017   Procedure: UNICOMPARTMENTAL LEFT KNEE;  Surgeon: AGaynelle Arabian MD;  Location: [facility];  Service: Orthopedics;  Laterality: Left;   Revision left knee     Dr. AWynelle [id number]   TOTAL KNEE ARTHROPLASTY Right 01/16/2020   Procedure: RIGHT TOTAL KNEE ARTHROPLASTY;  Surgeon: RFrederik Pear MD;  Location: [facility];  Service: Orthopedics;  Laterality: Right;    Social History   Tobacco Use   Smoking status: Never   Smokeless tobacco: Never  Substance Use Topics   Alcohol use: Yes    Alcohol/week: 7.0 standard drinks    Types: 7 Shots of liquor per week    Comment: 1 shot daily    Family History  Problem Relation Age of Onset   Colon cancer Mother 674      lived 6 months after diagnosis   Colon polyps Sister    Diverticulosis Sister    Diverticulitis Sister    Coronary artery disease Father    Pancreatic cancer Neg Hx    Stomach cancer Neg Hx    Esophageal cancer Neg Hx  Rectal cancer Neg Hx      Current medication list and allergy/intolerance information reviewed:    Current Outpatient Medications  Medication Sig Dispense Refill   azithromycin (ZITHROMAX) 500 MG tablet TAKE 1 TABLET 1 HOUR PRIOR TO PROCEDURE. 4 tablet 1   atorvastatin (LIPITOR) 20 MG tablet TAKE 1 TABLET BY MOUTH AT BEDTIME 90 tablet 3   citalopram (CELEXA) 20 MG tablet Take 1 tablet (20 mg total) by mouth daily. 90 tablet 3    fluticasone (FLONASE) 50 MCG/ACT nasal spray PLACE 1 SPRAY INTO BOTH NOSTRILS DAILY AS NEEDED FOR ALLERGIES OR RHINITIS. 16 g 2   meloxicam (MOBIC) 15 MG tablet TAKE 1 TABLET BY MOUTH DAILY AS NEEDED FOR PAIN 90 tablet 1   omeprazole (PRILOSEC) 20 MG capsule TAKE 1 CAPSULE BY MOUTH ONCE DAILY BEFORE BREAKFAST 90 capsule 3   rOPINIRole (REQUIP) 0.5 MG tablet Take 1 tablet (0.5 mg total) by mouth at bedtime as needed (restless leg). 90 tablet 1   No current facility-administered medications for this visit.    Allergies  Allergen Reactions   Codeine Nausea Only    Severe constipation   Dust Mite Mixed Allergen Ext  [Mite (D. Farinae)] Swelling   Penicillins Rash      Review of Systems: Constitutional:  No  fever, no chills, No recent illness, No unintentional weight changes. No significant fatigue.  HEENT: No  headache, no vision change, no hearing change, No sore throat, No  sinus pressure Cardiac: No  chest pain, No  pressure, No palpitations, No  Orthopnea Respiratory:  No  shortness of breath. No  Cough Gastrointestinal: No  abdominal pain, No  nausea, No  vomiting,  No  blood in stool, No  diarrhea, No  constipation  Musculoskeletal: + left thumb trigger finger Skin: No  Rash, + small hard lump to the center of her forehead Genitourinary: No  incontinence, No  abnormal genital bleeding, No abnormal genital discharge Hem/Onc: No  easy bruising/bleeding, No  abnormal lymph node Endocrine: No cold intolerance,  No heat intolerance. No polyuria/polydipsia/polyphagia  Neurologic: No  weakness, No  dizziness, No  slurred speech/focal weakness/facial droop Psychiatric: No  concerns with depression, No  concerns with anxiety, No sleep problems, No mood problems  Exam:  BP 109/74   Pulse 66   Resp 20   Ht 5' 5.25" (1.657 m)   Wt 147 lb 6.4 oz (66.9 kg)   SpO2 98%   BMI 24.34 kg/m  Constitutional: VS see above. General Appearance: alert, well-developed, well-nourished, NAD Eyes:  Normal lids and conjunctive, non-icteric sclera Ears, Nose, Mouth, Throat: MMM, Normal external inspection ears/nares/mouth/lips/gums. TM normal bilaterally.  Neck: No masses, trachea midline. No thyroid enlargement. No tenderness/mass appreciated. No lymphadenopathy Respiratory: Normal respiratory effort. no wheeze, no rhonchi, no rales Cardiovascular: S1/S2 normal, no murmur, no rub/gallop auscultated. RRR. No lower extremity edema. Pedal pulse II/IV bilaterally PT. No carotid bruit or JVD. No abdominal aortic bruit. Gastrointestinal: Nontender, no masses. No hepatomegaly, no splenomegaly. No hernia appreciated. Bowel sounds normal. Rectal exam deferred.  Musculoskeletal: Gait normal. No clubbing/cyanosis of digits.  Neurological: Normal balance/coordination. No tremor. No cranial nerve deficit on limited exam. Motor and sensation intact and symmetric. Cerebellar reflexes intact.  Skin: warm, dry, intact. No rash/ulcer. No concerning nevi or subq nodules on limited exam.   Psychiatric: Normal judgment/insight. Normal mood and affect. Oriented x3.    ASSESSMENT/PLAN:   1. Annual physical exam Checking labs. Wellness information provided with AVS.  - Lipid panel -  COMPLETE METABOLIC PANEL WITH GFR - CBC with Differential/Platelet  2. Thyroid disorder screen Checking TSH.  - TSH  3. Diabetes mellitus screening Checking A1c.  - Hemoglobin A1c  4. Mixed hyperlipidemia Checking lipid panel. Continue Atorvastatin.  - atorvastatin (LIPITOR) 20 MG tablet; TAKE 1 TABLET BY MOUTH AT BEDTIME  Dispense: 90 tablet; Refill: 3  5. Gastroesophageal reflux disease, unspecified whether esophagitis present Continue Omeprazole.  - omeprazole (PRILOSEC) 20 MG capsule; TAKE 1 CAPSULE BY MOUTH ONCE DAILY BEFORE BREAKFAST  Dispense: 90 capsule; Refill: 3  6. Restless leg syndrome Continue prn Requip.  - rOPINIRole (REQUIP) 0.5 MG tablet; Take 1 tablet (0.5 mg total) by mouth at bedtime as needed  (restless leg).  Dispense: 90 tablet; Refill: 1   Orders Placed This Encounter  Procedures   [facility] MAMMOGRAPHY   TSH   Lipid panel   COMPLETE METABOLIC PANEL WITH GFR   CBC with Differential/Platelet   Hemoglobin A1c    Meds ordered this encounter  Medications   atorvastatin (LIPITOR) 20 MG tablet    Sig: TAKE 1 TABLET BY MOUTH AT BEDTIME    Dispense:  90 tablet    Refill:  3    Order Specific Question:   Supervising Provider    Answer:   MATTHEWS, CODY [[id number]]   omeprazole (PRILOSEC) 20 MG capsule    Sig: TAKE 1 CAPSULE BY MOUTH ONCE DAILY BEFORE BREAKFAST    Dispense:  90 capsule    Refill:  3    Order Specific Question:   Supervising Provider    Answer:   MATTHEWS, CODY [[id number]]   rOPINIRole (REQUIP) 0.5 MG tablet    Sig: Take 1 tablet (0.5 mg total) by mouth at bedtime as needed (restless leg).    Dispense:  90 tablet    Refill:  1   meloxicam (MOBIC) 15 MG tablet    Sig: TAKE 1 TABLET BY MOUTH DAILY AS NEEDED FOR PAIN    Dispense:  90 tablet    Refill:  1    Order Specific Question:   Supervising Provider    Answer:   MATTHEWS, CODY [[id number]]   fluticasone (FLONASE) 50 MCG/ACT nasal spray    Sig: PLACE 1 SPRAY INTO BOTH NOSTRILS DAILY AS NEEDED FOR ALLERGIES OR RHINITIS.    Dispense:  16 g    Refill:  2    Order Specific Question:   Supervising Provider    Answer:   MATTHEWS, CODY [[id number]]   citalopram (CELEXA) 20 MG tablet    Sig: Take 1 tablet (20 mg total) by mouth daily.    Dispense:  90 tablet    Refill:  3    Order Specific Question:   Supervising Provider    Answer:   Luetta Nutting [[id number]]    Patient Instructions  Preventive Care 24-58 Years Old, Female Preventive care refers to lifestyle choices and visits with your health care provider that can promote health and wellness. This includes: A yearly physical exam. This is also called an annual wellness visit. Regular dental and eye exams. Immunizations. Screening for certain conditions. Healthy lifestyle  choices, such as: Eating a healthy diet. Getting regular exercise. Not using drugs or products that contain nicotine and tobacco. Limiting alcohol use. What can I expect for my preventive care visit? Physical exam Your health care provider will check your: Height and weight. These may be used to calculate your BMI (body mass index). BMI is a measurement that tells if you are at a healthy weight.  Heart rate and blood pressure. Body temperature. Skin for abnormal spots. Counseling Your health care provider may ask you questions about your: Past medical problems. Family's medical history. Alcohol, tobacco, and drug use. Emotional well-being. Home life and relationship well-being. Sexual activity. Diet, exercise, and sleep habits. Work and work Statistician. Access to firearms. Method of birth control. Menstrual cycle. Pregnancy history. What immunizations do I need? Vaccines are usually given at various ages, according to a schedule. Your health care provider will recommend vaccines for you based on your age, medical history, and lifestyle or other factors, such as travel or where you work. What tests do I need? Blood tests Lipid and cholesterol levels. These may be checked every 5 years, or more often if you are over 63 years old. Hepatitis C test. Hepatitis B test. Screening Lung cancer screening. You may have this screening every year starting at age 23 if you have a 30-pack-year history of smoking and currently smoke or have quit within the past 15 years. Colorectal cancer screening. All adults should have this screening starting at age 87 and continuing until age 43. Your health care provider may recommend screening at age 60 if you are at increased risk. You will have tests every 1-10 years, depending on your results and the type of screening test. Diabetes screening. This is done by checking your blood sugar (glucose) after you have not eaten for a while (fasting). You may  have this done every 1-3 years. Mammogram. This may be done every 1-2 years. Talk with your health care provider about when you should start having regular mammograms. This may depend on whether you have a family history of breast cancer. BRCA-related cancer screening. This may be done if you have a family history of breast, ovarian, tubal, or peritoneal cancers. Pelvic exam and Pap test. This may be done every 3 years starting at age 24. Starting at age 38, this may be done every 5 years if you have a Pap test in combination with an HPV test. Other tests STD (sexually transmitted disease) testing, if you are at risk. Bone density scan. This is done to screen for osteoporosis. You may have this scan if you are at high risk for osteoporosis. Talk with your health care provider about your test results, treatment options, and if necessary, the need for more tests. Follow these instructions at home: Eating and drinking  Eat a diet that includes fresh fruits and vegetables, whole grains, lean protein, and low-fat dairy products. Take vitamin and mineral supplements as recommended by your health care provider. Do not drink alcohol if: Your health care provider tells you not to drink. You are pregnant, may be pregnant, or are planning to become pregnant. If you drink alcohol: Limit how much you have to 0-1 drink a day. Be aware of how much alcohol is in your drink. In the [place] one drink equals one 12 oz bottle of beer (355 mL), one 5 oz glass of wine (148 mL), or one 1 oz glass of hard liquor (44 mL). Lifestyle Take daily care of your teeth and gums. Brush your teeth every morning and night with fluoride toothpaste. Floss one time each day. Stay active. Exercise for at least 30 minutes 5 or more days each week. Do not use any products that contain nicotine or tobacco, such as cigarettes, e-cigarettes, and chewing tobacco. If you need help quitting, ask your health care provider. Do not use  drugs. If you are sexually active, practice safe sex.  Use a condom or other form of protection to prevent STIs (sexually transmitted infections). If you do not wish to become pregnant, use a form of birth control. If you plan to become pregnant, see your health care provider for a prepregnancy visit. If told by your health care provider, take low-dose aspirin daily starting at age 103. Find healthy ways to cope with stress, such as: Meditation, yoga, or listening to music. Journaling. Talking to a trusted person. Spending time with friends and family. Safety Always wear your seat belt while driving or riding in a vehicle. Do not drive: If you have been drinking alcohol. Do not ride with someone who has been drinking. When you are tired or distracted. While texting. Wear a helmet and other protective equipment during sports activities. If you have firearms in your house, make sure you follow all gun safety procedures. What's next? Visit your health care provider once a year for an annual wellness visit. Ask your health care provider how often you should have your eyes and teeth checked. Stay up to date on all vaccines. This information is not intended to replace advice given to you by your health care provider. Make sure you discuss any questions you have with your health care provider. Document Revised: 08/17/2020 Document Reviewed: 02/18/2018 Elsevier Patient Education  2022 [employer].   Follow-up plan: Return in about 1 year (around 04/24/2022) for annual physical exam or sooner if needed.  Clearnce Sorrel, DNP, APRN, FNP-BC [facility]

## 2021-04-24 NOTE — Patient Instructions (Signed)
Preventive Care 40-64 Years Old, Female Preventive care refers to lifestyle choices and visits with your health care provider that can promote health and wellness. This includes: A yearly physical exam. This is also called an annual wellness visit. Regular dental and eye exams. Immunizations. Screening for certain conditions. Healthy lifestyle choices, such as: Eating a healthy diet. Getting regular exercise. Not using drugs or products that contain nicotine and tobacco. Limiting alcohol use. What can I expect for my preventive care visit? Physical exam Your health care provider will check your: Height and weight. These may be used to calculate your BMI (body mass index). BMI is a measurement that tells if you are at a healthy weight. Heart rate and blood pressure. Body temperature. Skin for abnormal spots. Counseling Your health care provider may ask you questions about your: Past medical problems. Family's medical history. Alcohol, tobacco, and drug use. Emotional well-being. Home life and relationship well-being. Sexual activity. Diet, exercise, and sleep habits. Work and work environment. Access to firearms. Method of birth control. Menstrual cycle. Pregnancy history. What immunizations do I need? Vaccines are usually given at various ages, according to a schedule. Your health care provider will recommend vaccines for you based on your age, medical history, and lifestyle or other factors, such as travel or where you work. What tests do I need? Blood tests Lipid and cholesterol levels. These may be checked every 5 years, or more often if you are over 64 years old. Hepatitis C test. Hepatitis B test. Screening Lung cancer screening. You may have this screening every year starting at age 64 if you have a 30-pack-year history of smoking and currently smoke or have quit within the past 15 years. Colorectal cancer screening. All adults should have this screening starting at  age 50 and continuing until age 75. Your health care provider may recommend screening at age 45 if you are at increased risk. You will have tests every 1-10 years, depending on your results and the type of screening test. Diabetes screening. This is done by checking your blood sugar (glucose) after you have not eaten for a while (fasting). You may have this done every 1-3 years. Mammogram. This may be done every 1-2 years. Talk with your health care provider about when you should start having regular mammograms. This may depend on whether you have a family history of breast cancer. BRCA-related cancer screening. This may be done if you have a family history of breast, ovarian, tubal, or peritoneal cancers. Pelvic exam and Pap test. This may be done every 3 years starting at age 21. Starting at age 30, this may be done every 5 years if you have a Pap test in combination with an HPV test. Other tests STD (sexually transmitted disease) testing, if you are at risk. Bone density scan. This is done to screen for osteoporosis. You may have this scan if you are at high risk for osteoporosis. Talk with your health care provider about your test results, treatment options, and if necessary, the need for more tests. Follow these instructions at home: Eating and drinking  Eat a diet that includes fresh fruits and vegetables, whole grains, lean protein, and low-fat dairy products. Take vitamin and mineral supplements as recommended by your health care provider. Do not drink alcohol if: Your health care provider tells you not to drink. You are pregnant, may be pregnant, or are planning to become pregnant. If you drink alcohol: Limit how much you have to 0-1 drink a day. Be   aware of how much alcohol is in your drink. In the [place] one drink equals one 12 oz bottle of beer (355 mL), one 5 oz glass of wine (148 mL), or one 1 oz glass of hard liquor (44 mL). Lifestyle Take daily care of your teeth and  gums. Brush your teeth every morning and night with fluoride toothpaste. Floss one time each day. Stay active. Exercise for at least 30 minutes 5 or more days each week. Do not use any products that contain nicotine or tobacco, such as cigarettes, e-cigarettes, and chewing tobacco. If you need help quitting, ask your health care provider. Do not use drugs. If you are sexually active, practice safe sex. Use a condom or other form of protection to prevent STIs (sexually transmitted infections). If you do not wish to become pregnant, use a form of birth control. If you plan to become pregnant, see your health care provider for a prepregnancy visit. If told by your health care provider, take low-dose aspirin daily starting at age 64. Find healthy ways to cope with stress, such as: Meditation, yoga, or listening to music. Journaling. Talking to a trusted person. Spending time with friends and family. Safety Always wear your seat belt while driving or riding in a vehicle. Do not drive: If you have been drinking alcohol. Do not ride with someone who has been drinking. When you are tired or distracted. While texting. Wear a helmet and other protective equipment during sports activities. If you have firearms in your house, make sure you follow all gun safety procedures. What's next? Visit your health care provider once a year for an annual wellness visit. Ask your health care provider how often you should have your eyes and teeth checked. Stay up to date on all vaccines. This information is not intended to replace advice given to you by your health care provider. Make sure you discuss any questions you have with your health care provider. Document Revised: 08/17/2020 Document Reviewed: 02/18/2018 Elsevier Patient Education  2022 [employer]  

## 2021-04-25 ENCOUNTER — Encounter: Payer: Self-pay | Admitting: Medical-Surgical

## 2021-05-13 ENCOUNTER — Encounter: Payer: Self-pay | Admitting: Plastic Surgery

## 2021-05-13 ENCOUNTER — Other Ambulatory Visit: Payer: Self-pay

## 2021-05-13 ENCOUNTER — Ambulatory Visit: Payer: 59 | Admitting: Plastic Surgery

## 2021-05-13 VITALS — BP 143/86 | HR 85 | Ht 66.0 in | Wt 145.0 lb

## 2021-05-13 DIAGNOSIS — D489 Neoplasm of uncertain behavior, unspecified: Secondary | ICD-10-CM

## 2021-05-13 NOTE — Progress Notes (Signed)
Referring Provider Samuel Bouche, NP [address] [place],  [place] [address]   CC:  Frontal mass bony   Joanna Willis is an 65 y.o. female.  HPI: The patient is a 65 year old with a bony mass in the left frontal region.  The lesion is to the left of midline.  No headaches or associated symptoms.  She is possibly interested in having this removed.  She has not had any imaging on this mass.  She is a non-smoker.  No diabetes.  Allergies  Allergen Reactions   Codeine Nausea Only    Severe constipation   Dust Mite Mixed Allergen Ext  [Mite (D. Farinae)] Swelling   Penicillins Rash    Outpatient Encounter Medications as of 05/13/2021  Medication Sig   atorvastatin (LIPITOR) 20 MG tablet TAKE 1 TABLET BY MOUTH AT BEDTIME   azithromycin (ZITHROMAX) 500 MG tablet TAKE 1 TABLET 1 HOUR PRIOR TO PROCEDURE.   citalopram (CELEXA) 20 MG tablet Take 1 tablet (20 mg total) by mouth daily.   fluticasone (FLONASE) 50 MCG/ACT nasal spray PLACE 1 SPRAY INTO BOTH NOSTRILS DAILY AS NEEDED FOR ALLERGIES OR RHINITIS.   meloxicam (MOBIC) 15 MG tablet TAKE 1 TABLET BY MOUTH DAILY AS NEEDED FOR PAIN   omeprazole (PRILOSEC) 20 MG capsule TAKE 1 CAPSULE BY MOUTH ONCE DAILY BEFORE BREAKFAST   rOPINIRole (REQUIP) 0.5 MG tablet Take 1 tablet (0.5 mg total) by mouth at bedtime as needed (restless leg).   No facility-administered encounter medications on file as of 05/13/2021.     Past Medical History:  Diagnosis Date   Allergy    Arthritis    Colon polyps    Depression    GERD (gastroesophageal reflux disease)    Hypercholesteremia    PONV (postoperative nausea and vomiting)    Postmenopausal    Wears glasses     Past Surgical History:  Procedure Laterality Date   CESAREAN SECTION  2000   COLONOSCOPY     CONVERSION TO TOTAL KNEE Left 01/13/2018   Procedure: Revision of Left knee unicompartmental arthroplasty to total knee arthroplasty;  Surgeon: [name], MD;  Location: [facility];  Service: Orthopedics;  Laterality: Left;   FOOT ARTHRODESIS  1995   right   HYSTERECTOMY ABDOMINAL WITH SALPINGECTOMY  2005   did not remove ovaries   KNEE ARTHROSCOPY WITH MEDIAL MENISECTOMY Left 05/17/2014   Procedure: LEFT ARTHROSCOPY KNEE WITH PARTIAL MEDIAL MENISECTOMY, CHONDROPLASTY OF LATERAL FEMORAL CONDYLE;  Surgeon: Alta Corning, MD;  Location: [facility];  Service: Orthopedics;  Laterality: Left;   KNEE CLOSED REDUCTION Left 07/21/2018   Procedure: CLOSED MANIPULATION KNEE;  Surgeon: [name], MD;  [facility];  Service: Orthopedics;  Laterality: Left;  50min   PARTIAL KNEE ARTHROPLASTY Left 01/21/2017   Procedure: UNICOMPARTMENTAL LEFT KNEE;  Surgeon: [name], MD;  [facility];  Service: Orthopedics;  Laterality: Left;   Revision left knee     Dr. Wynelle Link 01-13-18   TOTAL KNEE ARTHROPLASTY Right 01/16/2020   Procedure: RIGHT TOTAL KNEE ARTHROPLASTY;  Surgeon: Frederik Pear, MD;  [facility];  Service: Orthopedics;  Laterality: Right;    Family History  Problem Relation Age of Onset   Colon cancer Mother 42       lived 2 months after diagnosis   Colon polyps Sister    Diverticulosis Sister    Diverticulitis Sister    Coronary artery disease Father    Pancreatic cancer Neg Hx  Stomach cancer Neg Hx    Esophageal cancer Neg Hx    Rectal cancer Neg Hx     Social History   Social History Narrative   Not on file     Review of Systems General: Denies fevers, chills, weight loss CV: Denies chest pain, shortness of breath, palpitations   Physical Exam Vitals with BMI 05/13/2021 04/24/2021 04/24/2021  Height 5\' 6"  - 5' 5.25"  Weight 145 lbs - 147 lbs 6 oz  BMI 85.63 - 14.97  Systolic [phone]  Diastolic 86 74 80  Pulse 85 - 66    General:  No acute distress,  Alert and oriented, Non-Toxic, Normal speech and affect HEENT: Patient has a left frontal mass 1.3 x 1 cm.  It is just to the left of center.  Nonmobile and  attached to bone.  Assessment/Plan Patient with a bony mass of the left frontal region.  I think the neck step is imaging with a noncontrast CT.  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  We will see her back following imaging.  Pictures were obtained of the patient and placed in the chart with the patient's or guardian's permission.   Time based coding: 15 minutes were spent with the patient.  Greater than 50% was spent on counseling cordination of care.  We discussed the nature of possible surgery to remove this and the reasons for imagine.   [name] 05/13/2021, 1:24 PM

## 2021-05-22 ENCOUNTER — Other Ambulatory Visit: Payer: Self-pay

## 2021-05-22 ENCOUNTER — Ambulatory Visit (HOSPITAL_BASED_OUTPATIENT_CLINIC_OR_DEPARTMENT_OTHER)
Admission: RE | Admit: 2021-05-22 | Discharge: 2021-05-22 | Disposition: A | Discharge status: Home / Self Care | Payer: 59 | Source: Ambulatory Visit | Attending: Plastic Surgery | Admitting: Plastic Surgery

## 2021-05-22 DIAGNOSIS — R220 Localized swelling, mass and lump, head: Secondary | ICD-10-CM | POA: Diagnosis not present

## 2021-05-22 DIAGNOSIS — D489 Neoplasm of uncertain behavior, unspecified: Secondary | ICD-10-CM | POA: Diagnosis not present

## 2021-05-24 ENCOUNTER — Other Ambulatory Visit (HOSPITAL_COMMUNITY): Payer: Self-pay

## 2021-06-18 ENCOUNTER — Encounter: Payer: 59 | Admitting: Physician Assistant

## 2021-07-10 ENCOUNTER — Other Ambulatory Visit (HOSPITAL_COMMUNITY): Payer: Self-pay

## 2021-07-22 ENCOUNTER — Encounter (HOSPITAL_BASED_OUTPATIENT_CLINIC_OR_DEPARTMENT_OTHER): Payer: Self-pay | Admitting: Plastic Surgery

## 2021-07-22 ENCOUNTER — Other Ambulatory Visit: Payer: Self-pay

## 2021-07-22 NOTE — Progress Notes (Deleted)
Patient ID: Joanna Willis, female    DOB: September 14, 1955, 66 y.o.   MRN: [id number]  No chief complaint on file.   No diagnosis found.   History of Present Illness: Joanna Willis is a 66 y.o.  female  with a history of left frontal region mass.  She presents for preoperative evaluation for upcoming procedure, excision left forehead bony mass with intermediate or complex closure, scheduled for 07/31/2021 with Dr.  Erin Hearing .  The patient {HAS HAS ZYS:[id number]} had problems with anesthesia. ***  Summary of Previous Visit: Patient was seen for initial consult 05/13/2021.  She expressed interest in surgical excision of her bony mass.  He was noted to be approximately 1.3 x 1 cm on exam.  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  Discussed possible surgical approach.  CT maxillofacial obtained 05/23/2021 confirmed small benign bony exostosis versus benign heterotopic ossification overlying the frontal calvarium.  Scalp soft tissues normal.  Job: ***  PMH Significant for: Bony mass left side of forehead, GERD, osteoarthritis, HLD, restless leg syndrome.   Past Medical History: Allergies: Allergies  Allergen Reactions   Codeine Nausea Only    Severe constipation   Dust Mite Mixed Allergen Ext  [Mite (D. Farinae)] Swelling   Penicillins Rash    Current Medications:  Current Outpatient Medications:    atorvastatin (LIPITOR) 20 MG tablet, TAKE 1 TABLET BY MOUTH AT BEDTIME, Disp: 90 tablet, Rfl: 3   citalopram (CELEXA) 20 MG tablet, Take 1 tablet (20 mg total) by mouth daily., Disp: 90 tablet, Rfl: 3   fluticasone (FLONASE) 50 MCG/ACT nasal spray, PLACE 1 SPRAY INTO BOTH NOSTRILS DAILY AS NEEDED FOR ALLERGIES OR RHINITIS., Disp: 16 g, Rfl: 2   meloxicam (MOBIC) 15 MG tablet, TAKE 1 TABLET BY MOUTH DAILY AS NEEDED FOR PAIN, Disp: 90 tablet, Rfl: 1   omeprazole (PRILOSEC) 20 MG capsule, TAKE 1 CAPSULE BY MOUTH ONCE DAILY BEFORE BREAKFAST, Disp: 90 capsule, Rfl: 3   rOPINIRole (REQUIP) 0.5 MG tablet, Take 1  tablet (0.5 mg total) by mouth at bedtime as needed (restless leg)., Disp: 90 tablet, Rfl: 1  Past Medical Problems: Past Medical History:  Diagnosis Date   Allergy    Arthritis    Colon polyps    Depression    GERD (gastroesophageal reflux disease)    Hypercholesteremia    PONV (postoperative nausea and vomiting)    Postmenopausal    Wears glasses     Past Surgical History: Past Surgical History:  Procedure Laterality Date   CESAREAN SECTION  2000   COLONOSCOPY     CONVERSION TO TOTAL KNEE Left 01/13/2018   Procedure: Revision of Left knee unicompartmental arthroplasty to total knee arthroplasty;  Surgeon: [name], MD;  Location: [facility];  Service: Orthopedics;  Laterality: Left;   FOOT ARTHRODESIS  1995   right   HYSTERECTOMY ABDOMINAL WITH SALPINGECTOMY  2005   did not remove ovaries   KNEE ARTHROSCOPY WITH MEDIAL MENISECTOMY Left 05/17/2014   Procedure: LEFT ARTHROSCOPY KNEE WITH PARTIAL MEDIAL MENISECTOMY, CHONDROPLASTY OF LATERAL FEMORAL CONDYLE;  Surgeon: Alta Corning, MD;  Location: [facility];  Service: Orthopedics;  Laterality: Left;   KNEE CLOSED REDUCTION Left 07/21/2018   Procedure: CLOSED MANIPULATION KNEE;  Surgeon: [name], MD;  [facility];  Service: Orthopedics;  Laterality: Left;  79min   PARTIAL KNEE ARTHROPLASTY Left 01/21/2017   Procedure: UNICOMPARTMENTAL LEFT KNEE;  Surgeon: [name], MD;  [facility];  Service: Orthopedics;  Laterality: Left;   Revision left knee     Dr. Wynelle Link 01-13-18   TOTAL KNEE ARTHROPLASTY Right 01/16/2020   Procedure: RIGHT TOTAL KNEE ARTHROPLASTY;  Surgeon: Frederik Pear, MD;  [facility];  Service: Orthopedics;  Laterality: Right;    Social History: Social History   Socioeconomic History   Marital status: Married    Spouse name: Not on file   Number of children: Not on file   Years of education: Not on file   Highest education level: Not on file  Occupational History    Not on file  Tobacco Use   Smoking status: Never   Smokeless tobacco: Never  Vaping Use   Vaping Use: Never used  Substance and Sexual Activity   Alcohol use: Yes    Alcohol/week: 7.0 standard drinks    Types: 7 Shots of liquor per week    Comment: 1 shot daily   Drug use: Never   Sexual activity: Not Currently    Partners: Male  Other Topics Concern   Not on file  Social History Narrative   Not on file   Social Determinants of Health   Financial Resource Strain: Not on file  Food Insecurity: Not on file  Transportation Needs: Not on file  Physical Activity: Not on file  Stress: Not on file  Social Connections: Not on file  Intimate Partner Violence: Not on file    Family History: Family History  Problem Relation Age of Onset   Colon cancer Mother 84       lived 62 months after diagnosis   Colon polyps Sister    Diverticulosis Sister    Diverticulitis Sister    Coronary artery disease Father    Pancreatic cancer Neg Hx    Stomach cancer Neg Hx    Esophageal cancer Neg Hx    Rectal cancer Neg Hx     Review of Systems: ROS  Physical Exam: Vital Signs There were no vitals taken for this visit.  Physical Exam *** Constitutional:      General: Not in acute distress.    Appearance: Normal appearance. Not ill-appearing.  HENT:     Head: Normocephalic and atraumatic.  Eyes:     Pupils: Pupils are equal, round. Cardiovascular:     Rate and Rhythm: Normal rate.    Pulses: Normal pulses.  Pulmonary:     Effort: No respiratory distress or increased work of breathing.  Speaks in full sentences. Abdominal:     General: Abdomen is flat. No distension.   Musculoskeletal: Normal range of motion. No lower extremity swelling or edema. No varicosities. *** Skin:    General: Skin is warm and dry.     Findings: No erythema or rash.  Neurological:     Mental Status: Alert and oriented to person, place, and time.  Psychiatric:        Mood and Affect: Mood normal.         Behavior: Behavior normal.    Assessment/Plan: The patient is scheduled for left forehead bony mass excision with Dr. Erin Hearing.  Risks, benefits, and alternatives of procedure discussed, questions answered and consent obtained.    Smoking Status: ***; Counseling Given? *** Last Mammogram: ***; Results: ***  Caprini Score: ***; Risk Factors include: ***, BMI *** 25, and length of planned surgery. Recommendation for mechanical *** pharmacological prophylaxis. Encourage early ambulation.   Pictures obtained: 05/13/2021  Post-op Rx sent to pharmacy: ***  Patient was provided with the General Surgical Risk consent document  and Pain Medication Agreement prior to their appointment.  They had adequate time to read through the risk consent documents and Pain Medication Agreement. We also discussed them in person together during this preop appointment. All of their questions were answered to their satisfaction.  Recommended calling if they have any further questions.  Risk consent form and Pain Medication Agreement to be scanned into patient's chart.    Electronically signed by: Krista Blue, PA-C 07/22/2021 9:44 AM

## 2021-07-24 ENCOUNTER — Other Ambulatory Visit: Payer: Self-pay

## 2021-07-24 ENCOUNTER — Ambulatory Visit (INDEPENDENT_AMBULATORY_CARE_PROVIDER_SITE_OTHER): Payer: 59 | Admitting: Plastic Surgery

## 2021-07-24 ENCOUNTER — Encounter: Payer: Self-pay | Admitting: Plastic Surgery

## 2021-07-24 ENCOUNTER — Encounter: Payer: 59 | Admitting: Physician Assistant

## 2021-07-24 DIAGNOSIS — D489 Neoplasm of uncertain behavior, unspecified: Secondary | ICD-10-CM

## 2021-07-24 NOTE — Progress Notes (Signed)
CC:  Frontal mass bony     Joanna Willis is an 66 y.o. female.  HPI: The patient is a 66 year old with a bony mass in the left frontal region.  The lesion is to the left of midline.  No headaches or associated symptoms.  She is scheduled to have the lesion removed.  She has an excellent understanding of risks and benefits.       Allergies  Allergen Reactions   Codeine Nausea Only      Severe constipation   Dust Mite Mixed Allergen Ext  [Mite (D. Farinae)] Swelling   Penicillins Rash          Outpatient Encounter Medications as of 05/13/2021  Medication Sig   atorvastatin (LIPITOR) 20 MG tablet TAKE 1 TABLET BY MOUTH AT BEDTIME   azithromycin (ZITHROMAX) 500 MG tablet TAKE 1 TABLET 1 HOUR PRIOR TO PROCEDURE.   citalopram (CELEXA) 20 MG tablet Take 1 tablet (20 mg total) by mouth daily.   fluticasone (FLONASE) 50 MCG/ACT nasal spray PLACE 1 SPRAY INTO BOTH NOSTRILS DAILY AS NEEDED FOR ALLERGIES OR RHINITIS.   meloxicam (MOBIC) 15 MG tablet TAKE 1 TABLET BY MOUTH DAILY AS NEEDED FOR PAIN   omeprazole (PRILOSEC) 20 MG capsule TAKE 1 CAPSULE BY MOUTH ONCE DAILY BEFORE BREAKFAST   rOPINIRole (REQUIP) 0.5 MG tablet Take 1 tablet (0.5 mg total) by mouth at bedtime as needed (restless leg).    No facility-administered encounter medications on file as of 05/13/2021.          Past Medical History:  Diagnosis Date   Allergy     Arthritis     Colon polyps     Depression     GERD (gastroesophageal reflux disease)     Hypercholesteremia     PONV (postoperative nausea and vomiting)     Postmenopausal     Wears glasses             Past Surgical History:  Procedure Laterality Date   CESAREAN SECTION   2000   COLONOSCOPY       CONVERSION TO TOTAL KNEE Left 01/13/2018    Procedure: Revision of Left knee unicompartmental arthroplasty to total knee arthroplasty;  Surgeon: [name], MD;  [facility];  Service: Orthopedics;  Laterality: Left;   FOOT ARTHRODESIS   1995    right    HYSTERECTOMY ABDOMINAL WITH SALPINGECTOMY   2005    did not remove ovaries   KNEE ARTHROSCOPY WITH MEDIAL MENISECTOMY Left 05/17/2014    Procedure: LEFT ARTHROSCOPY KNEE WITH PARTIAL MEDIAL MENISECTOMY, CHONDROPLASTY OF LATERAL FEMORAL CONDYLE;  Surgeon: Alta Corning, MD;  Location: [facility];  Service: Orthopedics;  Laterality: Left;   KNEE CLOSED REDUCTION Left 07/21/2018    Procedure: CLOSED MANIPULATION KNEE;  Surgeon: [name], MD;  [facility];  Service: Orthopedics;  Laterality: Left;  61min   PARTIAL KNEE ARTHROPLASTY Left 01/21/2017    Procedure: UNICOMPARTMENTAL LEFT KNEE;  Surgeon: [name], MD;  [facility];  Service: Orthopedics;  Laterality: Left;   Revision left knee        Dr. Wynelle Link 01-13-18   TOTAL KNEE ARTHROPLASTY Right 01/16/2020    Procedure: RIGHT TOTAL KNEE ARTHROPLASTY;  Surgeon: Frederik Pear, MD;  [facility];  Service: Orthopedics;  Laterality: Right;           Family History  Problem Relation Age of Onset   Colon cancer Mother 54        lived  6 months after diagnosis   Colon polyps Sister     Diverticulosis Sister     Diverticulitis Sister     Coronary artery disease Father     Pancreatic cancer Neg Hx     Stomach cancer Neg Hx     Esophageal cancer Neg Hx     Rectal cancer Neg Hx        Social History       Social History Narrative   Not on file      Review of Systems General: Denies fevers, chills, weight loss CV: Denies chest pain, shortness of breath, palpitations     Physical Exam Vitals with BMI 05/13/2021 04/24/2021 04/24/2021  Height 5\' 6"  - 5' 5.25"  Weight 145 lbs - 147 lbs 6 oz  BMI 22.29 - 79.89  Systolic 211 941 740  Diastolic 86 74 80  Pulse 85 - 66    General:  No acute distress,  Alert and oriented, Non-Toxic, Normal speech and affect HEENT: Patient has a left frontal mass 1.3 x 1 cm.  It is just to the left of center.  Nonmobile and attached to bone. Heent: [place]/AT Chest:   clear, no wheezing CV: RRR Abd: soft, nt/nd Ext:  no clubbing, cyanosis, or edema    Imaging: CT scan: Left frontal mass near midline.  Appears to be a good plane between the bony lesion and calvarium on imaging.   Assessment/Plan Patient with a bony mass of the left frontal region.  We will plan resection of bony lesion.  There appears to be a good plane between the lesion and outer table.  The patient is aware that if there is not a good plane then excision may be more complex.  Risks and benefits were discussed and she agrees to proceed.

## 2021-07-31 ENCOUNTER — Ambulatory Visit (HOSPITAL_BASED_OUTPATIENT_CLINIC_OR_DEPARTMENT_OTHER): Admission: RE | Admit: 2021-07-31 | Payer: Medicare HMO | Source: Home / Self Care | Admitting: Plastic Surgery

## 2021-07-31 SURGERY — EXCISION MASS
Anesthesia: General | Site: Face | Laterality: Left

## 2021-08-09 ENCOUNTER — Encounter: Payer: 59 | Admitting: Plastic Surgery

## 2021-08-29 DIAGNOSIS — M7061 Trochanteric bursitis, right hip: Secondary | ICD-10-CM | POA: Diagnosis not present

## 2021-09-02 ENCOUNTER — Other Ambulatory Visit: Payer: Self-pay

## 2021-09-02 ENCOUNTER — Telehealth (INDEPENDENT_AMBULATORY_CARE_PROVIDER_SITE_OTHER): Payer: Medicare HMO | Admitting: Plastic Surgery

## 2021-09-02 DIAGNOSIS — D489 Neoplasm of uncertain behavior, unspecified: Secondary | ICD-10-CM

## 2021-09-02 NOTE — H&P (View-Only) (Signed)
? ?  Patient ID: Joanna Willis, female    DOB: September 02, 1955, 66 y.o.   MRN: [id number] ? ?No chief complaint on file. ? ? ?  ICD-10-CM   ?1. 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  ? ? ? ?History of Present Illness: ?Joanna Willis is a 66 y.o.  female  with a history of left forehead bony mas.  She presents for preoperative evaluation for upcoming procedure, excision bony mass, scheduled for 09/18/21 with Dr.  Erin Hearing . ? ?The patient has not had problems with anesthesia.  ? ? ? ?PMH Significant for: GERD, knee arthritis ? ? ?Past Medical History: ?Allergies: ?Allergies  ?Allergen Reactions  ? Codeine Nausea Only  ?  Severe constipation  ? Dust Mite Mixed Allergen Ext  [Mite (D. Farinae)] Swelling  ? Penicillins Rash  ? ? ?Current Medications: ? ?Current Outpatient Medications:  ?  atorvastatin (LIPITOR) 20 MG tablet, TAKE 1 TABLET BY MOUTH AT BEDTIME, Disp: 90 tablet, Rfl: 3 ?  citalopram (CELEXA) 20 MG tablet, Take 1 tablet (20 mg total) by mouth daily., Disp: 90 tablet, Rfl: 3 ?  fluticasone (FLONASE) 50 MCG/ACT nasal spray, PLACE 1 SPRAY INTO BOTH NOSTRILS DAILY AS NEEDED FOR ALLERGIES OR RHINITIS., Disp: 16 g, Rfl: 2 ?  meloxicam (MOBIC) 15 MG tablet, TAKE 1 TABLET BY MOUTH DAILY AS NEEDED FOR PAIN, Disp: 90 tablet, Rfl: 1 ?  omeprazole (PRILOSEC) 20 MG capsule, TAKE 1 CAPSULE BY MOUTH ONCE DAILY BEFORE BREAKFAST, Disp: 90 capsule, Rfl: 3 ?  rOPINIRole (REQUIP) 0.5 MG tablet, Take 1 tablet (0.5 mg total) by mouth at bedtime as needed (restless leg)., Disp: 90 tablet, Rfl: 1 ? ?Past Medical Problems: ?Past Medical History:  ?Diagnosis Date  ? Allergy   ? Arthritis   ? Colon polyps   ? Depression   ? GERD (gastroesophageal reflux disease)   ? Hypercholesteremia   ? PONV (postoperative nausea and vomiting)   ? Postmenopausal   ? Wears glasses   ? ? ?Past Surgical History: ?Past Surgical History:  ?Procedure Laterality Date  ? CESAREAN SECTION  2000  ? COLONOSCOPY    ? CONVERSION TO TOTAL KNEE  Left 01/13/2018  ? Procedure: Revision of Left knee unicompartmental arthroplasty to total knee arthroplasty;  Surgeon: [name], MD;  [facility];  Service: Orthopedics;  Laterality: Left;  ? FOOT ARTHRODESIS  1995  ? right  ? HYSTERECTOMY ABDOMINAL WITH SALPINGECTOMY  2005  ? did not remove ovaries  ? KNEE ARTHROSCOPY WITH MEDIAL MENISECTOMY Left 05/17/2014  ? Procedure: LEFT ARTHROSCOPY KNEE WITH PARTIAL MEDIAL MENISECTOMY, CHONDROPLASTY OF LATERAL FEMORAL CONDYLE;  Surgeon: Alta Corning, MD;  Location: [facility];  Service: Orthopedics;  Laterality: Left;  ? KNEE CLOSED REDUCTION Left 07/21/2018  ? Procedure: CLOSED MANIPULATION KNEE;  Surgeon: [name], MD;  [facility];  Service: Orthopedics;  Laterality: Left;  18mn  ? PARTIAL KNEE ARTHROPLASTY Left 01/21/2017  ? Procedure: UNICOMPARTMENTAL LEFT KNEE;  Surgeon: AGaynelle Arabian MD;  [facility];  Service: Orthopedics;  Laterality: Left;  ? Revision left knee    ? Dr. AWynelle Link7-24-19  ? TOTAL KNEE ARTHROPLASTY Right 01/16/2020  ? Procedure: RIGHT TOTAL KNEE ARTHROPLASTY;  Surgeon: RFrederik Pear MD;  [facility];  Service: Orthopedics;  Laterality: Right;  ? ? ?Social History: ?Social History  ? ?Socioeconomic History  ? Marital status: Married  ?  Spouse name: Not on file  ? Number of children:  Not on file  ? Years of education: Not on file  ? Highest education level: Not on file  ?Occupational History  ? Not on file  ?Tobacco Use  ? Smoking status: Never  ? Smokeless tobacco: Never  ?Vaping Use  ? Vaping Use: Never used  ?Substance and Sexual Activity  ? Alcohol use: Yes  ?  Alcohol/week: 7.0 standard drinks  ?  Types: 7 Shots of liquor per week  ?  Comment: 1 shot daily  ? Drug use: Never  ? Sexual activity: Not Currently  ?  Partners: Male  ?Other Topics Concern  ? Not on file  ?Social History Narrative  ? Not on file  ? ?Social Determinants of Health  ? ?Financial Resource Strain: Not on file  ?Food  Insecurity: Not on file  ?Transportation Needs: Not on file  ?Physical Activity: Not on file  ?Stress: Not on file  ?Social Connections: Not on file  ?Intimate Partner Violence: Not on file  ? ? ?Family History: ?Family History  ?Problem Relation Age of Onset  ? Colon cancer Mother 67  ?     lived 83 months after diagnosis  ? Colon polyps Sister   ? Diverticulosis Sister   ? Diverticulitis Sister   ? Coronary artery disease Father   ? Pancreatic cancer Neg Hx   ? Stomach cancer Neg Hx   ? Esophageal cancer Neg Hx   ? Rectal cancer Neg Hx   ? ? ?Review of Systems: ?ROS ? ?Physical Exam: ?Vital Signs ?There were no vitals taken for this visit. ? ?Physical Exam  ?Constitutional:   ?   General: Not in acute distress. ?   Appearance: Normal appearance. Not ill-appearing.  ?HENT:  ?   Head: Normocephalic and atraumatic. Palpable bony lesion left forehead ?Eyes:  ?   Pupils: Pupils are equal, round ?Neck:  ?   Musculoskeletal: Normal range of motion.  ?Cardiovascular:  ?   Rate and Rhythm: Normal rate ?   Pulses: Normal pulses.  ?Pulmonary:  ?   Effort: Pulmonary effort is normal. No respiratory distress.  ?Abdominal:  ?   General: Abdomen is flat. There is no distension.  ?Musculoskeletal: Normal range of motion.  ?Skin: ?   General: Skin is warm and dry.  ?   Findings: No erythema or rash.  ?Neurological:  ?   General: No focal deficit present.  ?   Mental Status: Alert and oriented to person, place, and time. Mental status is at baseline.  ?   Motor: No weakness.  ?Psychiatric:     ?   Mood and Affect: Mood normal.     ?   Behavior: Behavior normal.  ? ? ?Assessment/Plan: ?The patient is scheduled for 09/18/21 with Dr.  Erin Hearing .  Risks, benefits, and alternatives of procedure discussed, questions answered and consent obtained.   ? ?Smoking Status: none;  ? ? ?Caprini Score: 3;  Recommendation for mechanical prophylaxis. Encourage early ambulation.  ? ? ? ?Post-op Rx sent to pharmacy: Not sent since patient does not want  pain medication. ? ?Patient was provided with the  General Surgical Risk consent document and Pain Medication Agreement prior to their appointment.  They had adequate time to read through the risk consent documents and Pain Medication Agreement. We also discussed them in person together during this preop appointment. All of their questions were answered to their satisfaction.  Recommended calling if they have any further questions.  Risk consent form and Pain Medication Agreement to  be scanned into patient's chart. ? ? ? ?Electronically signed by: [name], MD 09/02/2021 10:40 AM ?    ?

## 2021-09-02 NOTE — Progress Notes (Signed)
? ?  Patient ID: Joanna Willis, female    DOB: 27-Sep-1955, 66 y.o.   MRN: [id number] ? ?No chief complaint on file. ? ? ?  ICD-10-CM   ?1. 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  ? ? ? ?History of Present Illness: ?Joanna Willis is a 66 y.o.  female  with a history of left forehead bony mas.  She presents for preoperative evaluation for upcoming procedure, excision bony mass, scheduled for 09/18/21 with Dr.  Erin Hearing . ? ?The patient has not had problems with anesthesia.  ? ? ? ?PMH Significant for: GERD, knee arthritis ? ? ?Past Medical History: ?Allergies: ?Allergies  ?Allergen Reactions  ? Codeine Nausea Only  ?  Severe constipation  ? Dust Mite Mixed Allergen Ext  [Mite (D. Farinae)] Swelling  ? Penicillins Rash  ? ? ?Current Medications: ? ?Current Outpatient Medications:  ?  atorvastatin (LIPITOR) 20 MG tablet, TAKE 1 TABLET BY MOUTH AT BEDTIME, Disp: 90 tablet, Rfl: 3 ?  citalopram (CELEXA) 20 MG tablet, Take 1 tablet (20 mg total) by mouth daily., Disp: 90 tablet, Rfl: 3 ?  fluticasone (FLONASE) 50 MCG/ACT nasal spray, PLACE 1 SPRAY INTO BOTH NOSTRILS DAILY AS NEEDED FOR ALLERGIES OR RHINITIS., Disp: 16 g, Rfl: 2 ?  meloxicam (MOBIC) 15 MG tablet, TAKE 1 TABLET BY MOUTH DAILY AS NEEDED FOR PAIN, Disp: 90 tablet, Rfl: 1 ?  omeprazole (PRILOSEC) 20 MG capsule, TAKE 1 CAPSULE BY MOUTH ONCE DAILY BEFORE BREAKFAST, Disp: 90 capsule, Rfl: 3 ?  rOPINIRole (REQUIP) 0.5 MG tablet, Take 1 tablet (0.5 mg total) by mouth at bedtime as needed (restless leg)., Disp: 90 tablet, Rfl: 1 ? ?Past Medical Problems: ?Past Medical History:  ?Diagnosis Date  ? Allergy   ? Arthritis   ? Colon polyps   ? Depression   ? GERD (gastroesophageal reflux disease)   ? Hypercholesteremia   ? PONV (postoperative nausea and vomiting)   ? Postmenopausal   ? Wears glasses   ? ? ?Past Surgical History: ?Past Surgical History:  ?Procedure Laterality Date  ? CESAREAN SECTION  2000  ? COLONOSCOPY    ? CONVERSION TO TOTAL KNEE  Left 01/13/2018  ? Procedure: Revision of Left knee unicompartmental arthroplasty to total knee arthroplasty;  Surgeon: [name], MD;  Location: [facility];  Service: Orthopedics;  Laterality: Left;  ? FOOT ARTHRODESIS  1995  ? right  ? HYSTERECTOMY ABDOMINAL WITH SALPINGECTOMY  2005  ? did not remove ovaries  ? KNEE ARTHROSCOPY WITH MEDIAL MENISECTOMY Left 05/17/2014  ? Procedure: LEFT ARTHROSCOPY KNEE WITH PARTIAL MEDIAL MENISECTOMY, CHONDROPLASTY OF LATERAL FEMORAL CONDYLE;  Surgeon: Alta Corning, MD;  Location: [facility];  Service: Orthopedics;  Laterality: Left;  ? KNEE CLOSED REDUCTION Left 07/21/2018  ? Procedure: CLOSED MANIPULATION KNEE;  Surgeon: [name], MD;  Location: [facility];  Service: Orthopedics;  Laterality: Left;  58mn  ? PARTIAL KNEE ARTHROPLASTY Left 01/21/2017  ? Procedure: UNICOMPARTMENTAL LEFT KNEE;  Surgeon: AGaynelle Arabian MD;  Location: [facility];  Service: Orthopedics;  Laterality: Left;  ? Revision left knee    ? Dr. AWynelle [id number]  ? TOTAL KNEE ARTHROPLASTY Right 01/16/2020  ? Procedure: RIGHT TOTAL KNEE ARTHROPLASTY;  Surgeon: RFrederik Pear MD;  Location: [facility];  Service: Orthopedics;  Laterality: Right;  ? ? ?Social History: ?Social History  ? ?Socioeconomic History  ? Marital status: Married  ?  Spouse name: Not on file  ? Number of children:  Not on file  ? Years of education: Not on file  ? Highest education level: Not on file  ?Occupational History  ? Not on file  ?Tobacco Use  ? Smoking status: Never  ? Smokeless tobacco: Never  ?Vaping Use  ? Vaping Use: Never used  ?Substance and Sexual Activity  ? Alcohol use: Yes  ?  Alcohol/week: 7.0 standard drinks  ?  Types: 7 Shots of liquor per week  ?  Comment: 1 shot daily  ? Drug use: Never  ? Sexual activity: Not Currently  ?  Partners: Male  ?Other Topics Concern  ? Not on file  ?Social History Narrative  ? Not on file  ? ?Social Determinants of Health  ? ?Financial Resource Strain: Not on file  ?Food  Insecurity: Not on file  ?Transportation Needs: Not on file  ?Physical Activity: Not on file  ?Stress: Not on file  ?Social Connections: Not on file  ?Intimate Partner Violence: Not on file  ? ? ?Family History: ?Family History  ?Problem Relation Age of Onset  ? Colon cancer Mother 39  ?     lived 29 months after diagnosis  ? Colon polyps Sister   ? Diverticulosis Sister   ? Diverticulitis Sister   ? Coronary artery disease Father   ? Pancreatic cancer Neg Hx   ? Stomach cancer Neg Hx   ? Esophageal cancer Neg Hx   ? Rectal cancer Neg Hx   ? ? ?Review of Systems: ?ROS ? ?Physical Exam: ?Vital Signs ?There were no vitals taken for this visit. ? ?Physical Exam  ?Constitutional:   ?   General: Not in acute distress. ?   Appearance: Normal appearance. Not ill-appearing.  ?HENT:  ?   Head: Normocephalic and atraumatic. Palpable bony lesion left forehead ?Eyes:  ?   Pupils: Pupils are equal, round ?Neck:  ?   Musculoskeletal: Normal range of motion.  ?Cardiovascular:  ?   Rate and Rhythm: Normal rate ?   Pulses: Normal pulses.  ?Pulmonary:  ?   Effort: Pulmonary effort is normal. No respiratory distress.  ?Abdominal:  ?   General: Abdomen is flat. There is no distension.  ?Musculoskeletal: Normal range of motion.  ?Skin: ?   General: Skin is warm and dry.  ?   Findings: No erythema or rash.  ?Neurological:  ?   General: No focal deficit present.  ?   Mental Status: Alert and oriented to person, place, and time. Mental status is at baseline.  ?   Motor: No weakness.  ?Psychiatric:     ?   Mood and Affect: Mood normal.     ?   Behavior: Behavior normal.  ? ? ?Assessment/Plan: ?The patient is scheduled for 09/18/21 with Dr.  Erin Hearing .  Risks, benefits, and alternatives of procedure discussed, questions answered and consent obtained.   ? ?Smoking Status: none;  ? ? ?Caprini Score: 3;  Recommendation for mechanical prophylaxis. Encourage early ambulation.  ? ? ? ?Post-op Rx sent to pharmacy: Not sent since patient does not want  pain medication. ? ?Patient was provided with the  General Surgical Risk consent document and Pain Medication Agreement prior to their appointment.  They had adequate time to read through the risk consent documents and Pain Medication Agreement. We also discussed them in person together during this preop appointment. All of their questions were answered to their satisfaction.  Recommended calling if they have any further questions.  Risk consent form and Pain Medication Agreement to  be scanned into patient's chart. ? ? ? ?Electronically signed by: [name], MD 09/02/2021 10:40 AM ?    ?

## 2021-09-04 NOTE — Progress Notes (Signed)
Surgical Instructions ? ? ? Your procedure is scheduled on September 18, 2021. ? Report to [facility] Main Entrance "A" at 9:30 A.M., then check in with the Admitting office. ? Call this number if you have problems the morning of surgery: ? [phone] ? ? If you have any questions prior to your surgery date call [phone]: Open Monday-Friday 8am-4pm ? ? Remember: ? Do not eat after midnight the night before your surgery ? ?You may drink clear liquids until 8:30 the morning of your surgery.   ?Clear liquids allowed are: Water, Non-Citrus Juices (without pulp), Carbonated Beverages, Clear Tea, Black Coffee ONLY (NO MILK, CREAM OR POWDERED CREAMER of any kind), and Gatorade ?  ? Take these medicines the morning of surgery with A SIP OF WATER:  ?Citalopram (Celexa) ?Omeprazole (Prilosec) ? ?If needed: ?Fluticasone (Flonase) nasal spray ? ?As of today, STOP taking any Aspirin (unless otherwise instructed by your surgeon) Aleve, Meloxicam, (MOBIC), Naproxen, Ibuprofen, Motrin, Advil, Goody's, BC's, all herbal medications, fish oil, and all vitamins. ? ?         ?Do not wear jewelry or makeup ?Do not wear lotions, powders, perfumes, or deodorant. ?Do not shave 48 hours prior to surgery.   ?Do not bring valuables to the hospital. ?Do not wear nail polish, gel polish, artificial nails, or any other type of covering on natural nails (fingers and toes) ?If you have artificial nails or gel coating that need to be removed by a nail salon, please have this removed prior to surgery. Artificial nails or gel coating may interfere with anesthesia's ability to adequately monitor your vital signs. ? ?[facility] is not responsible for any belongings or valuables. .  ? ?Do NOT Smoke (Tobacco/Vaping)  24 hours prior to your procedure ? ?If you use a CPAP at night, you may bring your mask for your overnight stay. ?  ?Contacts, glasses, hearing aids, dentures or partials may not be worn into surgery, please bring cases for these  belongings ?  ?For patients admitted to the hospital, discharge time will be determined by your treatment team. ?  ?Patients discharged the day of surgery will not be allowed to drive home, and someone needs to stay with them for 24 hours. ? ?NO VISITORS WILL BE ALLOWED IN PRE-OP WHERE PATIENTS ARE PREPPED FOR SURGERY.  ONLY 1 SUPPORT PERSON MAY BE PRESENT IN THE WAITING ROOM WHILE YOU ARE IN SURGERY.  IF YOU ARE TO BE ADMITTED, ONCE YOU ARE IN YOUR ROOM YOU WILL BE ALLOWED TWO (2) VISITORS. 1 (ONE) VISITOR MAY STAY OVERNIGHT BUT MUST ARRIVE TO THE ROOM BY 8pm.  Minor children may have two parents present. Special consideration for safety and communication needs will be reviewed on a case by case basis. ? ?Special instructions:   ? ?Oral Hygiene is also important to reduce your risk of infection.  Remember - BRUSH YOUR TEETH THE MORNING OF SURGERY WITH YOUR REGULAR TOOTHPASTE ? ? ?[facility]- Preparing For Surgery ? ?Before surgery, you can play an important role. Because skin is not sterile, your skin needs to be as free of germs as possible. You can reduce the number of germs on your skin by washing with CHG (chlorahexidine gluconate) Soap before surgery.  CHG is an antiseptic cleaner which kills germs and bonds with the skin to continue killing germs even after washing.   ? ? ?Please do not use if you have an allergy to CHG or antibacterial soaps. If your skin becomes reddened/irritated stop  using the CHG.  ?Do not shave (including legs and underarms) for at least 48 hours prior to first CHG shower. It is OK to shave your face. ? ?Please follow these instructions carefully. ?  ? ? Shower the NIGHT BEFORE SURGERY and the MORNING OF SURGERY with CHG Soap.  ? If you chose to wash your hair, wash your hair first as usual with your normal shampoo. After you shampoo, rinse your hair and body thoroughly to remove the shampoo.  Then [employer] and genitals (private parts) with your normal soap and rinse thoroughly to  remove soap. ? ?After that Use CHG Soap as you would any other liquid soap. You can apply CHG directly to the skin and wash gently with a scrungie or a clean washcloth.  ? ?Apply the CHG Soap to your body ONLY FROM THE NECK DOWN.  Do not use on open wounds or open sores. Avoid contact with your eyes, ears, mouth and genitals (private parts). Wash Face and genitals (private parts)  with your normal soap.  ? ?Wash thoroughly, paying special attention to the area where your surgery will be performed. ? ?Thoroughly rinse your body with warm water from the neck down. ? ?DO NOT shower/wash with your normal soap after using and rinsing off the CHG Soap. ? ?Pat yourself dry with a CLEAN TOWEL. ? ?Wear CLEAN PAJAMAS to bed the night before surgery ? ?Place CLEAN SHEETS on your bed the night before your surgery ? ?DO NOT SLEEP WITH PETS. ? ? ?Day of Surgery: ? ?Take a shower with CHG soap. ?Wear Clean/Comfortable clothing the morning of surgery ?Do not apply any deodorants/lotions.   ?Remember to brush your teeth WITH YOUR REGULAR TOOTHPASTE. ? ? ? ?COVID testing OR BEFORE SURGERY ? ?If you are going to stay overnight or be admitted after your procedure/surgery and require a pre-op COVID test, please follow these instructions after your COVID test  ? ?You are not required to quarantine however you are required to wear a well-fitting mask when you are out and around people not in your household.  If your mask becomes wet or soiled, replace with a new one. ? ?Wash your hands often with soap and water for 20 seconds or clean your hands with an alcohol-based hand sanitizer that contains at least 60% alcohol. ? ?Do not share personal items. ? ?Notify your provider: ?if you are in close contact with someone who has COVID  ?or if you develop a fever of 100.4 or greater, sneezing, cough, sore throat, shortness of breath or body aches. ? ?  ?Please read over the following fact sheets that you were given.  ? ?

## 2021-09-05 ENCOUNTER — Encounter (HOSPITAL_COMMUNITY)
Admission: RE | Admit: 2021-09-05 | Discharge: 2021-09-05 | Disposition: A | Discharge status: Home / Self Care | Payer: Medicare HMO | Source: Ambulatory Visit | Attending: Plastic Surgery | Admitting: Plastic Surgery

## 2021-09-05 ENCOUNTER — Other Ambulatory Visit: Payer: Self-pay

## 2021-09-05 ENCOUNTER — Encounter (HOSPITAL_COMMUNITY): Payer: Self-pay

## 2021-09-05 DIAGNOSIS — Z01812 Encounter for preprocedural laboratory examination: Secondary | ICD-10-CM | POA: Diagnosis present

## 2021-09-05 LAB — CBC
HCT: 44.5 % (ref 36.0–46.0)
Hemoglobin: 14.3 g/dL (ref 12.0–15.0)
MCH: 30.4 pg (ref 26.0–34.0)
MCHC: 32.1 g/dL (ref 30.0–36.0)
MCV: 94.5 fL (ref 80.0–100.0)
Platelets: 279 10*3/uL (ref 150–400)
RBC: 4.71 MIL/uL (ref 3.87–5.11)
RDW: 12.4 % (ref 11.5–15.5)
WBC: 5.2 10*3/uL (ref 4.0–10.5)
nRBC: 0 % (ref 0.0–0.2)

## 2021-09-05 NOTE — Progress Notes (Signed)
Surgical Instructions ? ? ? Your procedure is scheduled on September 18, 2021. ? Report to [facility] Main Entrance "A" at 9:30 A.M., then check in with the Admitting office. ? Call this number if you have problems the morning of surgery: ? [phone] ? ? If you have any questions prior to your surgery date call [phone]: Open Monday-Friday 8am-4pm ? ? Remember: ? Do not eat after midnight the night before your surgery ? ?You may drink clear liquids until 8:30 the morning of your surgery.   ?Clear liquids allowed are: Water, Non-Citrus Juices (without pulp), Carbonated Beverages, Clear Tea, Black Coffee ONLY (NO MILK, CREAM OR POWDERED CREAMER of any kind), and Gatorade ?  ? Take these medicines the morning of surgery with A SIP OF WATER:  ?Omeprazole (Prilosec) ? ?If needed: ?Fluticasone (Flonase) nasal spray ? ?As of today, STOP taking any Aspirin (unless otherwise instructed by your surgeon) Aleve, Meloxicam, (MOBIC), Naproxen, Ibuprofen, Motrin, Advil, Goody's, BC's, all herbal medications, fish oil, and all vitamins. ? ?         ?Do not wear jewelry or makeup ?Do not wear lotions, powders, perfumes, or deodorant. ?Do not shave 48 hours prior to surgery.   ?Do not bring valuables to the hospital. ?Do not wear nail polish, gel polish, artificial nails, or any other type of covering on natural nails (fingers and toes) ?If you have artificial nails or gel coating that need to be removed by a nail salon, please have this removed prior to surgery. Artificial nails or gel coating may interfere with anesthesia's ability to adequately monitor your vital signs. ? ?[facility] is not responsible for any belongings or valuables. .  ? ?Do NOT Smoke (Tobacco/Vaping)  24 hours prior to your procedure ? ?If you use a CPAP at night, you may bring your mask for your overnight stay. ?  ?Contacts, glasses, hearing aids, dentures or partials may not be worn into surgery, please bring cases for these belongings ?  ?For patients  admitted to the hospital, discharge time will be determined by your treatment team. ?  ?Patients discharged the day of surgery will not be allowed to drive home, and someone needs to stay with them for 24 hours. ? ?NO VISITORS WILL BE ALLOWED IN PRE-OP WHERE PATIENTS ARE PREPPED FOR SURGERY.  ONLY 1 SUPPORT PERSON MAY BE PRESENT IN THE WAITING ROOM WHILE YOU ARE IN SURGERY.  IF YOU ARE TO BE ADMITTED, ONCE YOU ARE IN YOUR ROOM YOU WILL BE ALLOWED TWO (2) VISITORS. 1 (ONE) VISITOR MAY STAY OVERNIGHT BUT MUST ARRIVE TO THE ROOM BY 8pm.  Minor children may have two parents present. Special consideration for safety and communication needs will be reviewed on a case by case basis. ? ?Special instructions:   ? ?Oral Hygiene is also important to reduce your risk of infection.  Remember - BRUSH YOUR TEETH THE MORNING OF SURGERY WITH YOUR REGULAR TOOTHPASTE ? ? ?[facility]- Preparing For Surgery ? ?Before surgery, you can play an important role. Because skin is not sterile, your skin needs to be as free of germs as possible. You can reduce the number of germs on your skin by washing with CHG (chlorahexidine gluconate) Soap before surgery.  CHG is an antiseptic cleaner which kills germs and bonds with the skin to continue killing germs even after washing.   ? ? ?Please do not use if you have an allergy to CHG or antibacterial soaps. If your skin becomes reddened/irritated stop using the  CHG.  ?Do not shave (including legs and underarms) for at least 48 hours prior to first CHG shower. It is OK to shave your face. ? ?Please follow these instructions carefully. ?  ? ? Shower the NIGHT BEFORE SURGERY and the MORNING OF SURGERY with CHG Soap.  ? If you chose to wash your hair, wash your hair first as usual with your normal shampoo. After you shampoo, rinse your hair and body thoroughly to remove the shampoo.  Then [employer] and genitals (private parts) with your normal soap and rinse thoroughly to remove soap. ? ?After that Use  CHG Soap as you would any other liquid soap. You can apply CHG directly to the skin and wash gently with a scrungie or a clean washcloth.  ? ?Apply the CHG Soap to your body ONLY FROM THE NECK DOWN.  Do not use on open wounds or open sores. Avoid contact with your eyes, ears, mouth and genitals (private parts). Wash Face and genitals (private parts)  with your normal soap.  ? ?Wash thoroughly, paying special attention to the area where your surgery will be performed. ? ?Thoroughly rinse your body with warm water from the neck down. ? ?DO NOT shower/wash with your normal soap after using and rinsing off the CHG Soap. ? ?Pat yourself dry with a CLEAN TOWEL. ? ?Wear CLEAN PAJAMAS to bed the night before surgery ? ?Place CLEAN SHEETS on your bed the night before your surgery ? ?DO NOT SLEEP WITH PETS. ? ? ?Day of Surgery: ? ?Take a shower with CHG soap. ?Wear Clean/Comfortable clothing the morning of surgery ?Do not apply any deodorants/lotions.   ?Remember to brush your teeth WITH YOUR REGULAR TOOTHPASTE. ? ? ?Please read over the following fact sheets that you were given.  ? ?

## 2021-09-05 NOTE — Progress Notes (Signed)
PCP - Samuel Bouche, NP ?Cardiologist - denies ? ?PPM/ICD - n/a ? ?Chest x-ray - n/a ?EKG - n/a ?Stress Test - denies ?ECHO - denies ?Cardiac Cath - denies ? ?Sleep Study - denies ?CPAP - denies ? ?Blood Thinner Instructions: n/a ?Aspirin Instructions: n/a ? ?ERAS Protcol - Clear liquids until 0830 DOS ?PRE-SURGERY Ensure or G2- none ordered. ? ?COVID TEST- n/a ? ?Anesthesia review: No ? ?Patient denies shortness of breath, fever, cough and chest pain at PAT appointment ? ? ?All instructions explained to the patient, with a verbal understanding of the material. Patient agrees to go over the instructions while at home for a better understanding. Patient also instructed to self quarantine after being tested for COVID-19. The opportunity to ask questions was provided. ? ? ?

## 2021-09-17 NOTE — Anesthesia Preprocedure Evaluation (Addendum)
Anesthesia Evaluation  ?Patient identified by MRN, date of birth, ID band ?Patient awake ? ? ? ?Reviewed: ?Allergy & Precautions, NPO status , Patient's Chart, lab work & pertinent test results ? ?History of Anesthesia Complications ?(+) PONV and history of anesthetic complications (usually the day after surgery) ? ?Airway ?Mallampati: III ? ?TM Distance: >3 FB ?Neck ROM: Full ? ? ? Dental ? ?(+) Teeth Intact, Dental Advisory Given ?  ?Pulmonary ? ?Snores at night, no witnessed apneas, no sleep study in the past  ?  ?Pulmonary exam normal ?breath sounds clear to auscultation ? ? ? ? ? ? Cardiovascular ?negative cardio ROS ?Normal cardiovascular exam ?Rhythm:Regular Rate:Normal ? ? ?  ?Neuro/Psych ?PSYCHIATRIC DISORDERS Depression negative neurological ROS ?   ? GI/Hepatic ?Neg liver ROS, GERD  Medicated and Controlled,  ?Endo/Other  ?negative endocrine ROS ? Renal/GU ?negative Renal ROS  ?negative genitourinary ?  ?Musculoskeletal ? ?(+) Arthritis , Osteoarthritis,   ? Abdominal ?  ?Peds ? Hematology ?negative hematology ROS ?(+) hct 44.5   ?Anesthesia Other Findings ? ? Reproductive/Obstetrics ?negative OB ROS ? ?  ? ? ? ? ? ? ? ? ? ? ? ? ? ?  ?  ? ? ? ? ? ? ? ?Anesthesia Physical ?Anesthesia Plan ? ?ASA: 2 ? ?Anesthesia Plan: General  ? ?Post-op Pain Management: Tylenol PO (pre-op)*  ? ?Induction: Intravenous ? ?PONV Risk Score and Plan: 4 or greater and Ondansetron, Dexamethasone, Midazolam, Treatment may vary due to age or medical condition and Scopolamine patch - Pre-op ? ?Airway Management Planned: LMA ? ?Additional Equipment: None ? ?Intra-op Plan:  ? ?Post-operative Plan: Extubation in OR ? ?Informed Consent: I have reviewed the patients History and Physical, chart, labs and discussed the procedure including the risks, benefits and alternatives for the proposed anesthesia with the patient or authorized representative who has indicated his/her understanding and acceptance.   ? ? ? ?Dental advisory given ? ?Plan Discussed with: CRNA ? ?Anesthesia Plan Comments:   ? ? ? ? ?Anesthesia Quick Evaluation ? ?

## 2021-09-18 ENCOUNTER — Ambulatory Visit (HOSPITAL_COMMUNITY): Payer: Medicare HMO | Admitting: Anesthesiology

## 2021-09-18 ENCOUNTER — Encounter (HOSPITAL_COMMUNITY): Payer: Self-pay | Admitting: Plastic Surgery

## 2021-09-18 ENCOUNTER — Other Ambulatory Visit: Payer: Self-pay

## 2021-09-18 ENCOUNTER — Ambulatory Visit (HOSPITAL_BASED_OUTPATIENT_CLINIC_OR_DEPARTMENT_OTHER): Payer: Medicare HMO | Admitting: Anesthesiology

## 2021-09-18 ENCOUNTER — Encounter (HOSPITAL_COMMUNITY)
Admission: RE | Disposition: A | Discharge status: Home / Self Care | Payer: Self-pay | Source: Home / Self Care | Attending: Plastic Surgery

## 2021-09-18 ENCOUNTER — Ambulatory Visit (HOSPITAL_COMMUNITY)
Admission: RE | Admit: 2021-09-18 | Discharge: 2021-09-18 | Disposition: A | Discharge status: Home / Self Care | Payer: Medicare HMO | Attending: Plastic Surgery | Admitting: Plastic Surgery

## 2021-09-18 DIAGNOSIS — K219 Gastro-esophageal reflux disease without esophagitis: Secondary | ICD-10-CM | POA: Diagnosis not present

## 2021-09-18 DIAGNOSIS — D489 Neoplasm of uncertain behavior, unspecified: Secondary | ICD-10-CM | POA: Diagnosis present

## 2021-09-18 DIAGNOSIS — D164 Benign neoplasm of bones of skull and face: Secondary | ICD-10-CM | POA: Diagnosis not present

## 2021-09-18 DIAGNOSIS — M8938 Hypertrophy of bone, other site: Secondary | ICD-10-CM | POA: Diagnosis not present

## 2021-09-18 DIAGNOSIS — R220 Localized swelling, mass and lump, head: Secondary | ICD-10-CM

## 2021-09-18 DIAGNOSIS — M899 Disorder of bone, unspecified: Secondary | ICD-10-CM | POA: Insufficient documentation

## 2021-09-18 DIAGNOSIS — M179 Osteoarthritis of knee, unspecified: Secondary | ICD-10-CM | POA: Insufficient documentation

## 2021-09-18 HISTORY — PX: EXCISION MASS HEAD: SHX6702

## 2021-09-18 SURGERY — EXCISION, MASS, HEAD
Anesthesia: General | Site: Head | Laterality: Left

## 2021-09-18 MED ORDER — SCOPOLAMINE 1 MG/3DAYS TD PT72
1.0000 | MEDICATED_PATCH | TRANSDERMAL | Status: DC
  Administered 2021-09-18: 1.5 mg via TRANSDERMAL
  Filled 2021-09-18: qty 1

## 2021-09-18 MED ORDER — FENTANYL CITRATE (PF) 250 MCG/5ML IJ SOLN
INTRAMUSCULAR | Status: AC
  Filled 2021-09-18: qty 5

## 2021-09-18 MED ORDER — ONDANSETRON HCL 4 MG/2ML IJ SOLN
INTRAMUSCULAR | Status: DC | PRN
  Administered 2021-09-18: 4 mg via INTRAVENOUS

## 2021-09-18 MED ORDER — 0.9 % SODIUM CHLORIDE (POUR BTL) OPTIME
TOPICAL | Status: DC | PRN
  Administered 2021-09-18: 1000 mL

## 2021-09-18 MED ORDER — MIDAZOLAM HCL 2 MG/2ML IJ SOLN
INTRAMUSCULAR | Status: AC
  Filled 2021-09-18: qty 2

## 2021-09-18 MED ORDER — ORAL CARE MOUTH RINSE: 15.0000 mL | Freq: Once | OROMUCOSAL | Status: AC

## 2021-09-18 MED ORDER — BUPIVACAINE-EPINEPHRINE (PF) 0.25% -1:200000 IJ SOLN
INTRAMUSCULAR | Status: DC | PRN
  Administered 2021-09-18: 13 mL

## 2021-09-18 MED ORDER — PROPOFOL 10 MG/ML IV BOLUS
INTRAVENOUS | Status: DC | PRN
  Administered 2021-09-18: 150 mg via INTRAVENOUS

## 2021-09-18 MED ORDER — THROMBIN (RECOMBINANT) 5000 UNITS EX SOLR
CUTANEOUS | Status: AC
  Filled 2021-09-18: qty 5000

## 2021-09-18 MED ORDER — DEXAMETHASONE SODIUM PHOSPHATE 10 MG/ML IJ SOLN
INTRAMUSCULAR | Status: DC | PRN
  Administered 2021-09-18: 5 mg via INTRAVENOUS

## 2021-09-18 MED ORDER — EPHEDRINE SULFATE-NACL 50-0.9 MG/10ML-% IV SOSY
PREFILLED_SYRINGE | INTRAVENOUS | Status: DC | PRN
  Administered 2021-09-18 (×4): 5 mg via INTRAVENOUS

## 2021-09-18 MED ORDER — BUPIVACAINE-EPINEPHRINE (PF) 0.25% -1:200000 IJ SOLN
INTRAMUSCULAR | Status: AC
  Filled 2021-09-18: qty 30

## 2021-09-18 MED ORDER — LACTATED RINGERS IV SOLN
INTRAVENOUS | Status: DC
Start: 2021-09-18 — End: 2021-09-18

## 2021-09-18 MED ORDER — ACETAMINOPHEN 500 MG PO TABS
1000.0000 mg | ORAL_TABLET | Freq: Once | ORAL | Status: AC
  Administered 2021-09-18: 1000 mg via ORAL
  Filled 2021-09-18: qty 2

## 2021-09-18 MED ORDER — BACITRACIN ZINC 500 UNIT/GM EX OINT
TOPICAL_OINTMENT | CUTANEOUS | Status: AC
  Filled 2021-09-18: qty 28.35

## 2021-09-18 MED ORDER — MIDAZOLAM HCL 5 MG/5ML IJ SOLN
INTRAMUSCULAR | Status: DC | PRN
  Administered 2021-09-18: 2 mg via INTRAVENOUS

## 2021-09-18 MED ORDER — CHLORHEXIDINE GLUCONATE CLOTH 2 % EX PADS: 6.0000 | MEDICATED_PAD | Freq: Once | CUTANEOUS | Status: DC

## 2021-09-18 MED ORDER — PHENYLEPHRINE 40 MCG/ML (10ML) SYRINGE FOR IV PUSH (FOR BLOOD PRESSURE SUPPORT)
PREFILLED_SYRINGE | INTRAVENOUS | Status: DC | PRN
  Administered 2021-09-18: 40 ug via INTRAVENOUS
  Administered 2021-09-18 (×2): 80 ug via INTRAVENOUS

## 2021-09-18 MED ORDER — CHLORHEXIDINE GLUCONATE CLOTH 2 % EX PADS
6.0000 | MEDICATED_PAD | Freq: Once | CUTANEOUS | Status: DC
Start: 2021-09-18 — End: 2021-09-18

## 2021-09-18 MED ORDER — CHLORHEXIDINE GLUCONATE 0.12 % MT SOLN
15.0000 mL | Freq: Once | OROMUCOSAL | Status: AC
  Administered 2021-09-18: 15 mL via OROMUCOSAL
  Filled 2021-09-18: qty 15

## 2021-09-18 MED ORDER — LIDOCAINE-EPINEPHRINE 1 %-1:100000 IJ SOLN
INTRAMUSCULAR | Status: AC
  Filled 2021-09-18: qty 1

## 2021-09-18 MED ORDER — LACTATED RINGERS IV SOLN
INTRAVENOUS | Status: DC | PRN
Start: 2021-09-18 — End: 2021-09-18

## 2021-09-18 MED ORDER — LIDOCAINE 2% (20 MG/ML) 5 ML SYRINGE
INTRAMUSCULAR | Status: DC | PRN
  Administered 2021-09-18: 60 mg via INTRAVENOUS

## 2021-09-18 MED ORDER — THROMBIN (RECOMBINANT) 20000 UNITS EX SOLR
CUTANEOUS | Status: AC
  Filled 2021-09-18: qty 20000

## 2021-09-18 MED ORDER — CEFAZOLIN SODIUM-DEXTROSE 2-4 GM/100ML-% IV SOLN
2.0000 g | INTRAVENOUS | Status: AC
  Administered 2021-09-18: 2 g via INTRAVENOUS
  Filled 2021-09-18: qty 100

## 2021-09-18 MED ORDER — FENTANYL CITRATE (PF) 100 MCG/2ML IJ SOLN
INTRAMUSCULAR | Status: DC | PRN
  Administered 2021-09-18: 50 ug via INTRAVENOUS

## 2021-09-18 SURGICAL SUPPLY — 40 items
BAG COUNTER SPONGE SURGICOUNT (BAG) ×2 IMPLANT
BAG SPNG CNTER NS LX DISP (BAG) ×1
BNDG GAUZE ELAST 4 BULKY (GAUZE/BANDAGES/DRESSINGS) IMPLANT
CANISTER SUCT 3000ML PPV (MISCELLANEOUS) IMPLANT
CNTNR URN SCR LID CUP LEK RST (MISCELLANEOUS) ×1 IMPLANT
CONT SPEC 4OZ STRL OR WHT (MISCELLANEOUS) ×2
COVER SURGICAL LIGHT HANDLE (MISCELLANEOUS) ×2 IMPLANT
ELECT COATED BLADE 2.86 ST (ELECTRODE) ×2 IMPLANT
ELECT NDL BLADE 2-5/6 (NEEDLE) IMPLANT
ELECT NEEDLE BLADE 2-5/6 (NEEDLE) IMPLANT
ELECT REM PT RETURN 9FT ADLT (ELECTROSURGICAL) ×2
ELECTRODE REM PT RTRN 9FT ADLT (ELECTROSURGICAL) ×1 IMPLANT
GAUZE 4X4 16PLY ~~LOC~~+RFID DBL (SPONGE) IMPLANT
GAUZE SPONGE 4X4 12PLY STRL (GAUZE/BANDAGES/DRESSINGS) IMPLANT
GLOVE SURG ENC MOIS LTX SZ7.5 (GLOVE) ×2 IMPLANT
GOWN STRL REUS W/ TWL LRG LVL3 (GOWN DISPOSABLE) ×1 IMPLANT
GOWN STRL REUS W/ TWL XL LVL3 (GOWN DISPOSABLE) ×1 IMPLANT
GOWN STRL REUS W/TWL LRG LVL3 (GOWN DISPOSABLE) ×2
GOWN STRL REUS W/TWL XL LVL3 (GOWN DISPOSABLE) ×2
KIT BASIN OR (CUSTOM PROCEDURE TRAY) ×2 IMPLANT
KIT TURNOVER KIT B (KITS) ×2 IMPLANT
NDL 25GX 5/8IN NON SAFETY (NEEDLE) ×1 IMPLANT
NDL 27GX1/2 REG BEVEL ECLIP (NEEDLE) ×1 IMPLANT
NEEDLE 25GX 5/8IN NON SAFETY (NEEDLE) ×2 IMPLANT
NEEDLE 27GX1/2 REG BEVEL ECLIP (NEEDLE) ×2 IMPLANT
NS IRRIG 1000ML POUR BTL (IV SOLUTION) ×2 IMPLANT
PAD ARMBOARD 7.5X6 YLW CONV (MISCELLANEOUS) ×4 IMPLANT
PENCIL SMOKE EVACUATOR (MISCELLANEOUS) ×2 IMPLANT
SUT CHROMIC 4 0 P 3 18 (SUTURE) IMPLANT
SUT CHROMIC 4 0 PS 2 18 (SUTURE) IMPLANT
SUT MON AB 4-0 PC3 18 (SUTURE) IMPLANT
SWAB COLLECTION DEVICE MRSA (MISCELLANEOUS) IMPLANT
SWAB CULTURE ESWAB REG 1ML (MISCELLANEOUS) IMPLANT
SYR BULB IRRIG 60ML STRL (SYRINGE) ×2 IMPLANT
SYR CONTROL 10ML LL (SYRINGE) ×2 IMPLANT
TOWEL GREEN STERILE (TOWEL DISPOSABLE) ×2 IMPLANT
TOWEL GREEN STERILE FF (TOWEL DISPOSABLE) ×2 IMPLANT
TRAY ENT MC OR (CUSTOM PROCEDURE TRAY) ×2 IMPLANT
WATER STERILE IRR 1000ML POUR (IV SOLUTION) ×2 IMPLANT
YANKAUER SUCT BULB TIP NO VENT (SUCTIONS) ×2 IMPLANT

## 2021-09-18 NOTE — Transfer of Care (Signed)
Immediate Anesthesia Transfer of Care Note ? ?Patient: Joanna Willis ? ?Procedure(s) Performed: EXCISION MASS LEFT FOREHEAD (Left: Head) ? ?Patient Location: PACU ? ?Anesthesia Type:General ? ?Level of Consciousness: drowsy and patient cooperative ? ?Airway & Oxygen Therapy: Patient Spontanous Breathing and Patient connected to nasal cannula oxygen ? ?Post-op Assessment: Report given to RN and Post -op Vital signs reviewed and stable ? ?Post vital signs: Reviewed and stable ? ?Last Vitals:  ?Vitals Value Taken Time  ?BP 124/102 09/18/21 1228  ?Temp    ?Pulse 78 09/18/21 1230  ?Resp 9 09/18/21 1230  ?SpO2 100 % 09/18/21 1230  ?Vitals shown include unvalidated device data. ? ?Last Pain:  ?Vitals:  ? 09/18/21 0959  ?TempSrc:   ?PainSc: 0-No pain  ?   ? ?  ? ?Complications: No notable events documented. ?

## 2021-09-18 NOTE — Interval H&P Note (Signed)
History and Physical Interval Note: ? ?09/18/2021 ?11:05 AM ? ?Joanna Willis  has presented today for surgery, with the diagnosis of Neoplasm.  The various methods of treatment have been discussed with the patient and family. After consideration of risks, benefits and other options for treatment, the patient has consented to  Procedure(s): ?EXCISION MASS LEFT FOREHEAD (Left) as a surgical intervention.  The patient's history has been reviewed, patient examined, no change in status, stable for surgery.  I have reviewed the patient's chart and labs.  Questions were answered to the patient's satisfaction.   ? ? ?[name] ? ? ?

## 2021-09-18 NOTE — Anesthesia Procedure Notes (Signed)
Procedure Name: LMA Insertion ?Date/Time: 09/18/2021 11:29 AM ?Performed by: Gwyndolyn Saxon, CRNA ?Pre-anesthesia Checklist: Patient identified, Emergency Drugs available, Suction available and Patient being monitored ?Patient Re-evaluated:Patient Re-evaluated prior to induction ?Oxygen Delivery Method: Circle System Utilized ?Preoxygenation: Pre-oxygenation with 100% oxygen ?Induction Type: IV induction ?Ventilation: Mask ventilation without difficulty ?LMA: LMA inserted ?LMA Size: 4.0 ?Tube size: 4.0 mm ?Number of attempts: 1 ?Airway Equipment and Method: Bite block ?Placement Confirmation: positive ETCO2 ?Tube secured with: Tape ?Dental Injury: Teeth and Oropharynx as per pre-operative assessment  ? ? ? ? ?

## 2021-09-18 NOTE — Anesthesia Postprocedure Evaluation (Signed)
Anesthesia Post Note ? ?Patient: Joanna Willis ? ?Procedure(s) Performed: EXCISION MASS LEFT FOREHEAD (Left: Head) ? ?  ? ?Patient location during evaluation: PACU ?Anesthesia Type: General ?Level of consciousness: awake and alert, oriented and patient cooperative ?Pain management: pain level controlled ?Vital Signs Assessment: post-procedure vital signs reviewed and stable ?Respiratory status: spontaneous breathing, nonlabored ventilation and respiratory function stable ?Cardiovascular status: blood pressure returned to baseline and stable ?Postop Assessment: no apparent nausea or vomiting ?Anesthetic complications: no ? ? ?No notable events documented. ? ?Last Vitals:  ?Vitals:  ? 09/18/21 1244 09/18/21 1249  ?BP: 118/71 117/72  ?Pulse: 72 72  ?Resp: 18 15  ?Temp:  36.5 ?C  ?SpO2: 99% 100%  ?  ?Last Pain:  ?Vitals:  ? 09/18/21 1249  ?TempSrc:   ?PainSc: 0-No pain  ? ? ?  ?  ?  ?  ?  ?  ? ?Jarome Matin Yula Crotwell ? ? ? ? ?

## 2021-09-18 NOTE — Discharge Instructions (Addendum)
Leave dressing on for 24-48 hours ? ?May shower today with head out of water.  May get forehead wet after 48 hours and shower normally. ?

## 2021-09-18 NOTE — Op Note (Signed)
Operative Note  ? ?DATE OF OPERATION: 09/18/2021 ? ?SURGICAL DEPARTMENT: Plastic Surgery ? ?PREOPERATIVE DIAGNOSES:  left forehead bony mass ? ?POSTOPERATIVE DIAGNOSES:  same ? ?PROCEDURE:   ?1)  Excision of left forehead bony mass 48m ?2) Complex closure 1.2 cm with wide local undermining ? ?SURGEON: DMelene Plan Hollan Philipp, MD ? ?ASSISTANT: none ? ?ANESTHESIA:  General.  ? ?COMPLICATIONS: None.  ? ?INDICATIONS FOR PROCEDURE:  ?The patient, Joanna Saidiis a 66y.o. female born on 106-12-1955 is here for treatment of left forehead bony mass. ?MRN: [id number]? ?CONSENT:  ?Informed consent was obtained directly from the patient. Risks, benefits and alternatives were fully discussed. Specific risks including but not limited to bleeding, infection, hematoma, seroma, scarring, pain, contracture, asymmetry, wound healing problems, and need for further surgery were all discussed. The patient did have an ample opportunity to have questions answered to satisfaction.  ? ?DESCRIPTION OF PROCEDURE:  ?The patient was taken to the operating room. SCDs were placed and preop antibiotics were given. General anesthesia was administered.  The patient's operative site was prepped and draped in a sterile fashion. A time out was performed and all information was confirmed to be correct.   ? ?Local anesthetic was injected over the left forehead site.  A 15 blade was used to make a skin incision.  Bovie electrocautery was used to further dissect around the bony lesion.  A freer elevator was used to dissect along the edge of the lesion that appeared stuck on.  There was no easy blunt plane so an osteotome was used to remove the bone flush with the frontal bone.  Following this attention was turned to hemostasis.  The wound was closed in layers with 4-0 Monocryl for deep suture and 5-0 prolene sutures for skin.  Prior to closure the wound was widely undermined. ? ?The patient tolerated the procedure well.  There were no complications. The patient  was allowed to wake from anesthesia, extubated and taken to the recovery room in satisfactory condition.   ?

## 2021-09-19 ENCOUNTER — Encounter (HOSPITAL_COMMUNITY): Payer: Self-pay | Admitting: Plastic Surgery

## 2021-09-20 LAB — SURGICAL PATHOLOGY

## 2021-09-24 NOTE — Progress Notes (Signed)
Patient is a pleasant 66 year old female with PMH of bony mass over left frontal region s/p excision performed 09/18/2021 by Dr. Erin Hearing who presents to clinic for postoperative follow-up. ? ?Surgical pathology is reviewed and specimen obtained is consistent with benign hypertrophic bone, excess ptosis.  No evidence of malignancy.  Reviewed operative note and the closure was performed with Prolene sutures. ? ?Today, patient is doing well.  Feels as though she is healing nicely.  Has not had any complications from her wound.  She has already reviewed the pathology.  Patient is concerned about possible scarring if sutures are to remain in place.  She understands that these are not absorbable sutures. ? ?Physical exam is entirely reassuring.  No areas of dehiscence or wounds noted.  No surrounding cellulitic changes. ? ?Discussed suture removal.  Patient like to proceed.  Sutures removed without complication or difficulty.  No residual Prolene left in place. ? ?Encouraged patient to avoid scrubbing the area and applying only thin coat of Vaseline as needed.  She will transition to scar mitigation cream shortly.  She understands to avoid excessive UV exposure.  Will wear a hat. ? ?No specific follow-up needed.  Patient to call clinic should she develop any questions or concerns. ?

## 2021-09-26 ENCOUNTER — Ambulatory Visit (INDEPENDENT_AMBULATORY_CARE_PROVIDER_SITE_OTHER): Payer: Medicare HMO | Admitting: Physician Assistant

## 2021-09-26 ENCOUNTER — Encounter: Payer: Medicare HMO | Admitting: Physician Assistant

## 2021-09-26 DIAGNOSIS — D489 Neoplasm of uncertain behavior, unspecified: Secondary | ICD-10-CM

## 2021-10-14 ENCOUNTER — Encounter: Payer: Self-pay | Admitting: Medical-Surgical

## 2021-11-13 ENCOUNTER — Other Ambulatory Visit: Payer: Self-pay

## 2021-11-13 MED ORDER — CITALOPRAM HYDROBROMIDE 20 MG PO TABS: 20.0000 mg | ORAL_TABLET | Freq: Every day | ORAL | 3 refills | Status: DC

## 2021-11-25 DIAGNOSIS — Z1231 Encounter for screening mammogram for malignant neoplasm of breast: Secondary | ICD-10-CM | POA: Diagnosis not present

## 2021-11-25 LAB — HM MAMMOGRAPHY

## 2021-11-26 DIAGNOSIS — J300 Vasomotor rhinitis: Secondary | ICD-10-CM | POA: Insufficient documentation

## 2021-11-26 DIAGNOSIS — H6981 Other specified disorders of Eustachian tube, right ear: Secondary | ICD-10-CM | POA: Diagnosis not present

## 2021-12-13 DIAGNOSIS — N6489 Other specified disorders of breast: Secondary | ICD-10-CM | POA: Diagnosis not present

## 2021-12-13 DIAGNOSIS — R922 Inconclusive mammogram: Secondary | ICD-10-CM | POA: Diagnosis not present

## 2021-12-13 DIAGNOSIS — R928 Other abnormal and inconclusive findings on diagnostic imaging of breast: Secondary | ICD-10-CM | POA: Diagnosis not present

## 2022-01-16 ENCOUNTER — Other Ambulatory Visit: Payer: Self-pay | Admitting: Medical-Surgical

## 2022-02-17 ENCOUNTER — Other Ambulatory Visit: Payer: Self-pay | Admitting: Medical-Surgical

## 2022-02-17 DIAGNOSIS — G2581 Restless legs syndrome: Secondary | ICD-10-CM

## 2022-02-21 NOTE — Telephone Encounter (Signed)
Last Office Visit 04/24/2021  Last filled 04/24/2021  Next appointment 04/25/2022  Sent 90 day supply to the pharmacy. No Refills.

## 2022-02-28 ENCOUNTER — Ambulatory Visit (INDEPENDENT_AMBULATORY_CARE_PROVIDER_SITE_OTHER): Payer: Medicare HMO

## 2022-02-28 ENCOUNTER — Ambulatory Visit (INDEPENDENT_AMBULATORY_CARE_PROVIDER_SITE_OTHER): Payer: Medicare HMO | Admitting: Medical-Surgical

## 2022-02-28 ENCOUNTER — Encounter: Payer: Self-pay | Admitting: Medical-Surgical

## 2022-02-28 VITALS — BP 118/73 | HR 62 | Resp 20 | Ht 66.0 in | Wt 152.8 lb

## 2022-02-28 DIAGNOSIS — R6881 Early satiety: Secondary | ICD-10-CM

## 2022-02-28 DIAGNOSIS — R140 Abdominal distension (gaseous): Secondary | ICD-10-CM

## 2022-02-28 DIAGNOSIS — R110 Nausea: Secondary | ICD-10-CM

## 2022-02-28 DIAGNOSIS — E7800 Pure hypercholesterolemia, unspecified: Secondary | ICD-10-CM | POA: Diagnosis not present

## 2022-02-28 NOTE — Progress Notes (Unsigned)
   Established Patient Office Visit  Subjective   Patient ID: Joanna Willis, female   DOB: Jan 28, 1956 Age: 66 y.o. MRN: [id number]   Chief Complaint  Patient presents with   Bloated   HPI Very pleasant 66 year old female presenting today for discussion regarding GI concerns.  Notes that over the last 6 months, she has had intermittent issues with poor appetite, indigestion, abdominal.,  No energy, intermittent headaches.  She has had some intermittent nausea that she considers mild with no vomiting.  Gets full very quickly when eating.  Has noticed that her stools are looser than usual however she has had no black stools or bright red blood in her stool.  She has been taking Prilosec for many years and notes that it works well for her most of the time.  She does occasionally use Tums for any particular reflux symptoms.  Notes that her symptoms do come in what she considers episodes usually last 2 to 4 days.  These episodes have become more frequent and she notes that they are getting longer each time. She does still have her gallbladder.  Has to be very careful with fatty foods. Reports Mounjaro 7.7.  Mention gallstones however this never became an issue.   Objective:    Vitals:   02/28/22 0948  BP: 118/73  Pulse: 62  Resp: 20  Height: '5\' 6"'$  (1.676 m)  Weight: 152 lb 12.8 oz (69.3 kg)  SpO2: 100%  BMI (Calculated): 24.67    Physical Exam Vitals and nursing note reviewed.  Constitutional:      General: She is not in acute distress.    Appearance: Normal appearance. She is not ill-appearing.  HENT:     Head: Normocephalic and atraumatic.  Cardiovascular:     Rate and Rhythm: Normal rate and regular rhythm.     Pulses: Normal pulses.     Heart sounds: Normal heart sounds.  Pulmonary:     Effort: Pulmonary effort is normal. No respiratory distress.     Breath sounds: Normal breath sounds. No wheezing, rhonchi or rales.  Skin:    General: Skin is warm and dry.  Neurological:      Mental Status: She is alert and oriented to person, place, and time.  Psychiatric:        Mood and Affect: Mood normal.        Behavior: Behavior normal.        Thought Content: Thought content normal.        Judgment: Judgment normal.      No results found for this or any previous visit (from the past 24 hour(s)).   {Labs (Optional):23779}  The 10-year ASCVD risk score (Arnett DK, et al., 2019) is: 4.1%   Values used to calculate the score:     Age: 23 years     Sex: Female     Is Non-Hispanic African American: No     Diabetic: No     Tobacco smoker: No     Systolic Blood Pressure: 700 mmHg     Is BP treated: No     HDL Cholesterol: 82 mg/dL     Total Cholesterol: 203 mg/dL   Assessment & Plan:   No problem-specific Assessment & Plan notes found for this encounter.   Return if symptoms worsen or fail to improve.  ___________________________________________ Clearnce Sorrel, DNP, APRN, FNP-BC [facility] [place]

## 2022-02-28 NOTE — Patient Instructions (Signed)
Low-FODMAP Eating Plan  FODMAP stands for fermentable oligosaccharides, disaccharides, monosaccharides, and polyols. These are sugars that are hard for some people to digest. A low-FODMAP eating plan may help some people who have irritable bowel syndrome (IBS) and certain other bowel (intestinal) diseases to manage their symptoms. This meal plan can be complicated to follow. Work with a diet and nutrition specialist (dietitian) to make a low-FODMAP eating plan that is right for you. A dietitian can help make sure that you get enough nutrition from this diet. What are tips for following this plan? Reading food labels Check labels for hidden FODMAPs such as: High-fructose syrup. Honey. Agave. Natural fruit flavors. Onion or garlic powder. Choose low-FODMAP foods that contain 3-4 grams of fiber per serving. Check food labels for serving sizes. Eat only one serving at a time to make sure FODMAP levels stay low. Shopping Shop with a list of foods that are recommended on this diet and make a meal plan. Meal planning Follow a low-FODMAP eating plan for up to 6 weeks, or as told by your health care provider or dietitian. To follow the eating plan: Eliminate high-FODMAP foods from your diet completely. Choose only low-FODMAP foods to eat. You will do this for 2-6 weeks. Gradually reintroduce high-FODMAP foods into your diet one at a time. Most people should wait a few days before introducing the next new high-FODMAP food into their meal plan. Your dietitian can recommend how quickly you may reintroduce foods. Keep a daily record of what and how much you eat and drink. Make note of any symptoms that you have after eating. Review your daily record with a dietitian regularly to identify which foods you can eat and which foods you should avoid. General tips Drink enough fluid each day to keep your urine pale yellow. Avoid processed foods. These often have added sugar and may be high in FODMAPs. Avoid  most dairy products, whole grains, and sweeteners. Work with a dietitian to make sure you get enough fiber in your diet. Avoid high FODMAP foods at meals to manage symptoms. Recommended foods Fruits Bananas, oranges, tangerines, lemons, limes, blueberries, raspberries, strawberries, grapes, cantaloupe, honeydew melon, kiwi, papaya, passion fruit, and pineapple. Limited amounts of dried cranberries, banana chips, and shredded coconut. Vegetables Eggplant, zucchini, cucumber, peppers, green beans, bean sprouts, lettuce, arugula, kale, Swiss chard, spinach, collard greens, bok choy, summer squash, potato, and tomato. Limited amounts of corn, carrot, and sweet potato. Green parts of scallions. Grains Gluten-free grains, such as rice, oats, buckwheat, quinoa, corn, polenta, and millet. Gluten-free pasta, bread, or cereal. Rice noodles. Corn tortillas. Meats and other proteins Unseasoned beef, pork, poultry, or fish. Eggs. Bacon. Tofu (firm) and tempeh. Limited amounts of nuts and seeds, such as almonds, walnuts, brazil nuts, pecans, peanuts, nut butters, pumpkin seeds, chia seeds, and sunflower seeds. Dairy Lactose-free milk, yogurt, and kefir. Lactose-free cottage cheese and ice cream. Non-dairy milks, such as almond, coconut, hemp, and rice milk. Non-dairy yogurt. Limited amounts of goat cheese, brie, mozzarella, parmesan, swiss, and other hard cheeses. Fats and oils Butter-free spreads. Vegetable oils, such as olive, canola, and sunflower oil. Seasoning and other foods Artificial sweeteners with names that do not end in "ol," such as aspartame, saccharine, and stevia. Maple syrup, white table sugar, raw sugar, brown sugar, and molasses. Mayonnaise, soy sauce, and tamari. Fresh basil, coriander, parsley, rosemary, and thyme. Beverages Water and mineral water. Sugar-sweetened soft drinks. Small amounts of orange juice or cranberry juice. Black and green tea. Most dry wines.   Coffee. The items listed  above may not be a complete list of foods and beverages you can eat. Contact a dietitian for more information. Foods to avoid Fruits Fresh, dried, and juiced forms of apple, pear, watermelon, peach, plum, cherries, apricots, blackberries, boysenberries, figs, nectarines, and mango. Avocado. Vegetables Chicory root, artichoke, asparagus, cabbage, snow peas, Brussels sprouts, broccoli, sugar snap peas, mushrooms, celery, and cauliflower. Onions, garlic, leeks, and the white part of scallions. Grains Wheat, including kamut, durum, and semolina. Barley and bulgur. Couscous. Wheat-based cereals. Wheat noodles, bread, crackers, and pastries. Meats and other proteins Fried or fatty meat. Sausage. Cashews and pistachios. Soybeans, baked beans, black beans, chickpeas, kidney beans, fava beans, navy beans, lentils, black-eyed peas, and split peas. Dairy Milk, yogurt, ice cream, and soft cheese. Cream and sour cream. Milk-based sauces. Custard. Buttermilk. Soy milk. Seasoning and other foods Any sugar-free gum or candy. Foods that contain artificial sweeteners such as sorbitol, mannitol, isomalt, or xylitol. Foods that contain honey, high-fructose corn syrup, or agave. Bouillon, vegetable stock, beef stock, and chicken stock. Garlic and onion powder. Condiments made with onion, such as hummus, chutney, pickles, relish, salad dressing, and salsa. Tomato paste. Beverages Chicory-based drinks. Coffee substitutes. Chamomile tea. Fennel tea. Sweet or fortified wines such as port or sherry. Diet soft drinks made with isomalt, mannitol, maltitol, sorbitol, or xylitol. Apple, pear, and mango juice. Juices with high-fructose corn syrup. The items listed above may not be a complete list of foods and beverages you should avoid. Contact a dietitian for more information. Summary FODMAP stands for fermentable oligosaccharides, disaccharides, monosaccharides, and polyols. These are sugars that are hard for some people to  digest. A low-FODMAP eating plan is a short-term diet that helps to ease symptoms of certain bowel diseases. The eating plan usually lasts up to 6 weeks. After that, high-FODMAP foods are reintroduced gradually and one at a time. This can help you find out which foods may be causing symptoms. A low-FODMAP eating plan can be complicated. It is best to work with a dietitian who has experience with this type of plan. This information is not intended to replace advice given to you by your health care provider. Make sure you discuss any questions you have with your health care provider. Document Revised: 10/27/2019 Document Reviewed: 10/27/2019 Elsevier Patient Education  2023 Elsevier Inc.  

## 2022-03-01 LAB — CBC WITH DIFFERENTIAL/PLATELET
Absolute Monocytes: 414 cells/uL (ref 200–950)
Basophils Absolute: 48 cells/uL (ref 0–200)
Basophils Relative: 1.1 %
Eosinophils Absolute: 251 cells/uL (ref 15–500)
Eosinophils Relative: 5.7 %
HCT: 40.9 % (ref 35.0–45.0)
Hemoglobin: 13.9 g/dL (ref 11.7–15.5)
Lymphs Abs: 1566 cells/uL (ref 850–3900)
MCH: 31 pg (ref 27.0–33.0)
MCHC: 34 g/dL (ref 32.0–36.0)
MCV: 91.1 fL (ref 80.0–100.0)
MPV: 9.9 fL (ref 7.5–12.5)
Monocytes Relative: 9.4 %
Neutro Abs: 2121 cells/uL (ref 1500–7800)
Neutrophils Relative %: 48.2 %
Platelets: 253 10*3/uL (ref 140–400)
RBC: 4.49 10*6/uL (ref 3.80–5.10)
RDW: 11.7 % (ref 11.0–15.0)
Total Lymphocyte: 35.6 %
WBC: 4.4 10*3/uL (ref 3.8–10.8)

## 2022-03-01 LAB — COMPLETE METABOLIC PANEL WITH GFR
AG Ratio: 1.8 (calc) (ref 1.0–2.5)
ALT: 17 U/L (ref 6–29)
AST: 18 U/L (ref 10–35)
Albumin: 4.6 g/dL (ref 3.6–5.1)
Alkaline phosphatase (APISO): 66 U/L (ref 37–153)
BUN: 14 mg/dL (ref 7–25)
CO2: 31 mmol/L (ref 20–32)
Calcium: 10 mg/dL (ref 8.6–10.4)
Chloride: 101 mmol/L (ref 98–110)
Creat: 0.7 mg/dL (ref 0.50–1.05)
Globulin: 2.6 g/dL (calc) (ref 1.9–3.7)
Glucose, Bld: 102 mg/dL — ABNORMAL HIGH (ref 65–99)
Potassium: 4.3 mmol/L (ref 3.5–5.3)
Sodium: 140 mmol/L (ref 135–146)
Total Bilirubin: 0.6 mg/dL (ref 0.2–1.2)
Total Protein: 7.2 g/dL (ref 6.1–8.1)
eGFR: 96 mL/min/{1.73_m2} (ref >=60)

## 2022-03-01 LAB — LIPID PANEL
Cholesterol: 198 mg/dL (ref <200)
HDL: 83 mg/dL (ref >=50)
LDL Cholesterol (Calc): 93 mg/dL (calc)
Non-HDL Cholesterol (Calc): 115 mg/dL (calc) (ref <130)
Total CHOL/HDL Ratio: 2.4 (calc) (ref <5.0)
Triglycerides: 127 mg/dL (ref <150)

## 2022-03-01 LAB — LIPASE: Lipase: 22 U/L (ref 7–60)

## 2022-03-01 LAB — TSH: TSH: 1.39 mIU/L (ref 0.40–4.50)

## 2022-03-01 LAB — AMYLASE: Amylase: 34 U/L (ref 21–101)

## 2022-03-07 ENCOUNTER — Other Ambulatory Visit: Payer: Self-pay | Admitting: Medical-Surgical

## 2022-03-07 DIAGNOSIS — R140 Abdominal distension (gaseous): Secondary | ICD-10-CM | POA: Diagnosis not present

## 2022-03-07 DIAGNOSIS — R110 Nausea: Secondary | ICD-10-CM | POA: Diagnosis not present

## 2022-03-07 DIAGNOSIS — R6881 Early satiety: Secondary | ICD-10-CM | POA: Diagnosis not present

## 2022-03-08 ENCOUNTER — Encounter: Payer: Self-pay | Admitting: Medical-Surgical

## 2022-03-12 ENCOUNTER — Encounter: Payer: Self-pay | Admitting: Medical-Surgical

## 2022-03-12 LAB — H. PYLORI BREATH TEST: H. pylori Breath Test: NOT DETECTED

## 2022-03-24 ENCOUNTER — Encounter: Payer: Self-pay | Admitting: Gastroenterology

## 2022-03-24 ENCOUNTER — Ambulatory Visit: Payer: Medicare HMO | Admitting: Nurse Practitioner

## 2022-03-24 ENCOUNTER — Encounter: Payer: Self-pay | Admitting: Nurse Practitioner

## 2022-03-24 VITALS — BP 118/78 | HR 70 | Ht 66.0 in | Wt 155.0 lb

## 2022-03-24 DIAGNOSIS — R110 Nausea: Secondary | ICD-10-CM | POA: Diagnosis not present

## 2022-03-24 DIAGNOSIS — R630 Anorexia: Secondary | ICD-10-CM | POA: Diagnosis not present

## 2022-03-24 DIAGNOSIS — R140 Abdominal distension (gaseous): Secondary | ICD-10-CM | POA: Diagnosis not present

## 2022-03-24 NOTE — Patient Instructions (Addendum)
If you are age 65 or older, your body mass index should be between 23-30. Your Body mass index is 25.02 kg/m. If this is out of the aforementioned range listed, please consider follow up with your Primary Care Provider.  If you are age 67 or younger, your body mass index should be between 19-25. Your Body mass index is 25.02 kg/m. If this is out of the aformentioned range listed, please consider follow up with your Primary Care Provider.   ________________________________________________________  The [facility] providers would like to encourage you to use [facility] to communicate with providers for non-urgent requests or questions.  Due to long hold times on the telephone, sending your provider a message by [facility] may be a faster and more efficient way to get a response.  Please allow 48 business hours for a response.  Please remember that this is for non-urgent requests.  _______________________________________________________   Joanna Willis have been scheduled for an endoscopy. Please follow written instructions given to you at your visit today. If you use inhalers (even only as needed), please bring them with you on the day of your procedure.   Due to recent changes in healthcare laws, you may see the results of your imaging and laboratory studies on MyChart before your provider has had a chance to review them.  We understand that in some cases there may be results that are confusing or concerning to you. Not all laboratory results come back in the same time frame and the provider may be waiting for multiple results in order to interpret others.  Please give [place] 48 hours in order for your provider to thoroughly review all the results before contacting the office for clarification of your results.    It was a pleasure to see you today!  Thank you for trusting me with your gastrointestinal care!

## 2022-03-24 NOTE — Progress Notes (Signed)
Chief Complaint:  nausea, decreased appetite, bloating   Assessment &  Plan   # 66 yo female with 6 months of progressive nausea, decreased appetite, bloating, and malaise. Rule of gastritis,  PUD.  Recent labs unremarkable. H.pylori breath test negative. KUB showing mild-moderate stool in colon   Schedule for EGD. The risks and benefits of EGD with possible biopsies were discussed with the patient who agrees to proceed.  In the interim will try to use Meloxicam very sparingly Continue daily Omeprazole 20 mg q am Consider small bowel biopsies vrs serologies to evaluate for celiac disease Continue daily daily Miralax.   If EGD negative and symptoms persist consider testing / treating empirically for SIBO  # GERD, for the most part symptoms controlled on daily PPI  #Family history colon cancer.  She is up-to-date on her colon cancer screening.  Last colonoscopy May 2021 with removal of a few hyperplastic polyps.  Next colonoscopy due May 2026  HPI   Joanna Willis is a 66 y.o. female known to Dr.  Silverio Decamp with a past medical history significant for GERD, depression, arthritis  See PMH /PSH for additional history   Over the last month Joanna Willis has been having "stomach issues ".  She describes intermittent diarrhea, nausea, abdominal bloating ( lower) .  She cannot relate the symptoms anything that she is eating.  No reported weight loss  She has not recently started any new medications.  While waiting to get in to see [place] she saw her PCP for these symptoms.  Labs were unremarkable.  KUB suggested mild to moderate amount of stool in colon.  PCP started her on MiraLAX and she has since been having a daily bowel movement without any improvement in her symptoms.  She also had a recent H. pylori breath test which was negative.  She notes that while being off PPI she had recurrent GERD symptoms and is happy to be back on treatment.  She complains of general malaise.  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  Amyiah has taken meloxicam on a regular basis for years.  Previous GI Evaluation  May 2021 colonoscopy for family history of colon cancer - One 8 mm polyp in the rectum, removed with a cold snare. Resected and retrieved. - Two 1 to 2 mm polyps in the rectum and in the transverse colon, removed with a cold biopsy forceps. Resected and retrieved. - Non-bleeding internal hemorrhoids. - The examination was otherwise normal.  Path - hyperplastic polyps A 5 year surveillance colonoscopy was recommended.   Imaging   Labs:     Latest Ref Rng & Units 02/28/2022   12:00 AM 09/05/2021    8:20 AM 04/24/2021   12:00 AM  CBC  WBC 3.8 - 10.8 Thousand/uL 4.4  5.2  3.8   Hemoglobin 11.7 - 15.5 g/dL 13.9  14.3  14.3   Hematocrit 35.0 - 45.0 % 40.9  44.5  43.4   Platelets 140 - 400 Thousand/uL 253  279  230        Latest Ref Rng & Units 02/28/2022   12:00 AM 04/24/2021   12:00 AM 04/20/2020   12:09 PM  Hepatic Function  Total Protein 6.1 - 8.1 g/dL 7.2  7.1  6.9   AST 10 - 35 U/L '18  19  18   '$ ALT 6 - 29 U/L '17  17  16   '$ Total Bilirubin 0.2 - 1.2 mg/dL 0.6  0.6  0.8  Past Medical History:  Diagnosis Date   Allergy    Arthritis    Colon polyps    Depression    GERD (gastroesophageal reflux disease)    Hypercholesteremia    PONV (postoperative nausea and vomiting)    Postmenopausal    Wears glasses     Past Surgical History:  Procedure Laterality Date   CESAREAN SECTION  2000   COLONOSCOPY     CONVERSION TO TOTAL KNEE Left 01/13/2018   Procedure: Revision of Left knee unicompartmental arthroplasty to total knee arthroplasty;  Surgeon: [name], MD;  Location: [facility];  Service: Orthopedics;  Laterality: Left;   EXCISION MASS HEAD Left 09/18/2021   Procedure: EXCISION MASS LEFT FOREHEAD;  Surgeon: [name], MD;  Location: [facility];  Service: Plastics;  Laterality: Left;   FOOT ARTHRODESIS  1995   right   HYSTERECTOMY ABDOMINAL WITH SALPINGECTOMY  2005    did not remove ovaries   KNEE ARTHROSCOPY WITH MEDIAL MENISECTOMY Left 05/17/2014   Procedure: LEFT ARTHROSCOPY KNEE WITH PARTIAL MEDIAL MENISECTOMY, CHONDROPLASTY OF LATERAL FEMORAL CONDYLE;  Surgeon: Alta Corning, MD;  Location: [facility];  Service: Orthopedics;  Laterality: Left;   KNEE CLOSED REDUCTION Left 07/21/2018   Procedure: CLOSED MANIPULATION KNEE;  Surgeon: [name], MD;  Location: [facility];  Service: Orthopedics;  Laterality: Left;  66mn   PARTIAL KNEE ARTHROPLASTY Left 01/21/2017   Procedure: UNICOMPARTMENTAL LEFT KNEE;  Surgeon: AGaynelle Arabian MD;  Location: [facility];  Service: Orthopedics;  Laterality: Left;   Revision left knee     Dr. AWynelle [id number]   TOTAL ABDOMINAL HYSTERECTOMY     TOTAL KNEE ARTHROPLASTY Right 01/16/2020   Procedure: RIGHT TOTAL KNEE ARTHROPLASTY;  Surgeon: RFrederik Pear MD;  Location: [facility];  Service: Orthopedics;  Laterality: Right;   WISDOM TOOTH EXTRACTION      Current Medications, Allergies, Family History and Social History were reviewed in [employer].     Current Outpatient Medications  Medication Sig Dispense Refill   atorvastatin (LIPITOR) 20 MG tablet TAKE 1 TABLET BY MOUTH AT BEDTIME 90 tablet 3   citalopram (CELEXA) 20 MG tablet Take 1 tablet (20 mg total) by mouth daily. 90 tablet 3   fluticasone (FLONASE) 50 MCG/ACT nasal spray SPRAY 1 SPRAY INTO BOTH NOSTRILS DAILY AS NEEDED FOR ALLERGIES 16 mL 1   MELATONIN PO Take 3 mg by mouth at bedtime as needed (sleep).     meloxicam (MOBIC) 15 MG tablet TAKE 1 TABLET BY MOUTH DAILY AS NEEDED FOR PAIN (Patient taking differently: Take 7.5-15 mg by mouth daily.) 90 tablet 1   Multiple Vitamins-Minerals (ADULT GUMMY PO) Take 2 capsules by mouth daily.     omeprazole (PRILOSEC) 20 MG capsule TAKE 1 CAPSULE BY MOUTH ONCE DAILY BEFORE BREAKFAST 90 capsule 3   rOPINIRole (REQUIP) 0.5 MG tablet TAKE 1 TABLET BY MOUTH EVERY DAY BEFORE BEDTIME FOR  RESTLESS LEG 90 tablet 0   No current facility-administered medications for this visit.    Review of Systems: Recent episode of chest pain ( after a 3rd cup of coffee on empty stomach). No shortness of breath. No urinary complaints.    Physical Exam  Wt Readings from Last 3 Encounters:  02/28/22 152 lb 12.8 oz (69.3 kg)  09/18/21 148 lb (67.1 kg)  09/05/21 (P) 148 lb (67.1 kg)    BP 118/78   Pulse 70   Ht '5\' 6"'$  (1.676 m)   Wt 155 lb (70.3 kg)  SpO2 93%   BMI 25.02 kg/m  Constitutional:  Generally well appearing female in no acute distress. Psychiatric: Pleasant. Normal mood and affect. Behavior is normal. EENT: Pupils normal.  Conjunctivae are normal. No scleral icterus. Neck supple.  Cardiovascular: Normal rate, regular rhythm. No edema Pulmonary/chest: Effort normal and breath sounds normal. No wheezing, rales or rhonchi. Abdominal: Soft, nondistended, nontender. Bowel sounds active throughout. There are no masses palpable. No hepatomegaly. Neurological: Alert and oriented to person place and time. Skin: Skin is warm and dry. No rashes noted.  Tye Savoy, NP  03/24/2022, 8:42 AM

## 2022-03-26 ENCOUNTER — Encounter: Payer: Self-pay | Admitting: Gastroenterology

## 2022-03-26 ENCOUNTER — Ambulatory Visit (AMBULATORY_SURGERY_CENTER): Payer: Medicare HMO | Admitting: Gastroenterology

## 2022-03-26 VITALS — BP 120/73 | HR 57 | Temp 96.6°F | Resp 11

## 2022-03-26 DIAGNOSIS — K317 Polyp of stomach and duodenum: Secondary | ICD-10-CM

## 2022-03-26 DIAGNOSIS — K449 Diaphragmatic hernia without obstruction or gangrene: Secondary | ICD-10-CM

## 2022-03-26 DIAGNOSIS — R140 Abdominal distension (gaseous): Secondary | ICD-10-CM | POA: Diagnosis not present

## 2022-03-26 MED ORDER — RIFAXIMIN 550 MG PO TABS: 550.0000 mg | ORAL_TABLET | Freq: Three times a day (TID) | ORAL | 0 refills | Status: AC

## 2022-03-26 MED ORDER — SODIUM CHLORIDE 0.9 % IV SOLN: 500.0000 mL | Freq: Once | INTRAVENOUS | Status: DC

## 2022-03-26 MED ORDER — RIFAXIMIN 550 MG PO TABS: 550.0000 mg | ORAL_TABLET | Freq: Three times a day (TID) | ORAL | 0 refills | Status: DC

## 2022-03-26 NOTE — Op Note (Addendum)
[place] Patient Name: Joanna Willis Procedure Date: 03/26/2022 2:59 PM MRN: [id number] Endoscopist: Mauri Pole , MD Age: 66 Referring MD:  Date of Birth: 06-Oct-1955 Gender: Female Account #: [id number] Procedure:                Upper GI endoscopy Indications:              Epigastric abdominal pain, Dyspepsia, Abdominal                            distention, Nausea Medicines:                Monitored Anesthesia Care Procedure:                Pre-Anesthesia Assessment:                           - Prior to the procedure, a History and Physical                            was performed, and patient medications and                            allergies were reviewed. The patient's tolerance of                            previous anesthesia was also reviewed. The risks                            and benefits of the procedure and the sedation                            options and risks were discussed with the patient.                            All questions were answered, and informed consent                            was obtained. Prior Anticoagulants: The patient has                            taken no previous anticoagulant or antiplatelet                            agents. ASA Grade Assessment: II - A patient with                            mild systemic disease. After reviewing the risks                            and benefits, the patient was deemed in                            satisfactory condition to undergo the procedure.  After obtaining informed consent, the endoscope was                            passed under direct vision. Throughout the                            procedure, the patient's blood pressure, pulse, and                            oxygen saturations were monitored continuously. The                            GIF [id number] #[id number] was introduced through the                            mouth, and advanced to the second part  of duodenum.                            The upper GI endoscopy was accomplished without                            difficulty. The patient tolerated the procedure                            well. Scope In: Scope Out: Findings:                 The Z-line was regular and was found 38 cm from the                            incisors.                           The lumen of the lower third of the esophagus was                            mildly dilated.                           A 2 cm hiatal hernia was present.                           Multiple 10 to 30 mm pedunculated and sessile                            fundic gland polyps with no bleeding and no                            stigmata of recent bleeding were found in the                            entire examined stomach. Biopsies were taken with a                            cold forceps for  histology.                           The cardia and gastric fundus were normal on                            retroflexion.                           The examined duodenum was normal. Complications:            No immediate complications. Estimated Blood Loss:     Estimated blood loss was minimal. Impression:               - Z-line regular, 38 cm from the incisors.                           - Dilation in the lower third of the esophagus.                           - 2 cm hiatal hernia.                           - Multiple fundic gland polyps. Biopsied.                           - Normal examined duodenum. Recommendation:           - Resume previous diet.                           - Continue present medications.                           - Await pathology results.                           - Follow an antireflux regimen.                           - Xifaxan '550mg'$  TID X 10 days for SIBO and                            IBS-diarrhea                           - Follow up in office visit next available                            appointment in 1-2 months with  me or APP Mauri Pole, MD 03/26/2022 3:24:58 PM This report has been signed electronically.

## 2022-03-26 NOTE — Patient Instructions (Signed)
YOU HAD AN ENDOSCOPIC PROCEDURE TODAY AT THE [facility]:   Refer to the procedure report that was given to you for any specific questions about what was found during the examination.  If the procedure report does not answer your questions, please call your gastroenterologist to clarify.  If you requested that your care partner not be given the details of your procedure findings, then the procedure report has been included in a sealed envelope for you to review at your convenience later.  YOU SHOULD EXPECT: Some feelings of bloating in the abdomen. Passage of more gas than usual.  Walking can help get rid of the air that was put into your GI tract during the procedure and reduce the bloating. If you had a lower endoscopy (such as a colonoscopy or flexible sigmoidoscopy) you may notice spotting of blood in your stool or on the toilet paper. If you underwent a bowel prep for your procedure, you may not have a normal bowel movement for a few days.  Please Note:  You might notice some irritation and congestion in your nose or some drainage.  This is from the oxygen used during your procedure.  There is no need for concern and it should clear up in a day or so.  SYMPTOMS TO REPORT IMMEDIATELY:   Following upper endoscopy (EGD)  Vomiting of blood or coffee ground material  New chest pain or pain under the shoulder blades  Painful or persistently difficult swallowing  New shortness of breath  Fever of 100F or higher  Black, tarry-looking stools  For urgent or emergent issues, a gastroenterologist can be reached at any hour by calling [phone]. Do not use MyChart messaging for urgent concerns.    DIET:  We do recommend a small meal at first, but then you may proceed to your regular diet.  Drink plenty of fluids but you should avoid alcoholic beverages for 24 hours.  ACTIVITY:  You should plan to take it easy for the rest of today and you should NOT DRIVE or use heavy machinery  until tomorrow (because of the sedation medicines used during the test).    FOLLOW UP: Our staff will call the number listed on your records the next business day following your procedure.  We will call around 7:15- 8:00 am to check on you and address any questions or concerns that you may have regarding the information given to you following your procedure. If we do not reach you, we will leave a message.     If any biopsies were taken you will be contacted by phone or by letter within the next 1-3 weeks.  Please call us at [phone] if you have not heard about the biopsies in 3 weeks.    SIGNATURES/CONFIDENTIALITY: You and/or your care partner have signed paperwork which will be entered into your electronic medical record.  These signatures attest to the fact that that the information above on your After Visit Summary has been reviewed and is understood.  Full responsibility of the confidentiality of this discharge information lies with you and/or your care-partner. 

## 2022-03-26 NOTE — Progress Notes (Signed)
Pt's states no medical or surgical changes since previsit or office visit. 

## 2022-03-26 NOTE — Progress Notes (Signed)
Called to room to assist during endoscopic procedure.  Patient ID and intended procedure confirmed with present staff. Received instructions for my participation in the procedure from the performing physician.  

## 2022-03-26 NOTE — Progress Notes (Signed)
.  Please refer to office visit note 03/24/22. No additional changes in H&P Patient is appropriate for planned procedure(s) and anesthesia in an ambulatory setting  K. Denzil Magnuson , MD [phone]

## 2022-03-26 NOTE — Progress Notes (Signed)
Good respiratory effort noted through out the procedure. ETCO2 with poor waveform during endoscopy. VSS

## 2022-03-26 NOTE — Progress Notes (Signed)
Pt resting comfortably. VSS. Airway intact. SBAR complete to RN. All questions answered.   

## 2022-03-27 ENCOUNTER — Telehealth: Payer: Self-pay | Admitting: Gastroenterology

## 2022-03-27 ENCOUNTER — Telehealth: Payer: Self-pay | Admitting: *Deleted

## 2022-03-27 ENCOUNTER — Telehealth: Payer: Self-pay | Admitting: Pharmacy Technician

## 2022-03-27 ENCOUNTER — Encounter: Payer: Self-pay | Admitting: Gastroenterology

## 2022-03-27 ENCOUNTER — Other Ambulatory Visit (HOSPITAL_COMMUNITY): Payer: Self-pay

## 2022-03-27 NOTE — Telephone Encounter (Signed)
Patient Advocate Encounter  Prior Authorization for Doreene Nest has been approved.    PA# [id number] Effective dates: 10.5.23 through 10.19.23  Rayburn Mundis B. CPhT P: [phone] F: [phone]     Received notification that prior authorization for Doreene Nest is required.   PA submitted on 10.5.23 Key [id number] Status is pending    Luciano Cutter, CPhT Patient Advocate Phone: [phone]

## 2022-03-27 NOTE — Telephone Encounter (Signed)
Inbound call from patient stating that she had a EGD yesterday with Dr. Silverio Decamp. Patient stated that she needed to make an appointment for a follow up to go over test results. I advised her that Dr. Silverio Decamp was booked and that we could schedule with [facility]. I advised patient that Nevin Bloodgood had an opening on 11/9. Patient stated that she could not wait that long to go over her test results.   Patient also stated that her insurance  need approval from Dr. Silverio Decamp for  Doreene Nest.  Patient is requesting a call back to discuss how she can get a sooner appointment. Please advise.

## 2022-03-27 NOTE — Telephone Encounter (Signed)
Returned call to patient. I informed patient that we will contact her with results prior to her follow up appt. I told her that it could take 1-2 weeks before we receive her pathology results but we will contact her. I told her that Dr. Silverio Decamp still wanted her to have a follow up appt. We scheduled pt for a follow up appt with Tye Savoy, NP on Thursday, 05/01/22 at 10:30 am. Pt also stated that Xifaxan requires a PA. I told pt that we will send request to PA team and they will work on this for her. I told her that her pharmacy will be contacted once we have a response from her insurance company. Pt verbalized understanding of all information and had no concerns at the end of the call.  Stephaine, will you please initiate PA for Xifaxan. Thank you

## 2022-03-27 NOTE — Telephone Encounter (Signed)
PA has been submitted and approved. Please telephone encounter to see additional notes regarding high copay.

## 2022-03-27 NOTE — Telephone Encounter (Signed)
  Follow up Call-     03/26/2022    2:27 PM 10/24/2019   10:38 AM  Call back number  Post procedure Call Back phone  # [phone]  Permission to leave phone message Yes Yes     Patient questions:  Do you have a fever, pain , or abdominal swelling? No. Pain Score  0 *  Have you tolerated food without any problems? Yes.    Have you been able to return to your normal activities? Yes.    Do you have any questions about your discharge instructions: Diet   No. Medications  No. Follow up visit  No.  Do you have questions or concerns about your Care? No.  Actions: * If pain score is 4 or above: No action needed, pain <4.

## 2022-03-28 NOTE — Telephone Encounter (Signed)
Patient notified. She has purchased the medication.

## 2022-04-03 ENCOUNTER — Encounter: Payer: Self-pay | Admitting: Gastroenterology

## 2022-04-08 DIAGNOSIS — Z79899 Other long term (current) drug therapy: Secondary | ICD-10-CM | POA: Diagnosis not present

## 2022-04-08 DIAGNOSIS — J309 Allergic rhinitis, unspecified: Secondary | ICD-10-CM | POA: Diagnosis not present

## 2022-04-08 DIAGNOSIS — Z880 Allergy status to penicillin: Secondary | ICD-10-CM | POA: Diagnosis not present

## 2022-04-08 DIAGNOSIS — K219 Gastro-esophageal reflux disease without esophagitis: Secondary | ICD-10-CM | POA: Diagnosis not present

## 2022-04-08 DIAGNOSIS — R69 Illness, unspecified: Secondary | ICD-10-CM | POA: Diagnosis not present

## 2022-04-08 DIAGNOSIS — E785 Hyperlipidemia, unspecified: Secondary | ICD-10-CM | POA: Diagnosis not present

## 2022-04-08 DIAGNOSIS — M1990 Unspecified osteoarthritis, unspecified site: Secondary | ICD-10-CM | POA: Diagnosis not present

## 2022-04-11 ENCOUNTER — Ambulatory Visit (INDEPENDENT_AMBULATORY_CARE_PROVIDER_SITE_OTHER): Payer: Medicare HMO

## 2022-04-11 ENCOUNTER — Ambulatory Visit: Payer: Medicare HMO | Admitting: Podiatry

## 2022-04-11 DIAGNOSIS — M722 Plantar fascial fibromatosis: Secondary | ICD-10-CM | POA: Diagnosis not present

## 2022-04-11 DIAGNOSIS — M778 Other enthesopathies, not elsewhere classified: Secondary | ICD-10-CM

## 2022-04-11 NOTE — Progress Notes (Signed)
  Subjective:  Patient ID: Joanna Willis, female    DOB: January 29, 1956,  MRN: [id number]  Chief Complaint  Patient presents with   Foot Pain    Pain to right arch. Pain radiates to ankle. Patient is taking meloxicam daily the last few days. Patient is icing.    66 y.o. female presents with the above complaint.  Patient presents with pain to the right arch.  She says that she has pain in the right arch near the heel.  She is taking meloxicam and icing.  This is recently worsened she had a history of planter fasciitis that initially improved but is now worsened.  She is having trouble doing her physical activities.  She does wear some over-the-counter inserts that she bought not sure if they are helping now.   Review of Systems: Negative except as noted in the HPI. Denies N/V/F/Ch.   Objective:  There were no vitals filed for this visit. There is no height or weight on file to calculate BMI. Constitutional Well developed. Well nourished.  Vascular Dorsalis pedis pulses palpable bilaterally. Posterior tibial pulses palpable bilaterally. Capillary refill normal to all digits.  No cyanosis or clubbing noted. Pedal hair growth normal.  Neurologic Normal speech. Oriented to person, place, and time. Epicritic sensation to light touch grossly present bilaterally.  Dermatologic Nails well groomed and normal in appearance. No open wounds. No skin lesions.  Orthopedic: Normal joint ROM without pain or crepitus bilaterally. No visible deformities. Tender to palpation at the calcaneal tuber right. No pain with calcaneal squeeze right. Ankle ROM diminished range of motion right. Silfverskiold Test: negative right.   Radiographs: Taken and reviewed. No acute fractures or dislocations. No evidence of stress fracture.  Plantar heel spur absent. Posterior heel spur absent.   Assessment:   1. Plantar fasciitis, right   2. Capsulitis of foot, right    Plan:  Patient was evaluated and treated  and all questions answered.  Plantar Fasciitis, right - XR reviewed as above.  - Educated on icing and stretching. Instructions given.  - Injection delivered to the plantar fascia as below. - DME: PF brace dispensed - Pharmacologic management: Meloxicam. Educated on risks/benefits and proper taking of medication.  Procedure: Injection Tendon/Ligament Location: Right plantar fascia at the glabrous junction; medial approach. Skin Prep: alcohol Injectate: 1 cc 0.5% marcaine plain, 1 cc kenalog 10. Disposition: Patient tolerated procedure well. Injection site dressed with a band-aid.  Return in about 4 weeks (around 05/09/2022) for plantar fasiciitis right foot.

## 2022-04-11 NOTE — Patient Instructions (Signed)

## 2022-04-21 DIAGNOSIS — H5203 Hypermetropia, bilateral: Secondary | ICD-10-CM | POA: Diagnosis not present

## 2022-04-25 ENCOUNTER — Ambulatory Visit (INDEPENDENT_AMBULATORY_CARE_PROVIDER_SITE_OTHER): Payer: Medicare HMO | Admitting: Medical-Surgical

## 2022-04-25 ENCOUNTER — Encounter: Payer: Self-pay | Admitting: Medical-Surgical

## 2022-04-25 VITALS — BP 118/73 | HR 57 | Resp 20 | Ht 66.0 in | Wt 151.4 lb

## 2022-04-25 DIAGNOSIS — E782 Mixed hyperlipidemia: Secondary | ICD-10-CM

## 2022-04-25 DIAGNOSIS — R69 Illness, unspecified: Secondary | ICD-10-CM | POA: Diagnosis not present

## 2022-04-25 DIAGNOSIS — Z0000 Encounter for general adult medical examination without abnormal findings: Secondary | ICD-10-CM | POA: Diagnosis not present

## 2022-04-25 DIAGNOSIS — Z780 Asymptomatic menopausal state: Secondary | ICD-10-CM | POA: Diagnosis not present

## 2022-04-25 DIAGNOSIS — Z23 Encounter for immunization: Secondary | ICD-10-CM

## 2022-04-25 DIAGNOSIS — F4323 Adjustment disorder with mixed anxiety and depressed mood: Secondary | ICD-10-CM

## 2022-04-25 DIAGNOSIS — G5603 Carpal tunnel syndrome, bilateral upper limbs: Secondary | ICD-10-CM

## 2022-04-25 DIAGNOSIS — K219 Gastro-esophageal reflux disease without esophagitis: Secondary | ICD-10-CM | POA: Diagnosis not present

## 2022-04-25 DIAGNOSIS — G2581 Restless legs syndrome: Secondary | ICD-10-CM | POA: Diagnosis not present

## 2022-04-25 MED ORDER — ATORVASTATIN CALCIUM 40 MG PO TABS: 40.0000 mg | ORAL_TABLET | Freq: Every day | ORAL | 3 refills | Status: DC

## 2022-04-25 MED ORDER — CITALOPRAM HYDROBROMIDE 20 MG PO TABS: 10.0000 mg | ORAL_TABLET | Freq: Every day | ORAL | 3 refills | Status: DC

## 2022-04-25 MED ORDER — TRIAMCINOLONE ACETONIDE 0.1 % MT PSTE: PASTE | OROMUCOSAL | 0 refills | Status: DC

## 2022-04-25 MED ORDER — MELOXICAM 15 MG PO TABS: 7.5000 mg | ORAL_TABLET | Freq: Every day | ORAL | 1 refills | Status: DC

## 2022-04-25 MED ORDER — ROPINIROLE HCL 0.5 MG PO TABS: ORAL_TABLET | ORAL | 0 refills | Status: AC

## 2022-04-25 MED ORDER — OMEPRAZOLE 20 MG PO CPDR: DELAYED_RELEASE_CAPSULE | ORAL | 3 refills | Status: DC

## 2022-04-25 MED ORDER — FLUTICASONE PROPIONATE 50 MCG/ACT NA SUSP: NASAL | 1 refills | Status: DC

## 2022-04-25 NOTE — Progress Notes (Signed)
Complete physical exam  Patient: Joanna Willis   DOB: 1956/05/12   66 y.o. Female  MRN: [id number]  Subjective:    Chief Complaint  Patient presents with   Annual Exam    Joanna Willis is a 66 y.o. female who presents today for a complete physical exam. She reports consuming a  low FODMAP, lactose free  diet.  Uses an elliptigo machine at home  She generally feels well. She reports sleeping well. She does not have additional problems to discuss today.    Most recent fall risk assessment:    04/25/2022   10:24 AM  [place] in the past year? 0  Number falls in past yr: 0  Injury with Fall? 0  Follow up Falls evaluation completed     Most recent depression screenings:    04/25/2022   10:23 AM 02/28/2022    9:50 AM  PHQ 2/9 Scores  PHQ - 2 Score 0 0    Vision:Within last year and Dental: No current dental problems and Receives regular dental care    Patient Care Team: Samuel Bouche, NP as PCP - General (Nurse Practitioner)   Outpatient Medications Prior to Visit  Medication Sig   MELATONIN PO Take 3 mg by mouth at bedtime as needed (sleep).   Multiple Vitamins-Minerals (ADULT GUMMY PO) Take 2 capsules by mouth daily.   [DISCONTINUED] atorvastatin (LIPITOR) 40 MG tablet Take 40 mg by mouth daily.   [DISCONTINUED] citalopram (CELEXA) 20 MG tablet Take 1 tablet (20 mg total) by mouth daily.   [DISCONTINUED] fluticasone (FLONASE) 50 MCG/ACT nasal spray SPRAY 1 SPRAY INTO BOTH NOSTRILS DAILY AS NEEDED FOR ALLERGIES   [DISCONTINUED] rOPINIRole (REQUIP) 0.5 MG tablet TAKE 1 TABLET BY MOUTH EVERY DAY BEFORE BEDTIME FOR RESTLESS LEG   [DISCONTINUED] triamcinolone (KENALOG) 0.1 % paste SMARTSIG:sparingly TO TEETH 4 Times Daily   [DISCONTINUED] atorvastatin (LIPITOR) 20 MG tablet TAKE 1 TABLET BY MOUTH AT BEDTIME   [DISCONTINUED] meloxicam (MOBIC) 15 MG tablet TAKE 1 TABLET BY MOUTH DAILY AS NEEDED FOR PAIN (Patient taking differently: Take 7.5-15 mg by mouth daily.)    [DISCONTINUED] omeprazole (PRILOSEC) 20 MG capsule TAKE 1 CAPSULE BY MOUTH ONCE DAILY BEFORE BREAKFAST   No facility-administered medications prior to visit.   Review of Systems  Constitutional:  Negative for chills, fever, malaise/fatigue and weight loss.  HENT:  Negative for congestion, ear pain, hearing loss, sinus pain and sore throat.   Eyes:  Negative for blurred vision, photophobia and pain.  Respiratory:  Negative for cough, shortness of breath and wheezing.   Cardiovascular:  Negative for chest pain, palpitations and leg swelling.  Gastrointestinal:  Positive for heartburn. Negative for abdominal pain, constipation, diarrhea, nausea and vomiting.       Bloating intermittently  Genitourinary:  Negative for dysuria, frequency and urgency.  Musculoskeletal:  Negative for falls and neck pain.  Skin:  Negative for itching and rash.  Neurological:  Negative for dizziness, weakness and headaches.  Endo/Heme/Allergies:  Negative for polydipsia. Does not bruise/bleed easily.  Psychiatric/Behavioral:  Negative for depression, substance abuse and suicidal ideas. The patient is not nervous/anxious.      Objective:    BP 118/73 (BP Location: Left Arm, Cuff Size: Normal)   Pulse (!) 57   Resp 20   Ht '5\' 6"'$  (1.676 m)   Wt 151 lb 6.4 oz (68.7 kg)   SpO2 100%   BMI 24.44 kg/m    Physical Exam Constitutional:  General: She is not in acute distress.    Appearance: Normal appearance. She is not ill-appearing.  HENT:     Head: Normocephalic and atraumatic.     Right Ear: Tympanic membrane, ear canal and external ear normal. There is no impacted cerumen.     Left Ear: Tympanic membrane, ear canal and external ear normal. There is no impacted cerumen.     Nose: Nose normal.     Mouth/Throat:     Mouth: Mucous membranes are moist.     Pharynx: No oropharyngeal exudate or posterior oropharyngeal erythema.  Eyes:     General: No scleral icterus.       Right eye: No discharge.         Left eye: No discharge.     Extraocular Movements: Extraocular movements intact.     Conjunctiva/sclera: Conjunctivae normal.     Pupils: Pupils are equal, round, and reactive to light.  Neck:     Thyroid: No thyromegaly.     Vascular: No carotid bruit or JVD.     Trachea: Trachea normal.  Cardiovascular:     Rate and Rhythm: Normal rate and regular rhythm.     Pulses: Normal pulses.     Heart sounds: Normal heart sounds. No murmur heard.    No friction rub. No gallop.  Pulmonary:     Effort: Pulmonary effort is normal. No respiratory distress.     Breath sounds: Normal breath sounds. No wheezing.  Abdominal:     General: Bowel sounds are normal. There is no distension.     Palpations: Abdomen is soft. There is no mass.     Tenderness: There is abdominal tenderness. There is no guarding or rebound.     Hernia: No hernia is present.  Musculoskeletal:        General: Normal range of motion.     Cervical back: Normal range of motion and neck supple.  Lymphadenopathy:     Cervical: No cervical adenopathy.  Skin:    General: Skin is warm and dry.  Neurological:     Mental Status: She is alert and oriented to person, place, and time.     Cranial Nerves: No cranial nerve deficit.  Psychiatric:        Mood and Affect: Mood normal.        Behavior: Behavior normal.        Thought Content: Thought content normal.        Judgment: Judgment normal.   No results found for any visits on 04/25/22.     Assessment & Plan:    Routine Health Maintenance and Physical Exam  Immunization History  Administered Date(s) Administered   Fluad Quad(high Dose 65+) 04/25/2022   Influenza Inj Mdck Quad Pf 04/06/2020   Influenza,inj,quad, With Preservative 03/23/2017   Influenza-Unspecified 03/23/2017, 03/28/2019, 04/22/2021   Moderna Covid-19 Vaccine Bivalent Booster 4yr & up 04/19/2021   PFIZER(Purple Top)SARS-COV-2 Vaccination 06/25/2019, 07/16/2019   Pneumococcal Conjugate-13 04/25/2022    Tdap 06/23/2010   Zoster Recombinat (Shingrix) 10/26/2017, 06/23/2018    Health Maintenance  Topic Date Due   Medicare Annual Wellness (AWV)  Never done   DEXA SCAN  Never done   COVID-19 Vaccine (4 - Pfizer series) 05/11/2022 (Originally 08/20/2021)   Pneumonia Vaccine 65 Years old (2 - PPSV23 or PCV20) 04/26/2023   MAMMOGRAM  11/26/2023   COLONOSCOPY (Pts 45-462yrInsurance coverage will need to be confirmed)  10/23/2024   INFLUENZA VACCINE  Completed   Zoster Vaccines- Shingrix  Completed  HPV VACCINES  Aged Out   Hepatitis C Screening  Discontinued   HIV Screening  Discontinued    Discussed health benefits of physical activity, and encouraged her to engage in regular exercise appropriate for her age and condition.  1. Annual physical exam Reviewed recent labs.  No need to redraw today.  Up-to-date on preventative care.  Wellness information provided with AVS.  2. Need for influenza vaccination Flu vaccine given in office. - Flu Vaccine QUAD High Dose(Fluad)  3. Gastroesophageal reflux disease, unspecified whether esophagitis present Continue omeprazole 20 mg daily.  Refill sent. - omeprazole (PRILOSEC) 20 MG capsule; TAKE 1 CAPSULE BY MOUTH ONCE DAILY BEFORE BREAKFAST  Dispense: 90 capsule; Refill: 3  4. Restless leg syndrome Continue ropinirole 0.5 mg at bedtime.  Symptoms well managed. - rOPINIRole (REQUIP) 0.5 MG tablet; TAKE 1 TABLET BY MOUTH EVERY DAY BEFORE BEDTIME FOR RESTLESS LEG  Dispense: 90 tablet; Refill: 0  5. Mixed hyperlipidemia Recent lipid panel looked good.  Continue atorvastatin 40 mg daily. - atorvastatin (LIPITOR) 40 MG tablet; Take 1 tablet (40 mg total) by mouth daily.  Dispense: 90 tablet; Refill: 3  6. Adjustment disorder with mixed anxiety and depressed mood Continue Celexa 10 mg daily.  If increased stress, okay to increase to 20 mg daily as needed. - citalopram (CELEXA) 20 MG tablet; Take 0.5-1 tablets (10-20 mg total) by mouth daily.   Dispense: 90 tablet; Refill: 3  7. Bilateral carpal tunnel syndrome Recommend evaluation by Dr. Darene Lamer.  She has been taking anti-inflammatories for greater than 6 weeks and night bracing but has not had any improvement in her symptoms.  Right wrist affected more than the left however both do cause problems if she does not brace every night.  8. Postmenopausal DEXA scan ordered. - DG Bone Density; Future  9. Need for pneumococcal vaccination Pneumonia vaccine given in office today. - Pneumococcal conjugate vaccine 13-valent  Return in about 1 year (around 04/26/2023) for annual physical exam.   Samuel Bouche, NP

## 2022-05-01 ENCOUNTER — Ambulatory Visit: Payer: Medicare HMO | Admitting: Nurse Practitioner

## 2022-05-01 ENCOUNTER — Encounter: Payer: Self-pay | Admitting: Nurse Practitioner

## 2022-05-01 VITALS — BP 106/68 | HR 66 | Ht 66.0 in | Wt 152.2 lb

## 2022-05-01 DIAGNOSIS — R140 Abdominal distension (gaseous): Secondary | ICD-10-CM | POA: Diagnosis not present

## 2022-05-01 NOTE — Progress Notes (Signed)
Chief Complaint:  bloating   Assessment &  Plan   #66 year old female with bloating. Symptoms improved but treating empirically for SIBO with Xifaxan. Small bowel biopsies negative for celiac.  She has been experimenting with a low FODMAP diet and will continue to try and identify culprit foods. So far, she has determined that lactose may be an issue for her  Can try Gas-X  Follow up as needed   # GERD, PPI dependent. Recently had symptoms just in the short time she was off PPI for H.pylori breath testing   HPI   Joanna Willis is here for follow up after EGD. She was seen on 10 /2 for evaluation of bloating, nausea and malaise .  For evaluation she had an EGD on 03/26/2022 with findings of multiple fundic gland polyps, a 2 cm hiatal hernia, some dilation of her lower esophagus .  Small bowel biopsies negative for celiac disease.  Previous H. pylori breath test was negative .  Following the EGD she was given a trial of Xifaxan for empirical treatment of SIBO   Interval History:  She does feel better after Xifaxan. Only about three episodes of bloating since completion of treatment. She does think lactose is problematic. She doesn't drink through straws or chew gum. She is experimenting with low FODMAP diet  She has no energy. Otherwise doing okay. Weight is stable. Taking miralax in coffee daily and bowels moving regularly   Previous GI Evaluation   EGD 03/26/22 - Z-line regular, 38 cm from the incisors. - Dilation in the lower third of the esophagus was seen. - 2 cm hiatal hernia. - Multiple fundic gland polyps. Biopsied. - Normal examined duodenum.  Diagnosis 1. Surgical [P], duodenal bulb, 2nd portion of duodenum and distal duodenum - DUODENAL MUCOSA WITH NO SPECIFIC HISTOPATHOLOGIC CHANGES - NEGATIVE FOR INCREASED INTRAEPITHELIAL LYMPHOCYTES OR VILLOUS ARCHITECTURAL CHANGES 2. Surgical [P], gastric polyps - FUNDIC GLAND POLYP(S) - NEGATIVE FOR DYSPLASIA  H.pylori breath test  negative TSH normal.   Labs:     Latest Ref Rng & Units 02/28/2022   12:00 AM 09/05/2021    8:20 AM 04/24/2021   12:00 AM  CBC  WBC 3.8 - 10.8 Thousand/uL 4.4  5.2  3.8   Hemoglobin 11.7 - 15.5 g/dL 13.9  14.3  14.3   Hematocrit 35.0 - 45.0 % 40.9  44.5  43.4   Platelets 140 - 400 Thousand/uL 253  279  230        Latest Ref Rng & Units 02/28/2022   12:00 AM 04/24/2021   12:00 AM 04/20/2020   12:09 PM  Hepatic Function  Total Protein 6.1 - 8.1 g/dL 7.2  7.1  6.9   AST 10 - 35 U/L '18  19  18   '$ ALT 6 - 29 U/L '17  17  16   '$ Total Bilirubin 0.2 - 1.2 mg/dL 0.6  0.6  0.8      Past Medical History:  Diagnosis Date   Allergy    Arthritis    Colon polyps    Depression    GERD (gastroesophageal reflux disease)    Hypercholesteremia    PONV (postoperative nausea and vomiting)    Postmenopausal    Wears glasses     Past Surgical History:  Procedure Laterality Date   CESAREAN SECTION  2000   COLONOSCOPY     CONVERSION TO TOTAL KNEE Left 01/13/2018   Procedure: Revision of Left knee unicompartmental arthroplasty to total knee arthroplasty;  Surgeon: [name],  MD;  Location: [facility];  Service: Orthopedics;  Laterality: Left;   EXCISION MASS HEAD Left 09/18/2021   Procedure: EXCISION MASS LEFT FOREHEAD;  Surgeon: [name], MD;  Location: [facility];  Service: Plastics;  Laterality: Left;   FOOT ARTHRODESIS  1995   right   HYSTERECTOMY ABDOMINAL WITH SALPINGECTOMY  2005   did not remove ovaries   KNEE ARTHROSCOPY WITH MEDIAL MENISECTOMY Left 05/17/2014   Procedure: LEFT ARTHROSCOPY KNEE WITH PARTIAL MEDIAL MENISECTOMY, CHONDROPLASTY OF LATERAL FEMORAL CONDYLE;  Surgeon: Alta Corning, MD;  Location: [facility];  Service: Orthopedics;  Laterality: Left;   KNEE CLOSED REDUCTION Left 07/21/2018   Procedure: CLOSED MANIPULATION KNEE;  Surgeon: [name], MD;  Location: [facility];  Service: Orthopedics;  Laterality: Left;  38mn   PARTIAL KNEE ARTHROPLASTY Left  01/21/2017   Procedure: UNICOMPARTMENTAL LEFT KNEE;  Surgeon: AGaynelle Arabian MD;  Location: [facility];  Service: Orthopedics;  Laterality: Left;   Revision left knee     Dr. AWynelle [id number]   TOTAL ABDOMINAL HYSTERECTOMY     TOTAL KNEE ARTHROPLASTY Right 01/16/2020   Procedure: RIGHT TOTAL KNEE ARTHROPLASTY;  Surgeon: RFrederik Pear MD;  Location: [facility];  Service: Orthopedics;  Laterality: Right;   WISDOM TOOTH EXTRACTION      Current Medications, Allergies, Family History and Social History were reviewed in [employer].     Current Outpatient Medications  Medication Sig Dispense Refill   atorvastatin (LIPITOR) 40 MG tablet Take 1 tablet (40 mg total) by mouth daily. 90 tablet 3   citalopram (CELEXA) 20 MG tablet Take 0.5-1 tablets (10-20 mg total) by mouth daily. 90 tablet 3   fluticasone (FLONASE) 50 MCG/ACT nasal spray SPRAY 1 SPRAY INTO BOTH NOSTRILS DAILY AS NEEDED FOR ALLERGIES 16 mL 1   MELATONIN PO Take 3 mg by mouth at bedtime as needed (sleep).     meloxicam (MOBIC) 15 MG tablet Take 0.5-1 tablets (7.5-15 mg total) by mouth daily. 90 tablet 1   Multiple Vitamins-Minerals (ADULT GUMMY PO) Take 2 capsules by mouth daily.     omeprazole (PRILOSEC) 20 MG capsule TAKE 1 CAPSULE BY MOUTH ONCE DAILY BEFORE BREAKFAST 90 capsule 3   rOPINIRole (REQUIP) 0.5 MG tablet TAKE 1 TABLET BY MOUTH EVERY DAY BEFORE BEDTIME FOR RESTLESS LEG 90 tablet 0   triamcinolone (KENALOG) 0.1 % paste SMARTSIG:sparingly TO TEETH 4 Times Daily 5 g 0   No current facility-administered medications for this visit.    Review of Systems: No chest pain. No shortness of breath. No urinary complaints.    Physical Exam  Wt Readings from Last 3 Encounters:  05/01/22 152 lb 4 oz (69.1 kg)  04/25/22 151 lb 6.4 oz (68.7 kg)  03/24/22 155 lb (70.3 kg)    BP 106/68   Pulse 66   Ht '5\' 6"'$  (1.676 m)   Wt 152 lb 4 oz (69.1 kg)   BMI 24.57 kg/m  Constitutional:  Generally well  appearing female in no acute distress. Psychiatric: Pleasant. Normal mood and affect. Behavior is normal. EENT: Pupils normal.  Conjunctivae are normal. No scleral icterus. Neck supple.  Cardiovascular: Normal rate, regular rhythm. No edema Pulmonary/chest: Effort normal and breath sounds normal. No wheezing, rales or rhonchi. Abdominal: Soft, nondistended, nontender. Bowel sounds active throughout. There are no masses palpable. No hepatomegaly. Neurological: Alert and oriented to person place and time. Skin: Skin is warm and dry. No rashes noted.  PTye Savoy NP  05/01/2022, 11:03 AM

## 2022-05-01 NOTE — Patient Instructions (Signed)
Continue Gas -X as needed.   Follow up as needed.   _______________________________________________________  If you are age 65 or older, your body mass index should be between 23-30. Your Body mass index is 24.57 kg/m. If this is out of the aforementioned range listed, please consider follow up with your Primary Care Provider.  If you are age 82 or younger, your body mass index should be between 19-25. Your Body mass index is 24.57 kg/m. If this is out of the aformentioned range listed, please consider follow up with your Primary Care Provider.   ________________________________________________________  The [facility] providers would like to encourage you to use [facility] to communicate with providers for non-urgent requests or questions.  Due to long hold times on the telephone, sending your provider a message by [facility] may be a faster and more efficient way to get a response.  Please allow 48 business hours for a response.  Please remember that this is for non-urgent requests.  _______________________________________________________  Thank you for choosing me and [facility].

## 2022-05-07 ENCOUNTER — Ambulatory Visit (INDEPENDENT_AMBULATORY_CARE_PROVIDER_SITE_OTHER): Payer: Medicare HMO

## 2022-05-07 DIAGNOSIS — Z780 Asymptomatic menopausal state: Secondary | ICD-10-CM | POA: Diagnosis not present

## 2022-05-07 DIAGNOSIS — Z13820 Encounter for screening for osteoporosis: Secondary | ICD-10-CM

## 2022-05-09 ENCOUNTER — Ambulatory Visit: Payer: Medicare HMO | Admitting: Podiatry

## 2022-05-12 ENCOUNTER — Ambulatory Visit (INDEPENDENT_AMBULATORY_CARE_PROVIDER_SITE_OTHER): Payer: Medicare HMO | Admitting: Podiatry

## 2022-05-12 ENCOUNTER — Ambulatory Visit (INDEPENDENT_AMBULATORY_CARE_PROVIDER_SITE_OTHER): Payer: Medicare HMO | Admitting: Sports Medicine

## 2022-05-12 VITALS — Wt 153.0 lb

## 2022-05-12 DIAGNOSIS — G5603 Carpal tunnel syndrome, bilateral upper limbs: Secondary | ICD-10-CM | POA: Diagnosis not present

## 2022-05-12 DIAGNOSIS — M722 Plantar fascial fibromatosis: Secondary | ICD-10-CM | POA: Diagnosis not present

## 2022-05-12 MED ORDER — GABAPENTIN 300 MG PO CAPS: 300.0000 mg | ORAL_CAPSULE | Freq: Every day | ORAL | 3 refills | Status: DC

## 2022-05-12 NOTE — Assessment & Plan Note (Signed)
This is a pleasant 66 year old female, she said years of numbness tingling both hands, thumb through ring finger, worse at night. She got some improvement with nighttime splinting but this has stopped working. Exam is for the most part normal, negative Tinel's and Phalen signs. Adding Neurontin at night, home physical therapy, continue nighttime splinting, return to see me in 6 weeks, hydrodissection if not better.

## 2022-05-12 NOTE — Progress Notes (Signed)
    Procedures performed today:    None.  Independent interpretation of notes and tests performed by another provider:   None.  Brief History, Exam, Impression, and Recommendations:    Carpal tunnel syndrome, bilateral This is a pleasant 66 year old female, she said years of numbness tingling both hands, thumb through ring finger, worse at night. She got some improvement with nighttime splinting but this has stopped working. Exam is for the most part normal, negative Tinel's and Phalen signs. Adding Neurontin at night, home physical therapy, continue nighttime splinting, return to see me in 6 weeks, hydrodissection if not better.  Chronic process with exacerbation and pharmacologic intervention  ____________________________________________ Gwen Her. Dianah Field, M.D., ABFM., CAQSM., AME. Primary Care and Sports Medicine [facility]  Adjunct Professor of [employer] [facility] of [employer]  Risk manager

## 2022-05-12 NOTE — Progress Notes (Signed)
  Subjective:  Patient ID: Joanna Willis, female    DOB: Apr 27, 1956,  MRN: [id number]  Chief Complaint  Patient presents with   Follow-up    Follow up plantar fasciitis. Patient is taking meloxicam as ordered.    Foot Orthotics    Interested in custom orthotics    66 y.o. female presents for follow up to plantar fasciitis right heel.  She had a steroid shot last visit.  She is doing very well at this time having only a slight amount of pain from time to time but not bothering her nearly as much as it was.  She does not feel she needs another steroid injection at this time.  She has been doing the icing and stretching as recommended.  She has been using a plantar fascial brace as previously dispensed and recommended.   Review of Systems: Negative except as noted in the HPI. Denies N/V/F/Ch.   Objective:  There were no vitals filed for this visit. There is no height or weight on file to calculate BMI. Constitutional Well developed. Well nourished.  Vascular Dorsalis pedis pulses palpable bilaterally. Posterior tibial pulses palpable bilaterally. Capillary refill normal to all digits.  No cyanosis or clubbing noted. Pedal hair growth normal.  Neurologic Normal speech. Oriented to person, place, and time. Epicritic sensation to light touch grossly present bilaterally.  Dermatologic Nails well groomed and normal in appearance. No open wounds. No skin lesions.  Orthopedic: Normal joint ROM without pain or crepitus bilaterally. No visible deformities. Minimally to non tender to palpation at the calcaneal tuber right. No pain with calcaneal squeeze right. Ankle ROM diminished range of motion right. Silfverskiold Test: negative right.   Radiographs: Taken and reviewed. No acute fractures or dislocations. No evidence of stress fracture.  Plantar heel spur absent. Posterior heel spur absent.   Assessment:   1. Plantar fasciitis, right     Plan:  Patient was evaluated and treated  and all questions answered.  Plantar Fasciitis, right, improved significantly after R heel steroid injection at last visit  - XR reviewed as above.  - Educated on icing and stretching. Instructions given.  - Further Injection deferred at this time - DME: Dispensed power step orthotic for the patient instructed on use - Pharmacologic management: Meloxicam as needed for pain -Discussed that if the PowerStep orthotics are not fully effective at completely reducing her pain to call and we can get her back in to discuss making a pair of custom-made orthotics for her.   Return if symptoms worsen or fail to improve.        Everitt Amber, DPM [facility]                   05/12/2022

## 2022-06-24 ENCOUNTER — Ambulatory Visit (INDEPENDENT_AMBULATORY_CARE_PROVIDER_SITE_OTHER): Payer: Medicare HMO | Admitting: Sports Medicine

## 2022-06-24 ENCOUNTER — Ambulatory Visit (INDEPENDENT_AMBULATORY_CARE_PROVIDER_SITE_OTHER): Payer: Medicare HMO

## 2022-06-24 DIAGNOSIS — M7521 Bicipital tendinitis, right shoulder: Secondary | ICD-10-CM | POA: Diagnosis not present

## 2022-06-24 DIAGNOSIS — G5603 Carpal tunnel syndrome, bilateral upper limbs: Secondary | ICD-10-CM

## 2022-06-24 MED ORDER — TRIAMCINOLONE ACETONIDE 40 MG/ML IJ SUSP
80.0000 mg | Freq: Once | INTRAMUSCULAR | Status: AC
  Administered 2022-06-24: 80 mg via INTRAMUSCULAR

## 2022-06-24 NOTE — Assessment & Plan Note (Signed)
Helped daughter move about 3 weeks ago, increasing pain distal biceps worse with supination, adding home conditioning, return as needed.

## 2022-06-24 NOTE — Assessment & Plan Note (Addendum)
Pleasant 67 year old female, numbness tingling both hands thumb through the ring finger worse at night. Failed splinting, gabapentin, hydrodissections performed today. Return to see me in 6 weeks.

## 2022-06-24 NOTE — Progress Notes (Signed)
    Procedures performed today:    Procedure: Real-time Ultrasound Guided hydrodissection of the left median nerve at the carpal tunnel Device: Samsung HS60 Verbal informed consent obtained.  Time-out conducted.  Noted no overlying erythema, induration, or other signs of local infection.  Skin prepped in a sterile fashion.  Local anesthesia: Topical Ethyl chloride.  With sterile technique and under real time ultrasound guidance: Enlarged nerve noted, using a 25-gauge needle advanced into the carpal tunnel, taking care to avoid intraneural injection I injected medication both superficial to and deep to the median nerve freeing it from surrounding structures, I then redirected the needle deep and injected further medication around the flexor tendons deep within the carpal tunnel for a total of 1 cc kenalog 40, 5 cc 1% lidocaine without epinephrine. Completed without difficulty  Advised to call if fevers/chills, erythema, induration, drainage, or persistent bleeding.  Images permanently stored and available for review in PACS.  Impression: Technically successful ultrasound guided median nerve hydrodissection.   Procedure: Real-time Ultrasound Guided hydrodissection of the right median nerve at the carpal tunnel Device: Samsung HS60 Verbal informed consent obtained.  Time-out conducted.  Noted no overlying erythema, induration, or other signs of local infection.  Skin prepped in a sterile fashion.  Local anesthesia: Topical Ethyl chloride.  With sterile technique and under real time ultrasound guidance: Enlarged nerve noted, using a 25-gauge needle advanced into the carpal tunnel, taking care to avoid intraneural injection I injected medication both superficial to and deep to the median nerve freeing it from surrounding structures, I then redirected the needle deep and injected further medication around the flexor tendons deep within the carpal tunnel for a total of 1 cc kenalog 40, 5 cc 1%  lidocaine without epinephrine. Completed without difficulty  Advised to call if fevers/chills, erythema, induration, drainage, or persistent bleeding.  Images permanently stored and available for review in PACS.  Impression: Technically successful ultrasound guided median nerve hydrodissection.  Independent interpretation of notes and tests performed by another provider:   None.  Brief History, Exam, Impression, and Recommendations:    Carpal tunnel syndrome, bilateral Joanna Willis 67 year old female, numbness tingling both hands thumb through the ring finger worse at night. Failed splinting, gabapentin, hydrodissections performed today. Return to see me in 6 weeks.  Biceps tendinitis, right, distal Helped daughter move about 3 weeks ago, increasing pain distal biceps worse with supination, adding home conditioning, return as needed.    ____________________________________________ Gwen Her. Dianah Field, M.D., ABFM., CAQSM., AME. Primary Care and Sports Medicine [facility]  Adjunct Professor of [employer] [facility] of Medicine  Risk manager

## 2022-08-05 ENCOUNTER — Ambulatory Visit (INDEPENDENT_AMBULATORY_CARE_PROVIDER_SITE_OTHER): Payer: Medicare HMO | Admitting: Sports Medicine

## 2022-08-05 DIAGNOSIS — G5603 Carpal tunnel syndrome, bilateral upper limbs: Secondary | ICD-10-CM | POA: Diagnosis not present

## 2022-08-05 NOTE — Progress Notes (Signed)
    Procedures performed today:    None.  Independent interpretation of notes and tests performed by another provider:   None.  Brief History, Exam, Impression, and Recommendations:    Carpal tunnel syndrome, bilateral Pleasant 67 year old female, we treated her for clinically diagnosed carpal tunnel syndrome, she had numbness and tingling both hands thumb to the ring finger worse at night, she failed nighttime splinting, home physical therapy, gabapentin. I did bilateral carpal tunnel injections at the last visit, her left side is doing really well, the right side is doing much better but still she gets occasional paresthesias into the thumb. Stressing of the [facility] does not reproduce symptoms. She does not want a carpal tunnel release until after gardening season, I would like her to go ahead and get a consultation with Dr. Grandville Silos, I would also like Dr. Mina Marble involved for nerve conduction and EMG right upper extremity to ensure were not dealing with a C6 radiculitis.  I spent 30 minutes of total time managing this patient today, this includes chart review, face to face, and non-face to face time.  ____________________________________________ Gwen Her. Dianah Field, M.D., ABFM., CAQSM., AME. Primary Care and Sports Medicine [facility]  Adjunct Professor of [employer] [facility] of Medicine  Risk manager

## 2022-08-05 NOTE — Assessment & Plan Note (Signed)
Pleasant 67 year old female, we treated her for clinically diagnosed carpal tunnel syndrome, she had numbness and tingling both hands thumb to the ring finger worse at night, she failed nighttime splinting, home physical therapy, gabapentin. I did bilateral carpal tunnel injections at the last visit, her left side is doing really well, the right side is doing much better but still she gets occasional paresthesias into the thumb. Stressing of the [facility] does not reproduce symptoms. She does not want a carpal tunnel release until after gardening season, I would like her to go ahead and get a consultation with Dr. Grandville Silos, I would also like Dr. Mina Marble involved for nerve conduction and EMG right upper extremity to ensure were not dealing with a C6 radiculitis.

## 2022-08-12 DIAGNOSIS — G5603 Carpal tunnel syndrome, bilateral upper limbs: Secondary | ICD-10-CM | POA: Diagnosis not present

## 2022-08-20 DIAGNOSIS — G5601 Carpal tunnel syndrome, right upper limb: Secondary | ICD-10-CM | POA: Diagnosis not present

## 2022-09-18 ENCOUNTER — Other Ambulatory Visit: Payer: Self-pay | Admitting: Medical-Surgical

## 2022-10-22 HISTORY — PX: OTHER SURGICAL HISTORY: SHX169

## 2022-10-23 ENCOUNTER — Telehealth: Payer: Self-pay | Admitting: Medical-Surgical

## 2022-10-23 NOTE — Telephone Encounter (Signed)
Contacted Drema Pry to schedule their annual wellness visit. Patient declined to schedule AWV at this time.  Cira Servant Patient Engineer, production II Direct Dial: [phone]

## 2022-11-06 DIAGNOSIS — M65342 Trigger finger, left ring finger: Secondary | ICD-10-CM | POA: Diagnosis not present

## 2022-11-06 DIAGNOSIS — G5601 Carpal tunnel syndrome, right upper limb: Secondary | ICD-10-CM | POA: Diagnosis not present

## 2022-11-26 DIAGNOSIS — G5601 Carpal tunnel syndrome, right upper limb: Secondary | ICD-10-CM | POA: Diagnosis not present

## 2023-02-05 DIAGNOSIS — Z1231 Encounter for screening mammogram for malignant neoplasm of breast: Secondary | ICD-10-CM | POA: Diagnosis not present

## 2023-03-03 DIAGNOSIS — Z791 Long term (current) use of non-steroidal anti-inflammatories (NSAID): Secondary | ICD-10-CM | POA: Diagnosis not present

## 2023-03-03 DIAGNOSIS — G5601 Carpal tunnel syndrome, right upper limb: Secondary | ICD-10-CM | POA: Diagnosis not present

## 2023-03-03 DIAGNOSIS — J300 Vasomotor rhinitis: Secondary | ICD-10-CM | POA: Diagnosis not present

## 2023-03-03 DIAGNOSIS — Z8249 Family history of ischemic heart disease and other diseases of the circulatory system: Secondary | ICD-10-CM | POA: Diagnosis not present

## 2023-03-03 DIAGNOSIS — G2581 Restless legs syndrome: Secondary | ICD-10-CM | POA: Diagnosis not present

## 2023-03-03 DIAGNOSIS — R32 Unspecified urinary incontinence: Secondary | ICD-10-CM | POA: Diagnosis not present

## 2023-03-03 DIAGNOSIS — F324 Major depressive disorder, single episode, in partial remission: Secondary | ICD-10-CM | POA: Diagnosis not present

## 2023-03-03 DIAGNOSIS — Z885 Allergy status to narcotic agent status: Secondary | ICD-10-CM | POA: Diagnosis not present

## 2023-03-03 DIAGNOSIS — K219 Gastro-esophageal reflux disease without esophagitis: Secondary | ICD-10-CM | POA: Diagnosis not present

## 2023-03-03 DIAGNOSIS — J301 Allergic rhinitis due to pollen: Secondary | ICD-10-CM | POA: Diagnosis not present

## 2023-03-03 DIAGNOSIS — M1990 Unspecified osteoarthritis, unspecified site: Secondary | ICD-10-CM | POA: Diagnosis not present

## 2023-03-03 DIAGNOSIS — M65311 Trigger thumb, right thumb: Secondary | ICD-10-CM | POA: Diagnosis not present

## 2023-03-03 DIAGNOSIS — Z880 Allergy status to penicillin: Secondary | ICD-10-CM | POA: Diagnosis not present

## 2023-03-03 DIAGNOSIS — E785 Hyperlipidemia, unspecified: Secondary | ICD-10-CM | POA: Diagnosis not present

## 2023-03-12 DIAGNOSIS — M67912 Unspecified disorder of synovium and tendon, left shoulder: Secondary | ICD-10-CM | POA: Diagnosis not present

## 2023-04-22 ENCOUNTER — Ambulatory Visit: Payer: Medicare HMO | Admitting: Medical-Surgical

## 2023-04-22 VITALS — Ht 65.0 in | Wt 150.0 lb

## 2023-04-22 DIAGNOSIS — Z0000 Encounter for general adult medical examination without abnormal findings: Secondary | ICD-10-CM

## 2023-04-22 NOTE — Patient Instructions (Signed)
MEDICARE ANNUAL WELLNESS VISIT Health Maintenance Summary and Written Plan of Care  Joanna Willis ,  Thank you for allowing me to perform your Medicare Annual Wellness Visit and for your ongoing commitment to your health.   Health Maintenance & Immunization History Health Maintenance  Topic Date Due   Pneumonia Vaccine 11+ Years old (2 of 2 - PPSV23 or PCV20) 04/26/2023   DTaP/Tdap/Td (2 - Td or Tdap) 04/24/2023 (Originally 06/23/2020)   INFLUENZA VACCINE  04/24/2023 (Originally 01/22/2023)   COVID-19 Vaccine (4 - 2023-24 season) 05/08/2023 (Originally 02/22/2023)   Medicare Annual Wellness (AWV)  04/21/2024   Colonoscopy  10/23/2024   MAMMOGRAM  02/04/2025   DEXA SCAN  05/08/2027   Zoster Vaccines- Shingrix  Completed   HPV VACCINES  Aged Out   Hepatitis C Screening  Discontinued   Immunization History  Administered Date(s) Administered   Fluad Quad(high Dose 65+) 04/25/2022   Influenza Inj Mdck Quad Pf 04/06/2020   Influenza,inj,quad, With Preservative 03/23/2017   Influenza-Unspecified 03/23/2017, 03/28/2019, 04/22/2021   Moderna Covid-19 Vaccine Bivalent Booster 27yrs & up 04/19/2021   PFIZER(Purple Top)SARS-COV-2 Vaccination 06/25/2019, 07/16/2019   Pneumococcal Conjugate-13 04/25/2022   Tdap 06/23/2010   Zoster Recombinant(Shingrix) 10/26/2017, 06/23/2018    These are the patient goals that we discussed:  Goals Addressed               This Visit's Progress     Patient Stated (pt-stated)        Patient stated she would like to continue her currently healthy lifestyle.         This is a list of Health Maintenance Items that are overdue or due now: Td vaccine Influenza vaccine   Orders/Referrals Placed Today: No orders of the defined types were placed in this encounter.  (Contact our referral department at [phone] if you have not spoken with someone about your referral appointment within the next 5 days)    Follow-up Plan Follow-up with Christen Butter, NP  as planned Schedule tdap at the pharmacy.  Influenza vaccine can be done at the pharmacy or the office. Medicare wellness visit in one year.  Patient will access AVS on my chart.      Health Maintenance, Female Adopting a healthy lifestyle and getting preventive care are important in promoting health and wellness. Ask your health care provider about: The right schedule for you to have regular tests and exams. Things you can do on your own to prevent diseases and keep yourself healthy. What should I know about diet, weight, and exercise? Eat a healthy diet  Eat a diet that includes plenty of vegetables, fruits, low-fat dairy products, and lean protein. Do not eat a lot of foods that are high in solid fats, added sugars, or sodium. Maintain a healthy weight Body mass index (BMI) is used to identify weight problems. It estimates body fat based on height and weight. Your health care provider can help determine your BMI and help you achieve or maintain a healthy weight. Get regular exercise Get regular exercise. This is one of the most important things you can do for your health. Most adults should: Exercise for at least 150 minutes each week. The exercise should increase your heart rate and make you sweat (moderate-intensity exercise). Do strengthening exercises at least twice a week. This is in addition to the moderate-intensity exercise. Spend less time sitting. Even light physical activity can be beneficial. Watch cholesterol and blood lipids Have your blood tested for lipids and cholesterol at 66  years of age, then have this test every 5 years. Have your cholesterol levels checked more often if: Your lipid or cholesterol levels are high. You are older than 66 years of age. You are at high risk for heart disease. What should I know about cancer screening? Depending on your health history and family history, you may need to have cancer screening at various ages. This may include  screening for: Breast cancer. Cervical cancer. Colorectal cancer. Skin cancer. Lung cancer. What should I know about heart disease, diabetes, and high blood pressure? Blood pressure and heart disease High blood pressure causes heart disease and increases the risk of stroke. This is more likely to develop in people who have high blood pressure readings or are overweight. Have your blood pressure checked: Every 3-5 years if you are 63-25 years of age. Every year if you are 2 years old or older. Diabetes Have regular diabetes screenings. This checks your fasting blood sugar level. Have the screening done: Once every three years after age 34 if you are at a normal weight and have a low risk for diabetes. More often and at a younger age if you are overweight or have a high risk for diabetes. What should I know about preventing infection? Hepatitis B If you have a higher risk for hepatitis B, you should be screened for this virus. Talk with your health care provider to find out if you are at risk for hepatitis B infection. Hepatitis C Testing is recommended for: Everyone born from [birth year] through 1964. Anyone with known risk factors for hepatitis C. Sexually transmitted infections (STIs) Get screened for STIs, including gonorrhea and chlamydia, if: You are sexually active and are younger than 66 years of age. You are older than 66 years of age and your health care provider tells you that you are at risk for this type of infection. Your sexual activity has changed since you were last screened, and you are at increased risk for chlamydia or gonorrhea. Ask your health care provider if you are at risk. Ask your health care provider about whether you are at high risk for HIV. Your health care provider may recommend a prescription medicine to help prevent HIV infection. If you choose to take medicine to prevent HIV, you should first get tested for HIV. You should then be tested every 3 months for as  long as you are taking the medicine. Pregnancy If you are about to stop having your period (premenopausal) and you may become pregnant, seek counseling before you get pregnant. Take 400 to 800 micrograms (mcg) of folic acid every day if you become pregnant. Ask for birth control (contraception) if you want to prevent pregnancy. Osteoporosis and menopause Osteoporosis is a disease in which the bones lose minerals and strength with aging. This can result in bone fractures. If you are 46 years old or older, or if you are at risk for osteoporosis and fractures, ask your health care provider if you should: Be screened for bone loss. Take a calcium or vitamin D supplement to lower your risk of fractures. Be given hormone replacement therapy (HRT) to treat symptoms of menopause. Follow these instructions at home: Alcohol use Do not drink alcohol if: Your health care provider tells you not to drink. You are pregnant, may be pregnant, or are planning to become pregnant. If you drink alcohol: Limit how much you have to: 0-1 drink a day. Know how much alcohol is in your drink. In the [place] one drink equals  one 12 oz bottle of beer (355 mL), one 5 oz glass of wine (148 mL), or one 1 oz glass of hard liquor (44 mL). Lifestyle Do not use any products that contain nicotine or tobacco. These products include cigarettes, chewing tobacco, and vaping devices, such as e-cigarettes. If you need help quitting, ask your health care provider. Do not use street drugs. Do not share needles. Ask your health care provider for help if you need support or information about quitting drugs. General instructions Schedule regular health, dental, and eye exams. Stay current with your vaccines. Tell your health care provider if: You often feel depressed. You have ever been abused or do not feel safe at home. Summary Adopting a healthy lifestyle and getting preventive care are important in promoting health and  wellness. Follow your health care provider's instructions about healthy diet, exercising, and getting tested or screened for diseases. Follow your health care provider's instructions on monitoring your cholesterol and blood pressure. This information is not intended to replace advice given to you by your health care provider. Make sure you discuss any questions you have with your health care provider. Document Revised: 10/29/2020 Document Reviewed: 10/29/2020 Elsevier Patient Education  2024 [employer].

## 2023-04-22 NOTE — Progress Notes (Signed)
MEDICARE ANNUAL WELLNESS VISIT  04/22/2023  Telephone Visit Disclaimer This Medicare AWV was conducted by telephone due to national recommendations for restrictions regarding the COVID-19 Pandemic (e.g. social distancing).  I verified, using two identifiers, that I am speaking with Joanna Willis or their authorized healthcare agent. I discussed the limitations, risks, security, and privacy concerns of performing an evaluation and management service by telephone and the potential availability of an in-person appointment in the future. The patient expressed understanding and agreed to proceed.  Location of Patient: Home Location of Provider (nurse):  In the office.  Subjective:    Joanna Willis is a 67 y.o. female patient of Christen Butter, NP who had a Medicare Annual Wellness Visit today via telephone. Joanna Willis is Retired and lives with their family. she has 2 children. she reports that she is socially active and does interact with friends/family regularly. she is moderately physically active and enjoys gardening, knitting, sewing and reading.  Patient Care Team: Christen Butter, NP as PCP - General (Nurse Practitioner)     04/22/2023    8:06 AM 09/18/2021    9:55 AM 09/05/2021    8:15 AM 07/22/2021   11:07 AM 01/18/2020    1:18 PM 01/16/2020   10:10 AM 01/11/2020    1:19 PM  Advanced Directives  Does Patient Have a Medical Advance Directive? No No No No No No No  Would patient like information on creating a medical advance directive? No - Patient declined  No - Patient declined No - Patient declined No - Patient declined No - Patient declined     Hospital Utilization Over the Past 12 Months: # of hospitalizations or ER visits: 0 # of surgeries: 1  Review of Systems    Patient reports that her overall health is unchanged compared to last year.  History obtained from chart review and the patient  Patient Reported Readings (BP, Pulse, CBG, Weight, etc) Weight: 150 lb Height: 3f5 BP:  unable to obtain Pulse: unable to obtain  Per patient no change in vitals since last visit, unable to obtain new vitals due to telehealth visit  Pain Assessment Pain : No/denies pain     Current Medications & Allergies (verified) Allergies as of 04/22/2023       Reactions   Codeine Nausea Only   Severe constipation   Dust Mite Mixed Allergen Ext  [mite (d. Farinae)] Swelling   Penicillins Rash        Medication List        Accurate as of April 22, 2023  8:23 AM. If you have any questions, ask your nurse or doctor.          ADULT GUMMY PO Take 2 capsules by mouth daily.   atorvastatin 40 MG tablet Commonly known as: LIPITOR Take 1 tablet (40 mg total) by mouth daily.   citalopram 20 MG tablet Commonly known as: CELEXA Take 0.5-1 tablets (10-20 mg total) by mouth daily.   fluticasone 50 MCG/ACT nasal spray Commonly known as: FLONASE SPRAY 1 SPRAY INTO BOTH NOSTRILS DAILY AS NEEDED FOR ALLERGIES   gabapentin 300 MG capsule Commonly known as: NEURONTIN Take 1 capsule (300 mg total) by mouth at bedtime.   MELATONIN PO Take 3 mg by mouth at bedtime as needed (sleep).   meloxicam 15 MG tablet Commonly known as: MOBIC Take 0.5-1 tablets (7.5-15 mg total) by mouth daily.   omeprazole 20 MG capsule Commonly known as: PRILOSEC TAKE 1 CAPSULE BY MOUTH ONCE DAILY  BEFORE BREAKFAST   rOPINIRole 0.5 MG tablet Commonly known as: REQUIP TAKE 1 TABLET BY MOUTH EVERY DAY BEFORE BEDTIME FOR RESTLESS LEG   triamcinolone 0.1 % paste Commonly known as: KENALOG SMARTSIG:sparingly TO TEETH 4 Times Daily        History (reviewed): Past Medical History:  Diagnosis Date   Allergy    Arthritis    Colon polyps    Depression    GERD (gastroesophageal reflux disease)    Hypercholesteremia    PONV (postoperative nausea and vomiting)    Postmenopausal    Trigger finger    Wears glasses    Past Surgical History:  Procedure Laterality Date   carpet tunnel  surgery Right 10/2022   [place], [facility]   CESAREAN SECTION  2000   COLONOSCOPY     CONVERSION TO TOTAL KNEE Left 01/13/2018   Procedure: Revision of Left knee unicompartmental arthroplasty to total knee arthroplasty;  Surgeon: Ollen Gross, MD;  Location: [facility];  Service: Orthopedics;  Laterality: Left;   EXCISION MASS HEAD Left 09/18/2021   Procedure: EXCISION MASS LEFT FOREHEAD;  Surgeon: Janne Napoleon, MD;  Location[facility];  Service: Plastics;  Laterality: Left;   FOOT ARTHRODESIS  1995   right   HYSTERECTOMY ABDOMINAL WITH SALPINGECTOMY  2005   did not remove ovaries   KNEE ARTHROSCOPY WITH MEDIAL MENISECTOMY Left 05/17/2014   Procedure: LEFT ARTHROSCOPY KNEE WITH PARTIAL MEDIAL MENISECTOMY, CHONDROPLASTY OF LATERAL FEMORAL CONDYLE;  Surgeon: Harvie Junior, MD;  Location: [facility];  Service: Orthopedics;  Laterality: Left;   KNEE CLOSED REDUCTION Left 07/21/2018   Procedure: CLOSED MANIPULATION KNEE;  Surgeon: Ollen Gross, MD;  Location: [facility];  Service: Orthopedics;  Laterality: Left;    PARTIAL KNEE ARTHROPLASTY Left 01/21/2017   Procedure: UNICOMPARTMENTAL LEFT KNEE;  Surgeon: Ollen Gross, MD;  Location: [facility];  Service: Orthopedics;  Laterality: Left;   Revision left knee     Dr. Lequita Halt 01-13-18   TOTAL ABDOMINAL HYSTERECTOMY     TOTAL KNEE ARTHROPLASTY Right 01/16/2020   Procedure: RIGHT TOTAL KNEE ARTHROPLASTY;  Surgeon: Gean Birchwood, MD;  Location: [facility];  Service: Orthopedics;  Laterality: Right;   WISDOM TOOTH EXTRACTION     Family History  Problem Relation Age of Onset   Colon cancer Mother 65       lived 6 months after diagnosis   Colon polyps Sister    Diverticulosis Sister    Diverticulitis Sister    Coronary artery disease Father    Pancreatic cancer Neg Hx    Stomach cancer Neg Hx    Esophageal cancer Neg Hx    Rectal cancer Neg Hx    Social History   Socioeconomic History   Marital status: Married     Spouse name: Aneta Mins   Number of children: 2   Years of education: 16   Highest education level: Bachelor's degree (e.g., BA, AB, BS)  Occupational History   Occupation: Retired.  Tobacco Use   Smoking status: Never   Smokeless tobacco: Never  Vaping Use   Vaping status: Never Used  Substance and Sexual Activity   Alcohol use: Yes    Alcohol/week: 3.0 standard drinks of alcohol    Types: 3 Shots of liquor per week    Comment: 2-3 a week   Drug use: Never   Sexual activity: Not Currently    Partners: Male  Other Topics Concern   Not on file  Social History Narrative   Lives  with spouse and son. She enjoys gardening, knitting, sewing and reading.   Social Determinants of Health   Financial Resource Strain: Low Risk  (04/22/2023)   Overall Financial Resource Strain (CARDIA)    Difficulty of Paying Living Expenses: Not hard at all  Food Insecurity: No Food Insecurity (04/22/2023)   Hunger Vital Sign    Worried About Running Out of Food in the Last Year: Never true    Ran Out of Food in the Last Year: Never true  Transportation Needs: No Transportation Needs (04/22/2023)   PRAPARE - Administrator, Civil Service (Medical): No    Lack of Transportation (Non-Medical): No  Physical Activity: Sufficiently Active (04/22/2023)   Exercise Vital Sign    Days of Exercise per Week: 4 days    Minutes of Exercise per Session: 40 min  Stress: No Stress Concern Present (04/22/2023)   Joanna Willis [employer] Occupational [employer] - Occupational Stress Questionnaire    Feeling of Stress : Not at all  Social Connections: Moderately Isolated (04/22/2023)   Social Connection and Isolation Panel [NHANES]    Frequency of Communication with Friends and Family: More than three times a week    Frequency of Social Gatherings with Friends and Family: More than three times a week    Attends Religious Services: Never    Database administrator or Organizations: No    Attends Tax inspector Meetings: Never    Marital Status: Married    Activities of Daily Living    04/22/2023    8:13 AM  In your present state of health, do you have any difficulty performing the following activities:  Hearing? 0  Vision? 0  Difficulty concentrating or making decisions? 0  Walking or climbing stairs? 0  Dressing or bathing? 0  Doing errands, shopping? 0  Preparing Food and eating ? N  Using the Toilet? N  In the past six months, have you accidently leaked urine? Y  Comment stress incontinence  Do you have problems with loss of bowel control? N  Managing your Medications? N  Managing your Finances? N  Housekeeping or managing your Housekeeping? N    Patient Education/ Literacy How often do you need to have someone help you when you read instructions, pamphlets, or other written materials from your doctor or pharmacy?: 1 - Never What is the last grade level you completed in school?: Bachelors' degree  Exercise    Diet Patient reports consuming 3 meals a day and 0 snack(s) a day Patient reports that her primary diet is: Regular Patient reports that she does have regular access to food.   Depression Screen    04/22/2023    8:06 AM 04/25/2022   10:23 AM 02/28/2022    9:50 AM 04/24/2021   10:53 AM 04/20/2020   11:00 AM  PHQ 2/9 Scores  PHQ - 2 Score 0 0 0 0 0  PHQ- 9 Score     2     Fall Risk    04/22/2023    8:06 AM 04/25/2022   10:24 AM 04/24/2021   10:53 AM 04/20/2020   11:00 AM  Fall Risk   Falls in the past year? 0 0 0 0  Number falls in past yr: 0 0 0 0  Injury with Fall? 0 0 0 0  Risk for fall due to : No Fall Risks     Follow up Falls evaluation completed Falls evaluation completed Falls evaluation completed Falls evaluation completed  Objective:  Joanna Willis seemed alert and oriented and she participated appropriately during our telephone visit.  Blood Pressure Weight BMI  BP Readings from Last 3 Encounters:  05/01/22 106/68  04/25/22  118/73  03/26/22 120/73   Wt Readings from Last 3 Encounters:  04/22/23 150 lb (68 kg)  05/12/22 153 lb (69.4 kg)  05/01/22 152 lb 4 oz (69.1 kg)   BMI Readings from Last 1 Encounters:  04/22/23 24.96 kg/m    *Unable to obtain current vital signs, weight, and BMI due to telephone visit type  Hearing/Vision  Mayraalejandra did not seem to have difficulty with hearing/understanding during the telephone conversation Reports that she has had a formal eye exam by an eye care professional within the past year Reports that she has not had a formal hearing evaluation within the past year *Unable to fully assess hearing and vision during telephone visit type  Cognitive Function:    04/22/2023    8:15 AM  6CIT Screen  What Year? 0 points  What month? 0 points  What time? 0 points  Count back from 20 0 points  Months in reverse 0 points  Repeat phrase 0 points  Total Score 0 points   (Normal:0-7, Significant for Dysfunction: >8)  Normal Cognitive Function Screening: Yes   Immunization & Health Maintenance Record Immunization History  Administered Date(s) Administered   Fluad Quad(high Dose 65+) 04/25/2022   Influenza Inj Mdck Quad Pf 04/06/2020   Influenza,inj,quad, With Preservative 03/23/2017   Influenza-Unspecified 03/23/2017, 03/28/2019, 04/22/2021   Moderna Covid-19 Vaccine Bivalent Booster 33yrs & up 04/19/2021   PFIZER(Purple Top)SARS-COV-2 Vaccination 06/25/2019, 07/16/2019   Pneumococcal Conjugate-13 04/25/2022   Tdap 06/23/2010   Zoster Recombinant(Shingrix) 10/26/2017, 06/23/2018    Health Maintenance  Topic Date Due   Pneumonia Vaccine 84+ Years old (2 of 2 - PPSV23 or PCV20) 04/26/2023   DTaP/Tdap/Td (2 - Td or Tdap) 04/24/2023 (Originally 06/23/2020)   INFLUENZA VACCINE  04/24/2023 (Originally 01/22/2023)   COVID-19 Vaccine (4 - 2023-24 season) 05/08/2023 (Originally 02/22/2023)   Medicare Annual Wellness (AWV)  04/21/2024   Colonoscopy  10/23/2024   MAMMOGRAM  02/04/2025    DEXA SCAN  05/08/2027   Zoster Vaccines- Shingrix  Completed   HPV VACCINES  Aged Out   Hepatitis C Screening  Discontinued       Assessment  This is a routine wellness examination for [employer].  Health Maintenance: Due or Overdue Health Maintenance Due  Topic Date Due   Pneumonia Vaccine 20+ Years old (2 of 2 - PPSV23 or PCV20) 04/26/2023    Joanna Willis does not need a referral for Community Assistance: Care Management:   no Social Work:    no Prescription Assistance:  no Nutrition/Diabetes Education:  no   Plan:  Personalized Goals  Goals Addressed               This Visit's Progress     Patient Stated (pt-stated)        Patient stated she would like to continue her currently healthy lifestyle.       Personalized Health Maintenance & Screening Recommendations  Td vaccine Influenza vaccine  Lung Cancer Screening Recommended: no (Low Dose CT Chest recommended if Age 32-80 years, 20 pack-year currently smoking OR have quit w/in past 15 years) Hepatitis C Screening recommended: no HIV Screening recommended: no  Advanced Directives: Written information was not prepared per patient's request.  Referrals & Orders No orders of the defined types were placed in  this encounter.   Follow-up Plan Follow-up with Christen Butter, NP as planned Schedule tdap at the pharmacy.  Influenza vaccine can be done at the pharmacy or the office. Medicare wellness visit in one year.  Patient will access AVS on my chart.   I have personally reviewed and noted the following in the patient's chart:   Medical and social history Use of alcohol, tobacco or illicit drugs  Current medications and supplements Functional ability and status Nutritional status Physical activity Advanced directives List of other physicians Hospitalizations, surgeries, and ER visits in previous 12 months Vitals Screenings to include cognitive, depression, and falls Referrals and  appointments  In addition, I have reviewed and discussed with Joanna Willis certain preventive protocols, quality metrics, and best practice recommendations. A written personalized care plan for preventive services as well as general preventive health recommendations is available and can be mailed to the patient at her request.      Modesto Charon, RN BSN  04/22/2023

## 2023-04-30 ENCOUNTER — Ambulatory Visit (INDEPENDENT_AMBULATORY_CARE_PROVIDER_SITE_OTHER): Payer: Medicare HMO | Admitting: Medical-Surgical

## 2023-04-30 ENCOUNTER — Encounter: Payer: Self-pay | Admitting: Medical-Surgical

## 2023-04-30 VITALS — BP 119/77 | HR 67 | Resp 20 | Ht 65.0 in | Wt 153.1 lb

## 2023-04-30 DIAGNOSIS — Z23 Encounter for immunization: Secondary | ICD-10-CM | POA: Diagnosis not present

## 2023-04-30 DIAGNOSIS — F4323 Adjustment disorder with mixed anxiety and depressed mood: Secondary | ICD-10-CM

## 2023-04-30 DIAGNOSIS — E782 Mixed hyperlipidemia: Secondary | ICD-10-CM | POA: Diagnosis not present

## 2023-04-30 DIAGNOSIS — G2581 Restless legs syndrome: Secondary | ICD-10-CM | POA: Diagnosis not present

## 2023-04-30 DIAGNOSIS — Z0000 Encounter for general adult medical examination without abnormal findings: Secondary | ICD-10-CM | POA: Diagnosis not present

## 2023-04-30 NOTE — Progress Notes (Signed)
Complete physical exam  Patient: Joanna Willis   DOB: 1956/06/13   67 y.o. Female  MRN: [id number]  Subjective:    Chief Complaint  Patient presents with   Annual Exam    Joanna Willis is a 67 y.o. female who presents today for a complete physical exam. She reports consuming a general diet.  Exercising regularly with walking and riding an elliptical.  She generally feels well. She reports sleeping fairly well. She does have additional problems to discuss today.    Most recent fall risk assessment:    04/22/2023    8:06 AM  Fall Risk   Falls in the past year? 0  Number falls in past yr: 0  Injury with Fall? 0  Risk for fall due to : No Fall Risks  Follow up Falls evaluation completed     Most recent depression screenings:    04/22/2023    8:06 AM 04/25/2022   10:23 AM  PHQ 2/9 Scores  PHQ - 2 Score 0 0    Vision:Within last year and Dental: No current dental problems and Receives regular dental care    Patient Care Team: Christen Butter, NP as PCP - General (Nurse Practitioner)   Outpatient Medications Prior to Visit  Medication Sig   atorvastatin (LIPITOR) 40 MG tablet Take 1 tablet (40 mg total) by mouth daily.   citalopram (CELEXA) 20 MG tablet Take 0.5-1 tablets (10-20 mg total) by mouth daily.   fluticasone (FLONASE) 50 MCG/ACT nasal spray SPRAY 1 SPRAY INTO BOTH NOSTRILS DAILY AS NEEDED FOR ALLERGIES   MELATONIN PO Take 3 mg by mouth at bedtime as needed (sleep).   meloxicam (MOBIC) 15 MG tablet Take 0.5-1 tablets (7.5-15 mg total) by mouth daily.   Multiple Vitamins-Minerals (ADULT GUMMY PO) Take 2 capsules by mouth daily.   rOPINIRole (REQUIP) 0.5 MG tablet TAKE 1 TABLET BY MOUTH EVERY DAY BEFORE BEDTIME FOR RESTLESS LEG   triamcinolone (KENALOG) 0.1 % paste SMARTSIG:sparingly TO TEETH 4 Times Daily   omeprazole (PRILOSEC) 20 MG capsule TAKE 1 CAPSULE BY MOUTH ONCE DAILY BEFORE BREAKFAST   [DISCONTINUED] gabapentin (NEURONTIN) 300 MG capsule Take 1 capsule  (300 mg total) by mouth at bedtime. (Patient not taking: Reported on 04/22/2023)   No facility-administered medications prior to visit.    Review of Systems  Constitutional:  Negative for chills, fever, malaise/fatigue and weight loss.  HENT:  Negative for congestion, ear pain, hearing loss, sinus pain and sore throat.   Eyes:  Negative for blurred vision, photophobia and pain.  Respiratory:  Negative for cough, shortness of breath and wheezing.   Cardiovascular:  Negative for chest pain, palpitations and leg swelling.  Gastrointestinal:  Negative for abdominal pain, constipation, diarrhea, heartburn, nausea and vomiting.  Genitourinary:  Negative for dysuria, frequency and urgency.  Musculoskeletal:  Negative for falls and neck pain.  Skin:  Negative for itching and rash.  Neurological:  Negative for dizziness, weakness and headaches.  Endo/Heme/Allergies:  Negative for polydipsia. Does not bruise/bleed easily.  Psychiatric/Behavioral:  Negative for depression, substance abuse and suicidal ideas. The patient is not nervous/anxious.      Objective:    BP 119/77 (BP Location: Left Arm, Cuff Size: Normal)   Pulse 67   Resp 20   Ht 5\' 5"  (1.651 m)   Wt 153 lb 1.3 oz (69.4 kg)   SpO2 98%   BMI 25.47 kg/m    Physical Exam Vitals reviewed.  Constitutional:      General:  She is not in acute distress.    Appearance: Normal appearance. She is not ill-appearing.  HENT:     Head: Normocephalic and atraumatic.     Right Ear: Tympanic membrane, ear canal and external ear normal. There is no impacted cerumen.     Left Ear: Tympanic membrane, ear canal and external ear normal. There is no impacted cerumen.     Nose: Nose normal. No congestion or rhinorrhea.     Mouth/Throat:     Mouth: Mucous membranes are moist.     Pharynx: No oropharyngeal exudate or posterior oropharyngeal erythema.  Eyes:     General: No scleral icterus.       Right eye: No discharge.        Left eye: No  discharge.     Extraocular Movements: Extraocular movements intact.     Conjunctiva/sclera: Conjunctivae normal.     Pupils: Pupils are equal, round, and reactive to light.  Neck:     Thyroid: No thyromegaly.     Vascular: No carotid bruit or JVD.     Trachea: Trachea normal.  Cardiovascular:     Rate and Rhythm: Normal rate and regular rhythm.     Pulses: Normal pulses.     Heart sounds: Normal heart sounds. No murmur heard.    No friction rub. No gallop.  Pulmonary:     Effort: Pulmonary effort is normal. No respiratory distress.     Breath sounds: Normal breath sounds. No wheezing.  Abdominal:     General: Bowel sounds are normal. There is no distension.     Palpations: Abdomen is soft.     Tenderness: There is no abdominal tenderness. There is no guarding.  Musculoskeletal:        General: Normal range of motion.     Cervical back: Normal range of motion and neck supple.  Lymphadenopathy:     Cervical: No cervical adenopathy.  Skin:    General: Skin is warm and dry.  Neurological:     Mental Status: She is alert and oriented to person, place, and time.     Cranial Nerves: No cranial nerve deficit.  Psychiatric:        Mood and Affect: Mood normal.        Behavior: Behavior normal.        Thought Content: Thought content normal.        Judgment: Judgment normal.      No results found for any visits on 04/30/23.     Assessment & Plan:    Routine Health Maintenance and Physical Exam  Immunization History  Administered Date(s) Administered   Fluad Quad(high Dose 65+) 04/25/2022   Fluad Trivalent(High Dose 65+) 04/30/2023   Influenza Inj Mdck Quad Pf 04/06/2020   Influenza,inj,quad, With Preservative 03/23/2017   Influenza-Unspecified 03/23/2017, 03/28/2019, 04/22/2021   Moderna Covid-19 Vaccine Bivalent Booster 65yrs & up 04/19/2021   PFIZER(Purple Top)SARS-COV-2 Vaccination 06/25/2019, 07/16/2019   PNEUMOCOCCAL CONJUGATE-20 04/30/2023   Pneumococcal  Conjugate-13 04/25/2022   Tdap 06/23/2010   Zoster Recombinant(Shingrix) 10/26/2017, 06/23/2018    Health Maintenance  Topic Date Due   COVID-19 Vaccine (4 - 2023-24 season) 05/08/2023 (Originally 02/22/2023)   DTaP/Tdap/Td (2 - Td or Tdap) 04/29/2024 (Originally 06/23/2020)   Medicare Annual Wellness (AWV)  04/21/2024   Colonoscopy  10/23/2024   MAMMOGRAM  02/04/2025   DEXA SCAN  05/08/2027   Pneumonia Vaccine 18+ Years old  Completed   INFLUENZA VACCINE  Completed   Zoster Vaccines- Shingrix  Completed  HPV VACCINES  Aged Out   Hepatitis C Screening  Discontinued    Discussed health benefits of physical activity, and encouraged her to engage in regular exercise appropriate for her age and condition.  1. Annual physical exam Checking labs as below.  Up-to-date on preventative care.  Wellness information provided with AVS. - CBC with Differential/Platelet - CMP14+EGFR  2. Adjustment disorder with mixed anxiety and depressed mood Stable.  Continue Celexa as prescribed.  3. Mixed hyperlipidemia Checking lipids.  Continue atorvastatin. - Lipid panel  4. Restless leg syndrome Continue Requip 0.5 mg nightly as needed.  5. Need for influenza vaccination Flu vaccine given in office. - Flu Vaccine Trivalent High Dose (Fluad)  6. Need for pneumococcal 20-valent conjugate vaccination Prevnar 20 given in office.  Next pneumococcal vaccine due in 5 years. - Pneumococcal conjugate vaccine 20-valent (Prevnar 20)  Return in about 1 year (around 04/29/2024) for annual physical exam or sooner if needed.   Christen Butter, NP

## 2023-04-30 NOTE — Patient Instructions (Signed)
Preventive Care 65 Years and Older, Female Preventive care refers to lifestyle choices and visits with your health care provider that can promote health and wellness. Preventive care visits are also called wellness exams. What can I expect for my preventive care visit? Counseling Your health care provider may ask you questions about your: Medical history, including: Past medical problems. Family medical history. Pregnancy and menstrual history. History of falls. Current health, including: Memory and ability to understand (cognition). Emotional well-being. Home life and relationship well-being. Sexual activity and sexual health. Lifestyle, including: Alcohol, nicotine or tobacco, and drug use. Access to firearms. Diet, exercise, and sleep habits. Work and work environment. Sunscreen use. Safety issues such as seatbelt and bike helmet use. Physical exam Your health care provider will check your: Height and weight. These may be used to calculate your BMI (body mass index). BMI is a measurement that tells if you are at a healthy weight. Waist circumference. This measures the distance around your waistline. This measurement also tells if you are at a healthy weight and may help predict your risk of certain diseases, such as type 2 diabetes and high blood pressure. Heart rate and blood pressure. Body temperature. Skin for abnormal spots. What immunizations do I need?  Vaccines are usually given at various ages, according to a schedule. Your health care provider will recommend vaccines for you based on your age, medical history, and lifestyle or other factors, such as travel or where you work. What tests do I need? Screening Your health care provider may recommend screening tests for certain conditions. This may include: Lipid and cholesterol levels. Hepatitis C test. Hepatitis B test. HIV (human immunodeficiency virus) test. STI (sexually transmitted infection) testing, if you are at  risk. Lung cancer screening. Colorectal cancer screening. Diabetes screening. This is done by checking your blood sugar (glucose) after you have not eaten for a while (fasting). Mammogram. Talk with your health care provider about how often you should have regular mammograms. BRCA-related cancer screening. This may be done if you have a family history of breast, ovarian, tubal, or peritoneal cancers. Bone density scan. This is done to screen for osteoporosis. Talk with your health care provider about your test results, treatment options, and if necessary, the need for more tests. Follow these instructions at home: Eating and drinking  Eat a diet that includes fresh fruits and vegetables, whole grains, lean protein, and low-fat dairy products. Limit your intake of foods with high amounts of sugar, saturated fats, and salt. Take vitamin and mineral supplements as recommended by your health care provider. Do not drink alcohol if your health care provider tells you not to drink. If you drink alcohol: Limit how much you have to 0-1 drink a day. Know how much alcohol is in your drink. In the [place] one drink equals one 12 oz bottle of beer (355 mL), one 5 oz glass of wine (148 mL), or one 1 oz glass of hard liquor (44 mL). Lifestyle Brush your teeth every morning and night with fluoride toothpaste. Floss one time each day. Exercise for at least 30 minutes 5 or more days each week. Do not use any products that contain nicotine or tobacco. These products include cigarettes, chewing tobacco, and vaping devices, such as e-cigarettes. If you need help quitting, ask your health care provider. Do not use drugs. If you are sexually active, practice safe sex. Use a condom or other form of protection in order to prevent STIs. Take aspirin only as told by   your health care provider. Make sure that you understand how much to take and what form to take. Work with your health care provider to find out whether it  is safe and beneficial for you to take aspirin daily. Ask your health care provider if you need to take a cholesterol-lowering medicine (statin). Find healthy ways to manage stress, such as: Meditation, yoga, or listening to music. Journaling. Talking to a trusted person. Spending time with friends and family. Minimize exposure to UV radiation to reduce your risk of skin cancer. Safety Always wear your seat belt while driving or riding in a vehicle. Do not drive: If you have been drinking alcohol. Do not ride with someone who has been drinking. When you are tired or distracted. While texting. If you have been using any mind-altering substances or drugs. Wear a helmet and other protective equipment during sports activities. If you have firearms in your house, make sure you follow all gun safety procedures. What's next? Visit your health care provider once a year for an annual wellness visit. Ask your health care provider how often you should have your eyes and teeth checked. Stay up to date on all vaccines. This information is not intended to replace advice given to you by your health care provider. Make sure you discuss any questions you have with your health care provider. Document Revised: 12/05/2020 Document Reviewed: 12/05/2020 Elsevier Patient Education  2024 [employer]  

## 2023-05-01 LAB — CBC WITH DIFFERENTIAL/PLATELET
Basophils Absolute: 0.1 10*3/uL (ref 0.0–0.2)
Basos: 1 %
EOS (ABSOLUTE): 0.2 10*3/uL (ref 0.0–0.4)
Eos: 5 %
Hematocrit: 43 % (ref 34.0–46.6)
Hemoglobin: 14.2 g/dL (ref 11.1–15.9)
Immature Grans (Abs): 0 10*3/uL (ref 0.0–0.1)
Immature Granulocytes: 0 %
Lymphocytes Absolute: 1.3 10*3/uL (ref 0.7–3.1)
Lymphs: 29 %
MCH: 31.1 pg (ref 26.6–33.0)
MCHC: 33 g/dL (ref 31.5–35.7)
MCV: 94 fL (ref 79–97)
Monocytes Absolute: 0.4 10*3/uL (ref 0.1–0.9)
Monocytes: 9 %
Neutrophils Absolute: 2.5 10*3/uL (ref 1.4–7.0)
Neutrophils: 56 %
Platelets: 258 10*3/uL (ref 150–450)
RBC: 4.57 x10E6/uL (ref 3.77–5.28)
RDW: 11.9 % (ref 11.7–15.4)
WBC: 4.5 10*3/uL (ref 3.4–10.8)

## 2023-05-01 LAB — CMP14+EGFR
ALT: 23 [IU]/L (ref 0–32)
AST: 28 [IU]/L (ref 0–40)
Albumin: 4.6 g/dL (ref 3.9–4.9)
Alkaline Phosphatase: 79 [IU]/L (ref 44–121)
BUN/Creatinine Ratio: 28 (ref 12–28)
BUN: 20 mg/dL (ref 8–27)
Bilirubin Total: 0.6 mg/dL (ref 0.0–1.2)
CO2: 25 mmol/L (ref 20–29)
Calcium: 9.4 mg/dL (ref 8.7–10.3)
Chloride: 103 mmol/L (ref 96–106)
Creatinine, Ser: 0.71 mg/dL (ref 0.57–1.00)
Globulin, Total: 2.1 g/dL (ref 1.5–4.5)
Glucose: 103 mg/dL — ABNORMAL HIGH (ref 70–99)
Potassium: 4.5 mmol/L (ref 3.5–5.2)
Sodium: 143 mmol/L (ref 134–144)
Total Protein: 6.7 g/dL (ref 6.0–8.5)
eGFR: 94 mL/min/{1.73_m2} (ref >59)

## 2023-05-01 LAB — LIPID PANEL
Chol/HDL Ratio: 2.4 ratio (ref 0.0–4.4)
Cholesterol, Total: 205 mg/dL — ABNORMAL HIGH (ref 100–199)
HDL: 87 mg/dL (ref >39)
LDL Chol Calc (NIH): 104 mg/dL — ABNORMAL HIGH (ref 0–99)
Triglycerides: 79 mg/dL (ref 0–149)
VLDL Cholesterol Cal: 14 mg/dL (ref 5–40)

## 2023-05-03 ENCOUNTER — Other Ambulatory Visit: Payer: Self-pay | Admitting: Medical-Surgical

## 2023-05-03 DIAGNOSIS — K219 Gastro-esophageal reflux disease without esophagitis: Secondary | ICD-10-CM

## 2023-05-04 ENCOUNTER — Encounter: Payer: Self-pay | Admitting: Medical-Surgical

## 2023-05-07 ENCOUNTER — Encounter: Payer: Self-pay | Admitting: Medical-Surgical

## 2023-05-07 DIAGNOSIS — G8929 Other chronic pain: Secondary | ICD-10-CM

## 2023-05-12 DIAGNOSIS — M25512 Pain in left shoulder: Secondary | ICD-10-CM | POA: Diagnosis not present

## 2023-05-12 DIAGNOSIS — M65312 Trigger thumb, left thumb: Secondary | ICD-10-CM | POA: Diagnosis not present

## 2023-05-18 DIAGNOSIS — M7542 Impingement syndrome of left shoulder: Secondary | ICD-10-CM | POA: Diagnosis not present

## 2023-05-18 DIAGNOSIS — M6281 Muscle weakness (generalized): Secondary | ICD-10-CM | POA: Diagnosis not present

## 2023-05-20 ENCOUNTER — Other Ambulatory Visit: Payer: Self-pay | Admitting: Medical-Surgical

## 2023-05-20 DIAGNOSIS — F4323 Adjustment disorder with mixed anxiety and depressed mood: Secondary | ICD-10-CM

## 2023-05-25 ENCOUNTER — Encounter: Payer: Self-pay | Admitting: Medical-Surgical

## 2023-05-25 DIAGNOSIS — E782 Mixed hyperlipidemia: Secondary | ICD-10-CM

## 2023-05-25 MED ORDER — ATORVASTATIN CALCIUM 40 MG PO TABS: 40.0000 mg | ORAL_TABLET | Freq: Every day | ORAL | 3 refills | Status: DC

## 2023-06-02 ENCOUNTER — Other Ambulatory Visit: Payer: Self-pay | Admitting: Medical-Surgical

## 2023-06-08 ENCOUNTER — Encounter: Payer: Self-pay | Admitting: Medical-Surgical

## 2023-06-08 MED ORDER — TRIAMCINOLONE ACETONIDE 0.1 % MT PSTE: PASTE | OROMUCOSAL | 0 refills | Status: DC

## 2023-06-08 NOTE — Telephone Encounter (Signed)
Spoke with patient. Medication shown that was sent on 06/02/23. Called pharmacy and they did not have this-  Resent to pharmacy per protocol. Patient will contact us if still not received tomorrow.

## 2023-07-22 ENCOUNTER — Encounter (HOSPITAL_BASED_OUTPATIENT_CLINIC_OR_DEPARTMENT_OTHER): Payer: Self-pay | Admitting: Orthopedic Surgery

## 2023-07-22 DIAGNOSIS — G8929 Other chronic pain: Secondary | ICD-10-CM

## 2023-07-26 ENCOUNTER — Other Ambulatory Visit: Payer: Self-pay | Admitting: Medical-Surgical

## 2023-07-29 ENCOUNTER — Other Ambulatory Visit (HOSPITAL_BASED_OUTPATIENT_CLINIC_OR_DEPARTMENT_OTHER): Payer: Self-pay | Admitting: Orthopedic Surgery

## 2023-07-29 DIAGNOSIS — G8929 Other chronic pain: Secondary | ICD-10-CM

## 2023-08-09 ENCOUNTER — Ambulatory Visit (HOSPITAL_BASED_OUTPATIENT_CLINIC_OR_DEPARTMENT_OTHER)
Admission: RE | Admit: 2023-08-09 | Discharge: 2023-08-09 | Disposition: A | Discharge status: Home / Self Care | Payer: Medicare HMO | Source: Ambulatory Visit | Attending: Orthopedic Surgery | Admitting: Orthopedic Surgery

## 2023-08-09 DIAGNOSIS — G8929 Other chronic pain: Secondary | ICD-10-CM | POA: Insufficient documentation

## 2023-08-09 DIAGNOSIS — S46212A Strain of muscle, fascia and tendon of other parts of biceps, left arm, initial encounter: Secondary | ICD-10-CM | POA: Diagnosis not present

## 2023-08-09 DIAGNOSIS — M25512 Pain in left shoulder: Secondary | ICD-10-CM | POA: Diagnosis not present

## 2023-08-09 DIAGNOSIS — M19012 Primary osteoarthritis, left shoulder: Secondary | ICD-10-CM | POA: Diagnosis not present

## 2023-08-09 DIAGNOSIS — M7582 Other shoulder lesions, left shoulder: Secondary | ICD-10-CM | POA: Diagnosis not present

## 2023-08-09 DIAGNOSIS — M948X1 Other specified disorders of cartilage, shoulder: Secondary | ICD-10-CM | POA: Diagnosis not present

## 2023-08-20 DIAGNOSIS — M67912 Unspecified disorder of synovium and tendon, left shoulder: Secondary | ICD-10-CM | POA: Diagnosis not present

## 2023-10-02 ENCOUNTER — Encounter: Payer: Self-pay | Admitting: Medical-Surgical

## 2023-12-31 DIAGNOSIS — S8265XA Nondisplaced fracture of lateral malleolus of left fibula, initial encounter for closed fracture: Secondary | ICD-10-CM | POA: Diagnosis not present

## 2024-01-17 ENCOUNTER — Other Ambulatory Visit: Payer: Self-pay | Admitting: Medical-Surgical

## 2024-01-30 DIAGNOSIS — R21 Rash and other nonspecific skin eruption: Secondary | ICD-10-CM | POA: Diagnosis not present

## 2024-02-23 ENCOUNTER — Encounter: Payer: Self-pay | Admitting: Sports Medicine

## 2024-03-11 DIAGNOSIS — Z1231 Encounter for screening mammogram for malignant neoplasm of breast: Secondary | ICD-10-CM | POA: Diagnosis not present

## 2024-03-11 LAB — HM MAMMOGRAPHY

## 2024-04-20 DIAGNOSIS — M79673 Pain in unspecified foot: Secondary | ICD-10-CM

## 2024-04-30 ENCOUNTER — Other Ambulatory Visit: Payer: Self-pay | Admitting: Medical-Surgical

## 2024-04-30 DIAGNOSIS — K219 Gastro-esophageal reflux disease without esophagitis: Secondary | ICD-10-CM

## 2024-04-30 DIAGNOSIS — F4323 Adjustment disorder with mixed anxiety and depressed mood: Secondary | ICD-10-CM

## 2024-05-04 ENCOUNTER — Ambulatory Visit: Admitting: Podiatry

## 2024-05-04 NOTE — Progress Notes (Unsigned)
 Complete physical exam  Patient: Joanna Willis   DOB: Mar 31, 1956   68 y.o. Female  MRN: [id number]  Subjective:    Chief Complaint  Patient presents with   Annual Exam   BARBIE CROSTON is a 68 y.o. female who presents today for a complete physical exam. She reports consuming a general diet. Walking dogs twice daily and rides horses regularly. She generally feels well. She reports sleeping well. She does not have additional problems to discuss today.    Most recent fall risk assessment:    04/22/2023    8:06 AM  Fall Risk   Falls in the past year? 0  Number falls in past yr: 0  Injury with Fall? 0  Risk for fall due to : No Fall Risks  Follow up Falls evaluation completed    Most recent depression screenings:    05/05/2024   10:03 AM 04/22/2023    8:06 AM  PHQ 2/9 Scores  PHQ - 2 Score 0 0    Vision:Within last year and Dental: No current dental problems and Receives regular dental care  {History (Optional):23778}  Patient Care Team: Willo Mini, NP as PCP - General (Nurse Practitioner)   Outpatient Medications Prior to Visit  Medication Sig   atorvastatin  (LIPITOR) 40 MG tablet Take 1 tablet (40 mg total) by mouth daily.   citalopram  (CELEXA ) 20 MG tablet TAKE 0.5-1 TABLETS (10-20 MG TOTAL) BY MOUTH DAILY.   fluticasone  (FLONASE ) 50 MCG/ACT nasal spray SPRAY 1 SPRAY INTO BOTH NOSTRILS DAILY AS NEEDED FOR ALLERGIES   MELATONIN PO Take 3 mg by mouth at bedtime as needed (sleep).   meloxicam  (MOBIC ) 15 MG tablet TAKE 1/2 TO 1 TABLET (7.5-15 MG TOTAL) BY MOUTH DAILY   Multiple Vitamins-Minerals (ADULT GUMMY PO) Take 2 capsules by mouth daily.   omeprazole  (PRILOSEC) 20 MG capsule TAKE 1 CAPSULE BY MOUTH EVERY DAY BEFORE BREAKFAST   rOPINIRole  (REQUIP ) 0.5 MG tablet TAKE 1 TABLET BY MOUTH EVERY DAY BEFORE BEDTIME FOR RESTLESS LEG   triamcinolone  (KENALOG ) 0.1 % paste SMARTSIG:sparingly TO TEETH 4 Times Daily   No facility-administered medications prior to visit.    Review of Systems  Constitutional:  Negative for chills, fever, malaise/fatigue and weight loss.  HENT:  Negative for congestion, ear pain, hearing loss, sinus pain and sore throat.   Eyes:  Negative for blurred vision, photophobia and pain.  Respiratory:  Negative for cough, shortness of breath and wheezing.   Cardiovascular:  Negative for chest pain, palpitations and leg swelling.  Gastrointestinal:  Negative for abdominal pain, constipation, diarrhea, heartburn, nausea and vomiting.  Genitourinary:  Negative for dysuria, frequency and urgency.  Musculoskeletal:  Negative for falls and neck pain.  Skin:  Negative for itching and rash.  Neurological:  Negative for dizziness, weakness and headaches.  Endo/Heme/Allergies:  Negative for polydipsia. Does not bruise/bleed easily.  Psychiatric/Behavioral:  Negative for depression, substance abuse and suicidal ideas. The patient is not nervous/anxious.      Objective:     BP 108/66 (BP Location: Right Arm, Cuff Size: Normal)   Pulse 61   Resp 20   Ht 5' 5 (1.651 m)   Wt 153 lb 2.1 oz (69.5 kg)   SpO2 99%   BMI 25.48 kg/m  {Vitals History (Optional):23777}  Physical Exam Constitutional:      General: She is not in acute distress.    Appearance: Normal appearance. She is not ill-appearing.  HENT:     Head: Normocephalic and atraumatic.  Right Ear: Tympanic membrane, ear canal and external ear normal. There is no impacted cerumen.     Left Ear: Tympanic membrane, ear canal and external ear normal. There is no impacted cerumen.     Nose: Nose normal. No congestion or rhinorrhea.     Mouth/Throat:     Mouth: Mucous membranes are moist.     Pharynx: No oropharyngeal exudate or posterior oropharyngeal erythema.  Eyes:     General: No scleral icterus.       Right eye: No discharge.        Left eye: No discharge.     Extraocular Movements: Extraocular movements intact.     Conjunctiva/sclera: Conjunctivae normal.     Pupils:  Pupils are equal, round, and reactive to light.  Neck:     Thyroid : No thyromegaly.     Vascular: No carotid bruit or JVD.     Trachea: Trachea normal.  Cardiovascular:     Rate and Rhythm: Normal rate and regular rhythm.     Pulses: Normal pulses.     Heart sounds: Normal heart sounds. No murmur heard.    No friction rub. No gallop.  Pulmonary:     Effort: Pulmonary effort is normal. No respiratory distress.     Breath sounds: Normal breath sounds. No wheezing.  Abdominal:     General: Bowel sounds are normal. There is no distension.     Palpations: Abdomen is soft.     Tenderness: There is no abdominal tenderness. There is no guarding.  Musculoskeletal:        General: Normal range of motion.     Cervical back: Normal range of motion and neck supple.  Lymphadenopathy:     Cervical: No cervical adenopathy.  Skin:    General: Skin is warm and dry.  Neurological:     Mental Status: She is alert and oriented to person, place, and time.     Cranial Nerves: No cranial nerve deficit.  Psychiatric:        Mood and Affect: Mood normal.        Behavior: Behavior normal.        Thought Content: Thought content normal.        Judgment: Judgment normal.      No results found for any visits on 05/05/24. {Show previous labs (optional):23779}    Assessment & Plan:    Routine Health Maintenance and Physical Exam  Immunization History  Administered Date(s) Administered   Fluad Quad(high Dose 65+) 04/25/2022   Fluad Trivalent(High Dose 65+) 04/30/2023   Influenza Inj Mdck Quad Pf 04/06/2020   Influenza,inj,quad, With Preservative 03/23/2017   Influenza-Unspecified 03/23/2017, 03/28/2019, 04/22/2021   Moderna Covid-19 Vaccine Bivalent Booster 28yrs & up 04/19/2021   PFIZER(Purple Top)SARS-COV-2 Vaccination 06/25/2019, 07/16/2019   PNEUMOCOCCAL CONJUGATE-20 04/30/2023   Pneumococcal Conjugate-13 04/25/2022   Tdap 06/11/2023   Zoster Recombinant(Shingrix) 10/26/2017, 06/23/2018     Health Maintenance  Topic Date Due   Influenza Vaccine  01/22/2024   Medicare Annual Wellness (AWV)  04/21/2024   COVID-19 Vaccine (4 - 2025-26 season) 05/21/2024 (Originally 02/22/2024)   Colonoscopy  10/23/2024   Mammogram  03/11/2026   DEXA SCAN  05/08/2027   DTaP/Tdap/Td (2 - Td or Tdap) 06/10/2033   Pneumococcal Vaccine: 50+ Years  Completed   Zoster Vaccines- Shingrix  Completed   Meningococcal B Vaccine  Aged Out   Hepatitis C Screening  Discontinued    Discussed health benefits of physical activity, and encouraged her to engage in regular  exercise appropriate for her age and condition.  Problem List Items Addressed This Visit       Digestive   GERD (gastroesophageal reflux disease)     Other   Adjustment disorder with mixed anxiety and depressed mood   Mixed hyperlipidemia   Other Visit Diagnoses       Annual physical exam    -  Primary     Need for influenza vaccination          No follow-ups on file.     Kamile Fassler, NP

## 2024-05-04 NOTE — Patient Instructions (Signed)
 Preventive Care 83 Years and Older, Female Preventive care refers to lifestyle choices and visits with your health care provider that can promote health and wellness. Preventive care visits are also called wellness exams. What can I expect for my preventive care visit? Counseling Your health care provider may ask you questions about your: Medical history, including: Past medical problems. Family medical history. Pregnancy and menstrual history. History of falls. Current health, including: Memory and ability to understand (cognition). Emotional well-being. Home life and relationship well-being. Sexual activity and sexual health. Lifestyle, including: Alcohol, nicotine or tobacco, and drug use. Access to firearms. Diet, exercise, and sleep habits. Work and work Astronomer. Sunscreen use. Safety issues such as seatbelt and bike helmet use. Physical exam Your health care provider will check your: Height and weight. These may be used to calculate your BMI (body mass index). BMI is a measurement that tells if you are at a healthy weight. Waist circumference. This measures the distance around your waistline. This measurement also tells if you are at a healthy weight and may help predict your risk of certain diseases, such as type 2 diabetes and high blood pressure. Heart rate and blood pressure. Body temperature. Skin for abnormal spots. What immunizations do I need?  Vaccines are usually given at various ages, according to a schedule. Your health care provider will recommend vaccines for you based on your age, medical history, and lifestyle or other factors, such as travel or where you work. What tests do I need? Screening Your health care provider may recommend screening tests for certain conditions. This may include: Lipid and cholesterol levels. Hepatitis C test. Hepatitis B test. HIV (human immunodeficiency virus) test. STI (sexually transmitted infection) testing, if you are at  risk. Lung cancer screening. Colorectal cancer screening. Diabetes screening. This is done by checking your blood sugar (glucose) after you have not eaten for a while (fasting). Mammogram. Talk with your health care provider about how often you should have regular mammograms. BRCA-related cancer screening. This may be done if you have a family history of breast, ovarian, tubal, or peritoneal cancers. Bone density scan. This is done to screen for osteoporosis. Talk with your health care provider about your test results, treatment options, and if necessary, the need for more tests. Follow these instructions at home: Eating and drinking  Eat a diet that includes fresh fruits and vegetables, whole grains, lean protein, and low-fat dairy products. Limit your intake of foods with high amounts of sugar, saturated fats, and salt. Take vitamin and mineral supplements as recommended by your health care provider. Do not drink alcohol if your health care provider tells you not to drink. If you drink alcohol: Limit how much you have to 0-1 drink a day. Know how much alcohol is in your drink. In the [place] one drink equals one 12 oz bottle of beer (355 mL), one 5 oz glass of wine (148 mL), or one 1 oz glass of hard liquor (44 mL). Lifestyle Brush your teeth every morning and night with fluoride toothpaste. Floss one time each day. Exercise for at least 30 minutes 5 or more days each week. Do not use any products that contain nicotine or tobacco. These products include cigarettes, chewing tobacco, and vaping devices, such as e-cigarettes. If you need help quitting, ask your health care provider. Do not use drugs. If you are sexually active, practice safe sex. Use a condom or other form of protection in order to prevent STIs. Take aspirin only as told by  your health care provider. Make sure that you understand how much to take and what form to take. Work with your health care provider to find out whether it  is safe and beneficial for you to take aspirin daily. Ask your health care provider if you need to take a cholesterol-lowering medicine (statin). Find healthy ways to manage stress, such as: Meditation, yoga, or listening to music. Journaling. Talking to a trusted person. Spending time with friends and family. Minimize exposure to UV radiation to reduce your risk of skin cancer. Safety Always wear your seat belt while driving or riding in a vehicle. Do not drive: If you have been drinking alcohol. Do not ride with someone who has been drinking. When you are tired or distracted. While texting. If you have been using any mind-altering substances or drugs. Wear a helmet and other protective equipment during sports activities. If you have firearms in your house, make sure you follow all gun safety procedures. What's next? Visit your health care provider once a year for an annual wellness visit. Ask your health care provider how often you should have your eyes and teeth checked. Stay up to date on all vaccines. This information is not intended to replace advice given to you by your health care provider. Make sure you discuss any questions you have with your health care provider. Document Revised: 12/05/2020 Document Reviewed: 12/05/2020 Elsevier Patient Education  2024 [employer].

## 2024-05-05 ENCOUNTER — Ambulatory Visit (INDEPENDENT_AMBULATORY_CARE_PROVIDER_SITE_OTHER): Payer: Medicare HMO | Admitting: Medical-Surgical

## 2024-05-05 ENCOUNTER — Encounter: Payer: Self-pay | Admitting: Medical-Surgical

## 2024-05-05 VITALS — BP 108/66 | HR 61 | Resp 20 | Ht 65.0 in | Wt 153.1 lb

## 2024-05-05 DIAGNOSIS — K219 Gastro-esophageal reflux disease without esophagitis: Secondary | ICD-10-CM | POA: Diagnosis not present

## 2024-05-05 DIAGNOSIS — Z0000 Encounter for general adult medical examination without abnormal findings: Secondary | ICD-10-CM | POA: Diagnosis not present

## 2024-05-05 DIAGNOSIS — Z23 Encounter for immunization: Secondary | ICD-10-CM

## 2024-05-05 DIAGNOSIS — F4323 Adjustment disorder with mixed anxiety and depressed mood: Secondary | ICD-10-CM | POA: Diagnosis not present

## 2024-05-05 DIAGNOSIS — E782 Mixed hyperlipidemia: Secondary | ICD-10-CM | POA: Diagnosis not present

## 2024-05-05 MED ORDER — FLUTICASONE PROPIONATE 50 MCG/ACT NA SUSP: NASAL | 1 refills | Status: AC

## 2024-05-05 MED ORDER — CITALOPRAM HYDROBROMIDE 20 MG PO TABS
10.0000 mg | ORAL_TABLET | Freq: Every day | ORAL | 3 refills | Status: AC
Start: 2024-05-05 — End: ?

## 2024-05-05 MED ORDER — ATORVASTATIN CALCIUM 40 MG PO TABS: 40.0000 mg | ORAL_TABLET | Freq: Every day | ORAL | 3 refills | Status: AC

## 2024-05-05 MED ORDER — TRIAMCINOLONE ACETONIDE 0.1 % MT PSTE: PASTE | OROMUCOSAL | 0 refills | Status: DC

## 2024-05-05 MED ORDER — OMEPRAZOLE 20 MG PO CPDR: 20.0000 mg | DELAYED_RELEASE_CAPSULE | Freq: Every day | ORAL | 3 refills | Status: AC

## 2024-05-05 MED ORDER — MELOXICAM 15 MG PO TABS: 15.0000 mg | ORAL_TABLET | Freq: Every day | ORAL | 1 refills | Status: AC

## 2024-05-06 ENCOUNTER — Ambulatory Visit: Payer: Self-pay | Admitting: Medical-Surgical

## 2024-05-06 LAB — CMP14+EGFR
ALT: 22 IU/L (ref 0–32)
AST: 24 IU/L (ref 0–40)
Albumin: 4.6 g/dL (ref 3.9–4.9)
Alkaline Phosphatase: 69 IU/L (ref 49–135)
BUN/Creatinine Ratio: 19 (ref 12–28)
BUN: 14 mg/dL (ref 8–27)
Bilirubin Total: 0.6 mg/dL (ref 0.0–1.2)
CO2: 27 mmol/L (ref 20–29)
Calcium: 9.6 mg/dL (ref 8.7–10.3)
Chloride: 100 mmol/L (ref 96–106)
Creatinine, Ser: 0.74 mg/dL (ref 0.57–1.00)
Globulin, Total: 2.2 g/dL (ref 1.5–4.5)
Glucose: 91 mg/dL (ref 70–99)
Potassium: 4.2 mmol/L (ref 3.5–5.2)
Sodium: 141 mmol/L (ref 134–144)
Total Protein: 6.8 g/dL (ref 6.0–8.5)
eGFR: 89 mL/min/1.73 (ref >59)

## 2024-05-06 LAB — CBC WITH DIFFERENTIAL/PLATELET
Basophils Absolute: 0.1 x10E3/uL (ref 0.0–0.2)
Basos: 1 %
EOS (ABSOLUTE): 0.2 x10E3/uL (ref 0.0–0.4)
Eos: 5 %
Hematocrit: 41.3 % (ref 34.0–46.6)
Hemoglobin: 13.2 g/dL (ref 11.1–15.9)
Immature Grans (Abs): 0 x10E3/uL (ref 0.0–0.1)
Immature Granulocytes: 0 %
Lymphocytes Absolute: 1.5 x10E3/uL (ref 0.7–3.1)
Lymphs: 30 %
MCH: 29.6 pg (ref 26.6–33.0)
MCHC: 32 g/dL (ref 31.5–35.7)
MCV: 93 fL (ref 79–97)
Monocytes Absolute: 0.4 x10E3/uL (ref 0.1–0.9)
Monocytes: 8 %
Neutrophils Absolute: 2.7 x10E3/uL (ref 1.4–7.0)
Neutrophils: 56 %
Platelets: 244 x10E3/uL (ref 150–450)
RBC: 4.46 x10E6/uL (ref 3.77–5.28)
RDW: 12 % (ref 11.7–15.4)
WBC: 4.9 x10E3/uL (ref 3.4–10.8)

## 2024-05-06 LAB — LIPID PANEL
Chol/HDL Ratio: 2.3 ratio (ref 0.0–4.4)
Cholesterol, Total: 184 mg/dL (ref 100–199)
HDL: 79 mg/dL (ref >39)
LDL Chol Calc (NIH): 92 mg/dL (ref 0–99)
Triglycerides: 67 mg/dL (ref 0–149)
VLDL Cholesterol Cal: 13 mg/dL (ref 5–40)

## 2024-06-06 ENCOUNTER — Encounter: Payer: Self-pay | Admitting: Medical-Surgical

## 2024-06-06 ENCOUNTER — Ambulatory Visit: Payer: Self-pay

## 2024-06-06 NOTE — Telephone Encounter (Signed)
 FYI Only or Action Required?: FYI only for provider: appointment scheduled on 06/07/24.  Patient was last seen in primary care on 05/05/2024 by Willo Mini, NP.  Called Nurse Triage reporting Nasal Congestion.  Symptoms began several weeks ago.  Interventions attempted: Dietary changes.  Symptoms are: stable.  Triage Disposition: See PCP When Office is Open (Within 3 Days)  Patient/caregiver understands and will follow disposition?: Yes   Copied from CRM #[id number]. Topic: Clinical - Red Word Triage >> Jun 06, 2024 11:41 AM Wess RAMAN wrote: Red Word that prompted transfer to Nurse Triage: Week 3 of having a cold and it is now going to her chest. Feels like she is breathing in cold air when she is inside. Chest is doesn't feel normal >> Jun 06, 2024 12:06 PM Antwanette L wrote: Patient unable to remain on hold due to long wait time.She is requesting a callback at [phone] Reason for Disposition  [1] Sinus congestion (pressure, fullness) AND [2] present > 10 days  Answer Assessment - Initial Assessment Questions 1. ONSET: When did the nasal discharge start?      X 3 weeks 3. COUGH: Do you have a cough? If Yes, ask: Describe the color of your mucus. (e.g., clear, white, yellow, green)     Croupy barky cough, no sputum 4. RESPIRATORY DISTRESS: Describe your breathing.      Cold sensation when breathing in 5. FEVER: Do you have a fever? If Yes, ask: What is your temperature, how was it measured, and when did it start?     None 6. SEVERITY: Overall, how bad are you feeling right now? (e.g., doesn't interfere with normal activities, staying home from school/work, staying in bed)      Mild-Moderate 7. OTHER SYMPTOMS: Do you have any other symptoms? (e.g., earache, mouth sores, sore throat, wheezing)     Chest congestion  Protocols used: Common Cold-A-AH

## 2024-06-07 ENCOUNTER — Encounter: Payer: Self-pay | Admitting: Medical-Surgical

## 2024-06-07 ENCOUNTER — Telehealth: Admitting: Medical-Surgical

## 2024-06-07 DIAGNOSIS — J40 Bronchitis, not specified as acute or chronic: Secondary | ICD-10-CM | POA: Diagnosis not present

## 2024-06-07 DIAGNOSIS — J329 Chronic sinusitis, unspecified: Secondary | ICD-10-CM | POA: Diagnosis not present

## 2024-06-07 MED ORDER — METHYLPREDNISOLONE 4 MG PO TBPK: ORAL_TABLET | ORAL | 0 refills | Status: AC

## 2024-06-07 MED ORDER — IPRATROPIUM BROMIDE 0.03 % NA SOLN: 2.0000 | Freq: Two times a day (BID) | NASAL | 0 refills | Status: DC | PRN

## 2024-06-07 MED ORDER — CEFDINIR 300 MG PO CAPS: 300.0000 mg | ORAL_CAPSULE | Freq: Two times a day (BID) | ORAL | 0 refills | Status: AC

## 2024-06-07 NOTE — Progress Notes (Signed)
 Virtual Visit via Video Note  I connected with Joanna Willis on 06/07/2024 at  4:00 PM EST by a video enabled telemedicine application and verified that I am speaking with the correct person using two identifiers.   I discussed the limitations of evaluation and management by telemedicine and the availability of in person appointments. The patient expressed understanding and agreed to proceed.  Patient location: home Provider locations: office  Subjective:    Discussed the use of AI scribe software for clinical note transcription with the patient, who gave verbal consent to proceed.  History of Present Illness   Joanna Willis is a 68 year old female who presents with persistent sinus congestion and cough.  Upper respiratory symptoms - Sinus congestion for three weeks - Non-productive, croupy cough for three weeks - Persistent postnasal drip with sensation of drainage down the back of the throat, not relieved by blowing nose - Transient frontal facial pain on Sunday, now resolved - No fever or chills  Medication use and response - DayQuil, NyQuil, and Mucinex used without relief - Nasacort  has improved breathing  Medication allergies - Allergic to penicillin (rash) - Tolerates cephalosporins such as Omnicef       Past medical history, Surgical history, Family history not pertinant except as noted below, Social history, Allergies, and medications have been entered into the medical record, reviewed, and corrections made.   Review of Systems: See HPI for pertinent positives and negatives.   Objective:    General: Speaking clearly in complete sentences without any shortness of breath.  Alert and oriented x3.  Normal judgment. No apparent acute distress.  Impression and Recommendations:    Assessment and Plan    Sinobronchitis Sinus congestion, non-productive cough, and postnasal drip for three weeks. Symptoms primarily in sinuses. Previous medications provided limited relief.  Decision to initiate antibiotics due to prolonged symptoms. Discussed potential use of steroids and Atrovent  nasal spray. Allergy to penicillin noted, tolerates cephalosporins. - Prescribed Omnicef  300mg  BID x 7 days for sinusitis. - Prescribed Medrol  dose pack for inflammation. - Prescribed Atrovent  nasal spray for postnasal drip.    I discussed the assessment and treatment plan with the patient. The patient was provided an opportunity to ask questions and all were answered. The patient agreed with the plan and demonstrated an understanding of the instructions.   The patient was advised to call back or seek an in-person evaluation if the symptoms worsen or if the condition fails to improve as anticipated.  Return if symptoms worsen or fail to improve.  [name], DNP, APRN, FNP-BC [facility]

## 2024-06-07 NOTE — Telephone Encounter (Signed)
 Patient shows scheduled for video visit today 06/07/2024 with Joy Jessup, at 4pm

## 2024-06-29 ENCOUNTER — Other Ambulatory Visit: Payer: Self-pay | Admitting: Medical-Surgical

## 2024-07-20 ENCOUNTER — Other Ambulatory Visit: Payer: Self-pay | Admitting: Medical-Surgical

## 2025-05-11 ENCOUNTER — Encounter: Admitting: Medical-Surgical
